# Patient Record
Sex: Male | Born: 1935
Health system: Southern US, Community
[De-identification: ages and names within clinical notes are randomized; demographics above are authoritative.]

## PROBLEM LIST (undated history)

## (undated) DIAGNOSIS — D649 Anemia, unspecified: Secondary | ICD-10-CM

## (undated) DIAGNOSIS — M1990 Unspecified osteoarthritis, unspecified site: Secondary | ICD-10-CM

## (undated) DIAGNOSIS — I10 Essential (primary) hypertension: Secondary | ICD-10-CM

## (undated) DIAGNOSIS — K589 Irritable bowel syndrome without diarrhea: Secondary | ICD-10-CM

## (undated) DIAGNOSIS — I272 Pulmonary hypertension, unspecified: Secondary | ICD-10-CM

## (undated) DIAGNOSIS — N184 Chronic kidney disease, stage 4 (severe): Secondary | ICD-10-CM

## (undated) DIAGNOSIS — E7800 Pure hypercholesterolemia, unspecified: Secondary | ICD-10-CM

## (undated) DIAGNOSIS — J189 Pneumonia, unspecified organism: Secondary | ICD-10-CM

## (undated) DIAGNOSIS — N189 Chronic kidney disease, unspecified: Secondary | ICD-10-CM

## (undated) DIAGNOSIS — R001 Bradycardia, unspecified: Secondary | ICD-10-CM

## (undated) DIAGNOSIS — I739 Peripheral vascular disease, unspecified: Secondary | ICD-10-CM

## (undated) DIAGNOSIS — R55 Syncope and collapse: Secondary | ICD-10-CM

## (undated) DIAGNOSIS — C61 Malignant neoplasm of prostate: Secondary | ICD-10-CM

## (undated) DIAGNOSIS — K219 Gastro-esophageal reflux disease without esophagitis: Secondary | ICD-10-CM

## (undated) HISTORY — DX: Bradycardia, unspecified: R00.1

## (undated) HISTORY — PX: COLONOSCOPY: SHX174

## (undated) HISTORY — DX: Gastro-esophageal reflux disease without esophagitis: K21.9

## (undated) HISTORY — DX: Peripheral vascular disease, unspecified: I73.9

## (undated) HISTORY — PX: PROSTATE SURGERY: SHX751

## (undated) HISTORY — PX: PROSTATECTOMY: SHX69

## (undated) HISTORY — DX: Irritable bowel syndrome, unspecified: K58.9

## (undated) HISTORY — DX: Malignant neoplasm of prostate: C61

## (undated) HISTORY — DX: Chronic kidney disease, unspecified: N18.9

## (undated) HISTORY — DX: Anemia, unspecified: D64.9

## (undated) HISTORY — DX: Pulmonary hypertension, unspecified: I27.20

---

## 2005-11-18 ENCOUNTER — Ambulatory Visit: Payer: Self-pay | Admitting: Family Medicine

## 2007-05-25 ENCOUNTER — Ambulatory Visit: Payer: Self-pay | Admitting: Family Medicine

## 2010-02-07 ENCOUNTER — Ambulatory Visit: Payer: Self-pay | Admitting: Gastroenterology

## 2010-04-12 ENCOUNTER — Ambulatory Visit: Payer: Self-pay | Admitting: Family Medicine

## 2010-04-17 ENCOUNTER — Ambulatory Visit: Payer: Self-pay | Admitting: Family Medicine

## 2010-05-21 ENCOUNTER — Ambulatory Visit: Payer: Self-pay | Admitting: Gastroenterology

## 2011-12-12 ENCOUNTER — Other Ambulatory Visit: Payer: Self-pay

## 2011-12-12 ENCOUNTER — Emergency Department (INDEPENDENT_AMBULATORY_CARE_PROVIDER_SITE_OTHER): Payer: BC Managed Care – PPO

## 2011-12-12 ENCOUNTER — Emergency Department (INDEPENDENT_AMBULATORY_CARE_PROVIDER_SITE_OTHER)
Admission: EM | Admit: 2011-12-12 | Discharge: 2011-12-12 | Disposition: A | Discharge status: Home / Self Care | Payer: BC Managed Care – PPO | Source: Home / Self Care | Attending: Emergency Medicine | Admitting: Emergency Medicine

## 2011-12-12 ENCOUNTER — Encounter (HOSPITAL_COMMUNITY): Payer: Self-pay | Admitting: Emergency Medicine

## 2011-12-12 DIAGNOSIS — J189 Pneumonia, unspecified organism: Secondary | ICD-10-CM

## 2011-12-12 DIAGNOSIS — D649 Anemia, unspecified: Secondary | ICD-10-CM

## 2011-12-12 DIAGNOSIS — R55 Syncope and collapse: Secondary | ICD-10-CM

## 2011-12-12 DIAGNOSIS — N189 Chronic kidney disease, unspecified: Secondary | ICD-10-CM

## 2011-12-12 DIAGNOSIS — I498 Other specified cardiac arrhythmias: Secondary | ICD-10-CM

## 2011-12-12 DIAGNOSIS — R001 Bradycardia, unspecified: Secondary | ICD-10-CM

## 2011-12-12 HISTORY — DX: Pure hypercholesterolemia, unspecified: E78.00

## 2011-12-12 HISTORY — DX: Essential (primary) hypertension: I10

## 2011-12-12 LAB — POCT I-STAT, CHEM 8
BUN: 38 mg/dL — ABNORMAL HIGH (ref 6–23)
Calcium, Ion: 1.2 mmol/L (ref 1.12–1.32)
TCO2: 23 mmol/L (ref 0–100)

## 2011-12-12 MED ORDER — BENZONATATE 200 MG PO CAPS: 200.0000 mg | ORAL_CAPSULE | Freq: Three times a day (TID) | ORAL | Status: AC | PRN

## 2011-12-12 MED ORDER — CEFTRIAXONE SODIUM 1 G IJ SOLR
INTRAMUSCULAR | Status: AC
  Filled 2011-12-12: qty 10

## 2011-12-12 MED ORDER — AMOXICILLIN-POT CLAVULANATE 875-125 MG PO TABS: 1.0000 | ORAL_TABLET | Freq: Two times a day (BID) | ORAL | Status: AC

## 2011-12-12 MED ORDER — CEFTRIAXONE SODIUM 1 G IJ SOLR
1.0000 g | Freq: Once | INTRAMUSCULAR | Status: AC
  Administered 2011-12-12: 1 g via INTRAMUSCULAR

## 2011-12-12 MED ORDER — LIDOCAINE HCL (PF) 1 % IJ SOLN
INTRAMUSCULAR | Status: AC
  Filled 2011-12-12: qty 5

## 2011-12-12 NOTE — ED Notes (Signed)
Patient aware of post injection delay prior to being discharged, evaluation for any reaction

## 2011-12-12 NOTE — ED Notes (Signed)
C/o congestion in chest, throat, no sore throat, congestion is clear, today has been blood tinge.  In am has had yellow phlegm, then clears throughout the day.  No fever, maybe Saturday and sunday

## 2011-12-12 NOTE — Discharge Instructions (Signed)

## 2011-12-12 NOTE — ED Provider Notes (Signed)
Chief Complaint  Patient presents with  . URI    History of Present Illness:   Mr. Medendorp is a 78 year old male with high blood pressure and hyperlipidemia who has had a one-week history of cough productive of yellow sputum, rattling in his chest, wheezing, fever, chills, nasal congestion, rhinorrhea, sneezing, and yellowish nasal drainage. He has some nausea and vomiting over the weekend and actually on Sunday, 4 days ago, he passed out briefly just for a few seconds. He felt dizzy beforehand. He did not hurt himself when he fell. He denies any other prior history of syncope or presyncope. He denied any accompanying chest pain, tightness, pressure, palpitations, or shortness of breath. He denies any chest pain or difficulty breathing right now. He has no pleuritic chest pain. No prior history of pneumonia.  Review of Systems:  Other than noted above, the patient denies any of the following symptoms. Systemic:  No fever, chills, sweats, fatigue, myalgias, headache, or anorexia. Eye:  No redness, pain or drainage. ENT:  No earache, nasal congestion, rhinorrhea, sinus pressure, or sore throat. Lungs:  No cough, sputum production, wheezing, shortness of breath. Or chest pain. GI:  No nausea, vomiting, abdominal pain or diarrhea. Skin:  No rash or itching.  [place]:  Past medical history, family history, social history, meds, and allergies were reviewed.  Physical Exam:   Vital signs:  BP 115/55  Pulse 64  Temp(Src) 98.4 F (36.9 C) (Oral)  Resp 16  SpO2 99% General:  Alert, in no distress. Eye:  No conjunctival injection or drainage. ENT:  TMs and canals were normal, without erythema or inflammation.  Nasal mucosa was clear and uncongested, without drainage.  Mucous membranes were moist.  Pharynx was clear, without exudate or drainage.  There were no oral ulcerations or lesions. Neck:  Supple, no adenopathy, tenderness or mass. Lungs:  No respiratory distress.  Lungs were clear to  auscultation, without wheezes, rales or rhonchi.  Breath sounds were clear and equal bilaterally. Heart:  Regular rhythm, without gallops, murmers or rubs. Skin:  Clear, warm, and dry, without rash or lesions.  Labs:   Results for orders placed during the hospital encounter of 12/12/11  POCT I-STAT, CHEM 8      Component Value Range   Sodium 141  135 - 145 (mEq/L)   Potassium 4.0  3.5 - 5.1 (mEq/L)   Chloride 110  96 - 112 (mEq/L)   BUN 38 (*) 6 - 23 (mg/dL)   Creatinine, Ser 2.00 (*) 0.50 - 1.35 (mg/dL)   Glucose, Bld 138 (*) 70 - 99 (mg/dL)   Calcium, Ion 1.20  1.12 - 1.32 (mmol/L)   TCO2 23  0 - 100 (mmol/L)   Hemoglobin 10.5 (*) 13.0 - 17.0 (g/dL)   HCT 31.0 (*) 39.0 - 52.0 (%)    Date: 12/12/2011  Rate: 47  Rhythm: sinus bradycardia  QRS Axis: normal  Intervals: normal  ST/T Wave abnormalities: normal  Conduction Disutrbances:none  Narrative Interpretation: Sinus bradycardia, otherwise normal EKG.  Old EKG Reviewed: none available    Radiology:  Dg Chest 2 View  12/12/2011  *RADIOLOGY REPORT*  Clinical Data: Cough for 1 week and nonsmoker  CHEST - 2 VIEW  Comparison: None.  Findings: The lung fields are mildly hyperexpanded with flattening of the hemidiaphragms and increased retrosternal air space suggesting some degree of obstructive pulmonary disease.  Taking this into consideration heart size is mildly enlarged.  A normal mediastinal configuration is seen.  The lung fields are notable  for an area of alveolar infiltrate involving the superior segment of the left lower lobe.  The finding is most suspicious for an area of focal bronchopneumonia but given the somewhat rounded nature on the lateral film, follow-up post therapeutically is recommended to assess for resolution.  Remainder of the lung fields are clear.  No pleural fluid is seen.   Bony structures appear intact.  IMPRESSION: Findings most compatible with a superior segment left lower lobe bronchopneumonia.  Follow-up to  clearing is recommended as noted above.  Underlying COPD suggested.  These results will be called to the ordering clinician or representative by the Radiologist Assistant, and communication documented in the PACS Dashboard.  Original Report Authenticated By: Ander Gaster, M.D.    Assessment:  The primary encounter diagnosis was Community acquired pneumonia. Diagnoses of Syncope, Bradycardia, Chronic kidney disease, and Anemia were also pertinent to this visit.  Plan:   1.  The following meds were prescribed:   New Prescriptions   AMOXICILLIN-CLAVULANATE (AUGMENTIN) 875-125 MG PER TABLET    Take 1 tablet by mouth 2 (two) times daily.   BENZONATATE (TESSALON) 200 MG CAPSULE    Take 1 capsule (200 mg total) by mouth 3 (three) times daily as needed for cough.   2.  The patient was instructed in symptomatic care and handouts were given. 3.  The patient was told to return if becoming worse in any way, if no better in 3 or 4 days, and given some red flag symptoms that would indicate earlier return.  Follow up:  The patient was told to follow up with his primary care physician next week. No work until then. I suggested that he will want to have a repeat chest x-ray in 2-4 weeks to confirm clearing of the pneumonia.    Harden Mo, MD 12/12/11 [phone]

## 2012-06-29 DIAGNOSIS — C61 Malignant neoplasm of prostate: Secondary | ICD-10-CM | POA: Insufficient documentation

## 2012-06-29 DIAGNOSIS — N393 Stress incontinence (female) (male): Secondary | ICD-10-CM | POA: Insufficient documentation

## 2012-06-29 DIAGNOSIS — N529 Male erectile dysfunction, unspecified: Secondary | ICD-10-CM | POA: Insufficient documentation

## 2012-07-06 ENCOUNTER — Other Ambulatory Visit: Payer: Self-pay | Admitting: Orthopedic Surgery

## 2012-07-06 DIAGNOSIS — M4800 Spinal stenosis, site unspecified: Secondary | ICD-10-CM

## 2012-08-07 ENCOUNTER — Other Ambulatory Visit: Payer: BC Managed Care – PPO

## 2012-08-26 ENCOUNTER — Ambulatory Visit
Admission: RE | Admit: 2012-08-26 | Discharge: 2012-08-26 | Disposition: A | Discharge status: Home / Self Care | Payer: Medicare Other | Source: Ambulatory Visit | Attending: Orthopedic Surgery | Admitting: Orthopedic Surgery

## 2012-08-26 VITALS — BP 157/72 | HR 46 | Ht 74.0 in | Wt 219.0 lb

## 2012-08-26 DIAGNOSIS — M4800 Spinal stenosis, site unspecified: Secondary | ICD-10-CM

## 2012-08-26 MED ORDER — IOHEXOL 180 MG/ML  SOLN
15.0000 mL | Freq: Once | INTRAMUSCULAR | Status: AC | PRN
  Administered 2012-08-26: 15 mL via INTRATHECAL

## 2012-08-26 MED ORDER — DIAZEPAM 5 MG PO TABS
5.0000 mg | ORAL_TABLET | Freq: Once | ORAL | Status: AC
  Administered 2012-08-26: 5 mg via ORAL

## 2012-10-29 ENCOUNTER — Other Ambulatory Visit: Payer: Self-pay | Admitting: Family Medicine

## 2012-11-09 ENCOUNTER — Ambulatory Visit
Admission: RE | Admit: 2012-11-09 | Discharge: 2012-11-09 | Disposition: A | Discharge status: Home / Self Care | Payer: Medicare Other | Source: Ambulatory Visit | Attending: Family Medicine | Admitting: Family Medicine

## 2013-05-07 ENCOUNTER — Ambulatory Visit: Payer: Self-pay | Admitting: Family Medicine

## 2013-06-29 ENCOUNTER — Ambulatory Visit: Payer: Medicare Other

## 2013-08-27 ENCOUNTER — Ambulatory Visit: Payer: Self-pay

## 2014-07-29 ENCOUNTER — Encounter: Payer: Self-pay | Admitting: Cardiology

## 2014-07-29 ENCOUNTER — Ambulatory Visit (INDEPENDENT_AMBULATORY_CARE_PROVIDER_SITE_OTHER): Payer: Medicare Other | Admitting: Cardiology

## 2014-07-29 VITALS — BP 140/90 | HR 52 | Ht 74.0 in | Wt 210.4 lb

## 2014-07-29 DIAGNOSIS — R001 Bradycardia, unspecified: Secondary | ICD-10-CM | POA: Insufficient documentation

## 2014-07-29 DIAGNOSIS — I739 Peripheral vascular disease, unspecified: Secondary | ICD-10-CM | POA: Insufficient documentation

## 2014-07-29 DIAGNOSIS — D638 Anemia in other chronic diseases classified elsewhere: Secondary | ICD-10-CM | POA: Insufficient documentation

## 2014-07-29 DIAGNOSIS — K589 Irritable bowel syndrome without diarrhea: Secondary | ICD-10-CM | POA: Insufficient documentation

## 2014-07-29 DIAGNOSIS — I10 Essential (primary) hypertension: Secondary | ICD-10-CM

## 2014-07-29 DIAGNOSIS — E785 Hyperlipidemia, unspecified: Secondary | ICD-10-CM

## 2014-07-29 DIAGNOSIS — D649 Anemia, unspecified: Secondary | ICD-10-CM | POA: Insufficient documentation

## 2014-07-29 DIAGNOSIS — K219 Gastro-esophageal reflux disease without esophagitis: Secondary | ICD-10-CM | POA: Insufficient documentation

## 2014-07-29 DIAGNOSIS — R9431 Abnormal electrocardiogram [ECG] [EKG]: Secondary | ICD-10-CM

## 2014-07-29 MED ORDER — HYDRALAZINE HCL 25 MG PO TABS: 25.0000 mg | ORAL_TABLET | Freq: Three times a day (TID) | ORAL | Status: DC

## 2014-07-29 NOTE — Addendum Note (Signed)
Addended by: Fransico Him R on: 07/29/2014 11:57 AM   Modules accepted: Level of Service

## 2014-07-29 NOTE — Progress Notes (Signed)
[place], [place],   [address] Phone: [phone] Fax:  [phone]  Date:  07/29/2014   ID:  Patrick Wisely., DOB 11-Sep-1931, MRN [id number]  PCP:  Christianne Borrow, MD  Cardiologist:  Fransico Him, MD    History of Present Illness: Patrick [name]. is a 80 y.o. male with a history of HTN, PVD, CKD, GERD and dyslipidemia who was referred by his PCP for evaluation of difficult to control HTN.  He denies any chest pain, SOB, DOE, LE edema, dizziness, palpitations or syncope.   Wt Readings from Last 3 Encounters:  07/29/14 210 lb 6.4 oz (95.437 kg)  08/26/12 219 lb (99.338 kg)     Past Medical History  Diagnosis Date  . Hypertension   . High cholesterol   . Prostate cancer   . PVD (peripheral vascular disease)   . Chronic kidney disease   . GERD (gastroesophageal reflux disease)   . Anemia   . IBS (irritable bowel syndrome)   . Bradycardia     Current Outpatient Prescriptions  Medication Sig Dispense Refill  . amLODipine (NORVASC) 10 MG tablet Take 10 mg by mouth daily.    . hydrALAZINE (APRESOLINE) 10 MG tablet Take 10 mg by mouth 3 (three) times daily.    [name] Kitchen losartan (COZAAR) 100 MG tablet Take 100 mg by mouth daily.    [name] Kitchen omeprazole (PRILOSEC) 40 MG capsule Take 40 mg by mouth daily. [place]    . OVER THE COUNTER MEDICATION [place] MAN VITAMIN B  AND D3    . pravastatin (PRAVACHOL) 40 MG tablet Take 40 mg by mouth daily.    . vitamin C (ASCORBIC ACID) 500 MG tablet Take 500 mg by mouth daily.    . Vitamin E 400 UNITS CHEW Chew by mouth.     No current facility-administered medications for this visit.    Allergies:   No Known Allergies  Social History:  The patient  reports that he quit smoking about 35 years ago. His smoking use included Cigarettes. He has a 6 pack-year smoking history. He has never used smokeless tobacco. He reports that he does not drink alcohol or use illicit drugs.   Family History:  The patient's  family history includes CVA in his mother; Heart attack in his father; Heart disease in his father; Hepatitis C in his sister.   ROS:  Please see the history of present illness.      All other systems reviewed and negative.   PHYSICAL EXAM: VS:  BP 140/90 mmHg  Pulse 52  Ht 6\' 2"  (1.88 m)  Wt 210 lb 6.4 oz (95.437 kg)  BMI 27.00 kg/m2 Well nourished, well developed, in no acute distress HEENT: normal Neck: no JVD Cardiac:  normal S1, S2; RRR; no murmur Lungs:  clear to auscultation bilaterally, no wheezing, rhonchi or rales Abd: soft, nontender, no hepatomegaly Ext: no edema Skin: warm and dry Neuro:  CNs 2-12 intact, no focal abnormalities noted  EKG:  Sinus bradycardia at 52bpm with T wave inversions in V1-V3     ASSESSMENT AND PLAN:  1. Abnormal EKG with T wave changes in anteoseptal leads - Stress myoview to rule out ischemia - 2D echo to assess LVF 2. HTN with borderline control - continue amlodipine/Cozaar - increase Hydralazine to 25mg  TID - I have asked him to check his BP daily for a week and call with results - I have counseled him on a low sodium diet  3. Dyslipidemia - continue pravastatin 4. PVD 5. CKD - his last creatinine was 1.9 - will leave up to PCP but may need to change this to something else.  Followup with PA in 2 weeks  Signed, Fransico Him, MD [facility] 07/29/2014 10:17 AM

## 2014-07-29 NOTE — Patient Instructions (Addendum)
Your physician has requested that you have an echocardiogram. Echocardiography is a painless test that uses sound waves to create images of your heart. It provides your doctor with information about the size and shape of your heart and how well your heart's chambers and valves are working. This procedure takes approximately one hour. There are no restrictions for this procedure.  Your physician has recommended you make the following change in your medication:  1) INCREASE Hydralazine to 25 mg THREE TIMES A DAY  Please check your blood pressure daily for one week and call us with results. [id number].  Dr. Radford Pax recommends having a Stress Myoview.  You have an appointment with a nurse on November 27th to check your blood pressure.  Your physician recommends that you schedule a follow-up appointment in: 3 months with Dr. Radford Pax.   Low-Sodium Eating Plan Sodium raises blood pressure and causes water to be held in the body. Getting less sodium from food will help lower your blood pressure, reduce any swelling, and protect your heart, liver, and kidneys. We get sodium by adding salt (sodium chloride) to food. Most of our sodium comes from canned, boxed, and frozen foods. Restaurant foods, fast foods, and pizza are also very high in sodium. Even if you take medicine to lower your blood pressure or to reduce fluid in your body, getting less sodium from your food is important. WHAT DO I NEED TO KNOW ABOUT THIS EATING PLAN? For the low-sodium eating plan, you will follow these general guidelines:  Choose foods with a % Daily Value for sodium of less than 5% (as listed on the food label).   Use salt-free seasonings or herbs instead of table salt or sea salt.   Check with your health care provider or pharmacist before using salt substitutes.   Eat fresh foods.  Eat more vegetables and fruits.  Limit canned vegetables. If you do use them, rinse them well to decrease the sodium.   Limit cheese to  1 oz (28 g) per day.   Eat lower-sodium products, often labeled as "lower sodium" or "no salt added."  Avoid foods that contain monosodium glutamate (MSG). MSG is sometimes added to [place] food and some canned foods.  Check food labels (Nutrition Facts labels) on foods to learn how much sodium is in one serving.  Eat more home-cooked food and less restaurant, buffet, and fast food.  When eating at a restaurant, ask that your food be prepared with less salt or none, if possible.  HOW DO I READ FOOD LABELS FOR SODIUM INFORMATION? The Nutrition Facts label lists the amount of sodium in one serving of the food. If you eat more than one serving, you must multiply the listed amount of sodium by the number of servings. Food labels may also identify foods as:  Sodium free--Less than 5 mg in a serving.  Very low sodium--35 mg or less in a serving.  Low sodium--140 mg or less in a serving.  Light in sodium--50% less sodium in a serving. For example, if a food that usually has 300 mg of sodium is changed to become light in sodium, it will have 150 mg of sodium.  Reduced sodium--25% less sodium in a serving. For example, if a food that usually has 400 mg of sodium is changed to reduced sodium, it will have 300 mg of sodium. WHAT FOODS CAN I EAT? Grains Low-sodium cereals, including oats, puffed wheat and rice, and shredded wheat cereals. Low-sodium crackers. Unsalted rice and pasta. Lower-sodium  bread.  Vegetables Frozen or fresh vegetables. Low-sodium or reduced-sodium canned vegetables. Low-sodium or reduced-sodium tomato sauce and paste. Low-sodium or reduced-sodium tomato and vegetable juices.  Fruits Fresh, frozen, and canned fruit. Fruit juice.  Meat and Other Protein Products Low-sodium canned tuna and salmon. Fresh or frozen meat, poultry, seafood, and fish. Lamb. Unsalted nuts. Dried beans, peas, and lentils without added salt. Unsalted canned beans. Homemade soups without  salt. Eggs.  Dairy Milk. Soy milk. Ricotta cheese. Low-sodium or reduced-sodium cheeses. Yogurt.  Condiments Fresh and dried herbs and spices. Salt-free seasonings. Onion and garlic powders. Low-sodium varieties of mustard and ketchup. Lemon juice.  Fats and Oils Reduced-sodium salad dressings. Unsalted butter.  Other Unsalted popcorn and pretzels.  The items listed above may not be a complete list of recommended foods or beverages. Contact your dietitian for more options. WHAT FOODS ARE NOT RECOMMENDED? Grains Instant hot cereals. Bread stuffing, pancake, and biscuit mixes. Croutons. Seasoned rice or pasta mixes. Noodle soup cups. Boxed or frozen macaroni and cheese. Self-rising flour. Regular salted crackers. Vegetables Regular canned vegetables. Regular canned tomato sauce and paste. Regular tomato and vegetable juices. Frozen vegetables in sauces. Salted french fries. Olives. Angie Fava. Relishes. Sauerkraut. Salsa. Meat and Other Protein Products Salted, canned, smoked, spiced, or pickled meats, seafood, or fish. Bacon, ham, sausage, hot dogs, corned beef, chipped beef, and packaged luncheon meats. Salt pork. Jerky. Pickled herring. Anchovies, regular canned tuna, and sardines. Salted nuts. Dairy Processed cheese and cheese spreads. Cheese curds. Blue cheese and cottage cheese. Buttermilk.  Condiments Onion and garlic salt, seasoned salt, table salt, and sea salt. Canned and packaged gravies. Worcestershire sauce. Tartar sauce. Barbecue sauce. Teriyaki sauce. Soy sauce, including reduced sodium. Steak sauce. Fish sauce. Oyster sauce. Cocktail sauce. Horseradish. Regular ketchup and mustard. Meat flavorings and tenderizers. Bouillon cubes. Hot sauce. Tabasco sauce. Marinades. Taco seasonings. Relishes. Fats and Oils Regular salad dressings. Salted butter. Margarine. Ghee. Bacon fat.  Other Potato and tortilla chips. Corn chips and puffs. Salted popcorn and pretzels. Canned or  dried soups. Pizza. Frozen entrees and pot pies.  The items listed above may not be a complete list of foods and beverages to avoid. Contact your dietitian for more information. Document Released: 02/15/2002 Document Revised: 08/31/2013 Document Reviewed: 06/30/2013 [facility] Patient Information 2015 [place], [place]. This information is not intended to replace advice given to you by your health care provider. Make sure you discuss any questions you have with your health care provider.

## 2014-08-15 ENCOUNTER — Ambulatory Visit (HOSPITAL_COMMUNITY): Payer: Medicare Other | Attending: Cardiology | Admitting: Radiology

## 2014-08-15 ENCOUNTER — Ambulatory Visit (HOSPITAL_BASED_OUTPATIENT_CLINIC_OR_DEPARTMENT_OTHER): Payer: Medicare Other

## 2014-08-15 DIAGNOSIS — Z6826 Body mass index (BMI) 26.0-26.9, adult: Secondary | ICD-10-CM | POA: Insufficient documentation

## 2014-08-15 DIAGNOSIS — I10 Essential (primary) hypertension: Secondary | ICD-10-CM | POA: Insufficient documentation

## 2014-08-15 DIAGNOSIS — R9431 Abnormal electrocardiogram [ECG] [EKG]: Secondary | ICD-10-CM | POA: Diagnosis not present

## 2014-08-15 MED ORDER — TECHNETIUM TC 99M SESTAMIBI GENERIC - CARDIOLITE
10.0000 | Freq: Once | INTRAVENOUS | Status: AC | PRN
  Administered 2014-08-15: 10 via INTRAVENOUS

## 2014-08-15 MED ORDER — TECHNETIUM TC 99M SESTAMIBI GENERIC - CARDIOLITE
30.0000 | Freq: Once | INTRAVENOUS | Status: AC | PRN
  Administered 2014-08-15: 30 via INTRAVENOUS

## 2014-08-15 NOTE — Progress Notes (Signed)
[place] [facility] NUCLEAR MED [address] [place], [place] [address] [phone]    Cardiology Nuclear Med [facility] P Cope Benzon. is a 80 y.o. male     MRN : [id number]     DOB: 29-Sep-1931  Procedure Date: 08/15/2014  Nuclear Med Background Indication for Stress Test:  Evaluation for Ischemia and Abnormal EKG History:  no prior cardiac history or testing Cardiac Risk Factors: Hypertension  Symptoms:  None indicated   Nuclear Pre-Procedure Caffeine/Decaff Intake:  None NPO After: 6:00pm   Lungs:  clear O2 Sat: 97% on room air. IV 0.9% NS with Angio Cath:  22g  IV Site: R Hand  IV Started by:  Earl Many, CNMT  Chest Size (in):  48 Cup Size: n/a  Height: 6\' 2"  (1.88 m)  Weight:  210 lb (95.255 kg)  BMI:  Body mass index is 26.95 kg/(m^2). Tech Comments:  n/a    Nuclear Med Study 1 or 2 day study: 1 day  Stress Test Type:  Stress  Reading MD: Darlin Coco, MD  Order Authorizing Provider:  T. Turner, MD  Resting Radionuclide: Technetium 56m Sestamibi  Resting Radionuclide Dose: 11.0 mCi   Stress Radionuclide:  Technetium 18m Sestamibi  Stress Radionuclide Dose: 33.0 mCi           Stress Protocol Rest HR: 91 Stress HR: 146  Rest BP: 142/98 Stress BP: 213/90  Exercise Time (min): 6:00 METS: 4.6   Predicted Max HR: 140 bpm % Max HR: 104.29 bpm Rate Pressure Product: 31098   Dose of Adenosine (mg):  n/a Dose of Lexiscan: n/a mg  Dose of Atropine (mg): n/a Dose of Dobutamine: n/a mcg/kg/min (at max HR)  Stress Test Technologist: Glade Lloyd, BS-ES  Nuclear Technologist:  Annye Rusk, CNMT     Rest Procedure:  Myocardial perfusion imaging was performed at rest 45 minutes following the intravenous administration of Technetium 72m Sestamibi. Rest ECG: NSR - Normal EKG  Stress Procedure:  The patient exercised on the treadmill utilizing the Bruce Protocol for 6:00 minutes. The patient stopped due to fatigue and denied any chest pain.  Technetium  40m Sestamibi was injected at peak exercise and myocardial perfusion imaging was performed after a brief delay. Stress ECG: No significant change from baseline ECG  QPS Raw Data Images:  Normal; no motion artifact; normal heart/lung ratio. Stress Images:  Normal homogeneous uptake in all areas of the myocardium. Rest Images:  Normal homogeneous uptake in all areas of the myocardium. Subtraction (SDS):  No evidence of ischemia. Transient Ischemic Dilatation (Normal <1.22):  0.93 Lung/Heart Ratio (Normal <0.45):  0.26  Quantitative Gated Spect Images QGS EDV:  119 ml QGS ESV:  52 ml  Impression Exercise Capacity:  Good exercise capacity. BP Response:  Hypertensive blood pressure response. Clinical Symptoms:  No significant symptoms noted. ECG Impression:  No significant ST segment change suggestive of ischemia. Comparison with Prior Nuclear Study: No previous nuclear study performed  Overall Impression:  Low risk stress nuclear study. Hypertensive response to exercise. No ischemia or scar. No EKG changes of ischemia. Normal LV systolic function.  LV Ejection Fraction: 56%.  LV Wall Motion:  NL LV Function; NL Wall Motion  Darlin Coco MD

## 2014-08-15 NOTE — Progress Notes (Signed)
2D Echo completed. 08/15/2014

## 2014-08-16 ENCOUNTER — Telehealth: Payer: Self-pay | Admitting: Cardiology

## 2014-08-16 NOTE — Telephone Encounter (Signed)
Patient had elevated BP at time of stress test with BP ranging from 169-211/90's - please find out if patient took his BP meds the am of his stress test

## 2014-08-18 NOTE — Telephone Encounter (Signed)
Pt wife st he took all his medications before his stress test. She st that today, his BP was 141/81. She st he might have been nervous before the test.   To Dr. Radford Pax for review.

## 2014-08-19 NOTE — Telephone Encounter (Signed)
Instructed to check BP daily for a week and call with results.  Pt's wife agrees with treatment plan.

## 2014-08-19 NOTE — Telephone Encounter (Signed)
Please have patient check BP daily for a week and call with results 

## 2014-08-26 ENCOUNTER — Telehealth: Payer: Self-pay | Admitting: Cardiology

## 2014-08-26 NOTE — Telephone Encounter (Signed)
WAS TOLD BY KATIE TO CALL WITH BP READING FROM THE LAST 5 DAYS, THEY ARE AS FOLLOWS 12-11 160/97 12-12 165/88 12-13 111/73 12-14 155/81 12-15 169/87 12-16 148/83 12-17 157/87 12-18 135/84

## 2014-08-26 NOTE — Telephone Encounter (Signed)
To Dr. Turner for review and recommendations. 

## 2014-08-30 MED ORDER — HYDRALAZINE HCL 25 MG PO TABS: 37.5000 mg | ORAL_TABLET | Freq: Three times a day (TID) | ORAL | Status: DC

## 2014-08-30 NOTE — Telephone Encounter (Signed)
BP still on the high side - Increase Hydralazine to 37.5mg  TID and check BP daily for a week and call with results.

## 2014-08-30 NOTE — Telephone Encounter (Signed)
With patient's permission, spoke to his wife about BP and new medication instructions. Instructed her to INCREASE hydralazine to 37.5 mg TID and to check BP daily for a week and call with results. She and patient agree with treatment plan.

## 2014-10-31 ENCOUNTER — Encounter: Payer: Self-pay | Admitting: Cardiology

## 2014-10-31 ENCOUNTER — Ambulatory Visit (INDEPENDENT_AMBULATORY_CARE_PROVIDER_SITE_OTHER): Payer: Medicare HMO | Admitting: Cardiology

## 2014-10-31 VITALS — BP 138/72 | HR 70 | Ht 74.0 in | Wt 209.8 lb

## 2014-10-31 DIAGNOSIS — I10 Essential (primary) hypertension: Secondary | ICD-10-CM | POA: Diagnosis not present

## 2014-10-31 DIAGNOSIS — I272 Pulmonary hypertension, unspecified: Secondary | ICD-10-CM

## 2014-10-31 DIAGNOSIS — I270 Primary pulmonary hypertension: Secondary | ICD-10-CM | POA: Diagnosis not present

## 2014-10-31 DIAGNOSIS — N189 Chronic kidney disease, unspecified: Secondary | ICD-10-CM

## 2014-10-31 NOTE — Patient Instructions (Addendum)
Your physician has recommended that you have a pulmonary function test. Pulmonary Function Tests are a group of tests that measure how well air moves in and out of your lungs.  Dr. Radford Pax recommends you have a VQ Scan. A chest X-ray has also been ordered.  Your physician wants you to follow-up in: 6 months with Dr. Radford Pax. You will receive a reminder letter in the mail two months in advance. If you don't receive a letter, please call our office to schedule the follow-up appointment.

## 2014-10-31 NOTE — Progress Notes (Signed)
Cardiology Office Note   Date:  10/31/2014   ID:  Patrick Egleston., DOB July 11, 1932, MRN [id number]  PCP:  Christianne Borrow, MD  Cardiologist:   Sueanne Margarita, MD   Chief Complaint  Patient presents with  . Hypertension      History of Present Illness: Patrick Burruel. is a 81 y.o. male with a history of HTN, PVD, CKD, GERD and dyslipidemia who presents for followup of HTN. He denies any chest pain, SOB, DOE, LE edema, dizziness, palpitations or syncope.   Past Medical History  Diagnosis Date  . Hypertension   . High cholesterol   . Prostate cancer   . PVD (peripheral vascular disease)   . Chronic kidney disease   . GERD (gastroesophageal reflux disease)   . Anemia   . IBS (irritable bowel syndrome)   . Bradycardia     Past Surgical History  Procedure Laterality Date  . Prostatectomy       Current Outpatient Prescriptions  Medication Sig Dispense Refill  . amLODipine (NORVASC) 10 MG tablet Take 10 mg by mouth daily.    . hydrALAZINE (APRESOLINE) 25 MG tablet Take 1.5 tablets (37.5 mg total) by mouth 3 (three) times daily. 135 tablet 6  . losartan (COZAAR) 100 MG tablet Take 100 mg by mouth daily.    [name] Kitchen MAGNESIUM PO Take 1 capsule by mouth daily.    . pravastatin (PRAVACHOL) 40 MG tablet Take 40 mg by mouth daily.    . vitamin C (ASCORBIC ACID) 500 MG tablet Take 500 mg by mouth daily.    . Vitamin E 400 UNITS CHEW Chew 1 tablet by mouth daily.      No current facility-administered medications for this visit.    Allergies:   Review of patient's allergies indicates no known allergies.    Social History:  The patient  reports that he quit smoking about 36 years ago. His smoking use included Cigarettes. He has a 6 pack-year smoking history. He has never used smokeless tobacco. He reports that he does not drink alcohol or use illicit drugs.   Family History:  The patient's family history includes CVA in his mother; Heart attack in his father; Heart  disease in his father; Hepatitis C in his sister.    ROS:  Please see the history of present illness.   Otherwise, review of systems are positive for none.   All other systems are reviewed and negative.    PHYSICAL EXAM: VS:  BP 138/72 mmHg  Pulse 70  Ht 6\' 2"  (1.88 m)  Wt 209 lb 12.8 oz (95.165 kg)  BMI 26.93 kg/m2  SpO2 98% , BMI Body mass index is 26.93 kg/(m^2). GEN: Well nourished, well developed, in no acute distress HEENT: normal Neck: no JVD, carotid bruits, or masses Cardiac: RRR; no murmurs, rubs, or gallops,no edema  Respiratory:  clear to auscultation bilaterally, normal work of breathing GI: soft, nontender, nondistended, + BS MS: no deformity or atrophy Skin: warm and dry, no rash Neuro:  Strength and sensation are intact Psych: euthymic mood, full affect   EKG:  EKG is not ordered today.    Recent Labs: No results found for requested labs within last 365 days.    Lipid Panel No results found for: CHOL, TRIG, HDL, CHOLHDL, VLDL, LDLCALC, LDLDIRECT    Wt Readings from Last 3 Encounters:  10/31/14 209 lb 12.8 oz (95.165 kg)  08/15/14 210 lb (95.255 kg)  07/29/14 210 lb 6.4 oz (95.437 kg)  ASSESSMENT AND PLAN:  1. Abnormal EKG with T wave changes in anteoseptal leads - no ischemia on nuclear stress test and echo with normal LVF 2. HTN well controlled - continue amlodipine/Cozaar/hydralazine 3. Dyslipidemia - continue pravastatin 4. PVD 5. CKD - his last creatinine was 1.9 - will leave up to PCP but may need to change this to something else. 6. Pulmonary HTN - PFTs will get a VQ scan (pt has renal insuff)  to rule out chronic PE and PFTs with DLCO.  If these are ok then will get a sleep study.     Current medicines are reviewed at length with the patient today.  The patient does not have concerns regarding medicines.  The following changes have been made:  no change  Labs/ tests ordered today include: VQ scan and PFTs with DLCO      Disposition:   FU with me in 6 months   Signed, Sueanne Margarita, MD  10/31/2014 8:43 AM    [facility] [address], [place], [place]  [address] Phone: [phone]; Fax: [phone]

## 2014-11-02 ENCOUNTER — Ambulatory Visit (HOSPITAL_COMMUNITY)
Admission: RE | Admit: 2014-11-02 | Discharge: 2014-11-02 | Disposition: A | Discharge status: Home / Self Care | Payer: Medicare HMO | Source: Ambulatory Visit | Attending: Cardiology | Admitting: Cardiology

## 2014-11-02 ENCOUNTER — Inpatient Hospital Stay (HOSPITAL_COMMUNITY): Admission: RE | Admit: 2014-11-02 | Payer: Medicare Other | Source: Ambulatory Visit

## 2014-11-02 DIAGNOSIS — N189 Chronic kidney disease, unspecified: Secondary | ICD-10-CM | POA: Diagnosis present

## 2014-11-02 DIAGNOSIS — I272 Pulmonary hypertension, unspecified: Secondary | ICD-10-CM

## 2014-11-02 DIAGNOSIS — I270 Primary pulmonary hypertension: Secondary | ICD-10-CM | POA: Insufficient documentation

## 2014-11-02 MED ORDER — TECHNETIUM TO 99M ALBUMIN AGGREGATED
6.0000 | Freq: Once | INTRAVENOUS | Status: AC | PRN
  Administered 2014-11-02: 6 via INTRAVENOUS

## 2014-11-02 MED ORDER — TECHNETIUM TC 99M DIETHYLENETRIAME-PENTAACETIC ACID: 40.0000 | Freq: Once | INTRAVENOUS | Status: AC | PRN

## 2014-11-18 ENCOUNTER — Other Ambulatory Visit: Payer: Self-pay

## 2014-11-18 MED ORDER — HYDRALAZINE HCL 25 MG PO TABS: 37.5000 mg | ORAL_TABLET | Freq: Three times a day (TID) | ORAL | Status: DC

## 2015-02-02 ENCOUNTER — Encounter: Payer: Self-pay | Admitting: Cardiology

## 2015-02-08 DIAGNOSIS — N183 Chronic kidney disease, stage 3 (moderate): Secondary | ICD-10-CM | POA: Diagnosis not present

## 2015-02-08 DIAGNOSIS — N189 Chronic kidney disease, unspecified: Secondary | ICD-10-CM | POA: Diagnosis not present

## 2015-02-08 DIAGNOSIS — D631 Anemia in chronic kidney disease: Secondary | ICD-10-CM | POA: Diagnosis not present

## 2015-02-08 DIAGNOSIS — N2581 Secondary hyperparathyroidism of renal origin: Secondary | ICD-10-CM | POA: Diagnosis not present

## 2015-02-09 ENCOUNTER — Other Ambulatory Visit: Payer: Self-pay | Admitting: Nephrology

## 2015-02-09 DIAGNOSIS — N183 Chronic kidney disease, stage 3 unspecified: Secondary | ICD-10-CM

## 2015-02-14 ENCOUNTER — Other Ambulatory Visit: Payer: Medicare HMO

## 2015-02-25 DIAGNOSIS — I10 Essential (primary) hypertension: Secondary | ICD-10-CM | POA: Diagnosis not present

## 2015-02-25 DIAGNOSIS — Z8546 Personal history of malignant neoplasm of prostate: Secondary | ICD-10-CM | POA: Diagnosis not present

## 2015-02-25 DIAGNOSIS — E785 Hyperlipidemia, unspecified: Secondary | ICD-10-CM | POA: Diagnosis not present

## 2015-02-25 DIAGNOSIS — Z6825 Body mass index (BMI) 25.0-25.9, adult: Secondary | ICD-10-CM | POA: Diagnosis not present

## 2015-03-17 ENCOUNTER — Emergency Department (INDEPENDENT_AMBULATORY_CARE_PROVIDER_SITE_OTHER)
Admission: EM | Admit: 2015-03-17 | Discharge: 2015-03-17 | Disposition: A | Discharge status: Home / Self Care | Payer: Commercial Managed Care - HMO | Source: Home / Self Care

## 2015-03-17 ENCOUNTER — Encounter (HOSPITAL_COMMUNITY): Payer: Self-pay

## 2015-03-17 DIAGNOSIS — K529 Noninfective gastroenteritis and colitis, unspecified: Secondary | ICD-10-CM | POA: Diagnosis not present

## 2015-03-17 NOTE — Discharge Instructions (Signed)
Please take the Zofran every 8 hours for the next day.  Stay on clear liquids.  Also, take culturelle daily.  Return on Sunday if symptoms continue, tomorrow if you're getting worse.

## 2015-03-17 NOTE — ED Notes (Addendum)
C/o chest tight, vomiting . C/o sick since Monday after cookout

## 2015-03-17 NOTE — ED Provider Notes (Addendum)
CSN: [id number]     Arrival date & time 03/17/15  1822 History   First MD Initiated Contact with Patient 03/17/15 1842     Chief Complaint  Patient presents with  . Emesis  . Chest Pain   (Consider location/radiation/quality/duration/timing/severity/associated sxs/prior Treatment) Patient is a 81 y.o. male presenting with vomiting and chest pain. The history is provided by the patient and the spouse. No language interpreter was used.  Emesis Severity:  Moderate Duration:  4 days Timing:  Intermittent Quality:  Stomach contents Able to tolerate:  Liquids How soon after eating does vomiting occur:  5 hours Progression:  Partially resolved Chronicity:  New Recent urination:  Normal Relieved by:  Nothing Worsened by:  Nothing tried Ineffective treatments:  None tried Associated symptoms: diarrhea   Associated symptoms: no abdominal pain and no myalgias   Chest Pain Associated symptoms: fatigue and vomiting   Associated symptoms: no abdominal pain    81 yo man who works at [employer], accompanied by wife.  She was sick last weekend and he became sick with nausea and vomiting over the Fourth of July.  They had had a cookout one week ago. He is able to tolerate liquids.  Last vomiting at 4 am this morning but went to work and was able to eat a sandwich.  Mouth feels dry. Patient passed out 3 days ago at night. No syncope or dizziness since.  Diarrhea has cleared after initial illness Past Medical History  Diagnosis Date  . Hypertension   . High cholesterol   . Prostate cancer   . PVD (peripheral vascular disease)   . Chronic kidney disease   . GERD (gastroesophageal reflux disease)   . Anemia   . IBS (irritable bowel syndrome)   . Bradycardia    Past Surgical History  Procedure Laterality Date  . Prostatectomy     Family History  Problem Relation Age of Onset  . CVA Mother   . Heart attack Father   . Heart disease Father   . Hepatitis C Sister    History  Substance  Use Topics  . Smoking status: Former Smoker -- 0.30 packs/day for 20 years    Types: Cigarettes    Quit date: 08/26/1978  . Smokeless tobacco: Never Used  . Alcohol Use: No    Review of Systems  Constitutional: Positive for fatigue.  HENT: Negative.   Eyes: Negative.   Respiratory: Negative.   Cardiovascular: Positive for chest pain. Negative for leg swelling.       Chest pain described as chronic tightness after vomiting.  Gastrointestinal: Positive for vomiting and diarrhea. Negative for abdominal pain.  Endocrine: Negative.   Genitourinary: Negative.   Musculoskeletal: Negative.  Negative for myalgias.  Skin: Negative.   Allergic/Immunologic: Negative.   Neurological: Positive for syncope.  Psychiatric/Behavioral: Negative.     Allergies  Review of patient's allergies indicates no known allergies.  Home Medications   Prior to Admission medications   Medication Sig Start Date End Date Taking? Authorizing Provider  amLODipine (NORVASC) 10 MG tablet Take 10 mg by mouth daily.    Historical Provider, MD  hydrALAZINE (APRESOLINE) 25 MG tablet Take 1.5 tablets (37.5 mg total) by mouth 3 (three) times daily. 11/18/14   Sueanne Margarita, MD  losartan (COZAAR) 100 MG tablet Take 100 mg by mouth daily.    Historical Provider, MD  MAGNESIUM PO Take 1 capsule by mouth daily.    Historical Provider, MD  pravastatin (PRAVACHOL) 40 MG tablet Take  40 mg by mouth daily.    Historical Provider, MD  vitamin C (ASCORBIC ACID) 500 MG tablet Take 500 mg by mouth daily.    Historical Provider, MD  Vitamin E 400 UNITS CHEW Chew 1 tablet by mouth daily.     Historical Provider, MD   BP 142/80 mmHg  Pulse 75  Temp(Src) 98.4 F (36.9 C) (Oral)  Resp 13  SpO2 96% Physical Exam  Constitutional: He is oriented to person, place, and time. He appears well-developed and well-nourished.  HENT:  Head: Normocephalic and atraumatic.  Right Ear: External ear normal.  Left Ear: External ear normal.  Dry  mouth  Eyes: Conjunctivae and EOM are normal. Pupils are equal, round, and reactive to light.  Neck: Normal range of motion. Neck supple. No tracheal deviation present. No thyromegaly present.  Cardiovascular: Normal rate, regular rhythm, normal heart sounds and intact distal pulses.   Pulmonary/Chest: Effort normal and breath sounds normal.  Abdominal: Soft. He exhibits no mass. There is no tenderness. There is no rebound and no guarding.  Hyperactive BS  Musculoskeletal: Normal range of motion.  Lymphadenopathy:    He has no cervical adenopathy.  Neurological: He is alert and oriented to person, place, and time.  Skin: Skin is warm and dry.  Psychiatric: He has a normal mood and affect. His behavior is normal. Judgment and thought content normal.  Nursing note and vitals reviewed.   ED Course  Procedures (including critical care time) Labs Review Labs Reviewed - No data to display  Imaging Review No results found.  EKG reviewed:  NSR, no acute changes ED ECG REPORT   Date: 03/17/2015  Rate: 72  Rhythm: normal sinus rhythm  QRS Axis: normal  Intervals: normal  ST/T Wave abnormalities: normal  Conduction Disutrbances:nonspecific intraventricular conduction delay  Narrative Interpretation:   Old EKG Reviewed: none available  I have personally reviewed the EKG tracing and agree with the computerized printout as noted.  MDM  Acute gastroenteritis without orthostatic change.  Patient is tolerating clear liquids. His wife has Zofran and patient agrees to push fluids.  Robyn Haber, MD   Robyn Haber, MD 03/17/15 1906  Robyn Haber, MD 03/17/15 1910  Robyn Haber, MD 03/17/15 [place]

## 2015-03-19 ENCOUNTER — Encounter: Payer: Self-pay | Admitting: Family Medicine

## 2015-03-20 ENCOUNTER — Encounter: Payer: Self-pay | Admitting: Family Medicine

## 2015-03-20 DIAGNOSIS — Z8701 Personal history of pneumonia (recurrent): Secondary | ICD-10-CM | POA: Insufficient documentation

## 2015-03-20 DIAGNOSIS — N184 Chronic kidney disease, stage 4 (severe): Secondary | ICD-10-CM | POA: Insufficient documentation

## 2015-03-20 DIAGNOSIS — E8881 Metabolic syndrome: Secondary | ICD-10-CM | POA: Insufficient documentation

## 2015-03-20 DIAGNOSIS — M79606 Pain in leg, unspecified: Secondary | ICD-10-CM | POA: Insufficient documentation

## 2015-03-20 DIAGNOSIS — K449 Diaphragmatic hernia without obstruction or gangrene: Secondary | ICD-10-CM | POA: Insufficient documentation

## 2015-03-20 DIAGNOSIS — T50905A Adverse effect of unspecified drugs, medicaments and biological substances, initial encounter: Secondary | ICD-10-CM

## 2015-03-20 DIAGNOSIS — M810 Age-related osteoporosis without current pathological fracture: Secondary | ICD-10-CM | POA: Insufficient documentation

## 2015-03-20 DIAGNOSIS — I517 Cardiomegaly: Secondary | ICD-10-CM | POA: Insufficient documentation

## 2015-03-20 DIAGNOSIS — N281 Cyst of kidney, acquired: Secondary | ICD-10-CM | POA: Insufficient documentation

## 2015-03-20 DIAGNOSIS — E559 Vitamin D deficiency, unspecified: Secondary | ICD-10-CM | POA: Insufficient documentation

## 2015-03-20 DIAGNOSIS — N62 Hypertrophy of breast: Secondary | ICD-10-CM | POA: Insufficient documentation

## 2015-03-20 DIAGNOSIS — M48061 Spinal stenosis, lumbar region without neurogenic claudication: Secondary | ICD-10-CM | POA: Insufficient documentation

## 2015-03-20 DIAGNOSIS — G2581 Restless legs syndrome: Secondary | ICD-10-CM | POA: Insufficient documentation

## 2015-03-20 DIAGNOSIS — J309 Allergic rhinitis, unspecified: Secondary | ICD-10-CM | POA: Insufficient documentation

## 2015-03-20 DIAGNOSIS — I361 Nonrheumatic tricuspid (valve) insufficiency: Secondary | ICD-10-CM | POA: Insufficient documentation

## 2015-03-23 ENCOUNTER — Ambulatory Visit (INDEPENDENT_AMBULATORY_CARE_PROVIDER_SITE_OTHER): Payer: Commercial Managed Care - HMO | Admitting: Family Medicine

## 2015-03-23 ENCOUNTER — Encounter: Payer: Self-pay | Admitting: Family Medicine

## 2015-03-23 VITALS — BP 142/72 | HR 74 | Temp 98.0°F | Resp 16 | Ht 73.0 in | Wt 196.4 lb

## 2015-03-23 DIAGNOSIS — D638 Anemia in other chronic diseases classified elsewhere: Secondary | ICD-10-CM

## 2015-03-23 DIAGNOSIS — N183 Chronic kidney disease, stage 3 unspecified: Secondary | ICD-10-CM

## 2015-03-23 DIAGNOSIS — E785 Hyperlipidemia, unspecified: Secondary | ICD-10-CM

## 2015-03-23 DIAGNOSIS — Z23 Encounter for immunization: Secondary | ICD-10-CM | POA: Diagnosis not present

## 2015-03-23 DIAGNOSIS — I270 Primary pulmonary hypertension: Secondary | ICD-10-CM

## 2015-03-23 DIAGNOSIS — I10 Essential (primary) hypertension: Secondary | ICD-10-CM | POA: Diagnosis not present

## 2015-03-23 DIAGNOSIS — I272 Pulmonary hypertension, unspecified: Secondary | ICD-10-CM

## 2015-03-23 DIAGNOSIS — C61 Malignant neoplasm of prostate: Secondary | ICD-10-CM | POA: Diagnosis not present

## 2015-03-23 MED ORDER — LOSARTAN POTASSIUM 100 MG PO TABS: 100.0000 mg | ORAL_TABLET | Freq: Every day | ORAL | Status: DC

## 2015-03-23 MED ORDER — PRAVASTATIN SODIUM 40 MG PO TABS: 40.0000 mg | ORAL_TABLET | Freq: Every day | ORAL | Status: DC

## 2015-03-23 MED ORDER — AMLODIPINE BESYLATE 10 MG PO TABS: 10.0000 mg | ORAL_TABLET | Freq: Every day | ORAL | Status: DC

## 2015-03-23 NOTE — Progress Notes (Signed)
Name: Patrick [name].   MRN: [id number]    DOB: Mar 20, 1932   Date:03/23/2015       Progress Note  Subjective  Chief Complaint  Chief Complaint  Patient presents with  . Hypertension  . Hyperlipidemia    ocassionally leg cramps    HPI  HTN: taking bp medications, cardiologist increased Hydralazine to one and a half pills three times daily in January 2016 and bp is at goal.  He recently had an episode of hypotension (BP 106/74 ) secondary to gastroenteritis. He denies dizziness or chest pain or palpitation  Pulmonary Hypertension: discussed sleep study, but patient and wife refused today.  Hyperlipidemia: last lipid panel was 06/28/2014 and LDL was 118, taking Pravastatin and denies side effects  CKI III : he sees Dr. [employer] in [place], last labs done in March, showed anemia - mild - and secondary hyperparathyroidism, he also had high protein in urine.  He denies oliguria. He denies fatigue , no pruritus.   Prostate Cancer: he sees Urologist, Dr. Jacqlyn Larsen, no longer on therapy, he had a TURP procedure in 1999 and also used Lupron form about 10 years, but he as been off since 2013.     Patient Active Problem List   Diagnosis Date Noted  . OP (osteoporosis) 03/20/2015  . Allergic rhinitis 03/20/2015  . Lumbar canal stenosis 03/20/2015  . Vitamin D deficiency 03/20/2015  . Restless legs syndrome 03/20/2015  . Drug-induced gynecomastia 03/20/2015  . Diaphragmatic hernia 03/20/2015  . Acquired cyst of kidney 03/20/2015  . Left ventricular hypertrophy by electrocardiogram 03/20/2015  . Leg pain 03/20/2015  . Chronic kidney disease (CKD), stage III (moderate) 03/20/2015  . History of pneumonia 03/20/2015  . Non-rheumatic tricuspid valve insufficiency 03/20/2015  . Dysmetabolic syndrome [id number]  . Pulmonary hypertension 10/31/2014  . Benign essential HTN 07/29/2014  . Dyslipidemia 07/29/2014  . Prostate cancer   . PVD (peripheral vascular disease)   . GERD (gastroesophageal  reflux disease)   . Anemia of chronic disease   . IBS (irritable bowel syndrome)   . Bradycardia   . ED (erectile dysfunction) of organic origin 06/29/2012  . Genuine stress incontinence, male 06/29/2012    Past Surgical History  Procedure Laterality Date  . Prostatectomy      Family History  Problem Relation Age of Onset  . CVA Mother   . Heart attack Father   . Heart disease Father   . Hepatitis C Sister     History   Social History  . Marital Status: Married    Spouse Name: N/A  . Number of Children: N/A  . Years of Education: N/A   Occupational History  . Not on file.   Social History Main Topics  . Smoking status: Former Smoker -- 0.30 packs/day for 20 years    Types: Cigarettes    Quit date: 08/26/1978  . Smokeless tobacco: Never Used  . Alcohol Use: No  . Drug Use: No  . Sexual Activity: Not Currently   Other Topics Concern  . Not on file   Social History Narrative     Current outpatient prescriptions:  .  amLODipine (NORVASC) 10 MG tablet, Take 1 tablet (10 mg total) by mouth daily., Disp: 90 tablet, Rfl: 1 .  aspirin 81 MG tablet, Take 1 tablet by mouth daily., Disp: , Rfl:  .  cetirizine (ZYRTEC) 5 MG tablet, Take 1 tablet by mouth daily., Disp: , Rfl:  .  fluticasone (FLONASE) 50 MCG/ACT nasal spray, Place into the  nose., Disp: , Rfl:  .  hydrALAZINE (APRESOLINE) 25 MG tablet, Take 1.5 tablets (37.5 mg total) by mouth 3 (three) times daily., Disp: 405 tablet, Rfl: 2 .  losartan (COZAAR) 100 MG tablet, Take 1 tablet (100 mg total) by mouth daily., Disp: 90 tablet, Rfl: 1 .  pravastatin (PRAVACHOL) 40 MG tablet, Take 1 tablet (40 mg total) by mouth daily., Disp: 90 tablet, Rfl: 1 .  vitamin C (ASCORBIC ACID) 500 MG tablet, Take 500 mg by mouth daily., Disp: , Rfl:  .  Vitamin E 400 UNITS CHEW, Chew 1 tablet by mouth daily. , Disp: , Rfl:   Allergies  Allergen Reactions  . Ace Inhibitors     cough     ROS  Constitutional: Negative for fever  or weight change.  Respiratory: Negative for cough and shortness of breath.   Cardiovascular: Negative for chest pain or palpitations.  Gastrointestinal: Negative for abdominal pain, no bowel changes. Had episode of gastroenteritis one week  ago but is doing well now Musculoskeletal: Negative for gait problem or joint swelling.  Skin: Negative for rash.  Neurological: Negative for dizziness or headache.  No other specific complaints in a complete review of systems (except as listed in HPI above). Objective  Filed Vitals:   03/23/15 0815  BP: 142/72  Pulse: 74  Temp: 98 F (36.7 C)  TempSrc: Oral  Resp: 16  Height: 6\' 1"  (1.854 m)  Weight: 196 lb 6.4 oz (89.086 kg)  SpO2: 96%    Body mass index is 25.92 kg/(m^2).  Physical Exam  Constitutional: Patient appears well-developed and well-nourished. No distress.  Eyes:  No scleral icterus. PERL Neck: Normal range of motion. Neck supple. No thyromegaly  Cardiovascular: Normal rate, regular rhythm and normal heart sounds.  No murmur heard. Trace pre tibial  edema. Pulmonary/Chest: Effort normal and breath sounds normal. No respiratory distress. Abdominal: Soft.  There is no tenderness. Psychiatric: Patient has a normal mood and affect. behavior is normal. Judgment and thought content normal.  PHQ2/9: Depression screen PHQ 2/9 03/23/2015  Decreased Interest 0  Down, Depressed, Hopeless 0  PHQ - 2 Score 0    Fall Risk: Fall Risk  03/23/2015  Falls in the past year? No    Assessment & Plan  1. Benign essential HTN  bp is at goal, continue Hydralazine given by cardiologist - losartan (COZAAR) 100 MG tablet; Take 1 tablet (100 mg total) by mouth daily.  Dispense: 90 tablet; Refill: 1 - amLODipine (NORVASC) 10 MG tablet; Take 1 tablet (10 mg total) by mouth daily.  Dispense: 90 tablet; Refill: 1  2. Chronic kidney disease (CKD), stage III (moderate)  Seeing nephrologist every 6 months now, last labs done March 2016  3.  Anemia of chronic disease  stable  4. Pulmonary hypertension  Discussed importance of sleep study, but patient and wife refused at this time  5. Dyslipidemia  - pravastatin (PRAVACHOL) 40 MG tablet; Take 1 tablet (40 mg total) by mouth daily.  Dispense: 90 tablet; Refill: 1  6. Prostate cancer  Seeing Dr. Jacqlyn Larsen, not on medication    7. Need for pneumococcal vaccination  - Pneumococcal conjugate vaccine 13-valent

## 2015-04-24 ENCOUNTER — Ambulatory Visit
Admission: RE | Admit: 2015-04-24 | Discharge: 2015-04-24 | Disposition: A | Discharge status: Home / Self Care | Payer: Commercial Managed Care - HMO | Source: Ambulatory Visit | Attending: Nephrology | Admitting: Nephrology

## 2015-04-24 DIAGNOSIS — N183 Chronic kidney disease, stage 3 unspecified: Secondary | ICD-10-CM

## 2015-04-24 DIAGNOSIS — N281 Cyst of kidney, acquired: Secondary | ICD-10-CM | POA: Diagnosis not present

## 2015-05-30 DIAGNOSIS — N183 Chronic kidney disease, stage 3 (moderate): Secondary | ICD-10-CM | POA: Diagnosis not present

## 2015-05-30 DIAGNOSIS — N2581 Secondary hyperparathyroidism of renal origin: Secondary | ICD-10-CM | POA: Diagnosis not present

## 2015-05-30 DIAGNOSIS — N189 Chronic kidney disease, unspecified: Secondary | ICD-10-CM | POA: Diagnosis not present

## 2015-05-30 DIAGNOSIS — D631 Anemia in chronic kidney disease: Secondary | ICD-10-CM | POA: Diagnosis not present

## 2015-06-01 ENCOUNTER — Other Ambulatory Visit: Payer: Self-pay | Admitting: *Deleted

## 2015-06-01 DIAGNOSIS — Z48812 Encounter for surgical aftercare following surgery on the circulatory system: Secondary | ICD-10-CM

## 2015-06-01 DIAGNOSIS — I739 Peripheral vascular disease, unspecified: Secondary | ICD-10-CM

## 2015-06-02 ENCOUNTER — Ambulatory Visit (HOSPITAL_COMMUNITY)
Admission: RE | Admit: 2015-06-02 | Discharge: 2015-06-02 | Disposition: A | Discharge status: Home / Self Care | Payer: Commercial Managed Care - HMO | Source: Ambulatory Visit | Attending: Surgery | Admitting: Surgery

## 2015-06-02 DIAGNOSIS — I739 Peripheral vascular disease, unspecified: Secondary | ICD-10-CM | POA: Diagnosis not present

## 2015-06-02 DIAGNOSIS — Z48812 Encounter for surgical aftercare following surgery on the circulatory system: Secondary | ICD-10-CM

## 2015-06-06 DIAGNOSIS — N184 Chronic kidney disease, stage 4 (severe): Secondary | ICD-10-CM | POA: Diagnosis not present

## 2015-06-06 DIAGNOSIS — C61 Malignant neoplasm of prostate: Secondary | ICD-10-CM | POA: Diagnosis not present

## 2015-06-06 DIAGNOSIS — N393 Stress incontinence (female) (male): Secondary | ICD-10-CM | POA: Diagnosis not present

## 2015-06-06 DIAGNOSIS — I739 Peripheral vascular disease, unspecified: Secondary | ICD-10-CM | POA: Diagnosis not present

## 2015-06-07 DIAGNOSIS — I739 Peripheral vascular disease, unspecified: Secondary | ICD-10-CM | POA: Insufficient documentation

## 2015-06-08 ENCOUNTER — Encounter: Payer: Self-pay | Admitting: Surgery

## 2015-06-12 ENCOUNTER — Encounter: Payer: Self-pay | Admitting: Surgery

## 2015-06-12 ENCOUNTER — Ambulatory Visit (INDEPENDENT_AMBULATORY_CARE_PROVIDER_SITE_OTHER): Payer: Commercial Managed Care - HMO | Admitting: Surgery

## 2015-06-12 VITALS — BP 153/85 | HR 76 | Ht 73.0 in | Wt 198.0 lb

## 2015-06-12 DIAGNOSIS — M79604 Pain in right leg: Secondary | ICD-10-CM

## 2015-06-12 NOTE — Progress Notes (Signed)
Patient name: Patrick [name]. MRN: [id number] DOB: 1932/04/11 Sex: male   Referred by: Dr. Justin Mend  Reason for referral:  Chief Complaint  Patient presents with  . New Evaluation    eval Rt LE     HISTORY OF PRESENT ILLNESS: This is a 81 year old gentleman who is referred for evaluation of severe right leg pain.  The patient has a history of stenting in his left lower extremity many years ago.  This was done in [place].  His pain is in his right leg and is barely tolerable.  He describes pain around his knee area extending up the outside of his right thigh.  This wakes him from sleep at night.  He has fallen or nearly fallen on several occasions secondary to leg giving out from pain.  The patient has a history of chronic renal insufficiency.  He is not yet on dialysis.  He also suffers from prostate cancer with a recent increase in his PSA.  His hypercholesterolemia is managed with a statin.  He is on multiple medications for hypertension.  He is a former smoker  Past Medical History  Diagnosis Date  . Hypertension   . High cholesterol   . Prostate cancer ([place])   . PVD (peripheral vascular disease) ([place])   . Chronic kidney disease   . GERD (gastroesophageal reflux disease)   . Anemia   . IBS (irritable bowel syndrome)   . Bradycardia     Past Surgical History  Procedure Laterality Date  . Prostatectomy    . Prostate surgery      Social History   Social History  . Marital Status: Married    Spouse Name: N/A  . Number of Children: N/A  . Years of Education: N/A   Occupational History  . Not on file.   Social History Main Topics  . Smoking status: Former Smoker -- 0.30 packs/day for 20 years    Types: Cigarettes    Quit date: 08/26/1978  . Smokeless tobacco: Never Used  . Alcohol Use: No  . Drug Use: No  . Sexual Activity: Not Currently   Other Topics Concern  . Not on file   Social History Narrative    Family History  Problem Relation Age of  Onset  . CVA Mother   . Hypertension Mother   . Heart attack Father   . Heart disease Father     before age 51  . Hypertension Father   . Hepatitis C Sister     Allergies as of 06/12/2015 - Review Complete 06/12/2015  Allergen Reaction Noted  . Ace inhibitors  03/20/2015    Current Outpatient Prescriptions on File Prior to Visit  Medication Sig Dispense Refill  . amLODipine (NORVASC) 10 MG tablet Take 1 tablet (10 mg total) by mouth daily. 90 tablet 1  . aspirin 81 MG tablet Take 1 tablet by mouth daily.    . cetirizine (ZYRTEC) 5 MG tablet Take 1 tablet by mouth daily.    . fluticasone (FLONASE) 50 MCG/ACT nasal spray Place into the nose.    . hydrALAZINE (APRESOLINE) 25 MG tablet Take 1.5 tablets (37.5 mg total) by mouth 3 (three) times daily. 405 tablet 2  . losartan (COZAAR) 100 MG tablet Take 1 tablet (100 mg total) by mouth daily. 90 tablet 1  . pravastatin (PRAVACHOL) 40 MG tablet Take 1 tablet (40 mg total) by mouth daily. 90 tablet 1  . vitamin C (ASCORBIC ACID) 500 MG tablet Take 500  mg by mouth daily.    . Vitamin E 400 UNITS CHEW Chew 1 tablet by mouth daily.      No current facility-administered medications on file prior to visit.     REVIEW OF SYSTEMS: Cardiovascular: No chest pain, chest pressure.  Positive for pain in legs with walking and lying flat.  Positive for leg swelling and varicose veins Pulmonary: No productive cough, asthma or wheezing. Neurologic: No weakness, paresthesias, aphasia, or amaurosis. No dizziness. Hematologic: No bleeding problems or clotting disorders. Musculoskeletal: No joint pain or joint swelling. Gastrointestinal: No blood in stool or hematemesis Genitourinary: No dysuria or hematuria. Psychiatric:: No history of major depression. Integumentary: No rashes or ulcers. Constitutional: No fever or chills.  PHYSICAL EXAMINATION:  Filed Vitals:   06/12/15 1358 06/12/15 1402  BP: 157/92 153/85  Pulse: 76   Height: 6\' 1"  (1.854  m)   Weight: 198 lb (89.812 kg)   SpO2: 96%    Body mass index is 26.13 kg/(m^2). General: The patient appears their stated age.   HEENT:  No gross abnormalities Pulmonary: Respirations are non-labored Musculoskeletal: There are no major deformities.  Pain in the right knee area and the IT band on the right Neurologic: No focal weakness or paresthesias are detected, Skin: There are no ulcer or rashes noted. Psychiatric: The patient has normal affect. Cardiovascular: There is a regular rate and rhythm without significant murmur appreciated.  1-2 plus pitting edema at the level of the ankle bilaterally.  Palpable left  dorsalis pedis pulse and right posterior tibial pulse  Diagnostic Studies: ABIs were ordered and reviewed.  1.21 on the right and 1.2 for the left.  Both with triphasic waveforms    Assessment:  Right leg pain Plan: Based on physical exam with palpable pulses, confirmed with normal ABIs in triphasic waveforms, I do not think that the patient's symptoms are arterial in nature.  I suspect he has a orthopedic type of issue.  He likely has right knee problems or possibly musculoskeletal within the right IT band.  I am going to have him see his primary care doctor in the immediate future for further evaluation.  I reassured him that this is not a vascular problem     V. Leia Alf, M.D. [facility] Office: [phone] Pager:  [phone]

## 2015-06-13 ENCOUNTER — Ambulatory Visit (INDEPENDENT_AMBULATORY_CARE_PROVIDER_SITE_OTHER): Payer: Commercial Managed Care - HMO | Admitting: Family Medicine

## 2015-06-13 ENCOUNTER — Encounter: Payer: Self-pay | Admitting: Family Medicine

## 2015-06-13 VITALS — BP 162/84 | HR 84 | Temp 98.4°F | Resp 18 | Ht 74.0 in | Wt 195.7 lb

## 2015-06-13 DIAGNOSIS — Z23 Encounter for immunization: Secondary | ICD-10-CM | POA: Diagnosis not present

## 2015-06-13 DIAGNOSIS — M4806 Spinal stenosis, lumbar region: Secondary | ICD-10-CM

## 2015-06-13 DIAGNOSIS — M48061 Spinal stenosis, lumbar region without neurogenic claudication: Secondary | ICD-10-CM

## 2015-06-13 DIAGNOSIS — I739 Peripheral vascular disease, unspecified: Secondary | ICD-10-CM

## 2015-06-13 DIAGNOSIS — I272 Other secondary pulmonary hypertension: Secondary | ICD-10-CM

## 2015-06-13 DIAGNOSIS — C61 Malignant neoplasm of prostate: Secondary | ICD-10-CM

## 2015-06-13 DIAGNOSIS — N184 Chronic kidney disease, stage 4 (severe): Secondary | ICD-10-CM

## 2015-06-13 DIAGNOSIS — M898X9 Other specified disorders of bone, unspecified site: Secondary | ICD-10-CM | POA: Insufficient documentation

## 2015-06-13 DIAGNOSIS — M899 Disorder of bone, unspecified: Secondary | ICD-10-CM

## 2015-06-13 DIAGNOSIS — I10 Essential (primary) hypertension: Secondary | ICD-10-CM

## 2015-06-13 DIAGNOSIS — M4800 Spinal stenosis, site unspecified: Secondary | ICD-10-CM | POA: Insufficient documentation

## 2015-06-13 MED ORDER — HYDROCODONE-ACETAMINOPHEN 10-325 MG PO TABS: 1.0000 | ORAL_TABLET | Freq: Four times a day (QID) | ORAL | Status: DC | PRN

## 2015-06-13 NOTE — Progress Notes (Signed)
Name: Patrick [name].   MRN: [id number]    DOB: 20-Jun-1932   Date:06/13/2015       Progress Note  Subjective  Chief Complaint  Chief Complaint  Patient presents with  . Leg Pain    right onset 3 weeks describes as severe pain constant aching that starts at knee and radiates up to hip.  He has went to vascular normal doppler and circulation.  Pt states leg is giving out and almost falling.    HPI  Leg pain: he developed acute onset of right thigh pain, described as a aching/sore sensation that goes down to his right ankle and up to hip  . No tingling or numbness. Pain is worse when standing, walking, but also present when resting and during sleep. Pain right now is 10/10. Seen by vascular surgeon and negative for vascular claudications. Seen by Dr. Jacqlyn Larsen and PSA is going up and we needs to look for bone metastasis. He denies back pain or decrease in rom.   CKI got worse - per wife now stage IV - he is not taking nsaid's  HTN: at home bp is between 140-150, when the bp gets lower he gets symptomatic and at one point he had a syncopal episode.   Pulmonary hypertension: sees cardiologist. Taking Hydralazine, discussed sildenafil with patient and his wife, they will discuss with cardiologist. No chest pain or palpitation, no SOB  Patient Active Problem List   Diagnosis Date Noted  . Bone pain 06/13/2015  . Spinal stenosis 06/13/2015  . Claudication ([place]) 06/07/2015  . OP (osteoporosis) 03/20/2015  . Allergic rhinitis 03/20/2015  . Lumbar canal stenosis 03/20/2015  . Vitamin D deficiency 03/20/2015  . Restless legs syndrome 03/20/2015  . Drug-induced gynecomastia 03/20/2015  . Diaphragmatic hernia 03/20/2015  . Acquired cyst of kidney 03/20/2015  . Left ventricular hypertrophy by electrocardiogram 03/20/2015  . Leg pain 03/20/2015  . Chronic kidney disease, stage IV (severe) ([place]) 03/20/2015  . History of pneumonia 03/20/2015  . Non-rheumatic tricuspid valve insufficiency  03/20/2015  . Dysmetabolic syndrome [id number]  . Pulmonary hypertension ([place]) 10/31/2014  . Benign essential HTN 07/29/2014  . Dyslipidemia 07/29/2014  . PVD (peripheral vascular disease) ([place])   . Anemia of chronic disease   . IBS (irritable bowel syndrome)   . Bradycardia   . ED (erectile dysfunction) of organic origin 06/29/2012  . Genuine stress incontinence, male 06/29/2012  . Malignant neoplasm of prostate ([place]) 06/29/2012    Past Surgical History  Procedure Laterality Date  . Prostatectomy    . Prostate surgery      Family History  Problem Relation Age of Onset  . CVA Mother   . Hypertension Mother   . Heart attack Father   . Heart disease Father     before age 59  . Hypertension Father   . Hepatitis C Sister     Social History   Social History  . Marital Status: Married    Spouse Name: N/A  . Number of Children: N/A  . Years of Education: N/A   Occupational History  . Not on file.   Social History Main Topics  . Smoking status: Former Smoker -- 0.30 packs/day for 20 years    Types: Cigarettes    Quit date: 08/26/1978  . Smokeless tobacco: Never Used  . Alcohol Use: No  . Drug Use: No  . Sexual Activity: Not Currently   Other Topics Concern  . Not on file   Social History Narrative  Current outpatient prescriptions:  .  amLODipine (NORVASC) 10 MG tablet, Take 1 tablet (10 mg total) by mouth daily., Disp: 90 tablet, Rfl: 1 .  aspirin 81 MG tablet, Take 1 tablet by mouth daily., Disp: , Rfl:  .  cetirizine (ZYRTEC) 5 MG tablet, Take 1 tablet by mouth daily., Disp: , Rfl:  .  fluticasone (FLONASE) 50 MCG/ACT nasal spray, Place into the nose., Disp: , Rfl:  .  hydrALAZINE (APRESOLINE) 25 MG tablet, Take 1.5 tablets (37.5 mg total) by mouth 3 (three) times daily., Disp: 405 tablet, Rfl: 2 .  HYDROcodone-acetaminophen (NORCO) 10-325 MG tablet, Take 1 tablet by mouth every 6 (six) hours as needed., Disp: 40 tablet, Rfl: 0 .  losartan (COZAAR) 100  MG tablet, Take 1 tablet (100 mg total) by mouth daily., Disp: 90 tablet, Rfl: 1 .  pravastatin (PRAVACHOL) 40 MG tablet, Take 1 tablet (40 mg total) by mouth daily., Disp: 90 tablet, Rfl: 1 .  vitamin C (ASCORBIC ACID) 500 MG tablet, Take 500 mg by mouth daily., Disp: , Rfl:  .  Vitamin E 400 UNITS CHEW, Chew 1 tablet by mouth daily. , Disp: , Rfl:   Allergies  Allergen Reactions  . Ace Inhibitors     cough     ROS  Constitutional: Negative for fever or weight change.  Respiratory: Negative for cough and shortness of breath.   Cardiovascular: Negative for chest pain or palpitations.  Gastrointestinal: Negative for abdominal pain, no bowel changes.  Musculoskeletal: Positive for gait problem - limping a little, no joint swelling.  Skin: Negative for rash.  Neurological: Negative for dizziness or headache.  No other specific complaints in a complete review of systems (except as listed in HPI above).  Objective  Filed Vitals:   06/13/15 1116  BP: 162/84  Pulse: 84  Temp: 98.4 F (36.9 C)  TempSrc: Oral  Resp: 18  Height: 6\' 2"  (1.88 m)  Weight: 195 lb 11.2 oz (88.769 kg)  SpO2: 94%    Body mass index is 25.12 kg/(m^2).  Physical Exam  Constitutional: Patient appears well-developed .ObeseNo distress.  HEENT: head atraumatic, normocephalic, pupils equal and reactive to light,  neck supple, throat within normal limits Cardiovascular: Normal rate, regular rhythm and normal heart sounds.  No murmur heard. No BLE edema. Pulmonary/Chest: Effort normal and breath sounds normal. No respiratory distress. Abdominal: Soft.  There is no tenderness. Psychiatric: Patient has a normal mood and affect. behavior is normal. Judgment and thought content normal. Muscular Skeletal: no pain during palpation, mild antalgic gait, normal ROM of back, negative straight leg raise, normal rom of knee and hip  PHQ2/9: Depression screen [facility] 2/9 06/13/2015 03/23/2015  Decreased Interest 0 0  Down,  Depressed, Hopeless 0 0  PHQ - 2 Score 0 0     Fall Risk: Fall Risk  06/13/2015 03/23/2015  Falls in the past year? No No     Functional Status Survey: Is the patient deaf or have difficulty hearing?: No Does the patient have difficulty seeing, even when wearing glasses/contacts?: No Does the patient have difficulty concentrating, remembering, or making decisions?: No Does the patient have difficulty walking or climbing stairs?: No Does the patient have difficulty dressing or bathing?: No Does the patient have difficulty doing errands alone such as visiting a doctor's office or shopping?: No    Assessment & Plan  1. Bone pain  - NM Bone Scan Whole Body; Future We will rule out metastasis, but may need repeat MRI because of  history of spinal stenosis  2. Needs flu shot  - Flu vaccine HIGH DOSE PF (Fluzone High dose)  3. Prostate cancer ([place])  - NM Bone Scan Whole Body; Future  4. Chronic kidney disease, stage IV (severe) ([place])  Continue follow up with [facility] in [place]  5. Claudication [facility])   Previous history of stent placement on left leg, recent studies negative for vascular cause of claudication  6. Benign essential HTN  bp is up, but he is in pain and also worried about PSA going up  7. Pulmonary hypertension [facility])  Seeing Cardiologist - Dr. Radford Pax in [place]   8. Spinal stenosis of lumbar region   seen by Dr. Matilde Haymaker - Ortho, no back pain at this time We will hold off on Prednisone at this time, but if bone scan is negative we will try it

## 2015-06-21 ENCOUNTER — Encounter (HOSPITAL_COMMUNITY)
Admission: RE | Admit: 2015-06-21 | Discharge: 2015-06-21 | Disposition: A | Discharge status: Home / Self Care | Payer: Commercial Managed Care - HMO | Source: Ambulatory Visit | Attending: Family Medicine | Admitting: Family Medicine

## 2015-06-21 DIAGNOSIS — C61 Malignant neoplasm of prostate: Secondary | ICD-10-CM | POA: Insufficient documentation

## 2015-06-21 DIAGNOSIS — M899 Disorder of bone, unspecified: Secondary | ICD-10-CM | POA: Insufficient documentation

## 2015-06-21 DIAGNOSIS — M898X9 Other specified disorders of bone, unspecified site: Secondary | ICD-10-CM

## 2015-06-21 MED ORDER — TECHNETIUM TC 99M MEDRONATE IV KIT
25.0000 | PACK | Freq: Once | INTRAVENOUS | Status: AC | PRN
  Administered 2015-06-21: 25 via INTRAVENOUS

## 2015-06-22 ENCOUNTER — Ambulatory Visit: Payer: Commercial Managed Care - HMO | Admitting: Cardiology

## 2015-07-25 ENCOUNTER — Encounter: Payer: Self-pay | Admitting: Family Medicine

## 2015-07-25 ENCOUNTER — Ambulatory Visit (INDEPENDENT_AMBULATORY_CARE_PROVIDER_SITE_OTHER): Payer: Commercial Managed Care - HMO | Admitting: Family Medicine

## 2015-07-25 ENCOUNTER — Ambulatory Visit
Admission: RE | Admit: 2015-07-25 | Discharge: 2015-07-25 | Disposition: A | Discharge status: Home / Self Care | Payer: Commercial Managed Care - HMO | Source: Ambulatory Visit | Attending: Family Medicine | Admitting: Family Medicine

## 2015-07-25 VITALS — BP 148/86 | HR 72 | Temp 98.1°F | Resp 16 | Ht 74.0 in | Wt 195.8 lb

## 2015-07-25 DIAGNOSIS — R938 Abnormal findings on diagnostic imaging of other specified body structures: Secondary | ICD-10-CM | POA: Diagnosis not present

## 2015-07-25 DIAGNOSIS — R9389 Abnormal findings on diagnostic imaging of other specified body structures: Secondary | ICD-10-CM

## 2015-07-25 DIAGNOSIS — M899 Disorder of bone, unspecified: Secondary | ICD-10-CM | POA: Diagnosis not present

## 2015-07-25 DIAGNOSIS — M5136 Other intervertebral disc degeneration, lumbar region: Secondary | ICD-10-CM | POA: Diagnosis not present

## 2015-07-25 DIAGNOSIS — Z8546 Personal history of malignant neoplasm of prostate: Secondary | ICD-10-CM | POA: Insufficient documentation

## 2015-07-25 DIAGNOSIS — M898X9 Other specified disorders of bone, unspecified site: Secondary | ICD-10-CM

## 2015-07-25 DIAGNOSIS — C61 Malignant neoplasm of prostate: Secondary | ICD-10-CM | POA: Diagnosis not present

## 2015-07-25 MED ORDER — HYDROCODONE-ACETAMINOPHEN 10-325 MG PO TABS: 1.0000 | ORAL_TABLET | Freq: Four times a day (QID) | ORAL | Status: DC | PRN

## 2015-07-25 NOTE — Progress Notes (Signed)
Name: Patrick [name].   MRN: [id number]    DOB: 08-Sep-1932   Date:07/25/2015       Progress Note  Subjective  Chief Complaint  Chief Complaint  Patient presents with  . Medication Refill    follow-up  . Hypertension  . Hyperlipidemia  . Leg Pain    improving with pain med    HPI   Leg pain: he developed acute onset of right thigh pain, described as a aching/sore sensation that goes down to his right ankle and up to hip . No tingling or numbness. Pain is worse when standing, walking, but also present when resting and during sleep.Seen by vascular surgeon and negative for vascular claudications. Seen by Dr. Jacqlyn Larsen and PSA is going up and we needs to look for bone metastasis. He had a bone scan last month that showed some increase in activity and advised to have X-rays to see if it is from metastatic disease ( Prostate Cancer ). Taking Hydrocodone three times daily, however last dose was last night and pain right now is a zero. Advised to take it prn, and try Tylenol if no pain when he gets up. He has noticed constipation with Hydrocodone, but no drowsiness. Both legs still ache but not as severe. Doing well now  HTN: bp machine at home broke, today bp is better, not in pain right now. No chest pain or palpation  Prostate Cancer: sees by Dr. Jacqlyn Larsen, had prostatectomy in 1998, followed by Lupron therapy but it caused a lot of side effects and stopped therapy about 4 years ago.    Patient Active Problem List   Diagnosis Date Noted  . Bone pain 06/13/2015  . Spinal stenosis 06/13/2015  . Claudication ([place]) 06/07/2015  . OP (osteoporosis) 03/20/2015  . Allergic rhinitis 03/20/2015  . Lumbar canal stenosis 03/20/2015  . Vitamin D deficiency 03/20/2015  . Restless legs syndrome 03/20/2015  . Drug-induced gynecomastia 03/20/2015  . Diaphragmatic hernia 03/20/2015  . Acquired cyst of kidney 03/20/2015  . Left ventricular hypertrophy by electrocardiogram 03/20/2015  . Leg pain 03/20/2015   . Chronic kidney disease, stage IV (severe) ([place]) 03/20/2015  . History of pneumonia 03/20/2015  . Non-rheumatic tricuspid valve insufficiency 03/20/2015  . Dysmetabolic syndrome Q000111Q  . Pulmonary hypertension ([place]) 10/31/2014  . Benign essential HTN 07/29/2014  . Dyslipidemia 07/29/2014  . PVD (peripheral vascular disease) ([place])   . Anemia of chronic disease   . IBS (irritable bowel syndrome)   . Bradycardia   . ED (erectile dysfunction) of organic origin 06/29/2012  . Genuine stress incontinence, male 06/29/2012  . Malignant neoplasm of prostate ([place]) 06/29/2012    Past Surgical History  Procedure Laterality Date  . Prostatectomy    . Prostate surgery      Family History  Problem Relation Age of Onset  . CVA Mother   . Hypertension Mother   . Heart attack Father   . Heart disease Father     before age 37  . Hypertension Father   . Hepatitis C Sister     Social History   Social History  . Marital Status: Married    Spouse Name: N/A  . Number of Children: N/A  . Years of Education: N/A   Occupational History  . Not on file.   Social History Main Topics  . Smoking status: Former Smoker -- 0.30 packs/day for 20 years    Types: Cigarettes    Quit date: 08/26/1978  . Smokeless tobacco: Never Used  .  Alcohol Use: No  . Drug Use: No  . Sexual Activity: Not Currently   Other Topics Concern  . Not on file   Social History Narrative     Current outpatient prescriptions:  .  amLODipine (NORVASC) 10 MG tablet, Take 1 tablet (10 mg total) by mouth daily., Disp: 90 tablet, Rfl: 1 .  aspirin 81 MG tablet, Take 1 tablet by mouth daily., Disp: , Rfl:  .  cetirizine (ZYRTEC) 5 MG tablet, Take 1 tablet by mouth daily., Disp: , Rfl:  .  fluticasone (FLONASE) 50 MCG/ACT nasal spray, Place into the nose., Disp: , Rfl:  .  hydrALAZINE (APRESOLINE) 25 MG tablet, Take 1.5 tablets (37.5 mg total) by mouth 3 (three) times daily., Disp: 405 tablet, Rfl: 2 .   HYDROcodone-acetaminophen (NORCO) 10-325 MG tablet, Take 1 tablet by mouth every 6 (six) hours as needed., Disp: 60 tablet, Rfl: 0 .  losartan (COZAAR) 100 MG tablet, Take 1 tablet (100 mg total) by mouth daily., Disp: 90 tablet, Rfl: 1 .  pravastatin (PRAVACHOL) 40 MG tablet, Take 1 tablet (40 mg total) by mouth daily., Disp: 90 tablet, Rfl: 1 .  vitamin C (ASCORBIC ACID) 500 MG tablet, Take 500 mg by mouth daily., Disp: , Rfl:  .  Vitamin E 400 UNITS CHEW, Chew 1 tablet by mouth daily. , Disp: , Rfl:   Allergies  Allergen Reactions  . Ace Inhibitors     cough     ROS  Constitutional: Negative for fever or weight change.  Respiratory: Negative for cough and shortness of breath.   Cardiovascular: Negative for chest pain or palpitations.  Gastrointestinal: Negative for abdominal pain, no bowel changes.  Musculoskeletal: Negative for gait problem or joint swelling.  Skin: Negative for rash.  Neurological: Negative for dizziness or headache.  No other specific complaints in a complete review of systems (except as listed in HPI above). Objective  Filed Vitals:   07/25/15 0810  BP: 148/86  Pulse: 72  Temp: 98.1 F (36.7 C)  TempSrc: Oral  Resp: 16  Height: 6\' 2"  (1.88 m)  Weight: 195 lb 12.8 oz (88.814 kg)  SpO2: 98%    Body mass index is 25.13 kg/(m^2).  Physical Exam  Constitutional: Patient appears well-developed and well-nourished.  No distress.  HEENT: head atraumatic, normocephalic, pupils equal and reactive to light,  neck supple, throat within normal limits Cardiovascular: Normal rate, regular rhythm and normal heart sounds.  No murmur heard. No BLE edema. Pulmonary/Chest: Effort normal and breath sounds normal. No respiratory distress. Abdominal: Soft.  There is no tenderness. Psychiatric: Patient has a normal mood and affect. behavior is normal. Judgment and thought content normal. Muscular Skeletal: no pain during palpation of legs, normal rom of both hips, no  pain during palpation of spine.   PHQ2/9: Depression screen [facility] 2/9 06/13/2015 03/23/2015  Decreased Interest 0 0  Down, Depressed, Hopeless 0 0  PHQ - 2 Score 0 0     Fall Risk: Fall Risk  06/13/2015 03/23/2015  Falls in the past year? No No    Assessment & Plan  1. Abnormal finding on imaging  Check x-ray and send copy to Dr. Jacqlyn Larsen - DG Lumbar Spine 2-3 Views; Future - DG Thoracic Spine 2 View; Future - DG CERVICAL SPINE 2 VIEW - DG Ribs Unilateral Right; Future  2. Bone pain  Advised to take medication prn, take Colace when he takes pain medication - HYDROcodone-acetaminophen (NORCO) 10-325 MG tablet; Take 1 tablet by mouth every  6 (six) hours as needed.  Dispense: 60 tablet; Refill: 0  3. Malignant neoplasm of prostate ([place])  Continue follow up with Dr. Jacqlyn Larsen

## 2015-08-08 ENCOUNTER — Telehealth: Payer: Self-pay

## 2015-08-08 NOTE — Telephone Encounter (Signed)
Lattie Haw from [facility] called and asked me to do a [facility] referral for this patient who "self-referred" himself for a second opinion for prostate cancer.  Pt is seeing Dr. Tresa Endo today (08-08-15) NPI: [id number] Status is still pending, Auth # [id number] Start - 08/08/15  End - 02/04/16 ICD-10: Dannielle Huh

## 2015-09-08 DIAGNOSIS — R972 Elevated prostate specific antigen [PSA]: Secondary | ICD-10-CM | POA: Insufficient documentation

## 2015-09-08 DIAGNOSIS — C61 Malignant neoplasm of prostate: Secondary | ICD-10-CM | POA: Diagnosis not present

## 2015-09-08 DIAGNOSIS — R9721 Rising PSA following treatment for malignant neoplasm of prostate: Secondary | ICD-10-CM | POA: Diagnosis not present

## 2015-09-14 ENCOUNTER — Ambulatory Visit: Payer: Commercial Managed Care - HMO | Admitting: Cardiology

## 2015-09-27 ENCOUNTER — Encounter: Payer: Self-pay | Admitting: Family Medicine

## 2015-09-27 ENCOUNTER — Ambulatory Visit (INDEPENDENT_AMBULATORY_CARE_PROVIDER_SITE_OTHER): Payer: Commercial Managed Care - HMO | Admitting: Family Medicine

## 2015-09-27 VITALS — BP 158/80 | HR 73 | Temp 98.8°F | Resp 16 | Wt 198.0 lb

## 2015-09-27 DIAGNOSIS — E785 Hyperlipidemia, unspecified: Secondary | ICD-10-CM | POA: Diagnosis not present

## 2015-09-27 DIAGNOSIS — M899 Disorder of bone, unspecified: Secondary | ICD-10-CM

## 2015-09-27 DIAGNOSIS — N184 Chronic kidney disease, stage 4 (severe): Secondary | ICD-10-CM

## 2015-09-27 DIAGNOSIS — Z79899 Other long term (current) drug therapy: Secondary | ICD-10-CM | POA: Diagnosis not present

## 2015-09-27 DIAGNOSIS — I272 Other secondary pulmonary hypertension: Secondary | ICD-10-CM

## 2015-09-27 DIAGNOSIS — N63 Unspecified lump in breast: Secondary | ICD-10-CM

## 2015-09-27 DIAGNOSIS — N6310 Unspecified lump in the right breast, unspecified quadrant: Secondary | ICD-10-CM

## 2015-09-27 DIAGNOSIS — I10 Essential (primary) hypertension: Secondary | ICD-10-CM

## 2015-09-27 DIAGNOSIS — L729 Follicular cyst of the skin and subcutaneous tissue, unspecified: Secondary | ICD-10-CM

## 2015-09-27 DIAGNOSIS — J40 Bronchitis, not specified as acute or chronic: Secondary | ICD-10-CM

## 2015-09-27 DIAGNOSIS — N62 Hypertrophy of breast: Secondary | ICD-10-CM | POA: Diagnosis not present

## 2015-09-27 DIAGNOSIS — M898X9 Other specified disorders of bone, unspecified site: Secondary | ICD-10-CM

## 2015-09-27 MED ORDER — HYDROCODONE-ACETAMINOPHEN 10-325 MG PO TABS: 1.0000 | ORAL_TABLET | Freq: Two times a day (BID) | ORAL | Status: DC | PRN

## 2015-09-27 MED ORDER — FLUTICASONE FUROATE-VILANTEROL 100-25 MCG/INH IN AEPB: 1.0000 | INHALATION_SPRAY | Freq: Every day | RESPIRATORY_TRACT | Status: DC

## 2015-09-27 MED ORDER — AMLODIPINE BESYLATE 10 MG PO TABS: 10.0000 mg | ORAL_TABLET | Freq: Every day | ORAL | Status: DC

## 2015-09-27 MED ORDER — HYDROCOD POLST-CPM POLST ER 10-8 MG/5ML PO SUER: 5.0000 mL | Freq: Two times a day (BID) | ORAL | Status: DC | PRN

## 2015-09-27 NOTE — Progress Notes (Signed)
Name: Patrick [name].   MRN: [id number]    DOB: 08/18/1932   Date:09/27/2015       Progress Note  Subjective  Chief Complaint  Chief Complaint  Patient presents with  . Follow-up    24-month f/u from 07/24/16 visit; abnormal imaging and malignant neoplasm of prostate  . Cough    patient presents with chest discomfort on right side; patient stated that there is a knot    HPI  HTN: he stopped Taking Losartan because he thought it was causing his leg pain. BP has been elevated, still taking Norvasc and Hydralazine. Advised to resume Losartan and send [place] a message with readings before we adjust other medications. He denies chest pain, no SOB  Breast Mass: he has a mass on right breat, he also states left breast is larger than right - history of medication induced gynecomastia. Mammogram within normal limits, but it was done over one year ago and not seen by surgeon and diagnosed with gynecomastia.  Cyst Skin: he has noticed a couple of cysts under his skin, one right lower chest wall, and the left upper quadrant area, rolls under skin, non tender, present for a few months.   Bronchitis: he states he has been exposed to an URI - a co-worker that has been coughing for the past weeks. He states his symptoms started 4 days ago with a dry cough, no mucus. He has some post-nasal drainage. He denies SOB, fever, orthopnea ( he always sleeps on 2 pillows). He has mild lower extremity edema but resolves when he goes to bed at night.   Bone Pain: he has multiple bone scans and negative for metastasis. He is now seeing Dr. Shon Baton at [facility] as his Urologist. He is taking Hydrocodone prn pain.   Pulmonary hypertension: discussed Revation therapy, but wife and patient are worried about possible side effects  Patient Active Problem List   Diagnosis Date Noted  . Elevated prostate specific antigen (PSA) 09/08/2015  . Bone pain 06/13/2015  . Spinal stenosis 06/13/2015  . Claudication ([place])  06/07/2015  . OP (osteoporosis) 03/20/2015  . Allergic rhinitis 03/20/2015  . Lumbar canal stenosis 03/20/2015  . Vitamin D deficiency 03/20/2015  . Restless legs syndrome 03/20/2015  . Drug-induced gynecomastia 03/20/2015  . Diaphragmatic hernia 03/20/2015  . Acquired cyst of kidney 03/20/2015  . Left ventricular hypertrophy by electrocardiogram 03/20/2015  . Leg pain 03/20/2015  . Chronic kidney disease, stage IV (severe) ([place]) 03/20/2015  . History of pneumonia 03/20/2015  . Non-rheumatic tricuspid valve insufficiency 03/20/2015  . Dysmetabolic syndrome [id number]  . Pulmonary hypertension ([place]) 10/31/2014  . Benign essential HTN 07/29/2014  . Dyslipidemia 07/29/2014  . PVD (peripheral vascular disease) ([place])   . Anemia of chronic disease   . IBS (irritable bowel syndrome)   . Bradycardia   . ED (erectile dysfunction) of organic origin 06/29/2012  . Genuine stress incontinence, male 06/29/2012  . Malignant neoplasm of prostate ([place]) 06/29/2012    Past Surgical History  Procedure Laterality Date  . Prostatectomy    . Prostate surgery      Family History  Problem Relation Age of Onset  . CVA Mother   . Hypertension Mother   . Heart attack Father   . Heart disease Father     before age 66  . Hypertension Father   . Hepatitis C Sister     Social History   Social History  . Marital Status: Married    Spouse Name: N/A  .  Number of Children: N/A  . Years of Education: N/A   Occupational History  . Not on file.   Social History Main Topics  . Smoking status: Former Smoker -- 0.30 packs/day for 20 years    Types: Cigarettes    Quit date: 08/26/1978  . Smokeless tobacco: Never Used  . Alcohol Use: No  . Drug Use: No  . Sexual Activity: Not Currently   Other Topics Concern  . Not on file   Social History Narrative     Current outpatient prescriptions:  .  amLODipine (NORVASC) 10 MG tablet, Take 1 tablet (10 mg total) by mouth daily., Disp: 90 tablet,  Rfl: 1 .  aspirin 81 MG tablet, Take 1 tablet by mouth daily., Disp: , Rfl:  .  fluticasone (FLONASE) 50 MCG/ACT nasal spray, Place into the nose., Disp: , Rfl:  .  hydrALAZINE (APRESOLINE) 25 MG tablet, Take 1.5 tablets (37.5 mg total) by mouth 3 (three) times daily., Disp: 405 tablet, Rfl: 2 .  HYDROcodone-acetaminophen (NORCO) 10-325 MG tablet, Take 1 tablet by mouth 2 (two) times daily as needed., Disp: 60 tablet, Rfl: 0 .  vitamin C (ASCORBIC ACID) 500 MG tablet, Take 500 mg by mouth daily., Disp: , Rfl:  .  Vitamin E 400 UNITS CHEW, Chew 1 tablet by mouth daily. , Disp: , Rfl:  .  cetirizine (ZYRTEC) 5 MG tablet, Take 1 tablet by mouth daily. Reported on 09/27/2015, Disp: , Rfl:  .  chlorpheniramine-HYDROcodone (TUSSIONEX PENNKINETIC ER) 10-8 MG/5ML SUER, Take 5 mLs by mouth every 12 (twelve) hours as needed for cough., Disp: 140 mL, Rfl: 0 .  Fluticasone Furoate-Vilanterol (BREO ELLIPTA) 100-25 MCG/INH AEPB, Inhale 1 puff into the lungs daily., Disp: 60 each, Rfl: 0 .  losartan (COZAAR) 100 MG tablet, Take 1 tablet (100 mg total) by mouth daily. (Patient not taking: Reported on 09/27/2015), Disp: 90 tablet, Rfl: 1 .  pravastatin (PRAVACHOL) 40 MG tablet, Take 1 tablet (40 mg total) by mouth daily. (Patient not taking: Reported on 09/27/2015), Disp: 90 tablet, Rfl: 1  Allergies  Allergen Reactions  . Ace Inhibitors     cough     ROS  Constitutional: Negative for fever or weight change.  Respiratory: Positive  for cough but no shortness of breath.   Cardiovascular: Negative for chest pain or palpitations.  Gastrointestinal: Negative for abdominal pain, no bowel changes.  Musculoskeletal: Negative for gait problem or joint swelling.  Skin: Negative for rash.  Neurological: Negative for dizziness or headache.  No other specific complaints in a complete review of systems (except as listed in HPI above).  Objective  Filed Vitals:   09/27/15 0839  BP: 158/80  Pulse: 73  Temp: 98.8  F (37.1 C)  TempSrc: Oral  Resp: 16  Weight: 198 lb (89.812 kg)  SpO2: 97%    Body mass index is 25.41 kg/(m^2).  Physical Exam  Constitutional: Patient appears well-developed and well-nourished.  No distress.  HEENT: head atraumatic, normocephalic, pupils equal and reactive to light, neck supple, throat within normal limits Cardiovascular: Normal rate, regular rhythm and normal heart sounds.  No murmur heard. Positive for  BLE edema 1 plus. Pulmonary/Chest: Effort normal and breath sounds normal. No respiratory distress. Abdominal: Soft.  There is no tenderness. Psychiatric: Patient has a normal mood and affect. behavior is normal. Judgment and thought content normal. Skin: small cysts under skin on right lower chest wall and left upper quadrant Breast Exam: bilateral gynecomastia - breast lump on right upper  outer quadrant.   PHQ2/9: Depression screen [facility] 2/9 09/27/2015 06/13/2015 03/23/2015  Decreased Interest 0 0 0  Down, Depressed, Hopeless 0 0 0  PHQ - 2 Score 0 0 0    Fall Risk: Fall Risk  09/27/2015 06/13/2015 03/23/2015  Falls in the past year? No No No    Functional Status Survey: Is the patient deaf or have difficulty hearing?: No Does the patient have difficulty seeing, even when wearing glasses/contacts?: No Does the patient have difficulty concentrating, remembering, or making decisions?: No Does the patient have difficulty walking or climbing stairs?: No Does the patient have difficulty dressing or bathing?: No Does the patient have difficulty doing errands alone such as visiting a doctor's office or shopping?: No    Assessment & Plan  1. Benign essential HTN  He needs to resume Losartan - amLODipine (NORVASC) 10 MG tablet; Take 1 tablet (10 mg total) by mouth daily.  Dispense: 90 tablet; Refill: 1  2. Breast mass, right  - Ambulatory referral to General Surgery - MM Digital Screening Unilat R; Future - US BREAST LTD UNI RIGHT INC AXILLA; Future  3.  Dyslipidemia  Resume Pravastatin, recheck labs  4. Pulmonary hypertension (HCC)  Continue bp medication, does not want Revatio at this time  5. Bone pain  - HYDROcodone-acetaminophen (NORCO) 10-325 MG tablet; Take 1 tablet by mouth 2 (two) times daily as needed.  Dispense: 60 tablet; Refill: 0  6. Skin cyst   refer to surgeon  7. Bronchitis  Return if no improvement, seems to be a viral bronchitis - chlorpheniramine-HYDROcodone (TUSSIONEX PENNKINETIC ER) 10-8 MG/5ML SUER; Take 5 mLs by mouth every 12 (twelve) hours as needed for cough.  Dispense: 140 mL; Refill: 0 - Fluticasone Furoate-Vilanterol (BREO ELLIPTA) 100-25 MCG/INH AEPB; Inhale 1 puff into the lungs daily.  Dispense: 60 each; Refill: 0

## 2015-09-28 ENCOUNTER — Telehealth: Payer: Self-pay

## 2015-09-28 ENCOUNTER — Other Ambulatory Visit: Payer: Self-pay | Admitting: Family Medicine

## 2015-09-28 LAB — COMPREHENSIVE METABOLIC PANEL
A/G RATIO: 1.2 (ref 1.1–2.5)
ALK PHOS: 66 IU/L (ref 39–117)
ALT: 26 IU/L (ref 0–44)
AST: 23 IU/L (ref 0–40)
Albumin: 4.2 g/dL (ref 3.5–4.7)
BUN/Creatinine Ratio: 16 (ref 10–22)
BUN: 31 mg/dL — ABNORMAL HIGH (ref 8–27)
Bilirubin Total: 0.5 mg/dL (ref 0.0–1.2)
CALCIUM: 9.9 mg/dL (ref 8.6–10.2)
CHLORIDE: 102 mmol/L (ref 96–106)
CO2: 23 mmol/L (ref 18–29)
Creatinine, Ser: 1.94 mg/dL — ABNORMAL HIGH (ref 0.76–1.27)
GFR calc Af Amer: 36 mL/min/{1.73_m2} — ABNORMAL LOW (ref >59)
GFR, EST NON AFRICAN AMERICAN: 32 mL/min/{1.73_m2} — AB (ref >59)
GLUCOSE: 91 mg/dL (ref 65–99)
Globulin, Total: 3.4 g/dL (ref 1.5–4.5)
POTASSIUM: 4.7 mmol/L (ref 3.5–5.2)
Sodium: 139 mmol/L (ref 134–144)
TOTAL PROTEIN: 7.6 g/dL (ref 6.0–8.5)

## 2015-09-28 LAB — HEMATOCRIT: Hematocrit: 35.8 % — ABNORMAL LOW (ref 37.5–51.0)

## 2015-09-28 LAB — PROTEIN / CREATININE RATIO, URINE
CREATININE, UR: 131.9 mg/dL
PROTEIN UR: 127 mg/dL
PROTEIN/CREAT RATIO: 963 mg/g{creat} — AB (ref 0–200)

## 2015-09-28 LAB — PHOSPHORUS: Phosphorus: 3.2 mg/dL (ref 2.5–4.5)

## 2015-09-28 LAB — LIPID PANEL
CHOLESTEROL TOTAL: 233 mg/dL — AB (ref 100–199)
Chol/HDL Ratio: 3 ratio units (ref 0.0–5.0)
HDL: 78 mg/dL (ref >39)
LDL Calculated: 131 mg/dL — ABNORMAL HIGH (ref 0–99)
TRIGLYCERIDES: 121 mg/dL (ref 0–149)
VLDL Cholesterol Cal: 24 mg/dL (ref 5–40)

## 2015-09-28 MED ORDER — ATORVASTATIN CALCIUM 40 MG PO TABS: 40.0000 mg | ORAL_TABLET | Freq: Every day | ORAL | Status: DC

## 2015-09-28 NOTE — Telephone Encounter (Signed)
-----   Message from Steele Sizer, MD sent at 09/28/2015  8:44 AM EST ----- I would prefer switching him from Pravastatin to Atorvastatin so his LDL can be a little lower.  Ask if he is willing to change it at this time HCT has improved ( anemia is better ) Normal phosphorus Normal glucose and liver function test - GFR is a little better - CKI is stage III now, please send copy to Nephrologist.

## 2015-09-28 NOTE — Telephone Encounter (Signed)
Labs were reviewed and wife stated to send new rx for Atorvastatin to mail order [facility])

## 2015-09-28 NOTE — Telephone Encounter (Signed)
Left message for patient to return my call.

## 2015-09-28 NOTE — Telephone Encounter (Signed)
I changed to Atorvastatin

## 2015-10-05 ENCOUNTER — Other Ambulatory Visit: Payer: Self-pay | Admitting: Cardiology

## 2015-10-25 ENCOUNTER — Ambulatory Visit: Payer: Commercial Managed Care - HMO | Admitting: Cardiology

## 2015-11-05 ENCOUNTER — Encounter: Payer: Self-pay | Admitting: Cardiology

## 2015-11-05 DIAGNOSIS — I272 Pulmonary hypertension, unspecified: Secondary | ICD-10-CM

## 2015-11-05 HISTORY — DX: Pulmonary hypertension, unspecified: I27.20

## 2015-11-05 NOTE — Progress Notes (Signed)
Cardiology Office Note   Date:  11/06/2015   ID:  Patrick [name]., DOB Feb 03, 1932, MRN [id number]  PCP:  Loistine Chance, MD    Chief Complaint  Patient presents with  . Pulmonary HTN  . Hypertension  . Hyperlipidemia      History of Present Illness: Haig Sela. is a 82 y.o. male with a history of HTN, PVD, CKD stage IV, GERD, moderate pulmonary HTN (PASP 84mmHg on echo 2015) and dyslipidemia who presents for followup of HTN. He denies any chest pain, SOB, DOE, LE edema, dizziness, palpitations or syncope.    Past Medical History  Diagnosis Date  . Hypertension   . High cholesterol   . Prostate cancer ([place])   . PVD (peripheral vascular disease) ([place])   . Chronic kidney disease   . GERD (gastroesophageal reflux disease)   . Anemia   . IBS (irritable bowel syndrome)   . Bradycardia   . Pulmonary HTN ([place]) 11/05/2015    Past Surgical History  Procedure Laterality Date  . Prostatectomy    . Prostate surgery       Current Outpatient Prescriptions  Medication Sig Dispense Refill  . amLODipine (NORVASC) 10 MG tablet Take 1 tablet (10 mg total) by mouth daily. 90 tablet 1  . aspirin 81 MG tablet Take 1 tablet by mouth daily.    [name] Kitchen atorvastatin (LIPITOR) 40 MG tablet Take 1 tablet (40 mg total) by mouth daily. 90 tablet 3  . cetirizine (ZYRTEC) 5 MG tablet Take 1 tablet by mouth daily. Reported on 09/27/2015    . chlorpheniramine-HYDROcodone (TUSSIONEX PENNKINETIC ER) 10-8 MG/5ML SUER Take 5 mLs by mouth every 12 (twelve) hours as needed for cough. 140 mL 0  . fluticasone (FLONASE) 50 MCG/ACT nasal spray Place into the nose.    [name] Kitchen Fluticasone Furoate-Vilanterol (BREO ELLIPTA) 100-25 MCG/INH AEPB Inhale 1 puff into the lungs daily. 60 each 0  . hydrALAZINE (APRESOLINE) 25 MG tablet TAKE 1 AND 1/2 TABLETS THREE TIMES DAILY 405 tablet 0  . HYDROcodone-acetaminophen (NORCO) 10-325 MG tablet Take 1 tablet by mouth 2 (two) times daily as needed.  60 tablet 0  . losartan (COZAAR) 100 MG tablet Take 1 tablet (100 mg total) by mouth daily. 90 tablet 1  . vitamin C (ASCORBIC ACID) 500 MG tablet Take 500 mg by mouth daily.    . Vitamin E 400 UNITS CHEW Chew 1 tablet by mouth daily.      No current facility-administered medications for this visit.    Allergies:   Ace inhibitors    Social History:  The patient  reports that he quit smoking about 37 years ago. His smoking use included Cigarettes. He has a 6 pack-year smoking history. He has never used smokeless tobacco. He reports that he does not drink alcohol or use illicit drugs.   Family History:  The patient's family history includes CVA in his mother; Heart attack in his father; Heart disease in his father; Hepatitis C in his sister; Hypertension in his father and mother.    ROS:  Please see the history of present illness.   Otherwise, review of systems are positive for none.   All other systems are reviewed and negative.    PHYSICAL EXAM: VS:  BP 170/92 mmHg  Pulse 77  Ht 6\' 2"  (1.88 m)  Wt 201 lb (91.173 kg)  BMI 25.80 kg/m2  SpO2  97% , BMI Body mass index is 25.8 kg/(m^2). GEN: Well nourished, well developed, in no acute distress HEENT: normal Neck: no JVD, carotid bruits, or masses Cardiac: RRR; no murmurs, rubs, or gallops,no edema  Respiratory:  clear to auscultation bilaterally, normal work of breathing GI: soft, nontender, nondistended, + BS MS: no deformity or atrophy Skin: warm and dry, no rash Neuro:  Strength and sensation are intact Psych: euthymic mood, full affect   EKG:  EKG is ordered today.    Recent Labs: 09/27/2015: ALT 26; BUN 31*; Creatinine, Ser 1.94*; Potassium 4.7; Sodium 139    Lipid Panel    Component Value Date/Time   CHOL 233* 09/27/2015 0958   TRIG 121 09/27/2015 0958   HDL 78 09/27/2015 0958   CHOLHDL 3.0 09/27/2015 0958   LDLCALC 131* 09/27/2015 0958      Wt Readings from Last 3 Encounters:  11/06/15 201 lb (91.173 kg)    09/27/15 198 lb (89.812 kg)  07/25/15 195 lb 12.8 oz (88.814 kg)       ASSESSMENT AND PLAN:  1. Abnormal EKG with T wave changes in anteoseptal leads - no ischemia on nuclear stress test and echo with normal LVF 2. HTN poorly controlled - continue amlodipine/Cozaar/hydralazine - increase hydralazine to 50mg  TID - I have asked him to check his BP daily for a week and call with the results.   3. Dyslipidemia - continue pravastatin - followed by PCP 4. PVD - on statin 5. CKD stage IV- his last creatinine was 1.9    6.   Pulmonary HTN - VQ scan negative for PE.  PFTs were ordered last year but did not appear to be done.  Will repeat 2D echo to assess pulm HTN and if still present will get PFTs and sleep study.    Current medicines are reviewed at length with the patient today.  The patient does not have concerns regarding medicines.  The following changes have been made:  Increase Hydralazine 50mg  TID  Labs/ tests ordered today: See above Assessment and Plan No orders of the defined types were placed in this encounter.     Disposition:   FU with me in 1 year  Signed, Sueanne Margarita, MD  11/06/2015 8:22 AM    [facility] [place], [place], [place]  [address] Phone: [phone]; Fax: [phone]

## 2015-11-06 ENCOUNTER — Encounter: Payer: Self-pay | Admitting: Cardiology

## 2015-11-06 ENCOUNTER — Ambulatory Visit (INDEPENDENT_AMBULATORY_CARE_PROVIDER_SITE_OTHER): Payer: Commercial Managed Care - HMO | Admitting: Cardiology

## 2015-11-06 VITALS — BP 170/92 | HR 77 | Ht 74.0 in | Wt 201.0 lb

## 2015-11-06 DIAGNOSIS — E785 Hyperlipidemia, unspecified: Secondary | ICD-10-CM | POA: Diagnosis not present

## 2015-11-06 DIAGNOSIS — I10 Essential (primary) hypertension: Secondary | ICD-10-CM | POA: Diagnosis not present

## 2015-11-06 DIAGNOSIS — I272 Other secondary pulmonary hypertension: Secondary | ICD-10-CM

## 2015-11-06 MED ORDER — HYDRALAZINE HCL 50 MG PO TABS: 50.0000 mg | ORAL_TABLET | Freq: Three times a day (TID) | ORAL | Status: DC

## 2015-11-06 NOTE — Patient Instructions (Signed)
Medication Instructions:  Your physician has recommended you make the following change in your medication: 1) INCREASE HYDRALAZINE to 50 mg three times daily  Labwork: None  Testing/Procedures: Your physician has requested that you have an echocardiogram. Echocardiography is a painless test that uses sound waves to create images of your heart. It provides your doctor with information about the size and shape of your heart and how well your heart's chambers and valves are working. This procedure takes approximately one hour. There are no restrictions for this procedure.  Follow-Up: Your physician wants you to follow-up in: 1 year with Dr. Radford Pax. You will receive a reminder letter in the mail two months in advance. If you don't receive a letter, please call our office to schedule the follow-up appointment.   Any Other Special Instructions Will Be Listed Below (If Applicable).     If you need a refill on your cardiac medications before your next appointment, please call your pharmacy.

## 2015-11-11 DIAGNOSIS — H401134 Primary open-angle glaucoma, bilateral, indeterminate stage: Secondary | ICD-10-CM | POA: Diagnosis not present

## 2015-11-11 DIAGNOSIS — H2513 Age-related nuclear cataract, bilateral: Secondary | ICD-10-CM | POA: Diagnosis not present

## 2015-11-13 ENCOUNTER — Other Ambulatory Visit: Payer: Self-pay

## 2015-11-13 ENCOUNTER — Ambulatory Visit (HOSPITAL_COMMUNITY): Payer: Commercial Managed Care - HMO | Attending: Cardiovascular Disease

## 2015-11-13 DIAGNOSIS — I272 Other secondary pulmonary hypertension: Secondary | ICD-10-CM | POA: Diagnosis not present

## 2015-11-13 DIAGNOSIS — E785 Hyperlipidemia, unspecified: Secondary | ICD-10-CM | POA: Diagnosis not present

## 2015-11-13 DIAGNOSIS — Z8249 Family history of ischemic heart disease and other diseases of the circulatory system: Secondary | ICD-10-CM | POA: Diagnosis not present

## 2015-11-13 DIAGNOSIS — N189 Chronic kidney disease, unspecified: Secondary | ICD-10-CM | POA: Insufficient documentation

## 2015-11-13 DIAGNOSIS — I313 Pericardial effusion (noninflammatory): Secondary | ICD-10-CM | POA: Diagnosis not present

## 2015-11-13 DIAGNOSIS — I059 Rheumatic mitral valve disease, unspecified: Secondary | ICD-10-CM | POA: Insufficient documentation

## 2015-11-13 DIAGNOSIS — Z87891 Personal history of nicotine dependence: Secondary | ICD-10-CM | POA: Insufficient documentation

## 2015-11-13 DIAGNOSIS — I071 Rheumatic tricuspid insufficiency: Secondary | ICD-10-CM | POA: Insufficient documentation

## 2015-11-13 DIAGNOSIS — I1310 Hypertensive heart and chronic kidney disease without heart failure, with stage 1 through stage 4 chronic kidney disease, or unspecified chronic kidney disease: Secondary | ICD-10-CM | POA: Insufficient documentation

## 2015-12-07 ENCOUNTER — Ambulatory Visit
Admission: RE | Admit: 2015-12-07 | Discharge: 2015-12-07 | Disposition: A | Discharge status: Home / Self Care | Payer: Commercial Managed Care - HMO | Source: Ambulatory Visit | Attending: Family Medicine | Admitting: Family Medicine

## 2015-12-07 DIAGNOSIS — C61 Malignant neoplasm of prostate: Secondary | ICD-10-CM | POA: Diagnosis not present

## 2015-12-07 DIAGNOSIS — N6310 Unspecified lump in the right breast, unspecified quadrant: Secondary | ICD-10-CM

## 2015-12-07 DIAGNOSIS — N62 Hypertrophy of breast: Secondary | ICD-10-CM | POA: Diagnosis not present

## 2015-12-07 DIAGNOSIS — R972 Elevated prostate specific antigen [PSA]: Secondary | ICD-10-CM | POA: Diagnosis not present

## 2015-12-15 DIAGNOSIS — N183 Chronic kidney disease, stage 3 (moderate): Secondary | ICD-10-CM | POA: Diagnosis not present

## 2015-12-15 DIAGNOSIS — N189 Chronic kidney disease, unspecified: Secondary | ICD-10-CM | POA: Diagnosis not present

## 2015-12-15 DIAGNOSIS — N2581 Secondary hyperparathyroidism of renal origin: Secondary | ICD-10-CM | POA: Diagnosis not present

## 2015-12-15 DIAGNOSIS — D631 Anemia in chronic kidney disease: Secondary | ICD-10-CM | POA: Diagnosis not present

## 2015-12-19 ENCOUNTER — Telehealth: Payer: Self-pay | Admitting: Family Medicine

## 2015-12-19 ENCOUNTER — Other Ambulatory Visit: Payer: Self-pay

## 2015-12-19 DIAGNOSIS — N183 Chronic kidney disease, stage 3 unspecified: Secondary | ICD-10-CM

## 2015-12-19 DIAGNOSIS — N184 Chronic kidney disease, stage 4 (severe): Secondary | ICD-10-CM

## 2015-12-19 DIAGNOSIS — H269 Unspecified cataract: Secondary | ICD-10-CM

## 2015-12-19 DIAGNOSIS — Z01810 Encounter for preprocedural cardiovascular examination: Secondary | ICD-10-CM

## 2015-12-19 NOTE — Telephone Encounter (Signed)
Pt needs eye referral to [facility] in [place] with dr Dorann Ou. He has cataract on his eyes. Fax is [phone]. Having this done may 1,17

## 2015-12-20 NOTE — Telephone Encounter (Signed)
I do not see where a referral has been placed for this pt. Please have [facility] a referral for opthalmology for this pt so that I may schedule an appt

## 2016-01-03 ENCOUNTER — Encounter: Payer: Self-pay | Admitting: Family Medicine

## 2016-01-03 ENCOUNTER — Ambulatory Visit (INDEPENDENT_AMBULATORY_CARE_PROVIDER_SITE_OTHER): Payer: Commercial Managed Care - HMO | Admitting: Family Medicine

## 2016-01-03 VITALS — BP 140/72 | HR 73 | Temp 98.1°F | Resp 18 | Ht 74.0 in | Wt 197.5 lb

## 2016-01-03 DIAGNOSIS — R252 Cramp and spasm: Secondary | ICD-10-CM

## 2016-01-03 DIAGNOSIS — N184 Chronic kidney disease, stage 4 (severe): Secondary | ICD-10-CM | POA: Diagnosis not present

## 2016-01-03 DIAGNOSIS — R600 Localized edema: Secondary | ICD-10-CM

## 2016-01-03 DIAGNOSIS — M4806 Spinal stenosis, lumbar region: Secondary | ICD-10-CM | POA: Diagnosis not present

## 2016-01-03 DIAGNOSIS — E785 Hyperlipidemia, unspecified: Secondary | ICD-10-CM

## 2016-01-03 DIAGNOSIS — I272 Other secondary pulmonary hypertension: Secondary | ICD-10-CM

## 2016-01-03 DIAGNOSIS — C61 Malignant neoplasm of prostate: Secondary | ICD-10-CM | POA: Diagnosis not present

## 2016-01-03 DIAGNOSIS — I10 Essential (primary) hypertension: Secondary | ICD-10-CM | POA: Diagnosis not present

## 2016-01-03 DIAGNOSIS — M48061 Spinal stenosis, lumbar region without neurogenic claudication: Secondary | ICD-10-CM

## 2016-01-03 DIAGNOSIS — F0390 Unspecified dementia without behavioral disturbance: Secondary | ICD-10-CM | POA: Diagnosis not present

## 2016-01-03 DIAGNOSIS — G3184 Mild cognitive impairment, so stated: Secondary | ICD-10-CM | POA: Insufficient documentation

## 2016-01-03 MED ORDER — MAGNESIUM OXIDE -MG SUPPLEMENT 200 MG PO TABS: 1.0000 | ORAL_TABLET | Freq: Every day | ORAL | Status: DC

## 2016-01-03 MED ORDER — AMLODIPINE BESYLATE-VALSARTAN 10-320 MG PO TABS: 1.0000 | ORAL_TABLET | Freq: Every day | ORAL | Status: DC

## 2016-01-03 MED ORDER — COQ-10 100 MG PO CPCR: 100.0000 mg | ORAL_CAPSULE | Freq: Every day | ORAL | Status: DC

## 2016-01-03 MED ORDER — MEDICAL COMPRESSION STOCKINGS MISC: 2.0000 | Freq: Every day | Status: DC

## 2016-01-03 MED ORDER — HYDROCODONE-ACETAMINOPHEN 10-325 MG PO TABS: 1.0000 | ORAL_TABLET | Freq: Two times a day (BID) | ORAL | Status: DC | PRN

## 2016-01-03 NOTE — Patient Instructions (Signed)

## 2016-01-03 NOTE — Progress Notes (Signed)
Name: Patrick [name].   MRN: [id number]    DOB: [birth year]   Date:01/03/2016       Progress Note  Subjective  Chief Complaint  Chief Complaint  Patient presents with  . Hypertension    bengin essential & pulmonary. patient is here for his 64-month f/u. no headaches or dizziness.  [name] Kitchen dyslipidemia  . Chronic Kidney Disease    stage IV  . Edema    bilateral swelling in feet.  [name] Kitchen Spasms    bilateral hand cramps  . Other    patient's wife stated that he has been having some issues with memory.    HPI  HTN: He is back on Losartan,  Norvasc and Hydralazine. BP at rest today is at goal, checks at home and usually around 150's but he is usually in a hurry before going to work. No chest pain or palpitation, but he has leg edema, improves in the morning worse at the end of the day.   Breast Mass: he has a mass on right breat, he also states left breast is larger than right - history of medication induced gynecomastia. Mammogram within normal limits, but it was done over one year ago and not seen by surgeon and diagnosed with gynecomastia.  Bone Pain: he has multiple bone scans and negative for metastasis. He is now seeing [facility] Shon [facility]aton at [facility] as his Urologist. He is taking Hydrocodone prn pain. No current pain  Pulmonary hypertension: discussed Revation therapy, but wife and patient are worried about possible side effects   Hand Cramps: seen by Nephrologist and was given magnesium oxide, but he states not improving symptoms, he had labs done by [facility] and we will get records  CKI stage IV: we will get labs for review, continue follow up with nephrologist.  Prostate Cancer: currently seeing Urologist, Dr. Shon Baton  Dyslipidemia: still taking Atorvastatin , discussed adding CoQ 10 supplementation to improve cramps  Mild Cognitive impairment: wife has noticed that he has been slightly forgetful over the past year. He misplaced a few items, also has to be reminded to take  medications. When driving with wife he has missed a couple of turns when driving at night occasionally. At work he is also getting a little forgetful . MMS very low : dementia  Patient Active Problem List   Diagnosis Date Noted  . Mild cognitive impairment 01/03/2016  . Elevated prostate specific antigen (PSA) 09/08/2015  . Bone pain 06/13/2015  . Spinal stenosis 06/13/2015  . Claudication ([place]) 06/07/2015  . OP (osteoporosis) 03/20/2015  . Allergic rhinitis 03/20/2015  . Lumbar canal stenosis 03/20/2015  . Vitamin D deficiency 03/20/2015  . Restless legs syndrome 03/20/2015  . Drug-induced gynecomastia 03/20/2015  . Diaphragmatic hernia 03/20/2015  . Acquired cyst of kidney 03/20/2015  . Left ventricular hypertrophy by electrocardiogram 03/20/2015  . Leg pain 03/20/2015  . Chronic kidney disease, stage IV (severe) ([place]) 03/20/2015  . History of pneumonia 03/20/2015  . Non-rheumatic tricuspid valve insufficiency 03/20/2015  . Dysmetabolic syndrome [id number]  . Pulmonary hypertension ([place]) 10/31/2014  . Benign essential HTN 07/29/2014  . Dyslipidemia 07/29/2014  . PVD (peripheral vascular disease) ([place])   . Anemia of chronic disease   . IBS (irritable bowel syndrome)   . Bradycardia   . ED (erectile dysfunction) of organic origin 06/29/2012  . Genuine stress incontinence, male 06/29/2012  . Malignant neoplasm of prostate ([place]) 06/29/2012    Past Surgical History  Procedure Laterality Date  . Prostatectomy    .  Prostate surgery      Family History  Problem Relation Age of Onset  . CVA Mother   . Hypertension Mother   . Heart attack Father   . Heart disease Father     before age 29  . Hypertension Father   . Hepatitis C Sister     Social History   Social History  . Marital Status: Married    Spouse Name: N/A  . Number of Children: N/A  . Years of Education: N/A   Occupational History  . Not on file.   Social History Main Topics  . Smoking status: Former  Smoker -- 0.30 packs/day for 20 years    Types: Cigarettes    Quit date: 08/26/1978  . Smokeless tobacco: Never Used  . Alcohol Use: No  . Drug Use: No  . Sexual Activity: Not Currently   Other Topics Concern  . Not on file   Social History Narrative     Current outpatient prescriptions:  .  amLODipine-valsartan (EXFORGE) 10-320 MG tablet, Take 1 tablet by mouth daily., Disp: 90 tablet, Rfl: 1 .  aspirin 81 MG tablet, Take 1 tablet by mouth daily., Disp: , Rfl:  .  atorvastatin (LIPITOR) 40 MG tablet, Take 1 tablet (40 mg total) by mouth daily., Disp: 90 tablet, Rfl: 3 .  cetirizine (ZYRTEC) 5 MG tablet, Take 1 tablet by mouth daily. Reported on 09/27/2015, Disp: , Rfl:  .  fluticasone (FLONASE) 50 MCG/ACT nasal spray, Place into the nose., Disp: , Rfl:  .  Fluticasone Furoate-Vilanterol (BREO ELLIPTA) 100-25 MCG/INH AEPB, Inhale 1 puff into the lungs daily., Disp: 60 each, Rfl: 0 .  hydrALAZINE (APRESOLINE) 50 MG tablet, Take 1 tablet (50 mg total) by mouth 3 (three) times daily., Disp: 270 tablet, Rfl: 3 .  HYDROcodone-acetaminophen (NORCO) 10-325 MG tablet, Take 1 tablet by mouth 2 (two) times daily as needed., Disp: 60 tablet, Rfl: 0 .  latanoprost (XALATAN) 0.005 % ophthalmic solution, , Disp: , Rfl:  .  vitamin C (ASCORBIC ACID) 500 MG tablet, Take 500 mg by mouth daily., Disp: , Rfl:  .  Vitamin E 400 UNITS CHEW, Chew 1 tablet by mouth daily. , Disp: , Rfl:   Allergies  Allergen Reactions  . Ace Inhibitors     cough     ROS  Constitutional: Negative for fever , positive for weight change.  Respiratory: Negative for cough and shortness of breath.   Cardiovascular: Negative for chest pain or palpitations.  Gastrointestinal: Negative for abdominal pain, no bowel changes.  Musculoskeletal: Negative for gait problem or joint swelling.  Skin: Negative for rash.  Neurological: Negative for dizziness or headache.  No other specific complaints in a complete review of systems  (except as listed in HPI above).  Objective  Filed Vitals:   01/03/16 0845 01/03/16 0854 01/03/16 0915  BP: 162/78 158/74 140/72  Pulse: 73    Temp: 98.1 F (36.7 C)    TempSrc: Oral    Resp: 18    Height: 6\' 2"  (1.88 m)    Weight: 197 lb 8 oz (89.585 kg)    SpO2: 98%      Body mass index is 25.35 kg/(m^2).  Physical Exam  Constitutional: Patient appears well-developed and well-nourished. Obese  No distress.  HEENT: head atraumatic, normocephalic, pupils equal and reactive to light,  neck supple, throat within normal limits Cardiovascular: Normal rate, regular rhythm and normal heart sounds.  No murmur heard. BLE edema, 1 plus. Pulmonary/Chest: Effort normal and  breath sounds normal. No respiratory distress. Abdominal: Soft.  There is no tenderness. Psychiatric: Patient has a normal mood and affect. behavior is normal. Judgment and thought content normal.  PHQ2/9: Depression screen [facility] 2/9 01/03/2016 09/27/2015 06/13/2015 03/23/2015  Decreased Interest 0 0 0 0  Down, Depressed, Hopeless 0 0 0 0  PHQ - 2 Score 0 0 0 0    Fall Risk: Fall Risk  01/03/2016 09/27/2015 06/13/2015 03/23/2015  Falls in the past year? No No No No    Functional Status Survey: Is the patient deaf or have difficulty hearing?: No Does the patient have difficulty seeing, even when wearing glasses/contacts?: No Does the patient have difficulty concentrating, remembering, or making decisions?: Yes (patient's wife stated that she noticed some issues with memory.) Does the patient have difficulty walking or climbing stairs?: No Does the patient have difficulty dressing or bathing?: No Does the patient have difficulty doing errands alone such as visiting a doctor's office or shopping?: No    Assessment & Plan  1. Benign essential HTN  - amLODipine-valsartan (EXFORGE) 10-320 MG tablet; Take 1 tablet by mouth daily.  Dispense: 90 tablet; Refill: 1  2. Dyslipidemia  Recheck labs yearly   3. Pulmonary  hypertension ([place])  Continue follow up with cardiologist  4. Malignant neoplasm of prostate ([place])  Keep follow up with Dr. Shon Baton  5. Chronic kidney disease (CKD), stage III (moderate)  Continue follow up with nephrologist and current medications  6. Mild cognitive impairment  MMS done today: was 10, patient or wife are not interested in starting medication at this time, discussed labs or referral to neurologist but they would like to hold off. Discussed imaging studies also since he has prostate cancer, but they would like to hold off. Concerned about him driving - wife states he can to to work and come back at this time  7. Bone pain  - HYDROcodone-acetaminophen (NORCO) 10-325 MG tablet; Take 1 tablet by mouth 2 (two) times daily as needed.  Dispense: 60 tablet; Refill: 0

## 2016-01-04 ENCOUNTER — Encounter: Payer: Self-pay | Admitting: Family Medicine

## 2016-01-08 DIAGNOSIS — H401132 Primary open-angle glaucoma, bilateral, moderate stage: Secondary | ICD-10-CM | POA: Diagnosis not present

## 2016-01-08 DIAGNOSIS — H18413 Arcus senilis, bilateral: Secondary | ICD-10-CM | POA: Diagnosis not present

## 2016-01-08 DIAGNOSIS — H2513 Age-related nuclear cataract, bilateral: Secondary | ICD-10-CM | POA: Diagnosis not present

## 2016-01-08 DIAGNOSIS — H25013 Cortical age-related cataract, bilateral: Secondary | ICD-10-CM | POA: Diagnosis not present

## 2016-01-09 ENCOUNTER — Encounter (HOSPITAL_COMMUNITY): Payer: Commercial Managed Care - HMO

## 2016-01-09 ENCOUNTER — Other Ambulatory Visit (HOSPITAL_COMMUNITY): Payer: Commercial Managed Care - HMO

## 2016-01-09 ENCOUNTER — Ambulatory Visit: Payer: Commercial Managed Care - HMO | Admitting: Vascular Surgery

## 2016-03-01 ENCOUNTER — Telehealth: Payer: Self-pay | Admitting: Family Medicine

## 2016-03-01 DIAGNOSIS — I10 Essential (primary) hypertension: Secondary | ICD-10-CM

## 2016-03-01 MED ORDER — AMLODIPINE BESYLATE-VALSARTAN 10-320 MG PO TABS: 1.0000 | ORAL_TABLET | Freq: Every day | ORAL | Status: DC

## 2016-03-01 NOTE — Telephone Encounter (Signed)
done

## 2016-03-01 NOTE — Telephone Encounter (Signed)
Pt needs refill on Amlodipine valsartin to be sent to [facility].

## 2016-04-05 ENCOUNTER — Ambulatory Visit: Payer: Commercial Managed Care - HMO | Admitting: Family Medicine

## 2016-04-15 DIAGNOSIS — R972 Elevated prostate specific antigen [PSA]: Secondary | ICD-10-CM | POA: Diagnosis not present

## 2016-04-15 DIAGNOSIS — R9721 Rising PSA following treatment for malignant neoplasm of prostate: Secondary | ICD-10-CM | POA: Diagnosis not present

## 2016-04-15 DIAGNOSIS — C61 Malignant neoplasm of prostate: Secondary | ICD-10-CM | POA: Diagnosis not present

## 2016-04-22 ENCOUNTER — Ambulatory Visit (INDEPENDENT_AMBULATORY_CARE_PROVIDER_SITE_OTHER): Payer: Commercial Managed Care - HMO | Admitting: Family Medicine

## 2016-04-22 ENCOUNTER — Encounter: Payer: Self-pay | Admitting: Family Medicine

## 2016-04-22 VITALS — BP 136/80 | HR 72 | Temp 98.7°F | Resp 16 | Ht 74.0 in | Wt 192.4 lb

## 2016-04-22 DIAGNOSIS — C61 Malignant neoplasm of prostate: Secondary | ICD-10-CM | POA: Diagnosis not present

## 2016-04-22 DIAGNOSIS — I272 Other secondary pulmonary hypertension: Secondary | ICD-10-CM | POA: Diagnosis not present

## 2016-04-22 DIAGNOSIS — N183 Chronic kidney disease, stage 3 unspecified: Secondary | ICD-10-CM

## 2016-04-22 DIAGNOSIS — F0390 Unspecified dementia without behavioral disturbance: Secondary | ICD-10-CM

## 2016-04-22 DIAGNOSIS — E785 Hyperlipidemia, unspecified: Secondary | ICD-10-CM | POA: Diagnosis not present

## 2016-04-22 DIAGNOSIS — D638 Anemia in other chronic diseases classified elsewhere: Secondary | ICD-10-CM

## 2016-04-22 DIAGNOSIS — I739 Peripheral vascular disease, unspecified: Secondary | ICD-10-CM | POA: Diagnosis not present

## 2016-04-22 DIAGNOSIS — I10 Essential (primary) hypertension: Secondary | ICD-10-CM | POA: Diagnosis not present

## 2016-04-22 NOTE — Progress Notes (Signed)
Name: Patrick [name].   MRN: [id number]    DOB: [birth year]   Date:04/22/2016       Progress Note  Subjective  Chief Complaint  Chief Complaint  Patient presents with  . Follow-up    3 mo    HPI   HTN: He is Valsartan,   Norvasc and Hydralazine. BP at rest today is at goal, not checking bp at home lately. He has some leg edema but doing well.  No chest pain or palpitation.   Breast Mass: he has a mass on right breat, he also states left breast is larger than right - history of medication induced gynecomastia. Mammogram within normal limits, but it was done over one year ago and not seen by surgeon and diagnosed with gynecomastia.   Bone Pain: he has multiple bone scans and negative for metastasis. He is now seeing Dr. Shon Baton at [facility] as his Urologist. He states he is doing much, no longer having a lot of pain. He stopped taking Hydrocodone.  No current pain  Pulmonary hypertension: discussed Revation therapy, but wife and patient are worried about possible side effects   CKI stagecontinue follow up with nephrologist. - [facility]   Prostate Cancer: currently seeing Urologist, Dr. Shon Baton, PSA has gone up to 6 this month, but patient and wife just want to monitor it for now.   Dyslipidemia: still taking Atorvastatin , he states cramps has improved with CoQ10 supplementation   Dementia:  wife states symptoms are stable, slightly forgetful over the past year, gradual process. He misplaced a few items, also has to be reminded to take medications. When driving with wife he has missed a couple of turns when driving at night occasionally. At work he is also getting a little forgetful . MMS very low when checking 12/2015. They refuse referral to Neurologist or medication at this time  Patient Active Problem List   Diagnosis Date Noted  . Mild cognitive impairment 01/03/2016  . Elevated prostate specific antigen (PSA) 09/08/2015  . Bone pain 06/13/2015  . Spinal stenosis  06/13/2015  . OP (osteoporosis) 03/20/2015  . Allergic rhinitis 03/20/2015  . Lumbar canal stenosis 03/20/2015  . Vitamin D deficiency 03/20/2015  . Restless legs syndrome 03/20/2015  . Drug-induced gynecomastia 03/20/2015  . Diaphragmatic hernia 03/20/2015  . Acquired cyst of kidney 03/20/2015  . Left ventricular hypertrophy by electrocardiogram 03/20/2015  . Leg pain 03/20/2015  . Chronic kidney disease, stage IV (severe) ([place]) 03/20/2015  . History of pneumonia 03/20/2015  . Non-rheumatic tricuspid valve insufficiency 03/20/2015  . Dysmetabolic syndrome [id number]  . Pulmonary hypertension ([place]) 10/31/2014  . Benign essential HTN 07/29/2014  . Dyslipidemia 07/29/2014  . PVD (peripheral vascular disease) ([place])   . Anemia of chronic disease   . IBS (irritable bowel syndrome)   . Bradycardia   . ED (erectile dysfunction) of organic origin 06/29/2012  . Genuine stress incontinence, male 06/29/2012  . Malignant neoplasm of prostate ([place]) 06/29/2012    Past Surgical History:  Procedure Laterality Date  . PROSTATE SURGERY    . PROSTATECTOMY      Family History  Problem Relation Age of Onset  . CVA Mother   . Hypertension Mother   . Heart attack Father   . Heart disease Father     before age 56  . Hypertension Father   . Hepatitis C Sister     Social History   Social History  . Marital status: Married    Spouse name: N/A  .  Number of children: N/A  . Years of education: N/A   Occupational History  . Not on file.   Social History Main Topics  . Smoking status: Former Smoker    Packs/day: 0.30    Years: 20.00    Types: Cigarettes    Quit date: 08/26/1978  . Smokeless tobacco: Never Used  . Alcohol use No  . Drug use: No  . Sexual activity: Not Currently   Other Topics Concern  . Not on file   Social History Narrative  . No narrative on file     Current Outpatient Prescriptions:  .  amLODipine-valsartan (EXFORGE) 10-320 MG tablet, Take 1 tablet by  mouth daily., Disp: 90 tablet, Rfl: 1 .  aspirin 81 MG tablet, Take 1 tablet by mouth daily., Disp: , Rfl:  .  atorvastatin (LIPITOR) 40 MG tablet, Take 1 tablet (40 mg total) by mouth daily., Disp: 90 tablet, Rfl: 3 .  cetirizine (ZYRTEC) 5 MG tablet, Take 1 tablet by mouth daily. Reported on 09/27/2015, Disp: , Rfl:  .  Coenzyme Q10 (COQ-10) 100 MG capsule, Take 1 capsule (100 mg total) by mouth daily., Disp: 30 capsule, Rfl: 0 .  Elastic Bandages & Supports (MEDICAL COMPRESSION STOCKINGS) MISC, 2 each by Does not apply route daily., Disp: 2 each, Rfl: 1 .  fluticasone (FLONASE) 50 MCG/ACT nasal spray, Place into the nose., Disp: , Rfl:  .  hydrALAZINE (APRESOLINE) 50 MG tablet, Take 1 tablet (50 mg total) by mouth 3 (three) times daily., Disp: 270 tablet, Rfl: 3 .  latanoprost (XALATAN) 0.005 % ophthalmic solution, , Disp: , Rfl:  .  vitamin C (ASCORBIC ACID) 500 MG tablet, Take 500 mg by mouth daily., Disp: , Rfl:  .  Vitamin E 400 UNITS CHEW, Chew 1 tablet by mouth daily. , Disp: , Rfl:   Allergies  Allergen Reactions  . Ace Inhibitors     cough     ROS  Constitutional: Negative for fever, positive for some weight change.  Respiratory: Negative for cough and shortness of breath.   Cardiovascular: Negative for chest pain or palpitations.  Gastrointestinal: Negative for abdominal pain, no bowel changes.  Musculoskeletal: Negative for gait problem or joint swelling.  Skin: Negative for rash.  Neurological: Negative for dizziness or headache.  No other specific complaints in a complete review of systems (except as listed in HPI above).  Objective  Vitals:   04/22/16 0803  BP: 136/80  Pulse: 72  Resp: 16  Temp: 98.7 F (37.1 C)  TempSrc: Oral  SpO2: 97%  Weight: 192 lb 6.4 oz (87.3 kg)  Height: 6\' 2"  (1.88 m)    Body mass index is 24.7 kg/m.  Physical Exam  Constitutional: Patient appears well-developed and well-nourished. Obese No distress.  HEENT: head atraumatic,  normocephalic, pupils equal and reactive to light, neck supple, throat within normal limits Cardiovascular: Normal rate, regular rhythm and normal heart sounds.  No murmur heard. BLE edema. Pulmonary/Chest: Effort normal and breath sounds normal. No respiratory distress. Abdominal: Soft.  There is no tenderness. Psychiatric: Patient has a normal mood and affect. behavior is normal. Judgment and thought content normal. Normal conversation in the office today. Difficulty recalling name of doctors or time of appointments, wife was in the office with him.   PHQ2/9: Depression screen [facility] 2/9 04/22/2016 01/03/2016 09/27/2015 06/13/2015 03/23/2015  Decreased Interest 0 0 0 0 0  Down, Depressed, Hopeless 0 0 0 0 0  PHQ - 2 Score 0 0 0 0 0  Fall Risk: Fall Risk  04/22/2016 01/03/2016 09/27/2015 06/13/2015 03/23/2015  Falls in the past year? No No No No No     Functional Status Survey: Is the patient deaf or have difficulty hearing?: No Does the patient have difficulty seeing, even when wearing glasses/contacts?: Yes (glasses) Does the patient have difficulty concentrating, remembering, or making decisions?: No Does the patient have difficulty walking or climbing stairs?: No Does the patient have difficulty dressing or bathing?: No Does the patient have difficulty doing errands alone such as visiting a doctor's office or shopping?: No    Assessment & Plan  1. Benign essential HTN  Well controlled with medication   2. Dyslipidemia  Continue mediation   3. Pulmonary hypertension ([place])   He does not want therapy at this time   4. Malignant neoplasm of prostate [facility])  Continue follow up with [facility]  5. Chronic kidney disease, stage III (moderate)  Continue follow up with [facility]  6. Dementia, without behavioral disturbance  stable  7. Anemia of chronic disease  Recheck next visit   8. PVD (peripheral vascular disease) ([place])  He denies claudication at this  time

## 2016-06-18 IMAGING — CR DG CHEST 2V
2 series · 2 of 2 positions shown · non-contrast
Comparison: PA and lateral chest 12/12/2011.

CLINICAL DATA: Pulmonary hypertension.  Patient for V/Q scan.

EXAM:
CHEST  2 VIEW

[chest pa]
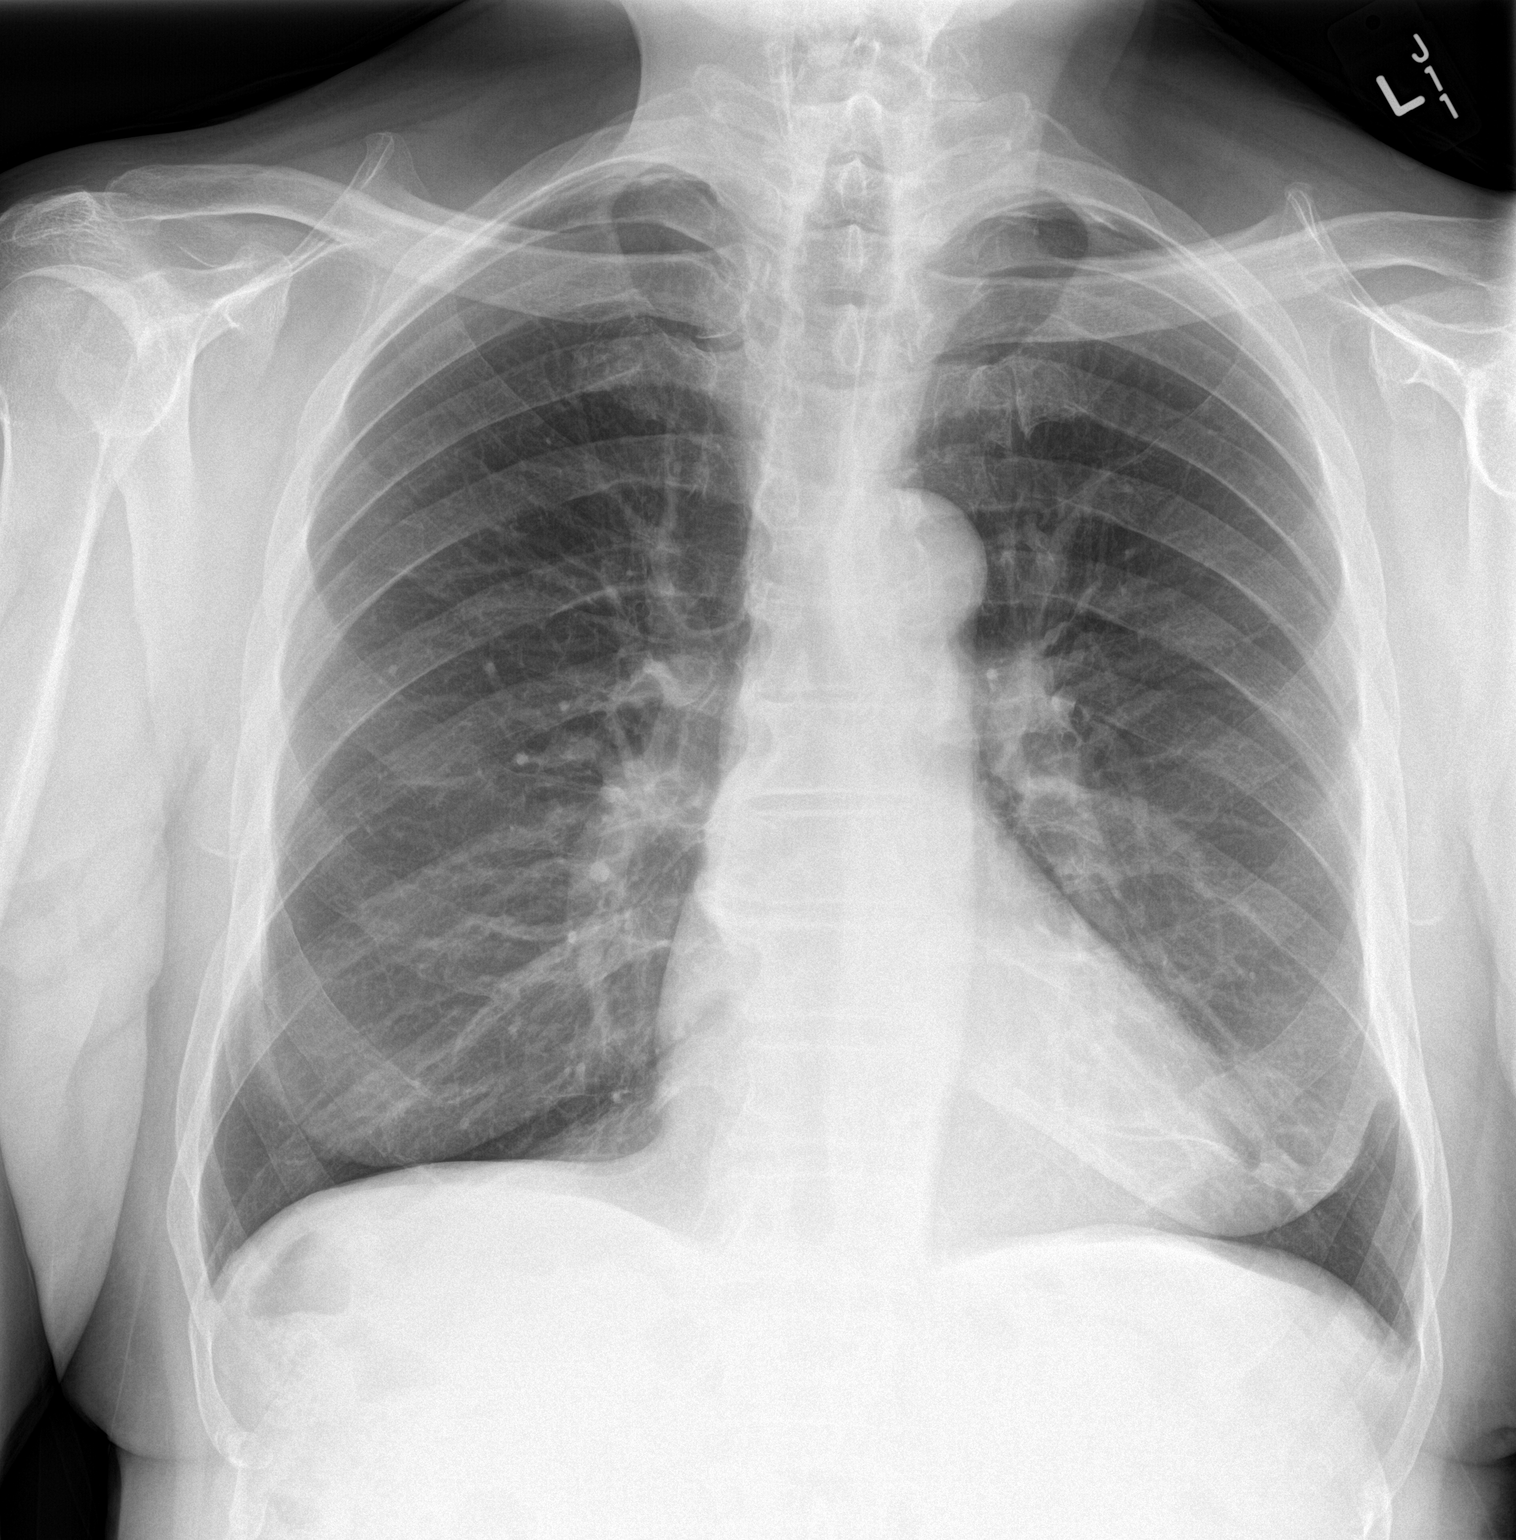

[chest lat]
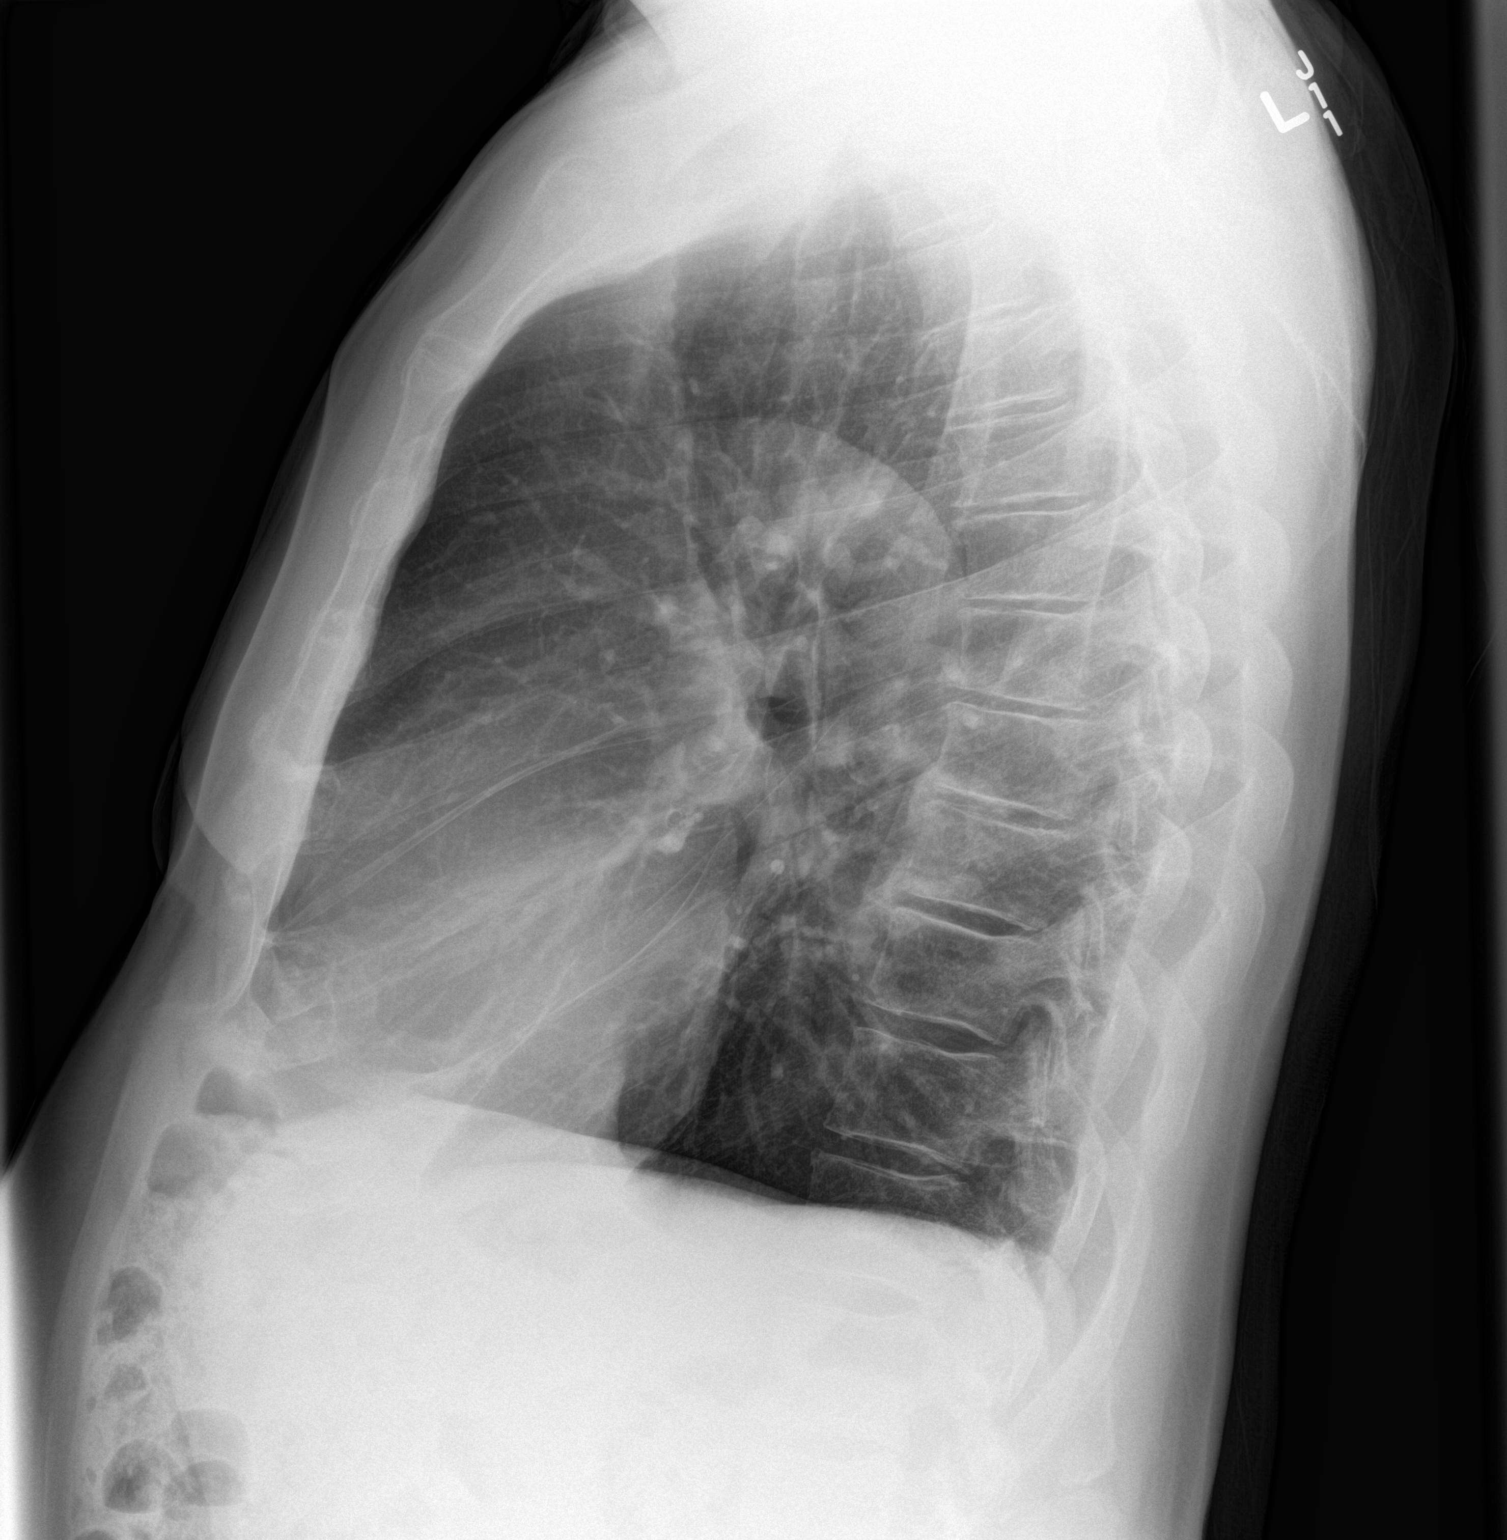

[2 of 2 positions shown; findings below may reference images not displayed]

FINDINGS: The lungs are clear. Heart size is normal. There is no pneumothorax
or pleural effusion. No focal bony abnormality is identified.
IMPRESSION: Negative chest.

## 2016-06-28 ENCOUNTER — Other Ambulatory Visit: Payer: Self-pay | Admitting: Family Medicine

## 2016-06-28 DIAGNOSIS — I10 Essential (primary) hypertension: Secondary | ICD-10-CM

## 2016-07-17 DIAGNOSIS — R972 Elevated prostate specific antigen [PSA]: Secondary | ICD-10-CM | POA: Diagnosis not present

## 2016-07-22 ENCOUNTER — Ambulatory Visit (HOSPITAL_COMMUNITY)
Admission: RE | Admit: 2016-07-22 | Discharge: 2016-07-22 | Disposition: A | Discharge status: Home / Self Care | Payer: Commercial Managed Care - HMO | Source: Ambulatory Visit | Attending: Vascular Surgery | Admitting: Vascular Surgery

## 2016-07-22 ENCOUNTER — Encounter (HOSPITAL_COMMUNITY): Payer: Commercial Managed Care - HMO

## 2016-07-22 ENCOUNTER — Ambulatory Visit: Payer: Commercial Managed Care - HMO | Admitting: Surgery

## 2016-07-22 ENCOUNTER — Other Ambulatory Visit (HOSPITAL_COMMUNITY): Payer: Commercial Managed Care - HMO

## 2016-07-22 ENCOUNTER — Ambulatory Visit (INDEPENDENT_AMBULATORY_CARE_PROVIDER_SITE_OTHER)
Admission: RE | Admit: 2016-07-22 | Discharge: 2016-07-22 | Disposition: A | Discharge status: Home / Self Care | Payer: Commercial Managed Care - HMO | Source: Ambulatory Visit | Attending: Vascular Surgery | Admitting: Vascular Surgery

## 2016-07-22 DIAGNOSIS — Z01810 Encounter for preprocedural cardiovascular examination: Secondary | ICD-10-CM

## 2016-07-22 DIAGNOSIS — N183 Chronic kidney disease, stage 3 unspecified: Secondary | ICD-10-CM

## 2016-07-22 DIAGNOSIS — N184 Chronic kidney disease, stage 4 (severe): Secondary | ICD-10-CM

## 2016-07-24 ENCOUNTER — Ambulatory Visit: Payer: Commercial Managed Care - HMO | Admitting: Family Medicine

## 2016-08-07 ENCOUNTER — Encounter: Payer: Self-pay | Admitting: Surgery

## 2016-08-12 ENCOUNTER — Ambulatory Visit (INDEPENDENT_AMBULATORY_CARE_PROVIDER_SITE_OTHER): Payer: Commercial Managed Care - HMO | Admitting: Surgery

## 2016-08-12 ENCOUNTER — Encounter: Payer: Self-pay | Admitting: Surgery

## 2016-08-12 VITALS — BP 145/77 | HR 60 | Temp 97.4°F | Resp 20 | Ht 74.0 in | Wt 200.0 lb

## 2016-08-12 DIAGNOSIS — H401132 Primary open-angle glaucoma, bilateral, moderate stage: Secondary | ICD-10-CM | POA: Diagnosis not present

## 2016-08-12 DIAGNOSIS — N184 Chronic kidney disease, stage 4 (severe): Secondary | ICD-10-CM | POA: Diagnosis not present

## 2016-08-12 NOTE — Progress Notes (Signed)
Vascular and Vein Specialist of [facility]  Patient name: Patrick [name]. MRN: [id number] DOB: 1932-03-08 Sex: male  REFERRING PHYSICIAN: Dr. Justin Mend  REASON FOR CONSULT: Diaysis  HPI: Patrick [name]. is a 82 y.o. male, who is referred today for evaluation of chronic renal insufficiency.  His renal failure secondary to hypertension.  It is associated with secondary hyperparathyroidism as well as anemia.  His blood pressure is controlled.  He is on a statin for hypercholesterolemia.  He is a former smoker.  He is right-handed.  Past Medical History:  Diagnosis Date  . Anemia   . Bradycardia   . Chronic kidney disease   . GERD (gastroesophageal reflux disease)   . High cholesterol   . Hypertension   . IBS (irritable bowel syndrome)   . Prostate cancer ([place])   . Pulmonary HTN 11/05/2015  . PVD (peripheral vascular disease) (HCC)     Family History  Problem Relation Age of Onset  . CVA Mother   . Hypertension Mother   . Heart attack Father   . Heart disease Father     before age 6  . Hypertension Father   . Hepatitis C Sister     SOCIAL HISTORY: Social History   Social History  . Marital status: Married    Spouse name: N/A  . Number of children: N/A  . Years of education: N/A   Occupational History  . Not on file.   Social History Main Topics  . Smoking status: Former Smoker    Packs/day: 0.30    Years: 20.00    Types: Cigarettes    Quit date: 08/26/1978  . Smokeless tobacco: Never Used  . Alcohol use No  . Drug use: No  . Sexual activity: Not Currently   Other Topics Concern  . Not on file   Social History Narrative  . No narrative on file    Allergies  Allergen Reactions  . Ace Inhibitors     cough    Current Outpatient Prescriptions  Medication Sig Dispense Refill  . amLODipine-valsartan (EXFORGE) 10-320 MG tablet TAKE 1 TABLET EVERY DAY (REPLACES LOSARTAN AND AMLODIPINE) 90 tablet 1  . aspirin 81 MG  tablet Take 1 tablet by mouth daily.    [name] Kitchen atorvastatin (LIPITOR) 40 MG tablet Take 1 tablet (40 mg total) by mouth daily. 90 tablet 3  . cetirizine (ZYRTEC) 5 MG tablet Take 1 tablet by mouth daily. Reported on 09/27/2015    . Coenzyme Q10 (COQ-10) 100 MG capsule Take 1 capsule (100 mg total) by mouth daily. 30 capsule 0  . Elastic Bandages & Supports (MEDICAL COMPRESSION STOCKINGS) MISC 2 each by Does not apply route daily. 2 each 1  . fluticasone (FLONASE) 50 MCG/ACT nasal spray Place into the nose.    . hydrALAZINE (APRESOLINE) 50 MG tablet Take 1 tablet (50 mg total) by mouth 3 (three) times daily. 270 tablet 3  . latanoprost (XALATAN) 0.005 % ophthalmic solution     . vitamin C (ASCORBIC ACID) 500 MG tablet Take 500 mg by mouth daily.    . Vitamin E 400 UNITS CHEW Chew 1 tablet by mouth daily.      No current facility-administered medications for this visit.     REVIEW OF SYSTEMS:  [X]  denotes positive finding, [ ]  denotes negative finding Cardiac  Comments:  Chest pain or chest pressure:    Shortness of breath upon exertion:    Short of breath when lying flat:    Irregular  heart rhythm:        Vascular    Pain in calf, thigh, or hip brought on by ambulation:    Pain in feet at night that wakes you up from your sleep:     Blood clot in your veins:    Leg swelling:  x       Pulmonary    Oxygen at home:    Productive cough:     Wheezing:         Neurologic    Sudden weakness in arms or legs:     Sudden numbness in arms or legs:     Sudden onset of difficulty speaking or slurred speech:    Temporary loss of vision in one eye:     Problems with dizziness:         Gastrointestinal    Blood in stool:     Vomited blood:         Genitourinary    Burning when urinating:     Blood in urine:        Psychiatric    Major depression:         Hematologic    Bleeding problems:    Problems with blood clotting too easily:        Skin    Rashes or ulcers:          Constitutional    Fever or chills:      PHYSICAL EXAM: Vitals:   08/12/16 1227 08/12/16 1228  BP: (!) 154/80 (!) 145/77  Pulse: 60   Resp: 20   Temp: 97.4 F (36.3 C)   TempSrc: Oral   SpO2: 98%   Weight: 200 lb (90.7 kg)   Height: 6\' 2"  (1.88 m)     GENERAL: The patient is a well-nourished male, in no acute distress. The vital signs are documented above. CARDIAC: There is a regular rate and rhythm.  VASCULAR: Palpable left brachial and radial pulse.  Bilateral lower extremity edema PULMONARY: There is good air exchange bilaterally without wheezing or rales.  MUSCULOSKELETAL: There are no major deformities or cyanosis. NEUROLOGIC: No focal weakness or paresthesias are detected. SKIN: There are no ulcers or rashes noted. PSYCHIATRIC: The patient has a normal affect.  DATA:  The patient has a normal arterial Doppler study with brachial artery bifurcation at the antecubital crease. Vein mapping today shows an adequate cephalic vein bilaterally.  He does have a marginal left basilic vein.  ASSESSMENT AND PLAN: Chronic renal insufficiency: I discussed proceeding with a staged left basilic vein procedure.  I think his basilic vein is marginal, however given the fact that he is not yet on dialysis, I feel it is reasonable to attempt a stage basilic vein procedure.  The risks and benefits of the operation were discussed with the patient.  He would like to further discuss this with Dr. Livingston Diones before proceeding.  He will contact me with a time and date for surgery.   [name], MD Vascular and Vein Specialists of [facility] [phone] Pager [phone]

## 2016-08-14 ENCOUNTER — Encounter: Payer: Self-pay | Admitting: Nephrology

## 2016-08-26 ENCOUNTER — Ambulatory Visit (INDEPENDENT_AMBULATORY_CARE_PROVIDER_SITE_OTHER): Payer: Commercial Managed Care - HMO | Admitting: Family Medicine

## 2016-08-26 ENCOUNTER — Encounter: Payer: Self-pay | Admitting: Family Medicine

## 2016-08-26 ENCOUNTER — Telehealth: Payer: Self-pay | Admitting: Family Medicine

## 2016-08-26 VITALS — BP 138/72 | HR 69 | Temp 98.1°F | Resp 16 | Ht 71.75 in | Wt 195.4 lb

## 2016-08-26 DIAGNOSIS — R252 Cramp and spasm: Secondary | ICD-10-CM | POA: Diagnosis not present

## 2016-08-26 DIAGNOSIS — Z23 Encounter for immunization: Secondary | ICD-10-CM | POA: Diagnosis not present

## 2016-08-26 DIAGNOSIS — I272 Pulmonary hypertension, unspecified: Secondary | ICD-10-CM

## 2016-08-26 DIAGNOSIS — N6310 Unspecified lump in the right breast, unspecified quadrant: Secondary | ICD-10-CM

## 2016-08-26 DIAGNOSIS — N184 Chronic kidney disease, stage 4 (severe): Secondary | ICD-10-CM

## 2016-08-26 DIAGNOSIS — I10 Essential (primary) hypertension: Secondary | ICD-10-CM | POA: Diagnosis not present

## 2016-08-26 DIAGNOSIS — I739 Peripheral vascular disease, unspecified: Secondary | ICD-10-CM

## 2016-08-26 DIAGNOSIS — F0390 Unspecified dementia without behavioral disturbance: Secondary | ICD-10-CM

## 2016-08-26 DIAGNOSIS — C61 Malignant neoplasm of prostate: Secondary | ICD-10-CM

## 2016-08-26 DIAGNOSIS — E785 Hyperlipidemia, unspecified: Secondary | ICD-10-CM | POA: Diagnosis not present

## 2016-08-26 DIAGNOSIS — D638 Anemia in other chronic diseases classified elsewhere: Secondary | ICD-10-CM

## 2016-08-26 DIAGNOSIS — I200 Unstable angina: Secondary | ICD-10-CM

## 2016-08-26 LAB — CBC WITH DIFFERENTIAL/PLATELET
BASOS ABS: 29 {cells}/uL (ref 0–200)
BASOS PCT: 1 %
EOS ABS: 174 {cells}/uL (ref 15–500)
EOS PCT: 6 %
HCT: 35 % — ABNORMAL LOW (ref 38.5–50.0)
HEMOGLOBIN: 11.1 g/dL — AB (ref 13.2–17.1)
LYMPHS ABS: 1160 {cells}/uL (ref 850–3900)
Lymphocytes Relative: 40 %
MCH: 30.7 pg (ref 27.0–33.0)
MCHC: 31.7 g/dL — ABNORMAL LOW (ref 32.0–36.0)
MCV: 96.7 fL (ref 80.0–100.0)
MONOS PCT: 8 %
MPV: 10.4 fL (ref 7.5–12.5)
Monocytes Absolute: 232 cells/uL (ref 200–950)
NEUTROS ABS: 1305 {cells}/uL — AB (ref 1500–7800)
Neutrophils Relative %: 45 %
PLATELETS: 172 10*3/uL (ref 140–400)
RBC: 3.62 MIL/uL — ABNORMAL LOW (ref 4.20–5.80)
RDW: 13.1 % (ref 11.0–15.0)
WBC: 2.9 10*3/uL — ABNORMAL LOW (ref 3.8–10.8)

## 2016-08-26 LAB — COMPLETE METABOLIC PANEL WITH GFR
ALBUMIN: 4.1 g/dL (ref 3.6–5.1)
ALK PHOS: 65 U/L (ref 40–115)
ALT: 24 U/L (ref 9–46)
AST: 18 U/L (ref 10–35)
BILIRUBIN TOTAL: 0.5 mg/dL (ref 0.2–1.2)
BUN: 34 mg/dL — AB (ref 7–25)
CO2: 25 mmol/L (ref 20–31)
CREATININE: 2.11 mg/dL — AB (ref 0.70–1.11)
Calcium: 10.3 mg/dL (ref 8.6–10.3)
Chloride: 110 mmol/L (ref 98–110)
GFR, EST NON AFRICAN AMERICAN: 28 mL/min — AB (ref >=60)
GFR, Est African American: 33 mL/min — ABNORMAL LOW (ref >=60)
GLUCOSE: 89 mg/dL (ref 65–99)
Potassium: 5.5 mmol/L — ABNORMAL HIGH (ref 3.5–5.3)
SODIUM: 141 mmol/L (ref 135–146)
TOTAL PROTEIN: 7.7 g/dL (ref 6.1–8.1)

## 2016-08-26 LAB — LIPID PANEL
Cholesterol: 127 mg/dL (ref <200)
HDL: 72 mg/dL (ref >40)
LDL CALC: 45 mg/dL (ref <100)
Total CHOL/HDL Ratio: 1.8 Ratio (ref <5.0)
Triglycerides: 48 mg/dL (ref <150)
VLDL: 10 mg/dL (ref <30)

## 2016-08-26 MED ORDER — ATORVASTATIN CALCIUM 40 MG PO TABS: 40.0000 mg | ORAL_TABLET | Freq: Every day | ORAL | 3 refills | Status: DC

## 2016-08-26 NOTE — Progress Notes (Signed)
Name: Patrick [name].   MRN: [id number]    DOB: 1932/01/28   Date:08/26/2016       Progress Note  Subjective  Chief Complaint  Chief Complaint  Patient presents with  . Hypertension    patient is here for his 3 month f/u  . Chronic Kidney Disease  . Cancer    patient is still under the care of the urologist, Dr. Shon Baton   . Chest Pain    patient's wife stated that he has been having some discomfort on his left side.  . Breast Mass    very tender especially when touched  . Hyperlipidemia    patient stated that he has been having some bilateral leg/ feet swellling    HPI  HTN: He is Valsartan, Norvasc and Hydralazine. BP at rest today is at goal, bp at home 140-150's. He has intermittent lower extremity  edema but doing well at this time. He denies palpitation. He has noticed intermittent chest pain, on the left side described as an aching sensation on left side of chest , sometimes it goes down left arm and associated with SOB. It can happen at rest or during activity. Last episode was 5 days ago. Episodes at most once a week.  He sees Dr. Radford Pax, cardiologist. We will call and schedule follow up with her sooner. He is on aspirin and statin therapy.   Hand cramp: he has [facility]I, going to [facility] in [place]. Last labs done in 80. He states the left hand cramps up about two or three times a week, improves when he shakes his arm. He is getting set up for a fistula and may need to go on HD in the next 9 months.   Breast Mass: he has a mass on right breat, he also states left breast is larger than right - history of medication induced gynecomastia. Mammogram within normal limits, but it was done over one year ago and not seen by surgeon and diagnosed with gynecomastia.   Bone Pain: he has multiple bone scans and negative for metastasis. He is now seeing Dr. Shon Baton at [facility] as his Urologist. He states he is doing much, no longer having pain.   Pulmonary hypertension:  discussed Revation therapy, but wife and patient are worried about possible side effects   CKI stagecontinue follow up with nephrologist. - [facility] , they are planning on having a HD fistula soon  Prostate Cancer: currently seeing Urologist, Dr. Shon Baton, PSA was checked by Urologist recently.  Dyslipidemia: still taking Atorvastatin , he states cramps has improved with CoQ10 supplementation   Dementia:  wife states symptoms are stable, slightly forgetful over the past year, gradual process. He misplaced a few items, also has to be reminded to take medications. When driving with wife he has missed a couple of turns when driving, but not recently. At work he is also getting a little forgetful . MMS very low when checking 80/2017. They refuse referral to Neurologist or medication at this time. He is not willing to recheck it at this time. Discussed importance of not driving, but wife said he is doing well at this time   Patient Active Problem List   Diagnosis Date Noted  . Mild cognitive impairment 01/03/2016  . Elevated prostate specific antigen (PSA) 09/08/2015  . Bone pain 06/13/2015  . Spinal stenosis 06/13/2015  . OP (osteoporosis) 03/20/2015  . Allergic rhinitis 03/20/2015  . Lumbar canal stenosis 03/20/2015  . Vitamin D deficiency 03/20/2015  .  Restless legs syndrome 03/20/2015  . Drug-induced gynecomastia 03/20/2015  . Diaphragmatic hernia 03/20/2015  . Acquired cyst of kidney 03/20/2015  . Left ventricular hypertrophy by electrocardiogram 03/20/2015  . Leg pain 03/20/2015  . Chronic kidney disease, stage IV (severe) ([place]) 03/20/2015  . History of pneumonia 03/20/2015  . Non-rheumatic tricuspid valve insufficiency 03/20/2015  . Dysmetabolic syndrome 47/05/6282  . Pulmonary hypertension 10/31/2014  . Benign essential HTN 07/29/2014  . Dyslipidemia 07/29/2014  . PVD (peripheral vascular disease) ([place])   . Anemia of chronic disease   . IBS (irritable bowel syndrome)    . Bradycardia   . ED (erectile dysfunction) of organic origin 06/29/2012  . Genuine stress incontinence, male 06/29/2012  . Malignant neoplasm of prostate ([place]) 06/29/2012    Past Surgical History:  Procedure Laterality Date  . PROSTATE SURGERY    . PROSTATECTOMY      Family History  Problem Relation Age of Onset  . CVA Mother   . Hypertension Mother   . Heart attack Father   . Heart disease Father     before age 73  . Hypertension Father   . Hepatitis C Sister     Social History   Social History  . Marital status: Married    Spouse name: N/A  . Number of children: N/A  . Years of education: N/A   Occupational History  . Not on file.   Social History Main Topics  . Smoking status: Former Smoker    Packs/day: 0.30    Years: 20.00    Types: Cigarettes    Quit date: 08/26/1978  . Smokeless tobacco: Never Used  . Alcohol use No  . Drug use: No  . Sexual activity: Not Currently   Other Topics Concern  . Not on file   Social History Narrative  . No narrative on file     Current Outpatient Prescriptions:  .  amLODipine-valsartan (EXFORGE) 10-320 MG tablet, TAKE 1 TABLET EVERY DAY (REPLACES LOSARTAN AND AMLODIPINE), Disp: 90 tablet, Rfl: 1 .  aspirin 81 MG tablet, Take 1 tablet by mouth daily., Disp: , Rfl:  .  atorvastatin (LIPITOR) 40 MG tablet, Take 1 tablet (40 mg total) by mouth daily., Disp: 90 tablet, Rfl: 3 .  cetirizine (ZYRTEC) 5 MG tablet, Take 1 tablet by mouth daily. Reported on 09/27/2015, Disp: , Rfl:  .  Coenzyme Q10 (COQ-10) 100 MG capsule, Take 1 capsule (100 mg total) by mouth daily., Disp: 30 capsule, Rfl: 0 .  dorzolamide (TRUSOPT) 2 % ophthalmic solution, Place 1 drop into both eyes 2 (two) times daily., Disp: , Rfl:  .  Elastic Bandages & Supports ([place]) MISC, 2 each by Does not apply route daily., Disp: 2 each, Rfl: 1 .  fluticasone (FLONASE) 50 MCG/ACT nasal spray, Place into the nose., Disp: , Rfl:  .   hydrALAZINE (APRESOLINE) 50 MG tablet, Take 1 tablet (50 mg total) by mouth 3 (three) times daily., Disp: 270 tablet, Rfl: 3 .  latanoprost (XALATAN) 0.005 % ophthalmic solution, , Disp: , Rfl:  .  vitamin C (ASCORBIC ACID) 500 MG tablet, Take 500 mg by mouth daily., Disp: , Rfl:  .  Vitamin E 400 UNITS CHEW, Chew 1 tablet by mouth daily. , Disp: , Rfl:   Allergies  Allergen Reactions  . Ace Inhibitors     cough     ROS  Constitutional: Negative for fever or weight change.  Respiratory: Negative for cough, positive for intermittent  shortness of breath.  Cardiovascular: Positive  for chest pain, no  palpitations.  Gastrointestinal: Negative for abdominal pain, no bowel changes.  Musculoskeletal: Negative for gait problem or joint swelling.  Skin: Negative for rash.  Neurological: Negative for dizziness or headache.  No other specific complaints in a complete review of systems (except as listed in HPI above).  Objective  Vitals:   08/26/16 0754  BP: 138/72  Pulse: 69  Resp: 16  Temp: 98.1 F (36.7 C)  TempSrc: Oral  SpO2: 97%  Weight: 195 lb 6.4 oz (88.6 kg)  Height: 5' 11.75" (1.822 m)    Body mass index is 26.69 kg/m.  Physical Exam  Constitutional: Patient appears well-developed and well-nourished. Obese  No distress.  HEENT: head atraumatic, normocephalic, pupils equal and reactive to light,  neck supple, throat within normal limits Cardiovascular: Normal rate, regular rhythm and normal heart sounds.  No murmur heard. Trace BLE ankle edema. Pulmonary/Chest: Effort normal and breath sounds normal. No respiratory distress. Abdominal: Soft.  There is no tenderness. Psychiatric: Patient has a normal mood and affect. behavior is normal. Judgment and thought content normal.  PHQ2/9: Depression screen [facility] 2/9 04/22/2016 01/03/2016 09/27/2015 06/13/2015 03/23/2015  Decreased Interest 0 0 0 0 0  Down, Depressed, Hopeless 0 0 0 0 0  PHQ - 2 Score 0 0 0 0 0     Fall  Risk: Fall Risk  04/22/2016 01/03/2016 09/27/2015 06/13/2015 03/23/2015  Falls in the past year? No No No No No      Assessment & Plan  1. Benign essential HTN  - CBC with Differential/Platelet - COMPLETE METABOLIC PANEL WITH GFR  2. Dyslipidemia  - atorvastatin (LIPITOR) 40 MG tablet; Take 1 tablet (40 mg total) by mouth daily.  Dispense: 90 tablet; Refill: 3 - Lipid panel  3. Pulmonary hypertension  Refuses medication   4. Malignant neoplasm of prostate ([place])  Continue follow up with Dr. Shon Baton  5. Anemia of chronic disease  -CBC  6. PVD (peripheral vascular disease) ([place])  He is seeing vascular surgeon for fistula   7. Chronic kidney disease, stage IV (severe) (HCC)  - CBC with Differential/Platelet - Parathyroid hormone, intact (no Ca) - VITAMIN D 25 Hydroxy (Vit-D Deficiency, Fractures) - Urine Microalbumin w/creat. ratio  8. Claudication ([place])  stable  9. Hand cramps  It may be carpal tunnel or electrolyte disturbance. He has multiple medical problems and we will hold off on addressing this problem at this time  10. Unstable angina pectoris [facility])  We will contact his cardiologist and get him in soon  49. Needs flu shot  - Flu vaccine HIGH DOSE PF  12. Need for pneumococcal vaccination  - Pneumococcal polysaccharide vaccine 23-valent greater than or equal to 2yo subcutaneous/IM  13. Breast mass, right  Seen by surgeon, gynecomastia  14. Dementia without behavioral disturbance, unspecified dementia type  Explained seeing Neurologist, but wife and patient refused to do so at this time, she states he is doing better

## 2016-08-26 NOTE — Progress Notes (Signed)
CARDIOLOGY OFFICE NOTE  Date:  08/27/2016    Patrick [name]. Date of Birth: 1931/10/14 Medical Record #[id number]  PCP:  Loistine Chance, MD  Cardiologist:  Radford Pax    Chief Complaint  Patient presents with  . Hypertension  . Chest Pain    Work in visit - seen for Dr. Radford Pax    History of Present Illness: Patrick [name]. is a 82 y.o. male who presents today for a work in visit. Seen for Dr. Radford Pax.   He has a history of HTN, PVD, CKD stage IV, GERD, moderate pulmonary HTN (PASP 57mmHg on echo 2015) and dyslipidemia. He has had a chronically abnormal EKG and has had prior normal Myoview in 2015. Last echo from March of 2017 - this showed just very mild pulmonary HTN and was improved from prior study.   He was last seen here in February. Echo updated - noted to be improved.   Seen by PCP yesterday and endorsed intermittent chest pain with radiation down the left arm and shortness of breath - thus added to my schedule for today.    Comes in today. Here with his wife today. I have seen her as well. She answers almost every question for him. He has had chest pain for about a week or so - perhaps a little longer. Seems to be more at rest and not really exertional. Described as a tightness. She feels it will last for about 10 to 15 minutes. He tells me it is more on the right side and worse with rubbing his chest. He has had more swelling. Will be having his fistula placed - proactive for possible dialysis. Says his breathing is ok. Not dizzy. No NTG use. Still working full time in a warehouse. BP looks good.    Past Medical History:  Diagnosis Date  . Anemia   . Bradycardia   . Chronic kidney disease   . GERD (gastroesophageal reflux disease)   . High cholesterol   . Hypertension   . IBS (irritable bowel syndrome)   . Prostate cancer ([place])   . Pulmonary HTN 11/05/2015  . PVD (peripheral vascular disease) ([place])     Past Surgical History:  Procedure Laterality Date   . PROSTATE SURGERY    . PROSTATECTOMY       Medications: Current Outpatient Prescriptions  Medication Sig Dispense Refill  . amLODipine-valsartan (EXFORGE) 10-320 MG tablet TAKE 1 TABLET EVERY DAY (REPLACES LOSARTAN AND AMLODIPINE) 90 tablet 1  . aspirin 81 MG tablet Take 1 tablet by mouth daily.    [name] Kitchen atorvastatin (LIPITOR) 40 MG tablet Take 1 tablet (40 mg total) by mouth daily. 90 tablet 3  . cetirizine (ZYRTEC) 5 MG tablet Take 1 tablet by mouth daily. Reported on 09/27/2015    . Coenzyme Q10 (COQ-10) 100 MG capsule Take 1 capsule (100 mg total) by mouth daily. 30 capsule 0  . dorzolamide (TRUSOPT) 2 % ophthalmic solution Place 1 drop into both eyes 2 (two) times daily.    [name] Bandages & Supports (MEDICAL COMPRESSION STOCKINGS) MISC 2 each by Does not apply route daily. 2 each 1  . fluticasone (FLONASE) 50 MCG/ACT nasal spray Place into the nose.    . hydrALAZINE (APRESOLINE) 50 MG tablet Take 1 tablet (50 mg total) by mouth 3 (three) times daily. 270 tablet 3  . latanoprost (XALATAN) 0.005 % ophthalmic solution     . vitamin C (ASCORBIC ACID) 500 MG tablet Take 500 mg  by mouth daily.    . Vitamin E 400 UNITS CHEW Chew 1 tablet by mouth daily.      No current facility-administered medications for this visit.     Allergies: Allergies  Allergen Reactions  . Ace Inhibitors     cough    Social History: The patient  reports that he quit smoking about 38 years ago. His smoking use included Cigarettes. He has a 6.00 pack-year smoking history. He has never used smokeless tobacco. He reports that he does not drink alcohol or use drugs.   Family History: The patient's family history includes CVA in his mother; Heart attack in his father; Heart disease in his father; Hepatitis C in his sister; Hypertension in his father and mother.   Review of Systems: Please see the history of present illness.   Otherwise, the review of systems is positive for none.   All other systems are  reviewed and negative.   Physical Exam: VS:  BP 136/70   Pulse 62   Ht 5\' 11"  (1.803 m)   Wt 195 lb (88.5 kg)   BMI 27.20 kg/m  .  BMI Body mass index is 27.2 kg/m.  Wt Readings from Last 3 Encounters:  08/27/16 195 lb (88.5 kg)  08/26/16 195 lb 6.4 oz (88.6 kg)  08/12/16 200 lb (90.7 kg)    General: Pleasant. Elderly black male who is alert and in no acute distress.   HEENT: Normal.  Neck: Supple, no JVD, carotid bruits, or masses noted.  Cardiac: Regular rate and rhythm. No murmurs, rubs, or gallops. Trace edema.  Respiratory:  Lungs are clear to auscultation bilaterally with normal work of breathing.  GI: Soft and nontender.  MS: No deformity or atrophy. Gait and ROM intact.  Skin: Warm and dry. Color is normal.  Neuro:  Strength and sensation are intact and no gross focal deficits noted.  Psych: Alert, appropriate and with normal affect.   LABORATORY DATA:  EKG:  EKG is ordered today. This demonstrates NSR today.  Lab Results  Component Value Date   WBC 2.9 (L) 08/26/2016   HGB 11.1 (L) 08/26/2016   HCT 35.0 (L) 08/26/2016   PLT 172 08/26/2016   GLUCOSE 89 08/26/2016   CHOL 127 08/26/2016   TRIG 48 08/26/2016   HDL 72 08/26/2016   LDLCALC 45 08/26/2016   ALT 24 08/26/2016   AST 18 08/26/2016   NA 141 08/26/2016   K 5.5 (H) 08/26/2016   CL 110 08/26/2016   CREATININE 2.11 (H) 08/26/2016   BUN 34 (H) 08/26/2016   CO2 25 08/26/2016   MICROALBUR 33.5 08/26/2016    BNP (last 3 results) No results for input(s): BNP in the last 8760 hours.  ProBNP (last 3 results) No results for input(s): PROBNP in the last 8760 hours.   Other Studies Reviewed Today:  Echo Study Conclusions from 11/2015  - Left ventricle: The cavity size was normal. There was moderate to   severe concentric hypertrophy. Systolic function was normal. The   estimated ejection fraction was in the range of 60% to 65%. Wall   motion was normal; there were no regional wall motion    abnormalities. Doppler parameters are consistent with abnormal   left ventricular relaxation (grade 1 diastolic dysfunction). - Aortic valve: Transvalvular velocity was within the normal range.   There was no stenosis. There was no regurgitation. - Mitral valve: Calcified annulus. Transvalvular velocity was   within the normal range. There was no evidence for stenosis.  There was no regurgitation. - Left atrium: The atrium was moderately dilated. - Right ventricle: The cavity size was normal. Wall thickness was   normal. Systolic function was normal. - Right atrium: The atrium was mildly dilated. - Atrial septum: No defect or patent foramen ovale was identified   by color flow Doppler. - Tricuspid valve: There was mild regurgitation. - Inferior vena cava: The vessel was normal in size. The   respirophasic diameter changes were in the normal range (= 50%),   consistent with normal central venous pressure. - Pericardium, extracardiac: A trivial pericardial effusion was   identified.   Assessment/Plan:  1. Chest pain - multiple CV risk factors. History a little hard to discern given that his wife interjects most answers. Will update his Myoview. Hold on cardiac catheterization for now - especially in light of his progressive CKD.   2. History of prior abnormal EKG - prior negative Myoview from 2015 noted  3. HTN - BP ok today.   4. HLD - labs by PCP - on statin  5. CKD -to have fistula placed for possible dialysis in the future.   6. Mild pulmonary HTN by echo 11/2015 - currently not short of breath.   Current medicines are reviewed with the patient today.  The patient does not have concerns regarding medicines other than what has been noted above.  The following changes have been made:  See above.  Labs/ tests ordered today include:   No orders of the defined types were placed in this encounter.    Disposition:   Further disposition pending.    Patient is agreeable to  this plan and will call if any problems develop in the interim.   Signed: Burtis Junes, RN, ANP-C 08/27/2016 11:50 AM  [facility] [address] [place], [place]  [address] Phone: [phone] Fax: [phone]

## 2016-08-26 NOTE — Telephone Encounter (Signed)
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

## 2016-08-27 ENCOUNTER — Ambulatory Visit (INDEPENDENT_AMBULATORY_CARE_PROVIDER_SITE_OTHER): Payer: Commercial Managed Care - HMO | Admitting: Nurse Practitioner

## 2016-08-27 ENCOUNTER — Encounter: Payer: Self-pay | Admitting: Nurse Practitioner

## 2016-08-27 ENCOUNTER — Other Ambulatory Visit: Payer: Self-pay | Admitting: Family Medicine

## 2016-08-27 VITALS — BP 136/70 | HR 62 | Ht 71.0 in | Wt 195.0 lb

## 2016-08-27 DIAGNOSIS — R0789 Other chest pain: Secondary | ICD-10-CM | POA: Diagnosis not present

## 2016-08-27 DIAGNOSIS — E875 Hyperkalemia: Secondary | ICD-10-CM

## 2016-08-27 LAB — PARATHYROID HORMONE, INTACT (NO CA): PTH: 111 pg/mL — AB (ref 14–64)

## 2016-08-27 LAB — MICROALBUMIN / CREATININE URINE RATIO
CREATININE, URINE: 171 mg/dL (ref 20–370)
MICROALB UR: 33.5 mg/dL
MICROALB/CREAT RATIO: 196 ug/mg{creat} — AB (ref <30)

## 2016-08-27 LAB — MAGNESIUM: MAGNESIUM: 2.3 mg/dL (ref 1.5–2.5)

## 2016-08-27 LAB — VITAMIN D 25 HYDROXY (VIT D DEFICIENCY, FRACTURES): VIT D 25 HYDROXY: 42 ng/mL (ref 30–100)

## 2016-08-27 NOTE — Patient Instructions (Addendum)
We will be checking the following labs today - None  Medication Instructions:    Continue with your current medicines.     Testing/Procedures To Be Arranged:  Lexiscan Myoview  Follow-Up:   Will see how your test turns out.     Other Special Instructions:   N/A    If you need a refill on your cardiac medications before your next appointment, please call your pharmacy.   Call the [place] office at [phone] if you have any questions, problems or concerns.

## 2016-08-28 ENCOUNTER — Other Ambulatory Visit: Payer: Self-pay | Admitting: *Deleted

## 2016-08-28 DIAGNOSIS — R079 Chest pain, unspecified: Secondary | ICD-10-CM

## 2016-08-29 ENCOUNTER — Telehealth (HOSPITAL_COMMUNITY): Payer: Self-pay | Admitting: *Deleted

## 2016-08-29 ENCOUNTER — Other Ambulatory Visit: Payer: Self-pay

## 2016-08-29 NOTE — Telephone Encounter (Signed)
.  Attempted to call patient regarding upcoming appointment- x2, no answer.  Patrick [name]

## 2016-09-03 ENCOUNTER — Encounter (HOSPITAL_COMMUNITY): Payer: Self-pay | Admitting: Urology

## 2016-09-03 ENCOUNTER — Ambulatory Visit (HOSPITAL_COMMUNITY): Payer: Commercial Managed Care - HMO | Attending: Cardiovascular Disease

## 2016-09-03 DIAGNOSIS — R079 Chest pain, unspecified: Secondary | ICD-10-CM | POA: Insufficient documentation

## 2016-09-03 LAB — MYOCARDIAL PERFUSION IMAGING
LV dias vol: 159 mL (ref 62–150)
LV sys vol: 60 mL
Peak HR: 101 {beats}/min
RATE: 0.32
Rest HR: 65 {beats}/min
SDS: 0
SRS: 0
SSS: 0
TID: 0.98

## 2016-09-03 MED ORDER — REGADENOSON 0.4 MG/5ML IV SOLN
0.4000 mg | Freq: Once | INTRAVENOUS | Status: AC
  Administered 2016-09-03: 0.4 mg via INTRAVENOUS

## 2016-09-03 MED ORDER — TECHNETIUM TC 99M TETROFOSMIN IV KIT
10.3000 | PACK | Freq: Once | INTRAVENOUS | Status: AC | PRN
  Administered 2016-09-03: 10.3 via INTRAVENOUS
  Filled 2016-09-03: qty 11

## 2016-09-03 MED ORDER — TECHNETIUM TC 99M TETROFOSMIN IV KIT
31.5000 | PACK | Freq: Once | INTRAVENOUS | Status: AC | PRN
  Administered 2016-09-03: 31.5 via INTRAVENOUS
  Filled 2016-09-03: qty 32

## 2016-09-03 NOTE — Progress Notes (Signed)
Spoke with patient's wife. States per Dr. Trula Slade, pt to take ASA day of surgery. Pt is at stress test currently for recent chest pain 2-3 weeks ago. Chart forwarded to anesthesia.

## 2016-09-03 NOTE — Progress Notes (Signed)
Anesthesia Chart Review: SAME DAY WORK-UP.  Patient is a 82 year old male scheduled for left arm first stage basilic vein transposition on 09/05/2016 by Dr. Trula Slade.  History includes CKD stage IV (not yet on HD), PVD, GERD, HTN, pulmonary hypertension ("very mild" by 11/2015 echo), dyslipidemia, prostate cancer s/p prostatectomy, former smoker, anemia, bradycardia. CAD is listed, but I do not see this formally documented in his cardiology notes, nor do I see prior cardiac cath.  - PCP is listed as Dr. Steele Sizer. - Cardiologist is Dr. Fransico Him. Last visit 08/27/16 with Truitt Merle, NP as a work-in for chest pain. Stress test was ordered which is being done today 09/03/16. - Nephrologist is Dr. Edrick Oh. - Urologist is Dr. Tresa Endo.  EKG 08/27/16: NSR.  Nuclear stress test 09/03/16:  The left ventricular ejection fraction is normal (55-65%).  Nuclear stress EF: 62%.  There was no ST segment deviation noted during stress.  The study is normal.  This is a low risk study.  Echo 11/13/15: Study Conclusions - Left ventricle: The cavity size was normal. There was moderate to   severe concentric hypertrophy. Systolic function was normal. The   estimated ejection fraction was in the range of 60% to 65%. Wall   motion was normal; there were no regional wall motion   abnormalities. Doppler parameters are consistent with abnormal   left ventricular relaxation (grade 1 diastolic dysfunction). - Aortic valve: Transvalvular velocity was within the normal range.   There was no stenosis. There was no regurgitation. - Mitral valve: Calcified annulus. Transvalvular velocity was   within the normal range. There was no evidence for stenosis.   There was no regurgitation. - Left atrium: The atrium was moderately dilated. - Right ventricle: The cavity size was normal. Wall thickness was   normal. Systolic function was normal. - Right atrium: The atrium was mildly dilated. - Atrial  septum: No defect or patent foramen ovale was identified   by color flow Doppler. - Tricuspid valve: There was mild regurgitation. - Inferior vena cava: The vessel was normal in size. The   respirophasic diameter changes were in the normal range (= 50%),   consistent with normal central venous pressure. - Pericardium, extracardiac: A trivial pericardial effusion was   identified. (By Dr. Mickel Fuchs notation on 11/13/15, "Very mild pulmonary HTN with PASP 10mmHg - this is much improved from last echo with PASP 74mmHg. No further w/u at this time."  Carotid U/S 11/18/05: No evidence of hemodynamically significant carotid stenosis.  08/26/16 labs noted. He is for ISTAT on the day of surgery.   With low risk stress test today I would anticipate that he could proceed as planned if no progressive symptoms and labs acceptable.  George Hugh [facility] Short Stay Center/Anesthesiology Phone [phone] 09/03/2016 5:50 PM

## 2016-09-05 ENCOUNTER — Ambulatory Visit (HOSPITAL_COMMUNITY)
Admission: RE | Admit: 2016-09-05 | Discharge: 2016-09-05 | Disposition: A | Discharge status: Home / Self Care | Payer: Commercial Managed Care - HMO | Source: Ambulatory Visit | Attending: Surgery | Admitting: Surgery

## 2016-09-05 ENCOUNTER — Encounter (HOSPITAL_COMMUNITY)
Admission: RE | Disposition: A | Discharge status: Home / Self Care | Payer: Self-pay | Source: Ambulatory Visit | Attending: Surgery

## 2016-09-05 ENCOUNTER — Ambulatory Visit (HOSPITAL_COMMUNITY): Payer: Commercial Managed Care - HMO | Admitting: Vascular Surgery

## 2016-09-05 ENCOUNTER — Other Ambulatory Visit: Payer: Self-pay

## 2016-09-05 ENCOUNTER — Encounter (HOSPITAL_COMMUNITY): Payer: Self-pay | Admitting: *Deleted

## 2016-09-05 DIAGNOSIS — Z48812 Encounter for surgical aftercare following surgery on the circulatory system: Secondary | ICD-10-CM

## 2016-09-05 DIAGNOSIS — I739 Peripheral vascular disease, unspecified: Secondary | ICD-10-CM | POA: Diagnosis not present

## 2016-09-05 DIAGNOSIS — Z8546 Personal history of malignant neoplasm of prostate: Secondary | ICD-10-CM | POA: Diagnosis not present

## 2016-09-05 DIAGNOSIS — E7800 Pure hypercholesterolemia, unspecified: Secondary | ICD-10-CM | POA: Diagnosis not present

## 2016-09-05 DIAGNOSIS — Z87891 Personal history of nicotine dependence: Secondary | ICD-10-CM | POA: Insufficient documentation

## 2016-09-05 DIAGNOSIS — I272 Pulmonary hypertension, unspecified: Secondary | ICD-10-CM | POA: Insufficient documentation

## 2016-09-05 DIAGNOSIS — N185 Chronic kidney disease, stage 5: Secondary | ICD-10-CM | POA: Diagnosis not present

## 2016-09-05 DIAGNOSIS — N184 Chronic kidney disease, stage 4 (severe): Secondary | ICD-10-CM

## 2016-09-05 DIAGNOSIS — I129 Hypertensive chronic kidney disease with stage 1 through stage 4 chronic kidney disease, or unspecified chronic kidney disease: Secondary | ICD-10-CM | POA: Diagnosis not present

## 2016-09-05 DIAGNOSIS — D631 Anemia in chronic kidney disease: Secondary | ICD-10-CM | POA: Diagnosis not present

## 2016-09-05 DIAGNOSIS — K219 Gastro-esophageal reflux disease without esophagitis: Secondary | ICD-10-CM | POA: Insufficient documentation

## 2016-09-05 DIAGNOSIS — Z79899 Other long term (current) drug therapy: Secondary | ICD-10-CM | POA: Insufficient documentation

## 2016-09-05 DIAGNOSIS — Z7982 Long term (current) use of aspirin: Secondary | ICD-10-CM | POA: Diagnosis not present

## 2016-09-05 DIAGNOSIS — N189 Chronic kidney disease, unspecified: Secondary | ICD-10-CM | POA: Diagnosis not present

## 2016-09-05 DIAGNOSIS — N2581 Secondary hyperparathyroidism of renal origin: Secondary | ICD-10-CM | POA: Insufficient documentation

## 2016-09-05 HISTORY — PX: BASCILIC VEIN TRANSPOSITION: SHX5742

## 2016-09-05 LAB — POCT I-STAT 4, (NA,K, GLUC, HGB,HCT)
GLUCOSE: 91 mg/dL (ref 65–99)
HEMATOCRIT: 35 % — AB (ref 39.0–52.0)
Hemoglobin: 11.9 g/dL — ABNORMAL LOW (ref 13.0–17.0)
POTASSIUM: 4.4 mmol/L (ref 3.5–5.1)
Sodium: 141 mmol/L (ref 135–145)

## 2016-09-05 SURGERY — TRANSPOSITION, VEIN, BASILIC
Anesthesia: Monitor Anesthesia Care | Site: Arm Upper | Laterality: Left

## 2016-09-05 MED ORDER — LIDOCAINE HCL (CARDIAC) 20 MG/ML IV SOLN
INTRAVENOUS | Status: DC | PRN
  Administered 2016-09-05: 60 mg via INTRATRACHEAL

## 2016-09-05 MED ORDER — DEXTROSE 5 % IV SOLN
INTRAVENOUS | Status: AC
  Filled 2016-09-05: qty 1.5

## 2016-09-05 MED ORDER — LIDOCAINE HCL (PF) 1 % IJ SOLN
INTRAMUSCULAR | Status: AC
  Filled 2016-09-05: qty 30

## 2016-09-05 MED ORDER — OXYCODONE-ACETAMINOPHEN 5-325 MG PO TABS: 1.0000 | ORAL_TABLET | Freq: Four times a day (QID) | ORAL | 0 refills | Status: DC | PRN

## 2016-09-05 MED ORDER — PROPOFOL 10 MG/ML IV BOLUS
INTRAVENOUS | Status: AC
  Filled 2016-09-05: qty 20

## 2016-09-05 MED ORDER — SODIUM CHLORIDE 0.9 % IV SOLN
INTRAVENOUS | Status: DC
  Administered 2016-09-05: 08:00:00 via INTRAVENOUS

## 2016-09-05 MED ORDER — SODIUM CHLORIDE 0.9 % IV SOLN: INTRAVENOUS | Status: DC

## 2016-09-05 MED ORDER — CHLORHEXIDINE GLUCONATE CLOTH 2 % EX PADS: 6.0000 | MEDICATED_PAD | Freq: Once | CUTANEOUS | Status: DC

## 2016-09-05 MED ORDER — ONDANSETRON HCL 4 MG/2ML IJ SOLN: 4.0000 mg | Freq: Once | INTRAMUSCULAR | Status: DC | PRN

## 2016-09-05 MED ORDER — ONDANSETRON HCL 4 MG/2ML IJ SOLN
INTRAMUSCULAR | Status: AC
  Filled 2016-09-05: qty 2

## 2016-09-05 MED ORDER — 0.9 % SODIUM CHLORIDE (POUR BTL) OPTIME
TOPICAL | Status: DC | PRN
  Administered 2016-09-05: 1000 mL

## 2016-09-05 MED ORDER — PROPOFOL 500 MG/50ML IV EMUL
INTRAVENOUS | Status: DC | PRN
  Administered 2016-09-05: 50 ug/kg/min via INTRAVENOUS

## 2016-09-05 MED ORDER — HEMOSTATIC AGENTS (NO CHARGE) OPTIME
TOPICAL | Status: DC | PRN
  Administered 2016-09-05: 1 via TOPICAL

## 2016-09-05 MED ORDER — HEPARIN SODIUM (PORCINE) 5000 UNIT/ML IJ SOLN
INTRAMUSCULAR | Status: DC | PRN
  Administered 2016-09-05: 500 mL

## 2016-09-05 MED ORDER — LIDOCAINE 2% (20 MG/ML) 5 ML SYRINGE
INTRAMUSCULAR | Status: AC
  Filled 2016-09-05: qty 5

## 2016-09-05 MED ORDER — FENTANYL CITRATE (PF) 100 MCG/2ML IJ SOLN
INTRAMUSCULAR | Status: DC | PRN
Start: 2016-09-05 — End: 2016-09-05
  Administered 2016-09-05: 25 ug via INTRAVENOUS
  Administered 2016-09-05: 50 ug via INTRAVENOUS

## 2016-09-05 MED ORDER — MEPERIDINE HCL 25 MG/ML IJ SOLN: 6.2500 mg | INTRAMUSCULAR | Status: DC | PRN

## 2016-09-05 MED ORDER — DEXTROSE 5 % IV SOLN
1.5000 g | INTRAVENOUS | Status: AC
  Administered 2016-09-05: 1.5 g via INTRAVENOUS

## 2016-09-05 MED ORDER — LIDOCAINE HCL (PF) 1 % IJ SOLN
INTRAMUSCULAR | Status: DC | PRN
  Administered 2016-09-05: 19 mL via SUBCUTANEOUS

## 2016-09-05 MED ORDER — HYDROMORPHONE HCL 1 MG/ML IJ SOLN: 0.2500 mg | INTRAMUSCULAR | Status: DC | PRN

## 2016-09-05 MED ORDER — FENTANYL CITRATE (PF) 100 MCG/2ML IJ SOLN
INTRAMUSCULAR | Status: AC
  Filled 2016-09-05: qty 2

## 2016-09-05 SURGICAL SUPPLY — 38 items
ARMBAND PINK RESTRICT EXTREMIT (MISCELLANEOUS) ×3 IMPLANT
CANISTER SUCTION 2500CC (MISCELLANEOUS) ×3 IMPLANT
CLIP TI MEDIUM 24 (CLIP) IMPLANT
CLIP TI MEDIUM 6 (CLIP) ×3 IMPLANT
CLIP TI WIDE RED SMALL 24 (CLIP) ×3 IMPLANT
CLIP TI WIDE RED SMALL 6 (CLIP) ×3 IMPLANT
COVER PROBE W GEL 5X96 (DRAPES) ×3 IMPLANT
DERMABOND ADVANCED (GAUZE/BANDAGES/DRESSINGS) ×2
DERMABOND ADVANCED .7 DNX12 (GAUZE/BANDAGES/DRESSINGS) ×1 IMPLANT
ELECT REM PT RETURN 9FT ADLT (ELECTROSURGICAL) ×3
ELECTRODE REM PT RTRN 9FT ADLT (ELECTROSURGICAL) ×1 IMPLANT
GLOVE BIO SURGEON STRL SZ 6.5 (GLOVE) ×2 IMPLANT
GLOVE BIO SURGEONS STRL SZ 6.5 (GLOVE) ×1
GLOVE BIOGEL PI IND STRL 6.5 (GLOVE) ×5 IMPLANT
GLOVE BIOGEL PI IND STRL 7.5 (GLOVE) ×1 IMPLANT
GLOVE BIOGEL PI INDICATOR 6.5 (GLOVE) ×10
GLOVE BIOGEL PI INDICATOR 7.5 (GLOVE) ×2
GLOVE INDICATOR 7.0 STRL GRN (GLOVE) ×3 IMPLANT
GLOVE SURG SS PI 6.5 STRL IVOR (GLOVE) ×3 IMPLANT
GLOVE SURG SS PI 7.0 STRL IVOR (GLOVE) ×3 IMPLANT
GLOVE SURG SS PI 7.5 STRL IVOR (GLOVE) ×3 IMPLANT
GOWN STRL REUS W/ TWL LRG LVL3 (GOWN DISPOSABLE) ×3 IMPLANT
GOWN STRL REUS W/ TWL XL LVL3 (GOWN DISPOSABLE) ×2 IMPLANT
GOWN STRL REUS W/TWL LRG LVL3 (GOWN DISPOSABLE) ×6
GOWN STRL REUS W/TWL XL LVL3 (GOWN DISPOSABLE) ×4
HEMOSTAT SNOW SURGICEL 2X4 (HEMOSTASIS) ×3 IMPLANT
KIT BASIN OR (CUSTOM PROCEDURE TRAY) ×3 IMPLANT
KIT ROOM TURNOVER OR (KITS) ×3 IMPLANT
NS IRRIG 1000ML POUR BTL (IV SOLUTION) ×3 IMPLANT
PACK CV ACCESS (CUSTOM PROCEDURE TRAY) ×3 IMPLANT
PAD ARMBOARD 7.5X6 YLW CONV (MISCELLANEOUS) ×6 IMPLANT
SUT PROLENE 6 0 CC (SUTURE) ×6 IMPLANT
SUT SILK 2 0 SH (SUTURE) IMPLANT
SUT VIC AB 3-0 SH 27 (SUTURE) ×2
SUT VIC AB 3-0 SH 27X BRD (SUTURE) ×1 IMPLANT
SUT VICRYL 4-0 PS2 18IN ABS (SUTURE) ×3 IMPLANT
UNDERPAD 30X30 (UNDERPADS AND DIAPERS) ×3 IMPLANT
WATER STERILE IRR 1000ML POUR (IV SOLUTION) ×3 IMPLANT

## 2016-09-05 NOTE — Op Note (Signed)
    Patient name: Patrick [name]. MRN: [id number] DOB: May 19, 1932 Sex: male  09/05/2016 Pre-operative Diagnosis: Chronic renal insufficiency Post-operative diagnosis:  Same Surgeon:  [name] Assistants:  Silva Bandy Procedure:   Left first stage BVT Anesthesia:  MAC Blood Loss:  See anesthesia record Specimens:  none  Findings:  Left basilic vein dilated nicely to 3-4 mm  Indications:  The patient is in need of HD access.  He is not on dialysis  Procedure:  The patient was identified in the holding area and taken to [place] 12  The patient was then placed supine on the table. MAC anesthesia was administered.  The patient was prepped and draped in the usual sterile fashion.  A time out was called and antibiotics were administered.  The basilic vein was evaluated woth u/s and measured about 2.59mm.  1% lidocaine was used for local anesthesia.  I made a transverse incision at the antecubital crease,  I first dissected out the brachial artery which was soft and of good caliber.  I the dissected out the basilic vein.  It was about 2.22mm in diameter.  Side branches were ligated, and the vein was marked for orientation.  I did not give heparin.  The vein was ligated distally.  It distended nicely with heparinized saline to 80mm.  The artery was then occluded and opened longitudinally.  The vein was cut to the appropriate length and spatulated to fit the size of the arteriotomy.  A running anastamosis was created with 6-0 prolene.  Prior to completion, the appropriate flushing maneuvers were performed and the anastamosis was competed.  There was a good thrill within the fistula and he had a palpable left radial pulse.  Hemostasis was achieved and the incision was closed with w layers of 2- vicryl followed by dermabond   Disposition:  To PACU in stable condition.   Theotis Burrow, M.D. [facility] Office: [phone] Pager:  [phone]

## 2016-09-05 NOTE — Transfer of Care (Signed)
Immediate Anesthesia Transfer of Care Note  Patient: Patrick [name].  Procedure(s) Performed: Procedure(s): FIRST STAGE BASILIC VEIN TRANSPOSITION left arm (Left)  Patient Location: PACU  Anesthesia Type:MAC  Level of Consciousness: awake, alert  and oriented  Airway & Oxygen Therapy: Patient Spontanous Breathing  Post-op Assessment: Report given to RN  Post vital signs: Reviewed and stable  Last Vitals:  Vitals:   09/05/16 0754 09/05/16 0757  BP: (!) 165/77   Pulse:  66  Resp:  18  Temp: 37 C     Last Pain:  Vitals:   09/05/16 0754  TempSrc: Oral  PainSc:       Patients Stated Pain Goal: 4 (25/36/64 4034)  Complications: No apparent anesthesia complications

## 2016-09-05 NOTE — Anesthesia Procedure Notes (Signed)
Procedure Name: MAC Date/Time: 09/05/2016 10:55 AM Performed by: Barrington Ellison Pre-anesthesia Checklist: Patient identified, Emergency Drugs available, Suction available, Patient being monitored and Timeout performed Patient Re-evaluated:Patient Re-evaluated prior to inductionOxygen Delivery Method: Simple face mask

## 2016-09-05 NOTE — Anesthesia Postprocedure Evaluation (Signed)
Anesthesia Post Note  Patient: Patrick [name].  Procedure(s) Performed: Procedure(s) (LRB): FIRST STAGE BASILIC VEIN TRANSPOSITION left arm (Left)  Patient location during evaluation: PACU Anesthesia Type: MAC Level of consciousness: awake and alert Pain management: pain level controlled Vital Signs Assessment: post-procedure vital signs reviewed and stable Respiratory status: spontaneous breathing, nonlabored ventilation, respiratory function stable and patient connected to nasal cannula oxygen Cardiovascular status: stable and blood pressure returned to baseline Anesthetic complications: no       Last Vitals:  Vitals:   09/05/16 1315 09/05/16 1337  BP: (!) 164/75 (!) 168/81  Pulse: (!) 58 65  Resp: 10   Temp: 36.4 C     Last Pain:  Vitals:   09/05/16 0754  TempSrc: Oral  PainSc:                  Deep Bonawitz DAVID

## 2016-09-05 NOTE — Anesthesia Preprocedure Evaluation (Signed)
Anesthesia Evaluation  Patient identified by MRN, date of birth, ID band Patient awake    Airway Mallampati: I  TM Distance: >3 FB Neck ROM: Full    Dental   Pulmonary former smoker,    Pulmonary exam normal        Cardiovascular hypertension, + CAD  Normal cardiovascular exam     Neuro/Psych    GI/Hepatic GERD  Medicated and Controlled,  Endo/Other    Renal/GU Renal InsufficiencyRenal disease     Musculoskeletal   Abdominal   Peds  Hematology   Anesthesia Other Findings   Reproductive/Obstetrics                             Anesthesia Physical Anesthesia Plan  ASA: III  Anesthesia Plan: MAC   Post-op Pain Management:    Induction: Intravenous  Airway Management Planned: Simple Face Mask  Additional Equipment:   Intra-op Plan:   Post-operative Plan:   Informed Consent: I have reviewed the patients History and Physical, chart, labs and discussed the procedure including the risks, benefits and alternatives for the proposed anesthesia with the patient or authorized representative who has indicated his/her understanding and acceptance.     Plan Discussed with: CRNA and Surgeon  Anesthesia Plan Comments:         Anesthesia Quick Evaluation

## 2016-09-06 ENCOUNTER — Encounter (HOSPITAL_COMMUNITY): Payer: Self-pay | Admitting: Surgery

## 2016-09-06 NOTE — H&P (View-Only) (Signed)
[facility]  Patient name: Patrick [name]. MRN: [id number] DOB: 05/29/1932 Sex: male  REFERRING PHYSICIAN: Dr. Justin Mend  REASON FOR CONSULT: Diaysis  HPI: Patrick [name]. is a 82 y.o. male, who is referred today for evaluation of chronic renal insufficiency.  His renal failure secondary to hypertension.  It is associated with secondary hyperparathyroidism as well as anemia.  His blood pressure is controlled.  He is on a statin for hypercholesterolemia.  He is a former smoker.  He is right-handed.  Past Medical History:  Diagnosis Date  . Anemia   . Bradycardia   . Chronic kidney disease   . GERD (gastroesophageal reflux disease)   . High cholesterol   . Hypertension   . IBS (irritable bowel syndrome)   . Prostate cancer ([place])   . Pulmonary HTN 11/05/2015  . PVD (peripheral vascular disease) ([facility])     Family History  Problem Relation Age of Onset  . CVA Mother   . Hypertension Mother   . Heart attack Father   . Heart disease Father     before age 41  . Hypertension Father   . Hepatitis C Sister     SOCIAL HISTORY: Social History   Social History  . Marital status: Married    Spouse name: N/A  . Number of children: N/A  . Years of education: N/A   Occupational History  . Not on file.   Social History Main Topics  . Smoking status: Former Smoker    Packs/day: 0.30    Years: 20.00    Types: Cigarettes    Quit date: 08/26/1978  . Smokeless tobacco: Never Used  . Alcohol use No  . Drug use: No  . Sexual activity: Not Currently   Other Topics Concern  . Not on file   Social History Narrative  . No narrative on file    Allergies  Allergen Reactions  . Ace Inhibitors     cough    Current Outpatient Prescriptions  Medication Sig Dispense Refill  . amLODipine-valsartan (EXFORGE) 10-320 MG tablet TAKE 1 TABLET EVERY DAY (REPLACES LOSARTAN AND AMLODIPINE) 90 tablet 1  . aspirin 81 MG  tablet Take 1 tablet by mouth daily.    [name] Kitchen atorvastatin (LIPITOR) 40 MG tablet Take 1 tablet (40 mg total) by mouth daily. 90 tablet 3  . cetirizine (ZYRTEC) 5 MG tablet Take 1 tablet by mouth daily. Reported on 09/27/2015    . Coenzyme Q10 (COQ-10) 100 MG capsule Take 1 capsule (100 mg total) by mouth daily. 30 capsule 0  . Elastic Bandages & Supports (MEDICAL COMPRESSION STOCKINGS) MISC 2 each by Does not apply route daily. 2 each 1  . fluticasone (FLONASE) 50 MCG/ACT nasal spray Place into the nose.    . hydrALAZINE (APRESOLINE) 50 MG tablet Take 1 tablet (50 mg total) by mouth 3 (three) times daily. 270 tablet 3  . latanoprost (XALATAN) 0.005 % ophthalmic solution     . vitamin C (ASCORBIC ACID) 500 MG tablet Take 500 mg by mouth daily.    . Vitamin E 400 UNITS CHEW Chew 1 tablet by mouth daily.      No current facility-administered medications for this visit.     REVIEW OF SYSTEMS:  [X]  denotes positive finding, [ ]  denotes negative finding Cardiac  Comments:  Chest pain or chest pressure:    Shortness of breath upon exertion:    Short of breath when lying flat:    Irregular  heart rhythm:        Vascular    Pain in calf, thigh, or hip brought on by ambulation:    Pain in feet at night that wakes you up from your sleep:     Blood clot in your veins:    Leg swelling:  x       Pulmonary    Oxygen at home:    Productive cough:     Wheezing:         Neurologic    Sudden weakness in arms or legs:     Sudden numbness in arms or legs:     Sudden onset of difficulty speaking or slurred speech:    Temporary loss of vision in one eye:     Problems with dizziness:         Gastrointestinal    Blood in stool:     Vomited blood:         Genitourinary    Burning when urinating:     Blood in urine:        Psychiatric    Major depression:         Hematologic    Bleeding problems:    Problems with blood clotting too easily:        Skin    Rashes or ulcers:          Constitutional    Fever or chills:      PHYSICAL EXAM: Vitals:   08/12/16 1227 08/12/16 1228  BP: (!) 154/80 (!) 145/77  Pulse: 60   Resp: 20   Temp: 97.4 F (36.3 C)   TempSrc: Oral   SpO2: 98%   Weight: 200 lb (90.7 kg)   Height: 6\' 2"  (1.88 m)     GENERAL: The patient is a well-nourished male, in no acute distress. The vital signs are documented above. CARDIAC: There is a regular rate and rhythm.  VASCULAR: Palpable left brachial and radial pulse.  Bilateral lower extremity edema PULMONARY: There is good air exchange bilaterally without wheezing or rales.  MUSCULOSKELETAL: There are no major deformities or cyanosis. NEUROLOGIC: No focal weakness or paresthesias are detected. SKIN: There are no ulcers or rashes noted. PSYCHIATRIC: The patient has a normal affect.  DATA:  The patient has a normal arterial Doppler study with brachial artery bifurcation at the antecubital crease. Vein mapping today shows an adequate cephalic vein bilaterally.  He does have a marginal left basilic vein.  ASSESSMENT AND PLAN: Chronic renal insufficiency: I discussed proceeding with a staged left basilic vein procedure.  I think his basilic vein is marginal, however given the fact that he is not yet on dialysis, I feel it is reasonable to attempt a stage basilic vein procedure.  The risks and benefits of the operation were discussed with the patient.  He would like to further discuss this with Dr. Livingston Diones before proceeding.  He will contact me with a time and date for surgery.   [name], MD [facility] [phone] Pager [phone]

## 2016-09-06 NOTE — Interval H&P Note (Signed)
History and Physical Interval Note:  09/06/2016 4:13 PM  Patrick [name].  has presented today for surgery, with the diagnosis of Stage IV Chronic Kidney Disease N18.4  The various methods of treatment have been discussed with the patient and family. After consideration of risks, benefits and other options for treatment, the patient has consented to  Procedure(s): FIRST STAGE BASILIC VEIN TRANSPOSITION left arm (Left) as a surgical intervention .  The patient's history has been reviewed, patient examined, no change in status, stable for surgery.  I have reviewed the patient's chart and labs.  Questions were answered to the patient's satisfaction.     [name]

## 2016-10-10 ENCOUNTER — Telehealth: Payer: Self-pay

## 2016-10-10 DIAGNOSIS — Z20828 Contact with and (suspected) exposure to other viral communicable diseases: Secondary | ICD-10-CM

## 2016-10-10 MED ORDER — OSELTAMIVIR PHOSPHATE 75 MG PO CAPS: 75.0000 mg | ORAL_CAPSULE | Freq: Every day | ORAL | 0 refills | Status: DC

## 2016-10-10 NOTE — Telephone Encounter (Signed)
After speaking with Dr. Ancil Boozer, Rx was submitted for Tamiflu 75mg  #10  Take 1 daily 0 refills.

## 2016-10-10 NOTE — Telephone Encounter (Signed)
Patient's wife called stating that he was exposed to the flu on yesterday and needs something for prevention  Please send Tamiflu to [facility]

## 2016-10-14 ENCOUNTER — Telehealth: Payer: Self-pay | Admitting: Family Medicine

## 2016-10-14 ENCOUNTER — Other Ambulatory Visit: Payer: Self-pay | Admitting: Family Medicine

## 2016-10-14 DIAGNOSIS — C61 Malignant neoplasm of prostate: Secondary | ICD-10-CM | POA: Diagnosis not present

## 2016-10-14 DIAGNOSIS — I10 Essential (primary) hypertension: Secondary | ICD-10-CM

## 2016-10-14 DIAGNOSIS — R9721 Rising PSA following treatment for malignant neoplasm of prostate: Secondary | ICD-10-CM | POA: Diagnosis not present

## 2016-10-14 MED ORDER — AMLODIPINE BESYLATE-VALSARTAN 10-320 MG PO TABS: ORAL_TABLET | ORAL | 1 refills | Status: DC

## 2016-10-14 NOTE — Telephone Encounter (Signed)
Refill has been sent.  °

## 2016-10-14 NOTE — Telephone Encounter (Signed)
Patient requesting refill of Exforge to [facility].

## 2016-10-14 NOTE — Telephone Encounter (Signed)
Pt requesting refill on amlodipine-valsartan 10-320mg . Please send to [facility] pt has 2 pills left

## 2016-10-15 ENCOUNTER — Encounter: Payer: Self-pay | Admitting: Surgery

## 2016-10-15 NOTE — Telephone Encounter (Signed)
Lvm informing patient

## 2016-10-21 ENCOUNTER — Encounter: Payer: Self-pay | Admitting: Surgery

## 2016-10-21 ENCOUNTER — Ambulatory Visit (HOSPITAL_COMMUNITY)
Admission: RE | Admit: 2016-10-21 | Discharge: 2016-10-21 | Disposition: A | Discharge status: Home / Self Care | Payer: Commercial Managed Care - HMO | Source: Ambulatory Visit | Attending: Surgery | Admitting: Surgery

## 2016-10-21 ENCOUNTER — Ambulatory Visit (INDEPENDENT_AMBULATORY_CARE_PROVIDER_SITE_OTHER): Payer: Self-pay | Admitting: Surgery

## 2016-10-21 VITALS — BP 155/81 | HR 73 | Temp 97.8°F | Resp 20 | Ht 71.0 in | Wt 193.6 lb

## 2016-10-21 DIAGNOSIS — N184 Chronic kidney disease, stage 4 (severe): Secondary | ICD-10-CM

## 2016-10-21 DIAGNOSIS — Z48812 Encounter for surgical aftercare following surgery on the circulatory system: Secondary | ICD-10-CM

## 2016-10-21 NOTE — Progress Notes (Signed)
Patient name: Patrick [name]. MRN: [id number] DOB: 1932-01-09 Sex: male  REASON FOR VISIT:     post op  HISTORY OF PRESENT ILLNESS:   Patrick [name]. is a 83 y.o. male who returns today for follow-up of his left basilic vein procedure that was performed on 09/05/2016.  This was a first stage procedure.  He denies swelling or symptoms of steal.  He is not yet on dialysis.   CURRENT MEDICATIONS:    Current Outpatient Prescriptions  Medication Sig Dispense Refill  . amLODipine-valsartan (EXFORGE) 10-320 MG tablet TAKE 1 TABLET EVERY DAY (REPLACES LOSARTAN AND AMLODIPINE) 90 tablet 1  . aspirin 81 MG tablet Take 1 tablet by mouth daily.    [name] Kitchen atorvastatin (LIPITOR) 40 MG tablet Take 1 tablet (40 mg total) by mouth daily. 90 tablet 3  . cetirizine (ZYRTEC) 5 MG tablet Take 1 tablet by mouth daily. Reported on 09/27/2015    . Coenzyme Q10 (COQ-10) 100 MG capsule Take 1 capsule (100 mg total) by mouth daily. 30 capsule 0  . dorzolamide (TRUSOPT) 2 % ophthalmic solution Place 1 drop into both eyes 2 (two) times daily.    . fluticasone (FLONASE) 50 MCG/ACT nasal spray Place 1 spray into the nose daily as needed for allergies.     . hydrALAZINE (APRESOLINE) 50 MG tablet Take 1 tablet (50 mg total) by mouth 3 (three) times daily. 270 tablet 3  . latanoprost (XALATAN) 0.005 % ophthalmic solution Place 1 drop into both eyes at bedtime.     [name] Kitchen oseltamivir (TAMIFLU) 75 MG capsule Take 1 capsule (75 mg total) by mouth daily. 10 capsule 0  . oxyCODONE-acetaminophen (PERCOCET/ROXICET) 5-325 MG tablet Take 1 tablet by mouth every 6 (six) hours as needed. 6 tablet 0  . vitamin C (ASCORBIC ACID) 500 MG tablet Take 500 mg by mouth daily.    . Vitamin E 400 UNITS CHEW Chew 1 tablet by mouth daily.      No current facility-administered medications for this visit.     REVIEW OF SYSTEMS:   [X]  denotes positive finding, [ ]  denotes negative finding Cardiac  Comments:   Chest pain or chest pressure:    Shortness of breath upon exertion:    Short of breath when lying flat:    Irregular heart rhythm:    Constitutional    Fever or chills:      PHYSICAL EXAM:   Vitals:   10/21/16 1557 10/21/16 1559  BP: (!) 153/80 (!) 155/81  Pulse: 73   Resp: 20   Temp: 97.8 F (36.6 C)   TempSrc: Oral   SpO2: 98%   Weight: 193 lb 9.6 oz (87.8 kg)   Height: 5\' 11"  (1.803 m)     GENERAL: The patient is a well-nourished male, in no acute distress. The vital signs are documented above. CARDIOVASCULAR: There is a regular rate and rhythm. PULMONARY: Non-labored respirations Excellent palpable thrill within left basilic vein    STUDIES:    Ultrasound evaluation shows excellent diameter vein, measuring between 0.51 and 0.94 cm.   MEDICAL ISSUES:    The patient will be scheduled for a second stage left basilic vein procedure.  He would like to accrue more time off from work before scheduling this.  I told him this needs to be done as quickly as possible.  He will contact my office with an appropriate date for him.  A Chin with he and his wife.  All questions were answered.  Rock Nephew  Trula Slade, MD [facility] [phone] Pager [phone]

## 2016-10-25 ENCOUNTER — Encounter: Payer: Self-pay | Admitting: *Deleted

## 2016-11-01 ENCOUNTER — Other Ambulatory Visit: Payer: Self-pay

## 2016-11-04 NOTE — Progress Notes (Signed)
Cardiology Office Note    Date:  11/05/2016   ID:  Patrick Turnley., DOB 1932/03/24, MRN [id number]  PCP:  Loistine Chance, MD  Cardiologist:  Fransico Him, MD   Chief Complaint  Patient presents with  . Follow-up    pulmonary HTN and HTN    History of Present Illness:  Patrick Tubbs. is a 83 y.o. male with a history of HTN, PVD, CKD stage IV, GERD, moderate pulmonary HTN (PASP 4mmHg on echo 2015 but resolved on echo 2017) and dyslipidemia who presents for followup of HTN. He got an AVF placed in December which is going to have to be revised.  He denies any chest pain or pressure, SOB, DOE, PND, orthopnea, dizziness, palpitations or syncope.  He has chronic LE edema which his wife thinks has gotten worse.     Past Medical History:  Diagnosis Date  . Anemia   . Bradycardia   . Chronic kidney disease   . GERD (gastroesophageal reflux disease)   . High cholesterol   . Hypertension   . IBS (irritable bowel syndrome)   . Prostate cancer ([place])   . Pulmonary HTN 11/05/2015   resolved on echo 11/2015  . PVD (peripheral vascular disease) ([place])     Past Surgical History:  Procedure Laterality Date  . BASCILIC VEIN TRANSPOSITION Left 09/05/2016   Procedure: FIRST STAGE BASILIC VEIN TRANSPOSITION left arm;  Surgeon: Serafina Mitchell, MD;  Location: [facility];  Service: Vascular;  Laterality: Left;  . PROSTATE SURGERY    . PROSTATECTOMY      Current Medications: Current Meds  Medication Sig  . amLODipine-valsartan (EXFORGE) 10-320 MG tablet TAKE 1 TABLET EVERY DAY (REPLACES LOSARTAN AND AMLODIPINE)  . aspirin 81 MG tablet Take 1 tablet by mouth daily.  [name] Kitchen atorvastatin (LIPITOR) 40 MG tablet Take 1 tablet (40 mg total) by mouth daily.  . cetirizine (ZYRTEC) 5 MG tablet Take 1 tablet by mouth daily. Reported on 09/27/2015  . dorzolamide (TRUSOPT) 2 % ophthalmic solution Place 1 drop into both eyes 2 (two) times daily.  . fluticasone (FLONASE) 50 MCG/ACT nasal spray Place 1  spray into the nose daily as needed for allergies.   . hydrALAZINE (APRESOLINE) 50 MG tablet Take 1 tablet (50 mg total) by mouth 3 (three) times daily.  [name] Kitchen latanoprost (XALATAN) 0.005 % ophthalmic solution Place 1 drop into both eyes at bedtime.   . vitamin C (ASCORBIC ACID) 500 MG tablet Take 500 mg by mouth daily.  . Vitamin E 400 UNITS CHEW Chew 1 tablet by mouth daily.     Allergies:   Ace inhibitors   Social History   Social History  . Marital status: Married    Spouse name: N/A  . Number of children: N/A  . Years of education: N/A   Social History Main Topics  . Smoking status: Former Smoker    Packs/day: 0.30    Years: 20.00    Types: Cigarettes    Quit date: 08/26/1978  . Smokeless tobacco: Never Used  . Alcohol use No  . Drug use: No  . Sexual activity: Not Currently   Other Topics Concern  . None   Social History Narrative  . None     Family History:  The patient's family history includes CVA in his mother; Heart attack in his father; Heart disease in his father; Hepatitis C in his sister; Hypertension in his father and mother.   ROS:   Please see the  history of present illness.    ROS All other systems reviewed and are negative.  No flowsheet data found.     PHYSICAL EXAM:   VS:  BP 140/68   Pulse 62   Ht 5\' 11"  (1.803 m)   Wt 202 lb 6.4 oz (91.8 kg)   BMI 28.23 kg/m    GEN: Well nourished, well developed, in no acute distress  HEENT: normal  Neck: no JVD, carotid bruits, or masses Cardiac: RRR; no murmurs, rubs, or gallops.  Trace edema.  Intact distal pulses bilaterally.  Respiratory:  clear to auscultation bilaterally, normal work of breathing GI: soft, nontender, nondistended, + BS MS: no deformity or atrophy  Skin: warm and dry, no rash Neuro:  Alert and Oriented x 3, Strength and sensation are intact Psych: euthymic mood, full affect  Wt Readings from Last 3 Encounters:  11/05/16 202 lb 6.4 oz (91.8 kg)  10/21/16 193 lb 9.6 oz (87.8  kg)  09/05/16 195 lb (88.5 kg)      Studies/Labs Reviewed:   EKG:  EKG is ordered today.  The ekg ordered today demonstrates NSR with nonspecific ST abnormality  Recent Labs: 08/26/2016: ALT 24; BUN 34; Creat 2.11; Magnesium 2.3; Platelets 172 09/05/2016: Hemoglobin 11.9; Potassium 4.4; Sodium 141   Lipid Panel    Component Value Date/Time   CHOL 127 08/26/2016 0859   CHOL 233 (H) 09/27/2015 0958   TRIG 48 08/26/2016 0859   HDL 72 08/26/2016 0859   HDL 78 09/27/2015 0958   CHOLHDL 1.8 08/26/2016 0859   VLDL 10 08/26/2016 0859   LDLCALC 45 08/26/2016 0859   LDLCALC 131 (H) 09/27/2015 0958    Additional studies/ records that were reviewed today include:  none    ASSESSMENT:    1. Pulmonary hypertension   2. Benign essential HTN   3. PVD (peripheral vascular disease) ([place])      PLAN:  In order of problems listed above:  1.  Pulmonary HTN - resolved.  VQ scan negative for PE.  PFTs were ordered last year but did not appear to be done.  Repeat 2D echo 11/2015 with no evidence of pulmonary HTN.  2.  HTN controlled - continue amlodipine/ARB/hydralazine - BMET in 08/2016 stable 3.  PVD - on statin.  Lipids followed by PCP.  4.  LE edema that is trace on exam today.  He is on his feet at work a lot and CKD is likely contributing as well.  I will give him a Rx for compression hose. We discussed the importance of following a low sodium diet.    Medication Adjustments/Labs and Tests Ordered: Current medicines are reviewed at length with the patient today.  Concerns regarding medicines are outlined above.  Medication changes, Labs and Tests ordered today are listed in the Patient Instructions below.  There are no Patient Instructions on file for this visit.   Signed, Fransico Him, MD  11/05/2016 8:14 AM    [facility] [place], [place], [place]  [address] Phone: [phone]; Fax: [phone]

## 2016-11-05 ENCOUNTER — Ambulatory Visit (INDEPENDENT_AMBULATORY_CARE_PROVIDER_SITE_OTHER): Payer: Medicare HMO | Admitting: Cardiology

## 2016-11-05 ENCOUNTER — Encounter: Payer: Self-pay | Admitting: Cardiology

## 2016-11-05 VITALS — BP 140/68 | HR 62 | Ht 71.0 in | Wt 202.4 lb

## 2016-11-05 DIAGNOSIS — I739 Peripheral vascular disease, unspecified: Secondary | ICD-10-CM | POA: Diagnosis not present

## 2016-11-05 DIAGNOSIS — I10 Essential (primary) hypertension: Secondary | ICD-10-CM | POA: Diagnosis not present

## 2016-11-05 DIAGNOSIS — I272 Pulmonary hypertension, unspecified: Secondary | ICD-10-CM | POA: Diagnosis not present

## 2016-11-05 MED ORDER — HYDRALAZINE HCL 50 MG PO TABS: 50.0000 mg | ORAL_TABLET | Freq: Three times a day (TID) | ORAL | 3 refills | Status: DC

## 2016-11-05 NOTE — Patient Instructions (Signed)
Medication Instructions:  Your physician recommends that you continue on your current medications as directed. Please refer to the Current Medication list given to you today.   Labwork: None  Testing/Procedures: None  Follow-Up: Your physician wants you to follow-up in: 1 year with Dr. Radford Pax. You will receive a reminder letter in the mail two months in advance. If you don't receive a letter, please call our office to schedule the follow-up appointment.   Any Other Special Instructions Will Be Listed Below (If Applicable). You have been given a prescription for compression hose.    If you need a refill on your cardiac medications before your next appointment, please call your pharmacy.

## 2016-11-13 ENCOUNTER — Encounter (HOSPITAL_COMMUNITY): Payer: Self-pay | Admitting: *Deleted

## 2016-11-13 NOTE — Progress Notes (Signed)
Spoke with pt's wife, Altha Harm for pre-op call. She denies any cardiac history for pt and no recent chest pain or sob. Pt is not diabetic.

## 2016-11-14 ENCOUNTER — Encounter (HOSPITAL_COMMUNITY): Payer: Self-pay | Admitting: Certified Registered Nurse Anesthetist

## 2016-11-14 ENCOUNTER — Ambulatory Visit (HOSPITAL_COMMUNITY)
Admission: RE | Admit: 2016-11-14 | Discharge: 2016-11-14 | Disposition: A | Discharge status: Home / Self Care | Payer: Medicare HMO | Source: Ambulatory Visit | Attending: Surgery | Admitting: Surgery

## 2016-11-14 ENCOUNTER — Encounter (HOSPITAL_COMMUNITY)
Admission: RE | Disposition: A | Discharge status: Home / Self Care | Payer: Self-pay | Source: Ambulatory Visit | Attending: Surgery

## 2016-11-14 ENCOUNTER — Ambulatory Visit (HOSPITAL_COMMUNITY): Payer: Medicare HMO | Admitting: Anesthesiology

## 2016-11-14 DIAGNOSIS — I120 Hypertensive chronic kidney disease with stage 5 chronic kidney disease or end stage renal disease: Secondary | ICD-10-CM | POA: Insufficient documentation

## 2016-11-14 DIAGNOSIS — I739 Peripheral vascular disease, unspecified: Secondary | ICD-10-CM | POA: Insufficient documentation

## 2016-11-14 DIAGNOSIS — N184 Chronic kidney disease, stage 4 (severe): Secondary | ICD-10-CM | POA: Diagnosis not present

## 2016-11-14 DIAGNOSIS — Z79899 Other long term (current) drug therapy: Secondary | ICD-10-CM | POA: Insufficient documentation

## 2016-11-14 DIAGNOSIS — Z7982 Long term (current) use of aspirin: Secondary | ICD-10-CM | POA: Diagnosis not present

## 2016-11-14 DIAGNOSIS — E785 Hyperlipidemia, unspecified: Secondary | ICD-10-CM | POA: Diagnosis not present

## 2016-11-14 DIAGNOSIS — N186 End stage renal disease: Secondary | ICD-10-CM | POA: Insufficient documentation

## 2016-11-14 DIAGNOSIS — Z87891 Personal history of nicotine dependence: Secondary | ICD-10-CM | POA: Insufficient documentation

## 2016-11-14 DIAGNOSIS — I129 Hypertensive chronic kidney disease with stage 1 through stage 4 chronic kidney disease, or unspecified chronic kidney disease: Secondary | ICD-10-CM | POA: Diagnosis not present

## 2016-11-14 DIAGNOSIS — N185 Chronic kidney disease, stage 5: Secondary | ICD-10-CM | POA: Diagnosis not present

## 2016-11-14 HISTORY — PX: BASCILIC VEIN TRANSPOSITION: SHX5742

## 2016-11-14 HISTORY — DX: Unspecified osteoarthritis, unspecified site: M19.90

## 2016-11-14 HISTORY — DX: Pneumonia, unspecified organism: J18.9

## 2016-11-14 LAB — POCT I-STAT 4, (NA,K, GLUC, HGB,HCT)
GLUCOSE: 91 mg/dL (ref 65–99)
HEMATOCRIT: 31 % — AB (ref 39.0–52.0)
Hemoglobin: 10.5 g/dL — ABNORMAL LOW (ref 13.0–17.0)
POTASSIUM: 4.4 mmol/L (ref 3.5–5.1)
Sodium: 142 mmol/L (ref 135–145)

## 2016-11-14 SURGERY — TRANSPOSITION, VEIN, BASILIC
Anesthesia: General | Site: Arm Upper | Laterality: Left

## 2016-11-14 MED ORDER — LIDOCAINE 2% (20 MG/ML) 5 ML SYRINGE
INTRAMUSCULAR | Status: DC | PRN
  Administered 2016-11-14: 60 mg via INTRAVENOUS

## 2016-11-14 MED ORDER — EPHEDRINE 5 MG/ML INJ
INTRAVENOUS | Status: AC
  Filled 2016-11-14: qty 20

## 2016-11-14 MED ORDER — CHLORHEXIDINE GLUCONATE CLOTH 2 % EX PADS: 6.0000 | MEDICATED_PAD | Freq: Once | CUTANEOUS | Status: DC

## 2016-11-14 MED ORDER — FENTANYL CITRATE (PF) 100 MCG/2ML IJ SOLN
INTRAMUSCULAR | Status: DC | PRN
  Administered 2016-11-14 (×2): 25 ug via INTRAVENOUS
  Administered 2016-11-14: 50 ug via INTRAVENOUS

## 2016-11-14 MED ORDER — LIDOCAINE 2% (20 MG/ML) 5 ML SYRINGE
INTRAMUSCULAR | Status: AC
  Filled 2016-11-14: qty 10

## 2016-11-14 MED ORDER — LIDOCAINE-EPINEPHRINE (PF) 1 %-1:200000 IJ SOLN
INTRAMUSCULAR | Status: AC
  Filled 2016-11-14: qty 30

## 2016-11-14 MED ORDER — ONDANSETRON HCL 4 MG/2ML IJ SOLN
INTRAMUSCULAR | Status: AC
  Filled 2016-11-14: qty 2

## 2016-11-14 MED ORDER — PROPOFOL 10 MG/ML IV BOLUS
INTRAVENOUS | Status: DC | PRN
  Administered 2016-11-14: 10 mg via INTRAVENOUS
  Administered 2016-11-14: 100 mg via INTRAVENOUS

## 2016-11-14 MED ORDER — 0.9 % SODIUM CHLORIDE (POUR BTL) OPTIME
TOPICAL | Status: DC | PRN
  Administered 2016-11-14: 1000 mL

## 2016-11-14 MED ORDER — HEMOSTATIC AGENTS (NO CHARGE) OPTIME
TOPICAL | Status: DC | PRN
  Administered 2016-11-14: 1 via TOPICAL

## 2016-11-14 MED ORDER — PHENYLEPHRINE 40 MCG/ML (10ML) SYRINGE FOR IV PUSH (FOR BLOOD PRESSURE SUPPORT)
PREFILLED_SYRINGE | INTRAVENOUS | Status: AC
  Filled 2016-11-14: qty 20

## 2016-11-14 MED ORDER — DEXTROSE 5 % IV SOLN
1.5000 g | INTRAVENOUS | Status: AC
  Administered 2016-11-14: 1.5 g via INTRAVENOUS
  Filled 2016-11-14: qty 1.5

## 2016-11-14 MED ORDER — SODIUM CHLORIDE 0.9 % IV SOLN
INTRAVENOUS | Status: DC
  Administered 2016-11-14: 09:00:00 via INTRAVENOUS

## 2016-11-14 MED ORDER — PROPOFOL 10 MG/ML IV BOLUS
INTRAVENOUS | Status: AC
  Filled 2016-11-14: qty 40

## 2016-11-14 MED ORDER — ROCURONIUM BROMIDE 50 MG/5ML IV SOSY
PREFILLED_SYRINGE | INTRAVENOUS | Status: AC
  Filled 2016-11-14: qty 5

## 2016-11-14 MED ORDER — SODIUM CHLORIDE 0.9 % IV SOLN
INTRAVENOUS | Status: DC | PRN
  Administered 2016-11-14: 10:00:00

## 2016-11-14 MED ORDER — ONDANSETRON HCL 4 MG/2ML IJ SOLN
INTRAMUSCULAR | Status: DC | PRN
  Administered 2016-11-14: 4 mg via INTRAVENOUS

## 2016-11-14 MED ORDER — FENTANYL CITRATE (PF) 100 MCG/2ML IJ SOLN
INTRAMUSCULAR | Status: AC
  Filled 2016-11-14: qty 2

## 2016-11-14 MED ORDER — SUCCINYLCHOLINE CHLORIDE 200 MG/10ML IV SOSY
PREFILLED_SYRINGE | INTRAVENOUS | Status: AC
  Filled 2016-11-14: qty 20

## 2016-11-14 MED ORDER — EPHEDRINE SULFATE 50 MG/ML IJ SOLN
INTRAMUSCULAR | Status: DC | PRN
  Administered 2016-11-14 (×2): 5 mg via INTRAVENOUS
  Administered 2016-11-14: 10 mg via INTRAVENOUS

## 2016-11-14 MED ORDER — OXYCODONE-ACETAMINOPHEN 5-325 MG PO TABS: 1.0000 | ORAL_TABLET | Freq: Four times a day (QID) | ORAL | 0 refills | Status: DC | PRN

## 2016-11-14 MED ORDER — FENTANYL CITRATE (PF) 100 MCG/2ML IJ SOLN: 25.0000 ug | INTRAMUSCULAR | Status: DC | PRN

## 2016-11-14 SURGICAL SUPPLY — 37 items
ARMBAND PINK RESTRICT EXTREMIT (MISCELLANEOUS) ×3 IMPLANT
CANISTER SUCT 3000ML PPV (MISCELLANEOUS) ×3 IMPLANT
CLIP TI MEDIUM 24 (CLIP) IMPLANT
CLIP TI MEDIUM 6 (CLIP) IMPLANT
CLIP TI WIDE RED SMALL 24 (CLIP) IMPLANT
CLIP TI WIDE RED SMALL 6 (CLIP) IMPLANT
COVER PROBE W GEL 5X96 (DRAPES) ×3 IMPLANT
DERMABOND ADVANCED (GAUZE/BANDAGES/DRESSINGS) ×2
DERMABOND ADVANCED .7 DNX12 (GAUZE/BANDAGES/DRESSINGS) ×1 IMPLANT
ELECT REM PT RETURN 9FT ADLT (ELECTROSURGICAL) ×3
ELECTRODE REM PT RTRN 9FT ADLT (ELECTROSURGICAL) ×1 IMPLANT
GLOVE BIO SURGEON STRL SZ 6.5 (GLOVE) ×2 IMPLANT
GLOVE BIO SURGEON STRL SZ7 (GLOVE) ×9 IMPLANT
GLOVE BIO SURGEONS STRL SZ 6.5 (GLOVE) ×1
GLOVE BIOGEL PI IND STRL 7.0 (GLOVE) ×1 IMPLANT
GLOVE BIOGEL PI IND STRL 7.5 (GLOVE) ×1 IMPLANT
GLOVE BIOGEL PI INDICATOR 7.0 (GLOVE) ×2
GLOVE BIOGEL PI INDICATOR 7.5 (GLOVE) ×2
GLOVE ECLIPSE 6.5 STRL STRAW (GLOVE) ×3 IMPLANT
GLOVE SURG SS PI 7.5 STRL IVOR (GLOVE) ×3 IMPLANT
GOWN STRL REUS W/ TWL LRG LVL3 (GOWN DISPOSABLE) ×2 IMPLANT
GOWN STRL REUS W/ TWL XL LVL3 (GOWN DISPOSABLE) ×1 IMPLANT
GOWN STRL REUS W/TWL LRG LVL3 (GOWN DISPOSABLE) ×4
GOWN STRL REUS W/TWL XL LVL3 (GOWN DISPOSABLE) ×2
HEMOSTAT SNOW SURGICEL 2X4 (HEMOSTASIS) IMPLANT
KIT BASIN OR (CUSTOM PROCEDURE TRAY) ×3 IMPLANT
KIT ROOM TURNOVER OR (KITS) ×3 IMPLANT
NS IRRIG 1000ML POUR BTL (IV SOLUTION) ×3 IMPLANT
PACK CV ACCESS (CUSTOM PROCEDURE TRAY) ×3 IMPLANT
PAD ARMBOARD 7.5X6 YLW CONV (MISCELLANEOUS) ×6 IMPLANT
SUT PROLENE 6 0 CC (SUTURE) ×6 IMPLANT
SUT SILK 2 0 SH (SUTURE) IMPLANT
SUT VIC AB 3-0 SH 27 (SUTURE) ×4
SUT VIC AB 3-0 SH 27X BRD (SUTURE) ×2 IMPLANT
SUT VICRYL 4-0 PS2 18IN ABS (SUTURE) ×6 IMPLANT
UNDERPAD 30X30 (UNDERPADS AND DIAPERS) ×3 IMPLANT
WATER STERILE IRR 1000ML POUR (IV SOLUTION) ×3 IMPLANT

## 2016-11-14 NOTE — Discharge Instructions (Signed)
° ° °  11/14/2016 Zaden Sako [id number] 1932-08-14  Surgeon(s): Serafina Mitchell, MD  Procedure(s): BASILIC VEIN TRANSPOSITION-LEFT ARM 2ND STAGE  x Do not stick fistula for 8 weeks

## 2016-11-14 NOTE — Op Note (Signed)
    Patient name: Patrick [name]. MRN: [id number] DOB: December 16, 1931 Sex: male  11/14/2016 Pre-operative Diagnosis: ESRD Post-operative diagnosis:  Same Surgeon:  [name] Assistants:  Charlton Haws Procedure:   2nd stage left basilic vein transposition Anesthesia:  Gen. Blood Loss:  See anesthesia record Specimens:  None  Findings:  Excellent basilic vein measuring greater than 6 mm throughout.  It was tunneled laterally.  I did a primary end to end anastomosis.  Indications:  The patient is status post first stage basilic vein creation.  His 6 week follow-up ultrasound showed an excellent vein.  He is here today for a second stage.  Procedure:  The patient was identified in the holding area and taken to [place] [facility]  The patient was then placed supine on the table. general anesthesia was administered.  The patient was prepped and draped in the usual sterile fashion.  A time out was called and antibiotics were administered.  Ultrasound was used to identify the location of the basilic vein.  I made 2 longitudinal incisions in the upper arm.  Through these incisions, the basilic vein was dissected free.  I had to ligate multiple branches for full mobilization.  The nerve was protected and visualized throughout.  The basilic vein was dissected out from the antecubital crease up to the axilla.  Once I had it fully mobilized, I marked it with a ink pen for orientation.  I then used a curved tunneler to create a more anterior and lateral tunnel in the upper arm.  I then placed baby Belenda Cruise clamps on the fistula and divided it.  The vein was then brought through the previously created tunnel.  I then spatulated both ends of the vein and performed a running end to end anastomosis with 6-0 Prolene. Once completed and flushing was done, the anastomosis was secured.  The clamps were released.  There was excellent thrill within the fistula.  He continued to have a palpable radial pulse. Surgicell  Was  used for hemostasis.  The incision was closed by reapproximating the subcutaneous tissue with a 3-0 Vicryl and the skin with a 4-0 Vicryl followed by Dermabond.     Disposition:  To PACU stable   V. [name], M.D. [facility] Office: [phone] Pager:  [phone]

## 2016-11-14 NOTE — Interval H&P Note (Signed)
History and Physical Interval Note:  11/14/2016 9:01 AM  Patrick Phil Dopp.  has presented today for surgery, with the diagnosis of Stage 4 Chronic Kidney Disease N18.4  The various methods of treatment have been discussed with the patient and family. After consideration of risks, benefits and other options for treatment, the patient has consented to  Procedure(s): Adelino (Left) as a surgical intervention .  The patient's history has been reviewed, patient examined, no change in status, stable for surgery.  I have reviewed the patient's chart and labs.  Questions were answered to the patient's satisfaction.     [name]

## 2016-11-14 NOTE — Anesthesia Postprocedure Evaluation (Signed)
Anesthesia Post Note  Patient: Patrick [name].  Procedure(s) Performed: Procedure(s) (LRB): BASCILIC VEIN TRANSPOSITION-LEFT ARM 2ND STAGE (Left)  Patient location during evaluation: PACU Anesthesia Type: General Level of consciousness: awake and alert Pain management: pain level controlled Vital Signs Assessment: post-procedure vital signs reviewed and stable Respiratory status: spontaneous breathing, nonlabored ventilation and respiratory function stable Cardiovascular status: blood pressure returned to baseline and stable Postop Assessment: no signs of nausea or vomiting Anesthetic complications: no       Last Vitals:  Vitals:   11/14/16 0753 11/14/16 1115  BP: (!) 178/75 (!) 169/71  Pulse: 70 68  Resp: 18 12  Temp: 36.7 C 36.4 C    Last Pain:  Vitals:   11/14/16 0753  TempSrc: Oral                 Jametta Moorehead,W. EDMOND

## 2016-11-14 NOTE — Transfer of Care (Signed)
Immediate Anesthesia Transfer of Care Note  Patient: Patrick [name].  Procedure(s) Performed: Procedure(s): BASCILIC VEIN TRANSPOSITION-LEFT ARM 2ND STAGE (Left)  Patient Location: PACU  Anesthesia Type:General  Level of Consciousness: awake, patient cooperative and responds to stimulation  Airway & Oxygen Therapy: Patient Spontanous Breathing  Post-op Assessment: Report given to RN and Post -op Vital signs reviewed and stable  Post vital signs: Reviewed and stable  Last Vitals:  Vitals:   11/14/16 0753  BP: (!) 178/75  Pulse: 70  Resp: 18  Temp: 36.7 C    Last Pain:  Vitals:   11/14/16 0753  TempSrc: Oral         Complications: No apparent anesthesia complications

## 2016-11-14 NOTE — Anesthesia Preprocedure Evaluation (Addendum)
Anesthesia Evaluation  Patient identified by MRN, date of birth, ID band Patient awake    Reviewed: Allergy & Precautions, H&P , NPO status , Patient's Chart, lab work & pertinent test results  Airway Mallampati: II  TM Distance: >3 FB Neck ROM: Full    Dental no notable dental hx. (+) Teeth Intact, Dental Advisory Given   Pulmonary neg pulmonary ROS, former smoker,    Pulmonary exam normal breath sounds clear to auscultation       Cardiovascular hypertension, Pt. on medications + Peripheral Vascular Disease   Rhythm:Regular Rate:Normal     Neuro/Psych negative neurological ROS  negative psych ROS   GI/Hepatic Neg liver ROS, GERD  ,  Endo/Other  negative endocrine ROS  Renal/GU Renal InsufficiencyRenal disease  negative genitourinary   Musculoskeletal  (+) Arthritis , Osteoarthritis,    Abdominal   Peds  Hematology negative hematology ROS (+) anemia ,   Anesthesia Other Findings   Reproductive/Obstetrics negative OB ROS                            Anesthesia Physical Anesthesia Plan  ASA: III  Anesthesia Plan: General   Post-op Pain Management:    Induction: Intravenous  Airway Management Planned: LMA  Additional Equipment:   Intra-op Plan:   Post-operative Plan: Extubation in OR  Informed Consent: I have reviewed the patients History and Physical, chart, labs and discussed the procedure including the risks, benefits and alternatives for the proposed anesthesia with the patient or authorized representative who has indicated his/her understanding and acceptance.   Dental advisory given  Plan Discussed with: CRNA  Anesthesia Plan Comments:         Anesthesia Quick Evaluation

## 2016-11-14 NOTE — H&P (View-Only) (Signed)
Patient name: Patrick [name]. MRN: [id number] DOB: [birth year] Sex: male  REASON FOR VISIT:     post op  HISTORY OF PRESENT ILLNESS:   Patrick [name]. is a 83 y.o. male who returns today for follow-up of his left basilic vein procedure that was performed on 09/05/2016.  This was a first stage procedure.  He denies swelling or symptoms of steal.  He is not yet on dialysis.   CURRENT MEDICATIONS:    Current Outpatient Prescriptions  Medication Sig Dispense Refill  . amLODipine-valsartan (EXFORGE) 10-320 MG tablet TAKE 1 TABLET EVERY DAY (REPLACES LOSARTAN AND AMLODIPINE) 90 tablet 1  . aspirin 81 MG tablet Take 1 tablet by mouth daily.    [name] Kitchen atorvastatin (LIPITOR) 40 MG tablet Take 1 tablet (40 mg total) by mouth daily. 90 tablet 3  . cetirizine (ZYRTEC) 5 MG tablet Take 1 tablet by mouth daily. Reported on 09/27/2015    . Coenzyme Q10 (COQ-10) 100 MG capsule Take 1 capsule (100 mg total) by mouth daily. 30 capsule 0  . dorzolamide (TRUSOPT) 2 % ophthalmic solution Place 1 drop into both eyes 2 (two) times daily.    . fluticasone (FLONASE) 50 MCG/ACT nasal spray Place 1 spray into the nose daily as needed for allergies.     . hydrALAZINE (APRESOLINE) 50 MG tablet Take 1 tablet (50 mg total) by mouth 3 (three) times daily. 270 tablet 3  . latanoprost (XALATAN) 0.005 % ophthalmic solution Place 1 drop into both eyes at bedtime.     [name] Kitchen oseltamivir (TAMIFLU) 75 MG capsule Take 1 capsule (75 mg total) by mouth daily. 10 capsule 0  . oxyCODONE-acetaminophen (PERCOCET/ROXICET) 5-325 MG tablet Take 1 tablet by mouth every 6 (six) hours as needed. 6 tablet 0  . vitamin C (ASCORBIC ACID) 500 MG tablet Take 500 mg by mouth daily.    . Vitamin E 400 UNITS CHEW Chew 1 tablet by mouth daily.      No current facility-administered medications for this visit.     REVIEW OF SYSTEMS:   [X]  denotes positive finding, [ ]  denotes negative finding Cardiac  Comments:   Chest pain or chest pressure:    Shortness of breath upon exertion:    Short of breath when lying flat:    Irregular heart rhythm:    Constitutional    Fever or chills:      PHYSICAL EXAM:   Vitals:   10/21/16 1557 10/21/16 1559  BP: (!) 153/80 (!) 155/81  Pulse: 73   Resp: 20   Temp: 97.8 F (36.6 C)   TempSrc: Oral   SpO2: 98%   Weight: 193 lb 9.6 oz (87.8 kg)   Height: 5\' 11"  (1.803 m)     GENERAL: The patient is a well-nourished male, in no acute distress. The vital signs are documented above. CARDIOVASCULAR: There is a regular rate and rhythm. PULMONARY: Non-labored respirations Excellent palpable thrill within left basilic vein    STUDIES:    Ultrasound evaluation shows excellent diameter vein, measuring between 0.51 and 0.94 cm.   MEDICAL ISSUES:    The patient will be scheduled for a second stage left basilic vein procedure.  He would like to accrue more time off from work before scheduling this.  I told him this needs to be done as quickly as possible.  He will contact my office with an appropriate date for him.  A Chin with he and his wife.  All questions were answered.  Rock Nephew  Trula Slade, MD [facility] [phone] Pager [phone]

## 2016-11-15 ENCOUNTER — Encounter (HOSPITAL_COMMUNITY): Payer: Self-pay | Admitting: Surgery

## 2016-11-15 ENCOUNTER — Telehealth: Payer: Self-pay | Admitting: Surgery

## 2016-11-15 NOTE — Telephone Encounter (Signed)
-----   Message from Mena Goes, RN sent at 11/14/2016  3:01 PM EST ----- Regarding: may be duplicate   ----- Message ----- From: Gabriel Earing, PA-C Sent: 11/14/2016  11:14 AM To: Vvs Charge Pool  S/p 2nd stage left BVT 11/14/16.  F/u in 4 weeks with duplex.  Thanks, Aldona Bar

## 2016-11-15 NOTE — Telephone Encounter (Signed)
LVM on home # for appts on 4/13 [place] and 4/16 PO, lttrs mailed

## 2016-11-26 ENCOUNTER — Encounter: Payer: Self-pay | Admitting: Family Medicine

## 2016-11-26 ENCOUNTER — Ambulatory Visit (INDEPENDENT_AMBULATORY_CARE_PROVIDER_SITE_OTHER): Payer: Commercial Managed Care - HMO | Admitting: Family Medicine

## 2016-11-26 VITALS — BP 144/62 | HR 70 | Temp 97.9°F | Resp 16 | Ht 71.0 in | Wt 198.9 lb

## 2016-11-26 DIAGNOSIS — N2581 Secondary hyperparathyroidism of renal origin: Secondary | ICD-10-CM

## 2016-11-26 DIAGNOSIS — F0390 Unspecified dementia without behavioral disturbance: Secondary | ICD-10-CM | POA: Diagnosis not present

## 2016-11-26 DIAGNOSIS — C61 Malignant neoplasm of prostate: Secondary | ICD-10-CM

## 2016-11-26 DIAGNOSIS — I200 Unstable angina: Secondary | ICD-10-CM | POA: Diagnosis not present

## 2016-11-26 DIAGNOSIS — N184 Chronic kidney disease, stage 4 (severe): Secondary | ICD-10-CM | POA: Diagnosis not present

## 2016-11-26 DIAGNOSIS — I739 Peripheral vascular disease, unspecified: Secondary | ICD-10-CM

## 2016-11-26 DIAGNOSIS — I272 Pulmonary hypertension, unspecified: Secondary | ICD-10-CM

## 2016-11-26 DIAGNOSIS — I10 Essential (primary) hypertension: Secondary | ICD-10-CM

## 2016-11-26 DIAGNOSIS — E785 Hyperlipidemia, unspecified: Secondary | ICD-10-CM

## 2016-11-26 NOTE — Progress Notes (Signed)
Name: Patrick [name].   MRN: [id number]    DOB: Jan 08, 1932   Date:11/26/2016       Progress Note  Subjective  Chief Complaint  Chief Complaint  Patient presents with  . Hypertension    patient stated that it is well controlled with medication  . Chronic Kidney Disease  . Hyperlipidemia    some joint aches    HPI  HTN: He is Valsartan, Norvasc and Hydralazine. BP is not being checked at home lately, it was at goal during visit with cardiologist last month.  He has compression stocking hoses and is controlling the swelling on his legs.  He denies palpitation. No chest pain in the past 3 months, no SOB.He sees Dr. Radford Pax, cardiologist.   Angina: he was having weekly episodes back in December, but it has resolved since. Feeling well at this time.   Hand cramp: he has [facility], going to [facility] in [place]. He states the left hand cramps up about two or three times a week, improves when he shakes his arm.   Bone Pain: he has multiple bone scans and negative for metastasis. He is now seeing Dr. Shon Baton at [facility] as his Urologist. He states he is doing much, no longer having pain.   Pulmonary hypertension: discussed Revatio therapy, but wife and patient are worried about possible side effects   CKI stagecontinue follow up with nephrologist. -  Kidney , fistula placed on left upper arm, healing well.   Prostate Cancer: currently seeing Urologist, Dr. Shon Baton.  Dyslipidemia: still taking Atorvastatin , he states cramps has improved with CoQ10 supplementation   Dementia: wife states symptoms are stable, slightly forgetful over the past year, gradual process. He misplaced a few items, also has to be reminded to take medications. When driving with wife he has missed a couple of turns when driving, but not recently. At work he is also getting a little forgetful . MMS very low when checking 12/2015. They refuse referral to Neurologist or medication at this time.   Discussed importance of not driving, but wife said he is doing well at this time. Wife states he seems to be doing better at this time   Patient Active Problem List   Diagnosis Date Noted  . Secondary hyperparathyroidism ([place]) 11/26/2016  . Mild cognitive impairment 01/03/2016  . Elevated prostate specific antigen (PSA) 09/08/2015  . Bone pain 06/13/2015  . Spinal stenosis 06/13/2015  . OP (osteoporosis) 03/20/2015  . Allergic rhinitis 03/20/2015  . Lumbar canal stenosis 03/20/2015  . Vitamin D deficiency 03/20/2015  . Restless legs syndrome 03/20/2015  . Drug-induced gynecomastia 03/20/2015  . Diaphragmatic hernia 03/20/2015  . Acquired cyst of kidney 03/20/2015  . Left ventricular hypertrophy by electrocardiogram 03/20/2015  . Leg pain 03/20/2015  . Chronic kidney disease, stage IV (severe) ([place]) 03/20/2015  . History of pneumonia 03/20/2015  . Non-rheumatic tricuspid valve insufficiency 03/20/2015  . Dysmetabolic syndrome 69/48/5462  . Pulmonary hypertension 10/31/2014  . Benign essential HTN 07/29/2014  . Dyslipidemia 07/29/2014  . PVD (peripheral vascular disease) ([place])   . Anemia of chronic disease   . IBS (irritable bowel syndrome)   . Bradycardia   . ED (erectile dysfunction) of organic origin 06/29/2012  . Genuine stress incontinence, male 06/29/2012  . Malignant neoplasm of prostate ([place]) 06/29/2012    Past Surgical History:  Procedure Laterality Date  . BASCILIC VEIN TRANSPOSITION Left 09/05/2016   Procedure: FIRST STAGE BASILIC VEIN TRANSPOSITION left arm;  Surgeon: Serafina Mitchell,  MD;  Location: [place];  Service: Vascular;  Laterality: Left;  . BASCILIC VEIN TRANSPOSITION Left 11/14/2016   Procedure: BASCILIC VEIN TRANSPOSITION-LEFT ARM 2ND STAGE;  Surgeon: Serafina Mitchell, MD;  Location: [place];  Service: Vascular;  Laterality: Left;  . COLONOSCOPY    . PROSTATE SURGERY    . PROSTATECTOMY      Family History  Problem Relation Age of Onset  . CVA Mother   .  Hypertension Mother   . Heart attack Father   . Heart disease Father     before age 36  . Hypertension Father   . Hepatitis C Sister     Social History   Social History  . Marital status: Married    Spouse name: N/A  . Number of children: N/A  . Years of education: N/A   Occupational History  . Not on file.   Social History Main Topics  . Smoking status: Former Smoker    Packs/day: 0.30    Years: 20.00    Types: Cigarettes    Quit date: 08/26/1978  . Smokeless tobacco: Never Used  . Alcohol use No  . Drug use: No  . Sexual activity: Not Currently   Other Topics Concern  . Not on file   Social History Narrative  . No narrative on file     Current Outpatient Prescriptions:  .  amLODipine-valsartan (EXFORGE) 10-320 MG tablet, TAKE 1 TABLET EVERY DAY (REPLACES LOSARTAN AND AMLODIPINE), Disp: 90 tablet, Rfl: 1 .  aspirin 81 MG tablet, Take 81 mg by mouth daily. , Disp: , Rfl:  .  atorvastatin (LIPITOR) 40 MG tablet, Take 1 tablet (40 mg total) by mouth daily. (Patient taking differently: Take 40 mg by mouth every evening. ), Disp: 90 tablet, Rfl: 3 .  cetirizine (ZYRTEC) 10 MG tablet, Take 10 mg by mouth daily as needed for allergies., Disp: , Rfl:  .  dorzolamide (TRUSOPT) 2 % ophthalmic solution, Place 1 drop into both eyes 2 (two) times daily., Disp: , Rfl:  .  fluticasone (FLONASE) 50 MCG/ACT nasal spray, Place 1 spray into the nose daily as needed for allergies. , Disp: , Rfl:  .  hydrALAZINE (APRESOLINE) 50 MG tablet, Take 1 tablet (50 mg total) by mouth 3 (three) times daily., Disp: 270 tablet, Rfl: 3 .  latanoprost (XALATAN) 0.005 % ophthalmic solution, Place 1 drop into both eyes at bedtime. , Disp: , Rfl:  .  Multiple Vitamin (MULTIVITAMIN WITH MINERALS) TABS tablet, Take 1 tablet by mouth daily., Disp: , Rfl:  .  vitamin C (ASCORBIC ACID) 500 MG tablet, Take 500 mg by mouth daily., Disp: , Rfl:  .  Vitamin E 400 UNITS CHEW, Chew 400 Units by mouth daily. , Disp:  , Rfl:   Allergies  Allergen Reactions  . Ace Inhibitors Cough     ROS  Constitutional: Negative for fever or weight change.  Respiratory: Negative for cough and shortness of breath.   Cardiovascular: Negative for chest pain or palpitations.  Gastrointestinal: Negative for abdominal pain, no bowel changes.  Musculoskeletal: Negative for gait problem positive for  joint swelling - left knee.  Skin: Negative for rash.  Neurological: Negative for dizziness or headache.  No other specific complaints in a complete review of systems (except as listed in HPI above).  Objective    Vitals:   11/26/16 0742  BP: (!) 144/62  Pulse: 70  Resp: 16  Temp: 97.9 F (36.6 C)  TempSrc: Oral  SpO2: 98%  Weight: 198 lb 14.4 oz (90.2 kg)  Height: 5\' 11"  (1.803 m)     Body mass index is 27.74 kg/m.  Physical Exam  Constitutional: Patient appears well-developed and well-nourished.  No distress.  HEENT: head atraumatic, normocephalic, pupils equal and reactive to light,  neck supple, throat within normal limits Cardiovascular: Normal rate, regular rhythm and normal heart sounds.  No murmur heard. Trace BLE ankle edema. Pulmonary/Chest: Effort normal and breath sounds normal. No respiratory distress. Abdominal: Soft.  There is no tenderness. Psychiatric: Patient has a normal mood and affect. behavior is normal. Judgment and thought content normal. Muscular Skeletal: crepitus on both knees with extension of knees.  Skin: well healed scar on left inner arm  Recent Results (from the past 2160 hour(s))  Myocardial Perfusion Imaging     Status: None   Collection Time: 09/03/16 10:56 AM  Result Value Ref Range   Rest HR 65 bpm   Rest BP 217/79 mmHg   Peak HR 101 bpm   Peak BP 175/73 mmHg   SSS 0    SRS 0    SDS 0    LHR 0.32    TID 0.98    LV sys vol 60 mL   LV dias vol 159 62 - 150 mL  I-STAT 4, (NA,K, GLUC, HGB,HCT)     Status: Abnormal   Collection Time: 09/05/16  7:42 AM   Result Value Ref Range   Sodium 141 135 - 145 mmol/L   Potassium 4.4 3.5 - 5.1 mmol/L   Glucose, Bld 91 65 - 99 mg/dL   HCT 35.0 (L) 39.0 - 52.0 %   Hemoglobin 11.9 (L) 13.0 - 17.0 g/dL  I-STAT 4, (NA,K, GLUC, HGB,HCT)     Status: Abnormal   Collection Time: 11/14/16  7:42 AM  Result Value Ref Range   Sodium 142 135 - 145 mmol/L   Potassium 4.4 3.5 - 5.1 mmol/L   Glucose, Bld 91 65 - 99 mg/dL   HCT 31.0 (L) 39.0 - 52.0 %   Hemoglobin 10.5 (L) 13.0 - 17.0 g/dL      PHQ2/9: Depression screen [facility] 2/9 11/26/2016 04/22/2016 01/03/2016 09/27/2015 06/13/2015  Decreased Interest 0 0 0 0 0  Down, Depressed, Hopeless 0 0 0 0 0  PHQ - 2 Score 0 0 0 0 0     Fall Risk: Fall Risk  11/26/2016 04/22/2016 01/03/2016 09/27/2015 06/13/2015  Falls in the past year? No No No No No     Functional Status Survey: Is the patient deaf or have difficulty hearing?: No Does the patient have difficulty seeing, even when wearing glasses/contacts?: No Does the patient have difficulty concentrating, remembering, or making decisions?: No Does the patient have difficulty walking or climbing stairs?: No Does the patient have difficulty dressing or bathing?: No Does the patient have difficulty doing errands alone such as visiting a doctor's office or shopping?: No    Assessment & Plan  1. Benign essential HTN  Doing well  2. Dyslipidemia  Continue statin therapy   3. Pulmonary hypertension  Does not want medication at this time  4. PVD (peripheral vascular disease) ([facility])  Wearing compression stocking hoses  5. Chronic kidney disease, stage IV (severe) ([place])  Seeing nephrologist   6. Unstable angina pectoris [facility])  She sees Dr. Radford Pax, no episodes over the past 3 months  7. Prostate cancer [facility])  Continue follow up with Urologist   8. Secondary hyperparathyroidism ([place])  Likely secondary to renal disease  10. Dementia without behavioral disturbance, unspecified dementia  type  Stable, he does not want any further evaluation at this time

## 2016-12-19 DIAGNOSIS — H401112 Primary open-angle glaucoma, right eye, moderate stage: Secondary | ICD-10-CM | POA: Diagnosis not present

## 2016-12-19 DIAGNOSIS — H25813 Combined forms of age-related cataract, bilateral: Secondary | ICD-10-CM | POA: Diagnosis not present

## 2016-12-19 DIAGNOSIS — H401123 Primary open-angle glaucoma, left eye, severe stage: Secondary | ICD-10-CM | POA: Diagnosis not present

## 2016-12-19 DIAGNOSIS — L908 Other atrophic disorders of skin: Secondary | ICD-10-CM | POA: Diagnosis not present

## 2016-12-20 ENCOUNTER — Encounter (HOSPITAL_COMMUNITY): Payer: Medicare HMO

## 2016-12-23 ENCOUNTER — Encounter: Payer: Medicare HMO | Admitting: Surgery

## 2016-12-30 DIAGNOSIS — N2581 Secondary hyperparathyroidism of renal origin: Secondary | ICD-10-CM | POA: Diagnosis not present

## 2016-12-30 DIAGNOSIS — N183 Chronic kidney disease, stage 3 (moderate): Secondary | ICD-10-CM | POA: Diagnosis not present

## 2016-12-30 DIAGNOSIS — N189 Chronic kidney disease, unspecified: Secondary | ICD-10-CM | POA: Diagnosis not present

## 2016-12-30 DIAGNOSIS — D631 Anemia in chronic kidney disease: Secondary | ICD-10-CM | POA: Diagnosis not present

## 2017-01-01 ENCOUNTER — Encounter: Payer: Self-pay | Admitting: Surgery

## 2017-01-07 ENCOUNTER — Other Ambulatory Visit: Payer: Self-pay | Admitting: Surgery

## 2017-01-07 DIAGNOSIS — I739 Peripheral vascular disease, unspecified: Secondary | ICD-10-CM

## 2017-01-07 DIAGNOSIS — N189 Chronic kidney disease, unspecified: Secondary | ICD-10-CM

## 2017-01-13 ENCOUNTER — Ambulatory Visit (HOSPITAL_COMMUNITY)
Admission: RE | Admit: 2017-01-13 | Discharge: 2017-01-13 | Disposition: A | Discharge status: Home / Self Care | Payer: Medicare HMO | Source: Ambulatory Visit | Attending: Surgery | Admitting: Surgery

## 2017-01-13 ENCOUNTER — Encounter: Payer: Self-pay | Admitting: Surgery

## 2017-01-13 ENCOUNTER — Ambulatory Visit (INDEPENDENT_AMBULATORY_CARE_PROVIDER_SITE_OTHER): Payer: Self-pay | Admitting: Surgery

## 2017-01-13 VITALS — BP 164/69 | HR 57 | Temp 97.4°F | Resp 20 | Ht 71.0 in | Wt 195.0 lb

## 2017-01-13 DIAGNOSIS — I739 Peripheral vascular disease, unspecified: Secondary | ICD-10-CM

## 2017-01-13 DIAGNOSIS — N189 Chronic kidney disease, unspecified: Secondary | ICD-10-CM | POA: Diagnosis not present

## 2017-01-13 DIAGNOSIS — Z9889 Other specified postprocedural states: Secondary | ICD-10-CM | POA: Diagnosis not present

## 2017-01-13 DIAGNOSIS — N184 Chronic kidney disease, stage 4 (severe): Secondary | ICD-10-CM

## 2017-01-13 NOTE — Progress Notes (Signed)
   Patient name: Patrick [name]. MRN: [id number] DOB: 1932-07-25 Sex: male  REASON FOR VISIT:     post op CKD  HISTORY OF PRESENT ILLNESS:   Patrick Bonenberger Hamed Debella. is a 83 y.o. male turns today for follow-up.  He is status post second stage left basilic vein creation on 11/14/2016.  He denies any symptoms of steal.  He has no complaints.  CURRENT MEDICATIONS:    Current Outpatient Prescriptions  Medication Sig Dispense Refill  . amLODipine-valsartan (EXFORGE) 10-320 MG tablet TAKE 1 TABLET EVERY DAY (REPLACES LOSARTAN AND AMLODIPINE) 90 tablet 1  . aspirin 81 MG tablet Take 81 mg by mouth daily.     [name] Kitchen atorvastatin (LIPITOR) 40 MG tablet Take 1 tablet (40 mg total) by mouth daily. (Patient taking differently: Take 40 mg by mouth every evening. ) 90 tablet 3  . cetirizine (ZYRTEC) 10 MG tablet Take 10 mg by mouth daily as needed for allergies.    [name] Kitchen dorzolamide (TRUSOPT) 2 % ophthalmic solution Place 1 drop into both eyes 2 (two) times daily.    . fluticasone (FLONASE) 50 MCG/ACT nasal spray Place 1 spray into the nose daily as needed for allergies.     . hydrALAZINE (APRESOLINE) 50 MG tablet Take 1 tablet (50 mg total) by mouth 3 (three) times daily. 270 tablet 3  . latanoprost (XALATAN) 0.005 % ophthalmic solution Place 1 drop into both eyes at bedtime.     . Multiple Vitamin (MULTIVITAMIN WITH MINERALS) TABS tablet Take 1 tablet by mouth daily.    . vitamin C (ASCORBIC ACID) 500 MG tablet Take 500 mg by mouth daily.    . Vitamin E 400 UNITS CHEW Chew 400 Units by mouth daily.      No current facility-administered medications for this visit.     REVIEW OF SYSTEMS:   [X]  denotes positive finding, [ ]  denotes negative finding Cardiac  Comments:  Chest pain or chest pressure:    Shortness of breath upon exertion:    Short of breath when lying flat:    Irregular heart rhythm:    Constitutional    Fever or chills:      PHYSICAL EXAM:   Vitals:    01/13/17 0836  BP: (!) 157/70  Pulse: (!) 57  Resp: 20  Temp: 97.4 F (36.3 C)  TempSrc: Oral  SpO2: 99%  Weight: 195 lb (88.5 kg)  Height: 5\' 11"  (1.803 m)    GENERAL: The patient is a well-nourished male, in no acute distress. The vital signs are documented above. CARDIOVASCULAR: There is a regular rate and rhythm. PULMONARY: Non-labored respirations Excellent thrill within fistula.  STUDIES:   None   MEDICAL ISSUES:   Status post staged left basilic vein creation.  The patient is not yet on dialysis.  His fistula is ready for use.  [name], MD [facility] [phone] Pager [phone]

## 2017-02-11 DIAGNOSIS — H401132 Primary open-angle glaucoma, bilateral, moderate stage: Secondary | ICD-10-CM | POA: Diagnosis not present

## 2017-03-04 ENCOUNTER — Other Ambulatory Visit: Payer: Self-pay | Admitting: Family Medicine

## 2017-03-04 DIAGNOSIS — I10 Essential (primary) hypertension: Secondary | ICD-10-CM

## 2017-03-17 DIAGNOSIS — H401123 Primary open-angle glaucoma, left eye, severe stage: Secondary | ICD-10-CM | POA: Diagnosis not present

## 2017-03-28 ENCOUNTER — Encounter: Payer: Self-pay | Admitting: Family Medicine

## 2017-03-28 ENCOUNTER — Ambulatory Visit (INDEPENDENT_AMBULATORY_CARE_PROVIDER_SITE_OTHER): Payer: Medicare HMO | Admitting: Family Medicine

## 2017-03-28 VITALS — BP 144/70 | HR 60 | Temp 97.5°F | Resp 18 | Ht 71.0 in | Wt 192.4 lb

## 2017-03-28 DIAGNOSIS — I10 Essential (primary) hypertension: Secondary | ICD-10-CM | POA: Diagnosis not present

## 2017-03-28 DIAGNOSIS — N2581 Secondary hyperparathyroidism of renal origin: Secondary | ICD-10-CM

## 2017-03-28 DIAGNOSIS — I272 Pulmonary hypertension, unspecified: Secondary | ICD-10-CM

## 2017-03-28 DIAGNOSIS — I739 Peripheral vascular disease, unspecified: Secondary | ICD-10-CM

## 2017-03-28 DIAGNOSIS — I200 Unstable angina: Secondary | ICD-10-CM

## 2017-03-28 DIAGNOSIS — F0390 Unspecified dementia without behavioral disturbance: Secondary | ICD-10-CM | POA: Diagnosis not present

## 2017-03-28 DIAGNOSIS — N184 Chronic kidney disease, stage 4 (severe): Secondary | ICD-10-CM

## 2017-03-28 DIAGNOSIS — E785 Hyperlipidemia, unspecified: Secondary | ICD-10-CM

## 2017-03-28 DIAGNOSIS — C61 Malignant neoplasm of prostate: Secondary | ICD-10-CM

## 2017-03-28 LAB — LIPID PANEL
Cholesterol: 129 mg/dL (ref <200)
HDL: 64 mg/dL (ref >40)
LDL Cholesterol: 52 mg/dL (ref <100)
TRIGLYCERIDES: 67 mg/dL (ref <150)
Total CHOL/HDL Ratio: 2 Ratio (ref <5.0)
VLDL: 13 mg/dL (ref <30)

## 2017-03-28 LAB — COMPLETE METABOLIC PANEL WITH GFR
ALBUMIN: 4 g/dL (ref 3.6–5.1)
ALT: 23 U/L (ref 9–46)
AST: 18 U/L (ref 10–35)
Alkaline Phosphatase: 72 U/L (ref 40–115)
BILIRUBIN TOTAL: 0.6 mg/dL (ref 0.2–1.2)
BUN: 44 mg/dL — ABNORMAL HIGH (ref 7–25)
CALCIUM: 9.7 mg/dL (ref 8.6–10.3)
CO2: 22 mmol/L (ref 20–31)
Chloride: 111 mmol/L — ABNORMAL HIGH (ref 98–110)
Creat: 2.51 mg/dL — ABNORMAL HIGH (ref 0.70–1.11)
GFR, EST AFRICAN AMERICAN: 26 mL/min — AB (ref >=60)
GFR, EST NON AFRICAN AMERICAN: 23 mL/min — AB (ref >=60)
Glucose, Bld: 93 mg/dL (ref 65–99)
POTASSIUM: 5.7 mmol/L — AB (ref 3.5–5.3)
Sodium: 140 mmol/L (ref 135–146)
TOTAL PROTEIN: 7.1 g/dL (ref 6.1–8.1)

## 2017-03-28 MED ORDER — AMLODIPINE-OLMESARTAN 10-40 MG PO TABS: 1.0000 | ORAL_TABLET | Freq: Every day | ORAL | 1 refills | Status: DC

## 2017-03-28 NOTE — Progress Notes (Signed)
Name: Patrick [name].   MRN: [id number]    DOB: 10/01/1931   Date:03/28/2017       Progress Note  Subjective  Chief Complaint  Chief Complaint  Patient presents with  . Hypertension    4 month follow up    HPI  HTN: He is Valsartan, Norvasc and Hydralazine. BP and bp at home has been stable at 140's/70's He has compression stocking hoses and is controlling the swelling on his legs.  He denies palpitation. No chest pain in the past 6 months, no SOB.He sees Dr. Radford Pax, cardiologist.   Angina: he was having weekly episodes back in December, but it has resolved Feeling well at this time. No decrease in exercise tolerance, still working, about to lift up to 100 lbs at work.   Hand cramp: he has CKI, going to [facility] in [place], Dr. Justin Mend . He states the left hand cramps up about two or three times a week, improves when he shakes his arm.   Bone Pain: he has multiple bone scans and negative for metastasis.He states he is doing much, no longer having pain.   Pulmonary hypertension: discussed Revatio therapy, but wife and patient are worried about possible side effects   CKI stagecontinue follow up with nephrologist. - [facility] , fistula placed on left upper arm back in 11/2016 and is doing well, has not started HD yet.  Prostate Cancer: currently seeing Urologist at [facility], Dr. Rosana Hoes,   Dyslipidemia: still taking Atorvastatin , he states cramps has improved with CoQ10 supplementation  Dementia: wife states symptoms are stable, slightly forgetful over the past year, gradual process. He misplaced a few items, he is taking his own medications now. When driving with wife he has missed a couple of turns when driving, but not recently. At work he is also getting a little forgetful . MMS very low when checking 12/2015. They refuse referral to Neurologist or medication at this time.  Discussed importance of not driving, but wife said he is doing well at this time.  Wife states he seems to be doing better at this time. He refuses repeating MMS, not on medications and does not want to start at this time    Patient Active Problem List   Diagnosis Date Noted  . Secondary hyperparathyroidism ([place]) 11/26/2016  . Mild cognitive impairment 01/03/2016  . Elevated prostate specific antigen (PSA) 09/08/2015  . Bone pain 06/13/2015  . Spinal stenosis 06/13/2015  . OP (osteoporosis) 03/20/2015  . Allergic rhinitis 03/20/2015  . Lumbar canal stenosis 03/20/2015  . Vitamin D deficiency 03/20/2015  . Restless legs syndrome 03/20/2015  . Drug-induced gynecomastia 03/20/2015  . Diaphragmatic hernia 03/20/2015  . Acquired cyst of kidney 03/20/2015  . Left ventricular hypertrophy by electrocardiogram 03/20/2015  . Leg pain 03/20/2015  . Chronic kidney disease, stage IV (severe) ([place]) 03/20/2015  . History of pneumonia 03/20/2015  . Non-rheumatic tricuspid valve insufficiency 03/20/2015  . Dysmetabolic syndrome 29/79/8921  . Pulmonary hypertension ([place]) 10/31/2014  . Benign essential HTN 07/29/2014  . Dyslipidemia 07/29/2014  . PVD (peripheral vascular disease) ([place])   . Anemia of chronic disease   . IBS (irritable bowel syndrome)   . Bradycardia   . ED (erectile dysfunction) of organic origin 06/29/2012  . Genuine stress incontinence, male 06/29/2012  . Malignant neoplasm of prostate ([place]) 06/29/2012    Past Surgical History:  Procedure Laterality Date  . BASCILIC VEIN TRANSPOSITION Left 09/05/2016   Procedure: FIRST STAGE BASILIC VEIN TRANSPOSITION left  arm;  Surgeon: Serafina Mitchell, MD;  Location: [facility];  Service: Vascular;  Laterality: Left;  . BASCILIC VEIN TRANSPOSITION Left 11/14/2016   Procedure: BASCILIC VEIN TRANSPOSITION-LEFT ARM 2ND STAGE;  Surgeon: Serafina Mitchell, MD;  Location: [facility];  Service: Vascular;  Laterality: Left;  . COLONOSCOPY    . PROSTATE SURGERY    . PROSTATECTOMY      Family History  Problem Relation Age of Onset  .  CVA Mother   . Hypertension Mother   . Heart attack Father   . Heart disease Father        before age 53  . Hypertension Father   . Hepatitis C Sister     Social History   Social History  . Marital status: Married    Spouse name: N/A  . Number of children: N/A  . Years of education: N/A   Occupational History  . Not on file.   Social History Main Topics  . Smoking status: Former Smoker    Packs/day: 0.30    Years: 20.00    Types: Cigarettes    Quit date: 08/26/1978  . Smokeless tobacco: Never Used  . Alcohol use No  . Drug use: No  . Sexual activity: Not Currently   Other Topics Concern  . Not on file   Social History Narrative  . No narrative on file     Current Outpatient Prescriptions:  .  amLODipine-olmesartan (AZOR) 10-40 MG tablet, Take 1 tablet by mouth daily., Disp: 90 tablet, Rfl: 1 .  aspirin 81 MG tablet, Take 81 mg by mouth daily. , Disp: , Rfl:  .  atorvastatin (LIPITOR) 40 MG tablet, Take 1 tablet (40 mg total) by mouth daily. (Patient taking differently: Take 40 mg by mouth every evening. ), Disp: 90 tablet, Rfl: 3 .  cetirizine (ZYRTEC) 10 MG tablet, Take 10 mg by mouth daily as needed for allergies., Disp: , Rfl:  .  dorzolamide (TRUSOPT) 2 % ophthalmic solution, Place 1 drop into both eyes 2 (two) times daily., Disp: , Rfl:  .  dorzolamide-timolol (COSOPT) 22.3-6.8 MG/ML ophthalmic solution, , Disp: , Rfl: 1 .  fluticasone (FLONASE) 50 MCG/ACT nasal spray, Place 1 spray into the nose daily as needed for allergies. , Disp: , Rfl:  .  hydrALAZINE (APRESOLINE) 50 MG tablet, Take 1 tablet (50 mg total) by mouth 3 (three) times daily., Disp: 270 tablet, Rfl: 3 .  latanoprost (XALATAN) 0.005 % ophthalmic solution, Place 1 drop into both eyes at bedtime. , Disp: , Rfl:  .  Multiple Vitamin (MULTIVITAMIN WITH MINERALS) TABS tablet, Take 1 tablet by mouth daily., Disp: , Rfl:  .  vitamin C (ASCORBIC ACID) 500 MG tablet, Take 500 mg by mouth daily., Disp: ,  Rfl:  .  Vitamin E 400 UNITS CHEW, Chew 400 Units by mouth daily. , Disp: , Rfl:   Allergies  Allergen Reactions  . Ace Inhibitors Cough     ROS  Constitutional: Negative for fever or weight change.  Respiratory: Negative for cough and shortness of breath.   Cardiovascular: Negative for chest pain or palpitations.  Gastrointestinal: Negative for abdominal pain, no bowel changes.  Musculoskeletal: Negative for gait problem or joint swelling.  Skin: Negative for rash.  Neurological: Negative for dizziness or headache.  No other specific complaints in a complete review of systems (except as listed in HPI above).  Objective  Vitals:   03/28/17 0838  BP: (!) 144/70  Pulse: 60  Resp: 18  Temp: (!) 97.5 F (36.4 C)  SpO2: 98%  Weight: 192 lb 7 oz (87.3 kg)  Height: 5\' 11"  (1.803 m)    Body mass index is 26.84 kg/m.  Physical Exam  Constitutional: Patient appears well-developed and well-nourished. Overweight  No distress.  HEENT: head atraumatic, normocephalic, pupils equal and reactive to light,neck supple, throat within normal limits Cardiovascular: Normal rate, regular rhythm and normal heart sounds.  No murmur heard. Trace  BLE edema on ankles only , fistula on left arm Pulmonary/Chest: Effort normal and breath sounds normal. No respiratory distress. Abdominal: Soft.  There is no tenderness. Psychiatric: Patient has a normal mood and affect. behavior is normal. Judgment and thought content normal.  PHQ2/9: Depression screen [facility] 2/9 11/26/2016 04/22/2016 01/03/2016 09/27/2015 06/13/2015  Decreased Interest 0 0 0 0 0  Down, Depressed, Hopeless 0 0 0 0 0  PHQ - 2 Score 0 0 0 0 0     Fall Risk: Fall Risk  11/26/2016 04/22/2016 01/03/2016 09/27/2015 06/13/2015  Falls in the past year? No No No No No     Assessment & Plan  1. Benign essential HTN  At goal, changing from [place] to Azor because of recall - amLODipine-olmesartan (AZOR) 10-40 MG tablet; Take 1 tablet by  mouth daily.  Dispense: 90 tablet; Refill: 1 - COMPLETE METABOLIC PANEL WITH GFR  2. Dyslipidemia  - Lipid panel  3. Pulmonary hypertension ([place])  PASP 44mmHg on echo 2015 but resolved on echo 2017,  4. Chronic kidney disease, stage IV (severe) (HCC)  - COMPLETE METABOLIC PANEL WITH GFR  5. PVD (peripheral vascular disease) (H[facility])  Doing well on compression stocking hoses  6. Secondary hyperparathyroidism ([place])  Seeing Dr. Justin Mend but not able to see records,  7. Dementia without behavioral disturbance, unspecified dementia type  Stable, wife is here with him   8. Unstable angina pectoris ([place])  He is doing well, symptom free  9. Prostate cancer ([place])  Goes to [facility]

## 2017-04-28 DIAGNOSIS — D631 Anemia in chronic kidney disease: Secondary | ICD-10-CM | POA: Diagnosis not present

## 2017-04-28 DIAGNOSIS — N183 Chronic kidney disease, stage 3 (moderate): Secondary | ICD-10-CM | POA: Diagnosis not present

## 2017-04-28 DIAGNOSIS — N2581 Secondary hyperparathyroidism of renal origin: Secondary | ICD-10-CM | POA: Diagnosis not present

## 2017-04-28 DIAGNOSIS — N189 Chronic kidney disease, unspecified: Secondary | ICD-10-CM | POA: Diagnosis not present

## 2017-06-12 DIAGNOSIS — H401123 Primary open-angle glaucoma, left eye, severe stage: Secondary | ICD-10-CM | POA: Diagnosis not present

## 2017-06-12 DIAGNOSIS — H401112 Primary open-angle glaucoma, right eye, moderate stage: Secondary | ICD-10-CM | POA: Diagnosis not present

## 2017-06-12 DIAGNOSIS — H25813 Combined forms of age-related cataract, bilateral: Secondary | ICD-10-CM | POA: Diagnosis not present

## 2017-06-24 ENCOUNTER — Other Ambulatory Visit: Payer: Self-pay

## 2017-06-24 NOTE — Telephone Encounter (Signed)
Hypertension medication request: Hydralazine for a 10 day supply since his cardiologist is out of town. Dr. Radford Pax Cardiologist office stated Dr. Ancil Boozer could send in a 10 day supply since his other doctor is not available. Since he is out of this medication and he only needs a short term supply until [facility] can deliver it to the patient.   Last office visit pertaining to hypertension:  BP Readings from Last 3 Encounters:  03/28/17 (!) 144/70  01/13/17 (!) 164/69  11/26/16 (!) 144/62    Lab Results  Component Value Date   CREATININE 2.51 (H) 03/28/2017   BUN 44 (H) 03/28/2017   NA 140 03/28/2017   K 5.7 (H) 03/28/2017   CL 111 (H) 03/28/2017   CO2 22 03/28/2017

## 2017-06-25 ENCOUNTER — Other Ambulatory Visit: Payer: Self-pay

## 2017-06-25 MED ORDER — HYDRALAZINE HCL 50 MG PO TABS: 50.0000 mg | ORAL_TABLET | Freq: Three times a day (TID) | ORAL | 0 refills | Status: DC

## 2017-07-07 ENCOUNTER — Ambulatory Visit: Payer: Medicare HMO

## 2017-07-07 ENCOUNTER — Encounter: Payer: Self-pay | Admitting: Family Medicine

## 2017-07-07 ENCOUNTER — Ambulatory Visit (INDEPENDENT_AMBULATORY_CARE_PROVIDER_SITE_OTHER): Payer: Medicare HMO | Admitting: Family Medicine

## 2017-07-07 VITALS — BP 140/74 | HR 77 | Resp 14 | Ht 71.0 in | Wt 194.2 lb

## 2017-07-07 DIAGNOSIS — I739 Peripheral vascular disease, unspecified: Secondary | ICD-10-CM

## 2017-07-07 DIAGNOSIS — E785 Hyperlipidemia, unspecified: Secondary | ICD-10-CM

## 2017-07-07 DIAGNOSIS — I10 Essential (primary) hypertension: Secondary | ICD-10-CM | POA: Diagnosis not present

## 2017-07-07 DIAGNOSIS — N183 Chronic kidney disease, stage 3 unspecified: Secondary | ICD-10-CM

## 2017-07-07 DIAGNOSIS — D638 Anemia in other chronic diseases classified elsewhere: Secondary | ICD-10-CM | POA: Diagnosis not present

## 2017-07-07 DIAGNOSIS — F0390 Unspecified dementia without behavioral disturbance: Secondary | ICD-10-CM

## 2017-07-07 DIAGNOSIS — I272 Pulmonary hypertension, unspecified: Secondary | ICD-10-CM | POA: Diagnosis not present

## 2017-07-07 DIAGNOSIS — Z23 Encounter for immunization: Secondary | ICD-10-CM | POA: Diagnosis not present

## 2017-07-07 DIAGNOSIS — N2581 Secondary hyperparathyroidism of renal origin: Secondary | ICD-10-CM | POA: Diagnosis not present

## 2017-07-07 MED ORDER — DONEPEZIL HCL 10 MG PO TABS: 10.0000 mg | ORAL_TABLET | Freq: Every day | ORAL | 0 refills | Status: DC

## 2017-07-07 NOTE — Progress Notes (Signed)
Name: Patrick [name].   MRN: [id number]    DOB: Nov 05, 1931   Date:07/07/2017       Progress Note  Subjective  Chief Complaint  Chief Complaint  Patient presents with  . Hypertension  . Hyperlipidemia  . Hypothyroidism    HPI  HTN: He was on  Valsartan, Norvasc and Hydralazine, it was changed to Azor because of recall back in Summer 2018. BP and bp at home has been stable at 140's/70'sHe has compression stocking hoses and is controlling the swelling on his legs.He sees Dr. Radford Pax  Angina: no recent episodes of chest pain, no SOB, he denies orthopnea.   Bone Pain: he has multiple bone scans and negative for metastasis.He states he is doing much, no longer having pain. Symptoms were severe 2017  Pulmonary hypertension: discussed Revatio therapy, but wife and patient are worried about possible side effects, he sees cardiologist.   CKI stagecontinue follow up with nephrologist. - [facility] , fistula placed on left upper arm back in 11/2016 and is doing well, has not started HD yet, his kidney is actually better , went from stage IV to stage III  Prostate Cancer: currently seeing [facility]rologist [facility], Dr. Rosana Hoes.  Dyslipidemia: still taking Atorvastatin , he states cramps has improved with CoQ10 supplementation  Dementia: wife states symptoms are stable, slightly forgetful over the past year, gradual process. He misplaced a few items, he is taking his own medications now. When driving with wife he has missed a couple of turns when driving, but not recently. At work he is also getting a little forgetful . MMS very low when checked  12/2015. They refuse referral to Neurologist or medication at this time. Discussed importance of not driving, but wife said he is doing well at this time. Wife states he seems to be doing better at this time. He refuses repeating MMS, not on medications and does not want to start at this time   Patient Active Problem List   Diagnosis Date  Noted  . Secondary hyperparathyroidism ([place]) 11/26/2016  . Mild cognitive impairment 01/03/2016  . Elevated prostate specific antigen (PSA) 09/08/2015  . Bone pain 06/13/2015  . Spinal stenosis 06/13/2015  . OP (osteoporosis) 03/20/2015  . Allergic rhinitis 03/20/2015  . Lumbar canal stenosis 03/20/2015  . Vitamin D deficiency 03/20/2015  . Restless legs syndrome 03/20/2015  . Drug-induced gynecomastia 03/20/2015  . Diaphragmatic hernia 03/20/2015  . Acquired cyst of kidney 03/20/2015  . Left ventricular hypertrophy by electrocardiogram 03/20/2015  . Leg pain 03/20/2015  . Chronic kidney disease, stage IV (severe) ([place]) 03/20/2015  . History of pneumonia 03/20/2015  . Non-rheumatic tricuspid valve insufficiency 03/20/2015  . Dysmetabolic syndrome 26/94/8546  . Pulmonary hypertension ([place]) 10/31/2014  . Benign essential HTN 07/29/2014  . Dyslipidemia 07/29/2014  . PVD (peripheral vascular disease) ([place])   . Anemia of chronic disease   . IBS (irritable bowel syndrome)   . Bradycardia   . ED (erectile dysfunction) of organic origin 06/29/2012  . Genuine stress incontinence, male 06/29/2012  . Malignant neoplasm of prostate ([place]) 06/29/2012    Past Surgical History:  Procedure Laterality Date  . BASCILIC VEIN TRANSPOSITION Left 09/05/2016   Procedure: FIRST STAGE BASILIC VEIN TRANSPOSITION left arm;  Surgeon: Serafina Mitchell, MD;  Location: [facility];  Service: Vascular;  Laterality: Left;  . BASCILIC VEIN TRANSPOSITION Left 11/14/2016   Procedure: BASCILIC VEIN TRANSPOSITION-LEFT ARM 2ND STAGE;  Surgeon: Serafina Mitchell, MD;  Location: [facility];  Service:  Vascular;  Laterality: Left;  . COLONOSCOPY    . PROSTATE SURGERY    . PROSTATECTOMY      Family History  Problem Relation Age of Onset  . CVA Mother   . Hypertension Mother   . Heart attack Father   . Heart disease Father        before age 36  . Hypertension Father   . Hepatitis C Sister     Social History   Social  History  . Marital status: Married    Spouse name: N/A  . Number of children: N/A  . Years of education: N/A   Occupational History  . Not on file.   Social History Main Topics  . Smoking status: Former Smoker    Packs/day: 0.30    Years: 20.00    Types: Cigarettes    Quit date: 08/26/1978  . Smokeless tobacco: Never Used  . Alcohol use No  . Drug use: No  . Sexual activity: Not Currently   Other Topics Concern  . Not on file   Social History Narrative  . No narrative on file     Current Outpatient Prescriptions:  .  amLODipine-olmesartan (AZOR) 10-40 MG tablet, Take 1 tablet by mouth daily., Disp: 90 tablet, Rfl: 1 .  aspirin 81 MG tablet, Take 81 mg by mouth daily. , Disp: , Rfl:  .  atorvastatin (LIPITOR) 40 MG tablet, Take 1 tablet (40 mg total) by mouth daily. (Patient taking differently: Take 40 mg by mouth every evening. ), Disp: 90 tablet, Rfl: 3 .  cetirizine (ZYRTEC) 10 MG tablet, Take 10 mg by mouth daily as needed for allergies., Disp: , Rfl:  .  dorzolamide (TRUSOPT) 2 % ophthalmic solution, Place 1 drop into both eyes 2 (two) times daily., Disp: , Rfl:  .  dorzolamide-timolol (COSOPT) 22.3-6.8 MG/ML ophthalmic solution, , Disp: , Rfl: 1 .  fluticasone (FLONASE) 50 MCG/ACT nasal spray, Place 1 spray into the nose daily as needed for allergies. , Disp: , Rfl:  .  latanoprost (XALATAN) 0.005 % ophthalmic solution, Place 1 drop into both eyes at bedtime. , Disp: , Rfl:  .  Multiple Vitamin (MULTIVITAMIN WITH MINERALS) TABS tablet, Take 1 tablet by mouth daily., Disp: , Rfl:  .  vitamin C (ASCORBIC ACID) 500 MG tablet, Take 500 mg by mouth daily., Disp: , Rfl:  .  Vitamin E 400 UNITS CHEW, Chew 400 Units by mouth daily. , Disp: , Rfl:  .  hydrALAZINE (APRESOLINE) 50 MG tablet, Take 1 tablet (50 mg total) by mouth 3 (three) times daily., Disp: 30 tablet, Rfl: 0  Allergies  Allergen Reactions  . Ace Inhibitors Cough     ROS  Constitutional: Negative for fever  or weight change.  Respiratory: Negative for cough and shortness of breath.   Cardiovascular: Negative for chest pain or palpitations.  Gastrointestinal: Negative for abdominal pain, no bowel changes.  Musculoskeletal: Negative for gait problem or joint swelling.  Skin: Negative for rash.  Neurological: Negative for dizziness or headache.  No other specific complaints in a complete review of systems (except as listed in HPI above).   Objective  Vitals:   07/07/17 1333  BP: 140/74  Pulse: 77  Resp: 14  SpO2: 97%  Weight: 194 lb 3.2 oz (88.1 kg)  Height: 5\' 11"  (1.803 m)    Body mass index is 27.09 kg/m.  Physical Exam  Constitutional: Patient appears well-developed and well-nourished. Overweight. No distress.  HEENT: head atraumatic, normocephalic, pupils equal  and reactive to light,  neck supple, throat within normal limits Cardiovascular: Normal rate, regular rhythm and normal heart sounds.  No murmur heard. No BLE edema. Pulmonary/Chest: Effort normal and breath sounds normal. No respiratory distress. Abdominal: Soft.  There is no tenderness. Psychiatric: Patient has a normal mood and affect. behavior is normal. Judgment and thought content normal.  PHQ2/9: Depression screen [facility] 2/9 11/26/2016 04/22/2016 01/03/2016 09/27/2015 06/13/2015  Decreased Interest 0 0 0 0 0  Down, Depressed, Hopeless 0 0 0 0 0  PHQ - 2 Score 0 0 0 0 0     Fall Risk: Fall Risk  07/07/2017 11/26/2016 04/22/2016 01/03/2016 09/27/2015  Falls in the past year? No No No No No     Functional Status Survey: Is the patient deaf or have difficulty hearing?: No Does the patient have difficulty seeing, even when wearing glasses/contacts?: No Does the patient have difficulty concentrating, remembering, or making decisions?: No Does the patient have difficulty walking or climbing stairs?: No Does the patient have difficulty dressing or bathing?: No Does the patient have difficulty doing errands alone such as  visiting a doctor's office or shopping?: No    Assessment & Plan  1. Benign essential HTN  At goal today   2. Pulmonary hypertension ([place])  Keep follow up with cardiologist  3. Needs flu shot  - Flu vaccine HIGH DOSE PF  4. Dyslipidemia  Continue statin therapy   5. Chronic kidney disease, stage III (moderate) (HCC)  Sees Dr. Justin Mend  6. PVD (peripheral vascular disease) ([place])  No claudication at this time  7. Secondary hyperparathyroidism of renal origin [facility])  Getting labs done by Dr. Justin Mend  8. Anemia of chronic disease  Recent labs done by Dr. Justin Mend  9. Dementia without behavioral disturbance, unspecified dementia type  - donepezil (ARICEPT) 10 MG tablet; Take 1 tablet (10 mg total) by mouth at bedtime.  Dispense: 90 tablet; Refill: 0

## 2017-07-08 ENCOUNTER — Encounter: Payer: Self-pay | Admitting: Family Medicine

## 2017-07-28 DIAGNOSIS — R9721 Rising PSA following treatment for malignant neoplasm of prostate: Secondary | ICD-10-CM | POA: Diagnosis not present

## 2017-07-28 DIAGNOSIS — R972 Elevated prostate specific antigen [PSA]: Secondary | ICD-10-CM | POA: Diagnosis not present

## 2017-07-28 DIAGNOSIS — C61 Malignant neoplasm of prostate: Secondary | ICD-10-CM | POA: Diagnosis not present

## 2017-07-29 ENCOUNTER — Ambulatory Visit: Payer: Medicare HMO | Admitting: Family Medicine

## 2017-08-21 DIAGNOSIS — H25812 Combined forms of age-related cataract, left eye: Secondary | ICD-10-CM | POA: Diagnosis not present

## 2017-08-21 DIAGNOSIS — H409 Unspecified glaucoma: Secondary | ICD-10-CM | POA: Diagnosis not present

## 2017-08-27 ENCOUNTER — Other Ambulatory Visit: Payer: Self-pay | Admitting: Family Medicine

## 2017-08-27 DIAGNOSIS — F0390 Unspecified dementia without behavioral disturbance: Secondary | ICD-10-CM

## 2017-08-28 NOTE — Telephone Encounter (Signed)
Refill request for general medication: Donepezil 10 mg  Last office visit: 07/07/2017  Last physical exam: None indicated  Follow up visit: 11/05/2017

## 2017-09-11 HISTORY — PX: EYE SURGERY: SHX253

## 2017-09-13 ENCOUNTER — Other Ambulatory Visit: Payer: Self-pay | Admitting: Family Medicine

## 2017-09-13 DIAGNOSIS — I10 Essential (primary) hypertension: Secondary | ICD-10-CM

## 2017-11-05 ENCOUNTER — Ambulatory Visit: Payer: Medicare HMO | Admitting: Family Medicine

## 2017-11-05 ENCOUNTER — Encounter: Payer: Self-pay | Admitting: Family Medicine

## 2017-11-05 VITALS — BP 150/62 | HR 57 | Ht 71.0 in | Wt 190.5 lb

## 2017-11-05 DIAGNOSIS — I739 Peripheral vascular disease, unspecified: Secondary | ICD-10-CM | POA: Diagnosis not present

## 2017-11-05 DIAGNOSIS — E785 Hyperlipidemia, unspecified: Secondary | ICD-10-CM

## 2017-11-05 DIAGNOSIS — I10 Essential (primary) hypertension: Secondary | ICD-10-CM | POA: Diagnosis not present

## 2017-11-05 DIAGNOSIS — D638 Anemia in other chronic diseases classified elsewhere: Secondary | ICD-10-CM | POA: Diagnosis not present

## 2017-11-05 DIAGNOSIS — N183 Chronic kidney disease, stage 3 unspecified: Secondary | ICD-10-CM

## 2017-11-05 DIAGNOSIS — N2581 Secondary hyperparathyroidism of renal origin: Secondary | ICD-10-CM | POA: Diagnosis not present

## 2017-11-05 DIAGNOSIS — F0390 Unspecified dementia without behavioral disturbance: Secondary | ICD-10-CM

## 2017-11-05 DIAGNOSIS — I272 Pulmonary hypertension, unspecified: Secondary | ICD-10-CM

## 2017-11-05 DIAGNOSIS — I200 Unstable angina: Secondary | ICD-10-CM | POA: Diagnosis not present

## 2017-11-05 DIAGNOSIS — C61 Malignant neoplasm of prostate: Secondary | ICD-10-CM | POA: Diagnosis not present

## 2017-11-05 DIAGNOSIS — E559 Vitamin D deficiency, unspecified: Secondary | ICD-10-CM

## 2017-11-05 MED ORDER — ATORVASTATIN CALCIUM 40 MG PO TABS: 40.0000 mg | ORAL_TABLET | Freq: Every evening | ORAL | 3 refills | Status: DC

## 2017-11-05 NOTE — Patient Instructions (Signed)
Discuss revatio with Cardiologist, for history of pulmonary hypertension

## 2017-11-05 NOTE — Progress Notes (Signed)
Name: Patrick [name].   MRN: [id number]    DOB: [birth year]   Date:11/05/2017       Progress Note  Subjective  Chief Complaint  Chief Complaint  Patient presents with  . Hypertension  . Hyperlipidemia    HPI  HTN: He was on  Valsartan, Norvasc and Hydralazine, it was changed to Azor because of recall back in Summer 2018. BP and bp at home has been stable at 140's/70'sHe has compression stocking hoses and is controlling the swelling on his legs.He sees Dr. Radford Pax, he denies recent episode of chest pain or decrease in exercise tolerance. BP is elevated today, but wife states he usually take Hydralazine at lunch and did not have time to eat before visit today, but will take it when they get back at home  Angina: no recent episodes of chest pain, no SOB, he denies orthopnea.   Bone Pain: he has multiple bone scans and negative for metastasis.He states he is doing much, no longer having pain. Symptoms were severe 2017, since he has been off Lupron pain resolved  Pulmonary hypertension: discussed Revatio therapy, but wife and patient are worried about possible side effects, he sees cardiologist. His next appointment with Dr. Radford Pax is in April .   CKI stage III continue follow up with nephrologist. - [facility] , fistula placed on left upper arm back in 11/2016 and is doing well, has not started HD yet, his kidney iwent from stage IV to stage III, just being monitored at this time. Secondary hyperparathyroidism we will recheck labs today   Prostate Cancer: currently seeing Urologist at [facility], Dr. Rosana Hoes.  Dyslipidemia: still taking Atorvastatin , he states cramps has improved with CoQ10 supplementation  Dementia: wife states symptoms are stable, slightly forgetful over the past year, gradual process. He misplaced a few items, he is taking his own medications now. When driving with wife he has missed a couple of turns when driving. At work he is also getting a little  forgetful . MMS very low when checked  12/2015. They refuse referral to Neurologist or medication initially but agreed on starting medication Fall 2018, but wife did not noticed a difference.  Discussed importance of not driving, but wife said he is doing well at this time. He refuses repeating MMS, but wiling to see neurologist at this time  Claudication; doing well at this time, no pain , wear compression stocking hoses.   Patient Active Problem List   Diagnosis Date Noted  . Secondary hyperparathyroidism ([place]) 11/26/2016  . Mild cognitive impairment 01/03/2016  . Elevated prostate specific antigen (PSA) 09/08/2015  . Bone pain 06/13/2015  . Spinal stenosis 06/13/2015  . OP (osteoporosis) 03/20/2015  . Allergic rhinitis 03/20/2015  . Lumbar canal stenosis 03/20/2015  . Vitamin D deficiency 03/20/2015  . Restless legs syndrome 03/20/2015  . Drug-induced gynecomastia 03/20/2015  . Diaphragmatic hernia 03/20/2015  . Acquired cyst of kidney 03/20/2015  . Left ventricular hypertrophy by electrocardiogram 03/20/2015  . Leg pain 03/20/2015  . Chronic kidney disease, stage IV (severe) ([place]) 03/20/2015  . History of pneumonia 03/20/2015  . Non-rheumatic tricuspid valve insufficiency 03/20/2015  . Dysmetabolic syndrome 86/76/1950  . Pulmonary hypertension ([place]) 10/31/2014  . Benign essential HTN 07/29/2014  . Dyslipidemia 07/29/2014  . PVD (peripheral vascular disease) ([place])   . Anemia of chronic disease   . IBS (irritable bowel syndrome)   . Bradycardia   . ED (erectile dysfunction) of organic origin 06/29/2012  . Genuine stress incontinence,  male 06/29/2012  . Malignant neoplasm of prostate ([place]) 06/29/2012    Past Surgical History:  Procedure Laterality Date  . BASCILIC VEIN TRANSPOSITION Left 09/05/2016   Procedure: FIRST STAGE BASILIC VEIN TRANSPOSITION left arm;  Surgeon: Serafina Mitchell, MD;  Location: [place];  Service: Vascular;  Laterality: Left;  . BASCILIC VEIN  TRANSPOSITION Left 11/14/2016   Procedure: BASCILIC VEIN TRANSPOSITION-LEFT ARM 2ND STAGE;  Surgeon: Serafina Mitchell, MD;  Location: [place];  Service: Vascular;  Laterality: Left;  . COLONOSCOPY    . PROSTATE SURGERY    . PROSTATECTOMY      Family History  Problem Relation Age of Onset  . CVA Mother   . Hypertension Mother   . Heart attack Father   . Heart disease Father        before age 34  . Hypertension Father   . Hepatitis C Sister     Social History   Socioeconomic History  . Marital status: Married    Spouse name: Not on file  . Number of children: Not on file  . Years of education: Not on file  . Highest education level: Not on file  Social Needs  . Financial resource strain: Not on file  . Food insecurity - worry: Not on file  . Food insecurity - inability: Not on file  . Transportation needs - medical: Not on file  . Transportation needs - non-medical: Not on file  Occupational History  . Not on file  Tobacco Use  . Smoking status: Former Smoker    Packs/day: 0.30    Years: 20.00    Pack years: 6.00    Types: Cigarettes    Last attempt to quit: 08/26/1978    Years since quitting: 82.0  . Smokeless tobacco: Never Used  Substance and Sexual Activity  . Alcohol use: No    Alcohol/week: 0.0 oz  . Drug use: No  . Sexual activity: Not Currently  Other Topics Concern  . Not on file  Social History Narrative  . Not on file     Current Outpatient Medications:  .  amLODipine-olmesartan (AZOR) 10-40 MG tablet, TAKE 1 TABLET EVERY DAY (SINCE VALSARTAN RECALLED), Disp: 90 tablet, Rfl: 1 .  aspirin 81 MG tablet, Take 81 mg by mouth daily. , Disp: , Rfl:  .  atorvastatin (LIPITOR) 40 MG tablet, Take 1 tablet (40 mg total) by mouth daily. (Patient taking differently: Take 40 mg by mouth every evening. ), Disp: 90 tablet, Rfl: 3 .  cetirizine (ZYRTEC) 10 MG tablet, Take 10 mg by mouth daily as needed for allergies., Disp: , Rfl:  .  donepezil (ARICEPT) 10 MG tablet,  TAKE 1 TABLET AT BEDTIME, Disp: 90 tablet, Rfl: 1 .  dorzolamide-timolol (COSOPT) 22.3-6.8 MG/ML ophthalmic solution, , Disp: , Rfl: 1 .  fluticasone (FLONASE) 50 MCG/ACT nasal spray, Place 1 spray into the nose daily as needed for allergies. , Disp: , Rfl:  .  latanoprost (XALATAN) 0.005 % ophthalmic solution, Place 1 drop into both eyes at bedtime. , Disp: , Rfl:  .  Multiple Vitamin (MULTIVITAMIN WITH MINERALS) TABS tablet, Take 1 tablet by mouth daily., Disp: , Rfl:  .  vitamin C (ASCORBIC ACID) 500 MG tablet, Take 500 mg by mouth daily., Disp: , Rfl:  .  Vitamin E 400 UNITS CHEW, Chew 400 Units by mouth daily. , Disp: , Rfl:  .  brimonidine (ALPHAGAN) 0.2 % ophthalmic solution, Place 1 drop into both eyes 2 (two) times daily.,  Disp: , Rfl: 0 .  hydrALAZINE (APRESOLINE) 50 MG tablet, Take 1 tablet (50 mg total) by mouth 3 (three) times daily., Disp: 30 tablet, Rfl: 0  Allergies  Allergen Reactions  . Ace Inhibitors Cough     ROS  Constitutional: Negative for fever, positive for mild weight change.  Respiratory: Negative for cough and shortness of breath.   Cardiovascular: Negative for chest pain  or palpitations.  Gastrointestinal: Negative for abdominal pain, no bowel changes.  Musculoskeletal: Negative for gait problem or joint swelling.  Skin: Negative for rash.  Neurological: Negative for dizziness or headache.  No other specific complaints in a complete review of systems (except as listed in HPI above).  Objective  Vitals:   11/05/17 1338  BP: (!) 150/62  Pulse: (!) 57  Weight: 190 lb 8 oz (86.4 kg)  Height: 5\' 11"  (1.803 m)    Body mass index is 26.57 kg/m.  Physical Exam  Constitutional: Patient appears well-developed and well-nourished  No distress.  HEENT: head atraumatic, normocephalic, pupils equal and reactive to light, neck supple, throat within normal limits Cardiovascular: Normal rate, regular rhythm and normal heart sounds.  No murmur heard. Trace BLE  edema. Fistula on left arm  Pulmonary/Chest: Effort normal and breath sounds normal. No respiratory distress. Abdominal: Soft.  There is no tenderness. Psychiatric: Patient has a normal mood and affect. behavior is normal. Judgment and thought content normal.  PHQ2/9: Depression screen [facility] 2/9 11/26/2016 04/22/2016 01/03/2016 09/27/2015 06/13/2015  Decreased Interest 0 0 0 0 0  Down, Depressed, Hopeless 0 0 0 0 0  PHQ - 2 Score 0 0 0 0 0     Fall Risk: Fall Risk  07/07/2017 11/26/2016 04/22/2016 01/03/2016 09/27/2015  Falls in the past year? No No No No No      Functional Status Survey: Is the patient deaf or have difficulty hearing?: No Does the patient have difficulty seeing, even when wearing glasses/contacts?: No Does the patient have difficulty concentrating, remembering, or making decisions?: No Does the patient have difficulty walking or climbing stairs?: No Does the patient have difficulty dressing or bathing?: No Does the patient have difficulty doing errands alone such as visiting a doctor's office or shopping?: No    Assessment & Plan  1. Benign essential HTN  SBP is elevated, but it was okay on his last visit - Comprehensive metabolic panel - CBC with Differential/Platelet  2. Pulmonary hypertension ([place])  Seen by cardiologist  3. Dyslipidemia  At goal, reviewed labs from July   4. Chronic kidney disease, stage III (moderate) (HCC)  Recheck labs today  - VITAMIN D 25 Hydroxy (Vit-D Deficiency, Fractures) - CBC with Differential/Platelet - Parathyroid hormone, intact (no Ca) - Phosphorus  5. PVD (peripheral vascular disease) (H[facility])  Dr. Trula Slade  6. Anemia of chronic disease  No recent labs done ( per wife)   7. Secondary hyperparathyroidism of renal origin [facility])  He is seeing nephrologist now, under the care of Dr. Justin Mend  8. Dementia without behavioral disturbance, unspecified dementia type  - Ambulatory referral to Neurology  9. Unstable angina  pectoris ([place])  Doing well at this, no chest pain, sees Dr. Radford Pax  10. Prostate cancer [facility])  Continue follow up with Urologist   11. Claudication ([place])  Stable    12. Vitamin D deficiency  - VITAMIN D 25 Hydroxy (Vit-D Deficiency, Fractures)

## 2017-11-07 LAB — CBC WITH DIFFERENTIAL/PLATELET
BASOS PCT: 0.6 %
Basophils Absolute: 21 cells/uL (ref 0–200)
EOS PCT: 4 %
Eosinophils Absolute: 140 cells/uL (ref 15–500)
HCT: 31.8 % — ABNORMAL LOW (ref 38.5–50.0)
HEMOGLOBIN: 10.8 g/dL — AB (ref 13.2–17.1)
Lymphs Abs: 1421 cells/uL (ref 850–3900)
MCH: 31.4 pg (ref 27.0–33.0)
MCHC: 34 g/dL (ref 32.0–36.0)
MCV: 92.4 fL (ref 80.0–100.0)
MPV: 11.5 fL (ref 7.5–12.5)
Monocytes Relative: 11.4 %
NEUTROS ABS: 1519 {cells}/uL (ref 1500–7800)
Neutrophils Relative %: 43.4 %
PLATELETS: 147 10*3/uL (ref 140–400)
RBC: 3.44 10*6/uL — ABNORMAL LOW (ref 4.20–5.80)
RDW: 12.2 % (ref 11.0–15.0)
TOTAL LYMPHOCYTE: 40.6 %
WBC mixed population: 399 cells/uL (ref 200–950)
WBC: 3.5 10*3/uL — AB (ref 3.8–10.8)

## 2017-11-07 LAB — COMPREHENSIVE METABOLIC PANEL
AG RATIO: 1.2 (calc) (ref 1.0–2.5)
ALBUMIN MSPROF: 4.2 g/dL (ref 3.6–5.1)
ALKALINE PHOSPHATASE (APISO): 72 U/L (ref 40–115)
ALT: 17 U/L (ref 9–46)
AST: 16 U/L (ref 10–35)
BILIRUBIN TOTAL: 0.5 mg/dL (ref 0.2–1.2)
BUN/Creatinine Ratio: 15 (calc) (ref 6–22)
BUN: 30 mg/dL — AB (ref 7–25)
CALCIUM: 10.1 mg/dL (ref 8.6–10.3)
CO2: 24 mmol/L (ref 20–32)
Chloride: 108 mmol/L (ref 98–110)
Creat: 1.98 mg/dL — ABNORMAL HIGH (ref 0.70–1.11)
Globulin: 3.5 g/dL (calc) (ref 1.9–3.7)
Glucose, Bld: 91 mg/dL (ref 65–139)
POTASSIUM: 4.6 mmol/L (ref 3.5–5.3)
Sodium: 139 mmol/L (ref 135–146)
Total Protein: 7.7 g/dL (ref 6.1–8.1)

## 2017-11-07 LAB — TEST AUTHORIZATION

## 2017-11-07 LAB — PARATHYROID HORMONE, INTACT (NO CA): PTH: 183 pg/mL — AB (ref 14–64)

## 2017-11-07 LAB — CREATININE WITH EST GFR
CREATININE: 1.98 mg/dL — AB (ref 0.70–1.11)
GFR, EST AFRICAN AMERICAN: 35 mL/min/{1.73_m2} — AB (ref >=60)
GFR, Est Non African American: 30 mL/min/{1.73_m2} — ABNORMAL LOW (ref >=60)

## 2017-11-07 LAB — VITAMIN D 25 HYDROXY (VIT D DEFICIENCY, FRACTURES): Vit D, 25-Hydroxy: 16 ng/mL — ABNORMAL LOW (ref 30–100)

## 2017-11-07 LAB — PHOSPHORUS: Phosphorus: 3 mg/dL (ref 2.1–4.3)

## 2017-11-20 ENCOUNTER — Telehealth: Payer: Self-pay | Admitting: Family Medicine

## 2017-11-20 NOTE — Telephone Encounter (Signed)
Copied from [place]. Topic: General - Other >> Nov 20, 2017  4:58 PM Cecelia Byars, NT wrote: Reason for CRM: The patients wife called and said the insurance company said it would be less costly if the prescription for   amLODipine-olmesartan (AZOR) 10-40 MG tablet -olmesartan (AZOR) 10-40 MG tablet  was made into 2 prescriptions 1 for  amlodipine 10 mg and the other one for  olmesartan (AZOR) 40 MG tablet

## 2017-11-21 ENCOUNTER — Other Ambulatory Visit: Payer: Self-pay

## 2017-11-21 ENCOUNTER — Other Ambulatory Visit: Payer: Self-pay | Admitting: Family Medicine

## 2017-11-21 MED ORDER — AMLODIPINE BESYLATE 10 MG PO TABS: 10.0000 mg | ORAL_TABLET | Freq: Every day | ORAL | 1 refills | Status: DC

## 2017-11-21 MED ORDER — OLMESARTAN MEDOXOMIL 40 MG PO TABS: 40.0000 mg | ORAL_TABLET | Freq: Every day | ORAL | 1 refills | Status: DC

## 2017-11-21 NOTE — Telephone Encounter (Signed)
Refill request for Hypertension medication:  Amlodipine 10 mg  Olmesartan 40 mg   Last office visit pertaining to hypertension: 11/05/2017  BP Readings from Last 3 Encounters:  11/05/17 (!) 150/62  07/07/17 140/74  03/28/17 (!) 144/70    Lab Results  Component Value Date   CREATININE 1.98 (H) 11/05/2017   CREATININE 1.98 (H) 11/05/2017   BUN 30 (H) 11/05/2017   NA 139 11/05/2017   K 4.6 11/05/2017   CL 108 11/05/2017   CO2 24 11/05/2017   Follow-up on file. 03/04/2018

## 2017-11-21 NOTE — Telephone Encounter (Signed)
changed

## 2017-11-26 DIAGNOSIS — N183 Chronic kidney disease, stage 3 (moderate): Secondary | ICD-10-CM | POA: Diagnosis not present

## 2017-11-26 DIAGNOSIS — I129 Hypertensive chronic kidney disease with stage 1 through stage 4 chronic kidney disease, or unspecified chronic kidney disease: Secondary | ICD-10-CM | POA: Diagnosis not present

## 2017-11-26 DIAGNOSIS — D631 Anemia in chronic kidney disease: Secondary | ICD-10-CM | POA: Diagnosis not present

## 2017-11-26 DIAGNOSIS — N2581 Secondary hyperparathyroidism of renal origin: Secondary | ICD-10-CM | POA: Diagnosis not present

## 2017-11-27 ENCOUNTER — Telehealth: Payer: Self-pay | Admitting: Family Medicine

## 2017-11-27 NOTE — Telephone Encounter (Signed)
Informed patient of Dr. Ancil Boozer recommendation and she verbalized understanding and agreeance to split hydralazine in half. His wife will call back in a couple of days with BP reading with new dosage.

## 2017-11-27 NOTE — Telephone Encounter (Signed)
Yes, decrease the dose to 25 and recheck bp in the next few days

## 2017-11-27 NOTE — Telephone Encounter (Signed)
Copied from [place]. Topic: Quick Communication - See Telephone Encounter >> Nov 27, 2017 11:31 AM Conception Chancy, NT wrote: CRM for notification. See Telephone encounter for: 11/27/17.  Patient wife is calling and states she took her husband (pt) to the Kid[facility] Dr. Wilburn Mylar and his BP was 85/44 and an hour later 90/48. Kidney doctor is wanting to cut BP medicine hydrALAZINE (APRESOLINE) 50 MG tablet To 2 25mg  a day. She would like to know Dr. Ancil Boozer opinion. Please contact spouse.

## 2017-12-18 DIAGNOSIS — H35352 Cystoid macular degeneration, left eye: Secondary | ICD-10-CM | POA: Diagnosis not present

## 2017-12-18 DIAGNOSIS — H401123 Primary open-angle glaucoma, left eye, severe stage: Secondary | ICD-10-CM | POA: Diagnosis not present

## 2017-12-22 DIAGNOSIS — I129 Hypertensive chronic kidney disease with stage 1 through stage 4 chronic kidney disease, or unspecified chronic kidney disease: Secondary | ICD-10-CM | POA: Diagnosis not present

## 2017-12-22 DIAGNOSIS — N183 Chronic kidney disease, stage 3 (moderate): Secondary | ICD-10-CM | POA: Diagnosis not present

## 2017-12-22 DIAGNOSIS — D631 Anemia in chronic kidney disease: Secondary | ICD-10-CM | POA: Diagnosis not present

## 2017-12-22 DIAGNOSIS — N2581 Secondary hyperparathyroidism of renal origin: Secondary | ICD-10-CM | POA: Diagnosis not present

## 2017-12-25 DIAGNOSIS — H35352 Cystoid macular degeneration, left eye: Secondary | ICD-10-CM | POA: Diagnosis not present

## 2018-01-01 ENCOUNTER — Ambulatory Visit: Payer: Medicare HMO | Admitting: Neurology

## 2018-01-01 ENCOUNTER — Encounter: Payer: Self-pay | Admitting: Neurology

## 2018-01-01 ENCOUNTER — Telehealth: Payer: Self-pay | Admitting: Neurology

## 2018-01-01 VITALS — BP 107/44 | HR 60 | Ht 71.0 in | Wt 187.5 lb

## 2018-01-01 DIAGNOSIS — E538 Deficiency of other specified B group vitamins: Secondary | ICD-10-CM | POA: Diagnosis not present

## 2018-01-01 DIAGNOSIS — R413 Other amnesia: Secondary | ICD-10-CM | POA: Diagnosis not present

## 2018-01-01 NOTE — Patient Instructions (Signed)
   We will check blood work today and get a CT of the brain.

## 2018-01-01 NOTE — Progress Notes (Signed)
Reason for visit: Memory disturbance  Referring physician: Dr. Orlean Bradford P [name]. is a 84 y.o. male  History of present illness:  [name] is an 84 year old right-handed black male with a history of chronic renal insufficiency.  The patient has had significant problems with memory over the last 18 months, but the wife indicates that the patient has had some memory problems since he was around 84 years old.  The patient is still working part-time.  He still drives a motor vehicle, and at times he has mild problems with directions but he has not had any safety issues.  He has always let his wife manage his medications and appointments, he has never done this on his own.  His wife does the finances.  The patient reports some troubles with word finding at times and some short-term memory issues.  He denies any problems with sleeping or trouble with fatigue during the day.  The patient has not had any headaches or dizziness.  He denies any difficulty controlling the bowels or the bladder.  He denies any significant gait instability problems.  He indicates his mother and one sister have a history of memory problems as well.  The patient has been placed on Aricept, he tolerates this medication well, he has not noted a boost in memory on the medication.  He is sent to this office for further evaluation.  Past Medical History:  Diagnosis Date  . Anemia   . Arthritis   . Bradycardia   . Chronic kidney disease   . GERD (gastroesophageal reflux disease)   . High cholesterol   . Hypertension   . IBS (irritable bowel syndrome)   . Pneumonia   . Prostate cancer ([place])   . Pulmonary HTN ([place]) 11/05/2015   resolved on echo 11/2015  . PVD (peripheral vascular disease) ([place])     Past Surgical History:  Procedure Laterality Date  . BASCILIC VEIN TRANSPOSITION Left 09/05/2016   Procedure: FIRST STAGE BASILIC VEIN TRANSPOSITION left arm;  Surgeon: Serafina Mitchell, MD;  Location: [place];   Service: Vascular;  Laterality: Left;  . BASCILIC VEIN TRANSPOSITION Left 11/14/2016   Procedure: BASCILIC VEIN TRANSPOSITION-LEFT ARM 2ND STAGE;  Surgeon: Serafina Mitchell, MD;  Location: [place];  Service: Vascular;  Laterality: Left;  . COLONOSCOPY    . PROSTATE SURGERY    . PROSTATECTOMY      Family History  Problem Relation Age of Onset  . CVA Mother   . Hypertension Mother   . Dementia Mother   . Heart attack Father   . Heart disease Father        before age 79  . Hypertension Father   . Hepatitis C Sister   . Dementia Sister     Social history:  reports that he quit smoking about 39 years ago. His smoking use included cigarettes. He has a 6.00 pack-year smoking history. He has never used smokeless tobacco. He reports that he does not drink alcohol or use drugs.  Medications:  Prior to Admission medications   Medication Sig Start Date End Date Taking? Authorizing Provider  amLODipine (NORVASC) 10 MG tablet Take 1 tablet (10 mg total) by mouth daily. 11/21/17  Yes Steele Sizer, MD  aspirin 81 MG tablet Take 81 mg by mouth daily.  10/05/09  Yes [provider]  atorvastatin (LIPITOR) 40 MG tablet Take 1 tablet (40 mg total) by mouth every evening. 11/05/17  Yes Steele Sizer, MD  brimonidine (ALPHAGAN) 0.2 % ophthalmic solution Place 1 drop into both eyes 2 (two) times daily. 10/24/17  Yes [provider]  cetirizine (ZYRTEC) 10 MG tablet Take 10 mg by mouth daily as needed for allergies.   Yes [provider]  Difluprednate (DUREZOL OP) Place 1 drop into the left eye 3 (three) times daily.   Yes [provider]  donepezil (ARICEPT) 10 MG tablet TAKE 1 TABLET AT BEDTIME 08/28/17  Yes Sowles, Drue Stager, MD  dorzolamide-timolol (COSOPT) 22.3-6.8 MG/ML ophthalmic solution  03/13/17  Yes [provider]  fluticasone (FLONASE) 50 MCG/ACT nasal spray Place 1 spray into the nose daily as needed for allergies.  10/31/14  Yes [provider]    ketorolac (ACULAR) 0.5 % ophthalmic solution INT 1 GTT INTO OD QID 12/19/17  Yes [provider]  latanoprost (XALATAN) 0.005 % ophthalmic solution Place 1 drop into both eyes at bedtime.  12/02/15  Yes [provider]  Multiple Vitamin (MULTIVITAMIN WITH MINERALS) TABS tablet Take 1 tablet by mouth daily.   Yes [provider]  olmesartan (BENICAR) 40 MG tablet Take 1 tablet (40 mg total) by mouth daily. 11/21/17  Yes Sowles, Drue Stager, MD  vitamin C (ASCORBIC ACID) 500 MG tablet Take 500 mg by mouth daily.   Yes [provider]  Vitamin E 400 UNITS CHEW Chew 400 Units by mouth daily.    Yes [provider]  hydrALAZINE (APRESOLINE) 50 MG tablet Take 1 tablet (50 mg total) by mouth 3 (three) times daily. Patient taking differently: Take 50 mg by mouth 2 (two) times daily.  06/25/17 07/05/17  Steele Sizer, MD      Allergies  Allergen Reactions  . Ace Inhibitors Cough    ROS:  Out of a complete 14 system review of symptoms, the patient complains only of the following symptoms, and all other reviewed systems are negative.  Snoring Anemia Feeling cold Cramps Memory loss, confusion Decreased energy  Blood pressure (!) 107/44, pulse 60, height 5\' 11"  (1.803 m), weight 187 lb 8 oz (85 kg).  Physical Exam  General: The patient is alert and cooperative at the time of the examination.  Eyes: Pupils are equal, round, and reactive to light. Discs are flat bilaterally.  Neck: The neck is supple, no carotid bruits are noted.  Respiratory: The respiratory examination is clear.  Cardiovascular: The cardiovascular examination reveals a regular rate and rhythm, no obvious murmurs or rubs are noted.  Skin: Extremities are without significant edema.  Neurologic Exam  Mental status: The patient is alert and oriented x 2 at the time of the examination (not oriented to date). The Mini-Mental status examination done today shows a total score  17/30.  Cranial nerves: Facial symmetry is present. There is good sensation of the face to pinprick and soft touch bilaterally. The strength of the facial muscles and the muscles to head turning and shoulder shrug are normal bilaterally. Speech is well enunciated, no aphasia or dysarthria is noted. Extraocular movements are full. Visual fields are full. The tongue is midline, and the patient has symmetric elevation of the soft palate. No obvious hearing deficits are noted.  Motor: The motor testing reveals 5 over 5 strength of all 4 extremities. Good symmetric motor tone is noted throughout.  Sensory: Sensory testing is intact to pinprick, soft touch, vibration sensation, and position sense on all 4 extremities. No evidence of extinction is noted.  Coordination: Cerebellar testing reveals good finger-nose-finger and heel-to-shin bilaterally.  Gait and station:  Gait is normal. Tandem gait is slightly unsteady. Romberg is negative. No drift is seen.  Reflexes: Deep tendon reflexes are symmetric and normal bilaterally. Toes are downgoing bilaterally.   Assessment/Plan:  1.  Progressive memory disturbance  The patient is on Aricept, he will continue this medication.  We will check further blood work today and get CT scan evaluation of the brain.  He will follow-up in about 6 months.  Jill Alexanders MD 01/01/2018 2:47 PM  [facility] [address] [place], [place] [address]  Phone [phone] Fax [phone]

## 2018-01-01 NOTE — Telephone Encounter (Signed)
[employer]are Patrick [name]: [id number] (exp. 01/01/18 to 01/31/18) order sent to GI. They will erach out to the pt to schedule.

## 2018-01-02 ENCOUNTER — Telehealth: Payer: Self-pay | Admitting: *Deleted

## 2018-01-02 LAB — RPR: RPR: NONREACTIVE

## 2018-01-02 LAB — SEDIMENTATION RATE: Sed Rate: 11 mm/hr (ref 0–30)

## 2018-01-02 LAB — VITAMIN B12: VITAMIN B 12: 854 pg/mL (ref 232–1245)

## 2018-01-02 NOTE — Telephone Encounter (Signed)
Called and spoke w/ wife about unremarkable labs per Dr. Rexene Alberts.   Advised order for CT head sent to [facility]. They can call [phone] to schedule if they would like. She verbalized understanding.

## 2018-01-02 NOTE — Progress Notes (Signed)
Blood work is unremarkable, please notify patient or caretaker on [place].  Michel Bickers

## 2018-01-02 NOTE — Telephone Encounter (Signed)
-----   Message from Star Age, MD sent at 01/02/2018  9:11 AM EDT ----- Blood work is unremarkable, please notify patient or caretaker on D[facility].  Michel Bickers

## 2018-01-05 ENCOUNTER — Ambulatory Visit: Payer: Medicare HMO | Admitting: Cardiology

## 2018-01-15 DIAGNOSIS — H35352 Cystoid macular degeneration, left eye: Secondary | ICD-10-CM | POA: Diagnosis not present

## 2018-01-16 ENCOUNTER — Ambulatory Visit
Admission: RE | Admit: 2018-01-16 | Discharge: 2018-01-16 | Disposition: A | Discharge status: Home / Self Care | Payer: Medicare HMO | Source: Ambulatory Visit | Attending: Neurology | Admitting: Neurology

## 2018-01-16 DIAGNOSIS — R413 Other amnesia: Secondary | ICD-10-CM

## 2018-01-19 ENCOUNTER — Telehealth: Payer: Self-pay | Admitting: Neurology

## 2018-01-19 NOTE — Telephone Encounter (Signed)
I called concerning the CT of the brain.  No significant small vessel disease is noted, there is actually very little generalized atrophy.  The patient will continue on the Aricept.   CT head 01/19/18:  IMPRESSION: This CT scan of the head without contrast shows the following: 1.   There is mild generalized cortical atrophy.   The extent is typical for age. 2.   Mild chronic microvascular ischemic changes, typical for age. 3.   There are no acute findings.

## 2018-02-23 DIAGNOSIS — C61 Malignant neoplasm of prostate: Secondary | ICD-10-CM | POA: Diagnosis not present

## 2018-02-23 DIAGNOSIS — R972 Elevated prostate specific antigen [PSA]: Secondary | ICD-10-CM | POA: Diagnosis not present

## 2018-02-23 DIAGNOSIS — N183 Chronic kidney disease, stage 3 (moderate): Secondary | ICD-10-CM | POA: Diagnosis not present

## 2018-02-23 DIAGNOSIS — Z9079 Acquired absence of other genital organ(s): Secondary | ICD-10-CM | POA: Diagnosis not present

## 2018-02-23 DIAGNOSIS — D631 Anemia in chronic kidney disease: Secondary | ICD-10-CM | POA: Diagnosis not present

## 2018-02-23 DIAGNOSIS — N2581 Secondary hyperparathyroidism of renal origin: Secondary | ICD-10-CM | POA: Diagnosis not present

## 2018-03-02 ENCOUNTER — Encounter: Payer: Self-pay | Admitting: Cardiology

## 2018-03-02 ENCOUNTER — Ambulatory Visit: Payer: Medicare HMO | Admitting: Cardiology

## 2018-03-02 VITALS — BP 150/60 | HR 61 | Ht 71.0 in | Wt 187.8 lb

## 2018-03-02 DIAGNOSIS — N184 Chronic kidney disease, stage 4 (severe): Secondary | ICD-10-CM

## 2018-03-02 DIAGNOSIS — I10 Essential (primary) hypertension: Secondary | ICD-10-CM | POA: Diagnosis not present

## 2018-03-02 DIAGNOSIS — I272 Pulmonary hypertension, unspecified: Secondary | ICD-10-CM

## 2018-03-02 MED ORDER — HYDRALAZINE HCL 10 MG PO TABS: 10.0000 mg | ORAL_TABLET | Freq: Two times a day (BID) | ORAL | 0 refills | Status: DC

## 2018-03-02 MED ORDER — HYDRALAZINE HCL 10 MG PO TABS: 10.0000 mg | ORAL_TABLET | Freq: Two times a day (BID) | ORAL | 3 refills | Status: DC

## 2018-03-02 NOTE — Patient Instructions (Signed)
Medication Instructions:  Your physician has recommended you make the following change in your medication:  INCREASE: Hydralazine to 10 mg two times a day   If you need a refill on your cardiac medications, please contact your pharmacy first.  Labwork: None ordered   Testing/Procedures: None ordered   Follow-Up: Your physician wants you to follow-up in: 1 year with Dr. Radford Pax. You will receive a reminder letter in the mail two months in advance. If you don't receive a letter, please call our office to schedule the follow-up appointment.  Any Other Special Instructions Will Be Listed Below (If Applicable). Your physician would like you to check your blood pressure daily for the next week and call office with your blood pressure readings.   Thank you for choosing [facility], RN  [phone]  If you need a refill on your cardiac medications before your next appointment, please call your pharmacy.

## 2018-03-02 NOTE — Progress Notes (Signed)
Cardiology Office Note:    Date:  03/02/2018   ID:  Patrick Bellows., DOB [birth year], MRN [id number]  PCP:  Steele Sizer, MD  Cardiologist:  No primary care provider on file.    Referring MD: Steele Sizer, MD   Chief Complaint  Patient presents with  . Follow-up    Pulmonary HTN, HTN    History of Present Illness:    Patrick Barnaby. is a 84 y.o. male with a hx of HTN, PVD, CKD stage III status post AV fistula placement followed by nephrology but not started HD yet.  He also has a history of moderate pulmonary HTN (PASP 35mmHg on echo 2015) and dyslipidemia.  He has a chronically abnormal EKG with a normal Myoview in 2015.  He saw Truitt Merle, NP 2 years ago for intermittent chest pain with radiation to the left arm and shortness of breath.  Stress Myoview was ordered which showed no inducible ischemia.  He has not been seen by cardiology for 2 years.  He has chronic lower extremity edema which is controlled with compression hose.  He has been having progressive dementia symptoms placed on Aricept by neurology.  Chest CT showed no acute findings except mild generalized cortical atrophy normal for his age with mild chronic microvascular ischemic changes.  He is here today for followup and is doing well.  He denies any chest pain or pressure, SOB, DOE, PND, orthopnea, LE edema, dizziness, palpitations or syncope. He is compliant with his meds and is tolerating meds with no SE.    Past Medical History:  Diagnosis Date  . Anemia   . Arthritis   . Bradycardia   . Chronic kidney disease   . GERD (gastroesophageal reflux disease)   . High cholesterol   . Hypertension   . IBS (irritable bowel syndrome)   . Pneumonia   . Prostate cancer ([place])   . Pulmonary HTN ([place]) 11/05/2015   22mmHg on echo 2017  . PVD (peripheral vascular disease) ([place])     Past Surgical History:  Procedure Laterality Date  . BASCILIC VEIN TRANSPOSITION Left 09/05/2016   Procedure: FIRST STAGE  BASILIC VEIN TRANSPOSITION left arm;  Surgeon: Serafina Mitchell, MD;  Location: [place] [facility];  Service: Vascular;  Laterality: Left;  . BASCILIC VEIN TRANSPOSITION Left 11/14/2016   Procedure: BASCILIC VEIN TRANSPOSITION-LEFT ARM 2ND STAGE;  Surgeon: Serafina Mitchell, MD;  Location: [facility];  Service: Vascular;  Laterality: Left;  . COLONOSCOPY    . PROSTATE SURGERY    . PROSTATECTOMY      Current Medications: Current Meds  Medication Sig  . amLODipine (NORVASC) 10 MG tablet Take 1 tablet (10 mg total) by mouth daily.  [name] Kitchen aspirin 81 MG tablet Take 81 mg by mouth daily.   [name] Kitchen atorvastatin (LIPITOR) 40 MG tablet Take 1 tablet (40 mg total) by mouth every evening.  . brimonidine (ALPHAGAN) 0.2 % ophthalmic solution Place 1 drop into both eyes 2 (two) times daily.  . cetirizine (ZYRTEC) 10 MG tablet Take 10 mg by mouth daily as needed for allergies.  . Difluprednate (DUREZOL OP) Place 1 drop into the left eye 3 (three) times daily.  [name] Kitchen donepezil (ARICEPT) 10 MG tablet TAKE 1 TABLET AT BEDTIME  . dorzolamide-timolol (COSOPT) 22.3-6.8 MG/ML ophthalmic solution   . IRON PO Take 1 tablet by mouth daily.  [name] Kitchen ketorolac (ACULAR) 0.5 % ophthalmic solution INT 1 GTT INTO OD QID  . latanoprost (XALATAN) 0.005 % ophthalmic solution Place 1  drop into both eyes at bedtime.   . Multiple Vitamin (MULTIVITAMIN WITH MINERALS) TABS tablet Take 1 tablet by mouth daily.  [name] Kitchen olmesartan (BENICAR) 40 MG tablet Take 1 tablet (40 mg total) by mouth daily.  . vitamin C (ASCORBIC ACID) 500 MG tablet Take 500 mg by mouth daily.  . Vitamin E 400 UNITS CHEW Chew 400 Units by mouth daily.   . [DISCONTINUED] hydrALAZINE (APRESOLINE) 25 MG tablet Take 25 mg by mouth daily.      Allergies:   Ace inhibitors   Social History   Socioeconomic History  . Marital status: Married    Spouse name: Not on file  . Number of children: Not on file  . Years of education: Not on file  . Highest education level: Not on file  Occupational History  .  Not on file  Social Needs  . Financial resource strain: Not on file  . Food insecurity:    Worry: Not on file    Inability: Not on file  . Transportation needs:    Medical: Not on file    Non-medical: Not on file  Tobacco Use  . Smoking status: Former Smoker    Packs/day: 0.30    Years: 20.00    Pack years: 6.00    Types: Cigarettes    Last attempt to quit: 08/26/1978    Years since quitting: 39.5  . Smokeless tobacco: Never Used  Substance and Sexual Activity  . Alcohol use: No    Alcohol/week: 0.0 oz  . Drug use: No  . Sexual activity: Not Currently  Lifestyle  . Physical activity:    Days per week: Not on file    Minutes per session: Not on file  . Stress: Not on file  Relationships  . Social connections:    Talks on phone: Not on file    Gets together: Not on file    Attends religious service: Not on file    Active member of club or organization: Not on file    Attends meetings of clubs or organizations: Not on file    Relationship status: Not on file  Other Topics Concern  . Not on file  Social History Narrative   Lives w/ wife   Caffeine use: none   Right handed      Family History: The patient's family history includes CVA in his mother; Dementia in his mother and sister; Heart attack in his father; Heart disease in his father; Hepatitis C in his sister; Hypertension in his father and mother.  ROS:   Please see the history of present illness.    ROS  All other systems reviewed and negative.   EKGs/Labs/Other Studies Reviewed:    The following studies were reviewed today: none  EKG:  EKG is  ordered today.  The ekg ordered today demonstrates normal sinus rhythm with sinus arrhythmia at 61 bpm with nonspecific ST abnormality.  Recent Labs: 11/05/2017: ALT 17; BUN 30; Creat 1.98; Creat 1.98; Hemoglobin 10.8; Platelets 147; Potassium 4.6; Sodium 139   Recent Lipid Panel    Component Value Date/Time   CHOL 129 03/28/2017 0945   CHOL 233 (H)  09/27/2015 0958   TRIG 67 03/28/2017 0945   HDL 64 03/28/2017 0945   HDL 78 09/27/2015 0958   CHOLHDL 2.0 03/28/2017 0945   VLDL 13 03/28/2017 0945   LDLCALC 52 03/28/2017 0945   LDLCALC 131 (H) 09/27/2015 0958    Physical Exam:    VS:  BP (!) 150/60  Pulse 61   Ht 5\' 11"  (1.803 m)   Wt 187 lb 12.8 oz (85.2 kg)   BMI 26.19 kg/m     Wt Readings from Last 3 Encounters:  03/02/18 187 lb 12.8 oz (85.2 kg)  01/01/18 187 lb 8 oz (85 kg)  11/05/17 190 lb 8 oz (86.4 kg)     GEN:  Well nourished, well developed in no acute distress HEENT: Normal NECK: No JVD; No carotid bruits LYMPHATICS: No lymphadenopathy CARDIAC: RRR, no murmurs, rubs, gallops RESPIRATORY:  Clear to auscultation without rales, wheezing or rhonchi  ABDOMEN: Soft, non-tender, non-distended MUSCULOSKELETAL:  No edema; No deformity  SKIN: Warm and dry NEUROLOGIC:  Alert and oriented x 3 PSYCHIATRIC:  Normal affect   ASSESSMENT:    1. Pulmonary hypertension ([place])   2. Hypertension, unspecified type   3. Chronic kidney disease, stage IV (severe) (HCC)    PLAN:    In order of problems listed above:  1.  Pulmonary hypertension -echo done 11/13/2015 showed mild pulmonary hypertension with PASP 37 mmHg. LV function was normal.  2.  Hypertension -he is borderline controlled on exam today.  He will continue on amlodipine 10 mg daily.  He has only been taking hydralazine 50 mg 1/2 tablet once a day because his blood pressures been running low.  I suspect he is not getting adequate 24-hour control of his blood pressure because it is mildly elevated today.  I recommend we change him to hydralazine 10 mg twice daily.  He will check his blood pressure once daily for a week and call me with the results.  He is not on ACE or ARB due to chronic kidney disease.  3.  CKD stage IV -he is status post AVF placement but has not started HD yet   Medication Adjustments/Labs and Tests Ordered: Current medicines are reviewed at  length with the patient today.  Concerns regarding medicines are outlined above.  Orders Placed This Encounter  Procedures  . EKG 12-Lead   Meds ordered this encounter  Medications  . hydrALAZINE (APRESOLINE) 10 MG tablet    Sig: Take 1 tablet (10 mg total) by mouth 2 (two) times daily.    Dispense:  180 tablet    Refill:  3    Signed, Fransico Him, MD  03/02/2018 2:41 PM    [place]

## 2018-03-04 ENCOUNTER — Encounter: Payer: Self-pay | Admitting: Family Medicine

## 2018-03-04 ENCOUNTER — Ambulatory Visit (INDEPENDENT_AMBULATORY_CARE_PROVIDER_SITE_OTHER): Payer: Medicare HMO | Admitting: Family Medicine

## 2018-03-04 VITALS — BP 150/68 | HR 54 | Temp 98.0°F | Resp 16 | Ht 71.0 in | Wt 184.9 lb

## 2018-03-04 DIAGNOSIS — I739 Peripheral vascular disease, unspecified: Secondary | ICD-10-CM

## 2018-03-04 DIAGNOSIS — D638 Anemia in other chronic diseases classified elsewhere: Secondary | ICD-10-CM | POA: Diagnosis not present

## 2018-03-04 DIAGNOSIS — N2581 Secondary hyperparathyroidism of renal origin: Secondary | ICD-10-CM

## 2018-03-04 DIAGNOSIS — I10 Essential (primary) hypertension: Secondary | ICD-10-CM

## 2018-03-04 DIAGNOSIS — I272 Pulmonary hypertension, unspecified: Secondary | ICD-10-CM | POA: Diagnosis not present

## 2018-03-04 DIAGNOSIS — E559 Vitamin D deficiency, unspecified: Secondary | ICD-10-CM

## 2018-03-04 DIAGNOSIS — C61 Malignant neoplasm of prostate: Secondary | ICD-10-CM

## 2018-03-04 DIAGNOSIS — N183 Chronic kidney disease, stage 3 unspecified: Secondary | ICD-10-CM

## 2018-03-04 DIAGNOSIS — E785 Hyperlipidemia, unspecified: Secondary | ICD-10-CM | POA: Diagnosis not present

## 2018-03-04 DIAGNOSIS — F0390 Unspecified dementia without behavioral disturbance: Secondary | ICD-10-CM | POA: Diagnosis not present

## 2018-03-04 MED ORDER — VITAMIN D 50 MCG (2000 UT) PO CAPS
1.0000 | ORAL_CAPSULE | Freq: Every day | ORAL | 0 refills | Status: DC
Start: 2018-03-04 — End: 2018-07-27

## 2018-03-04 MED ORDER — DONEPEZIL HCL 10 MG PO TABS: 10.0000 mg | ORAL_TABLET | Freq: Every day | ORAL | 1 refills | Status: DC

## 2018-03-04 NOTE — Progress Notes (Signed)
Name: Patrick [name].   MRN: [id number]    DOB: Jan 15, 1932   Date:03/04/2018       Progress Note  Subjective  Chief Complaint  Chief Complaint  Patient presents with  . Medication Refill    4 month F/U  . Hypertension  . Dementia    HPI  HTN: Hewas onValsartan, Norvasc and Hydralazine, recently seen by Dr. Radford Pax and hydralazine changed to BID, bp today still elevated, advised to monitor after lunch and may have to go up to TID dosing, they will contact us back. Denies chest pain or palpitation at this time.   Angina:no recent episodes of chest pain, no SOB, he denies orthopnea.Unchanged.   Bone Pain: he has multiple bone scans and negative for metastasis.He states he is doing much, no longer having pain.Symptoms were severe 2017, since he has been off Lupron. No longer has bone pain   Pulmonary hypertension: discussed Revatio therapy, but wife and patient are worried about possible side effects, wife forgot to discuss it with Dr. Radford Pax.   CKI stage III continue follow up with nephrologist. - [facility] , fistula placed on left upper arm back in 11/2016 and is doing well, has not started HD yet, his kidney went from stage IV to stage III, just being monitored at this time. Secondary hyperparathyroidism worse on last labs and having some cramps on legs and hands.   Prostate Cancer: currently seeing Urologist at [facility], Dr. Rosana Hoes., unchnaged  Dyslipidemia: still taking Atorvastatin , compliant with medication   Dementia: wife states symptoms are stable, slightly forgetful over the past year, gradual process. He misplaced a few items, he is taking his own medications now. When driving with wife he has missed a couple of turns when driving. At work he is also getting a little forgetful . MMS very low when checked04/2017. On Aricept 10 mg Fall 2018, seen by Neurologist since and normal RPR, B12 and CRP recently and advised to stay on medication    PVD:  swelling resolved, has some cramping, but not associated with activity    Patient Active Problem List   Diagnosis Date Noted  . Secondary hyperparathyroidism ([place]) 11/26/2016  . Mild cognitive impairment 01/03/2016  . Elevated prostate specific antigen (PSA) 09/08/2015  . Bone pain 06/13/2015  . Spinal stenosis 06/13/2015  . OP (osteoporosis) 03/20/2015  . Allergic rhinitis 03/20/2015  . Lumbar canal stenosis 03/20/2015  . Vitamin D deficiency 03/20/2015  . Restless legs syndrome 03/20/2015  . Drug-induced gynecomastia 03/20/2015  . Diaphragmatic hernia 03/20/2015  . Acquired cyst of kidney 03/20/2015  . Left ventricular hypertrophy by electrocardiogram 03/20/2015  . Leg pain 03/20/2015  . Chronic kidney disease, stage IV (severe) ([place]) 03/20/2015  . History of pneumonia 03/20/2015  . Non-rheumatic tricuspid valve insufficiency 03/20/2015  . Dysmetabolic syndrome 93/73/4287  . Pulmonary hypertension ([place]) 10/31/2014  . Benign essential HTN 07/29/2014  . Dyslipidemia 07/29/2014  . PVD (peripheral vascular disease) ([place])   . Anemia of chronic disease   . IBS (irritable bowel syndrome)   . Bradycardia   . ED (erectile dysfunction) of organic origin 06/29/2012  . Genuine stress incontinence, male 06/29/2012  . Malignant neoplasm of prostate ([place]) 06/29/2012    Past Surgical History:  Procedure Laterality Date  . BASCILIC VEIN TRANSPOSITION Left 09/05/2016   Procedure: FIRST STAGE BASILIC VEIN TRANSPOSITION left arm;  Surgeon: Serafina Mitchell, MD;  Location: [facility];  Service: Vascular;  Laterality: Left;  . BASCILIC VEIN TRANSPOSITION Left 11/14/2016  Procedure: BASCILIC VEIN TRANSPOSITION-LEFT ARM 2ND STAGE;  Surgeon: Serafina Mitchell, MD;  Location: [facility];  Service: Vascular;  Laterality: Left;  . COLONOSCOPY    . PROSTATE SURGERY    . PROSTATECTOMY      Family History  Problem Relation Age of Onset  . CVA Mother   . Hypertension Mother   . Dementia Mother   . Heart  attack Father   . Heart disease Father        before age 7  . Hypertension Father   . Hepatitis C Sister   . Dementia Sister     Social History   Socioeconomic History  . Marital status: Married    Spouse name: Not on file  . Number of children: Not on file  . Years of education: Not on file  . Highest education level: Not on file  Occupational History  . Not on file  Social Needs  . Financial resource strain: Not on file  . Food insecurity:    Worry: Not on file    Inability: Not on file  . Transportation needs:    Medical: Not on file    Non-medical: Not on file  Tobacco Use  . Smoking status: Former Smoker    Packs/day: 0.30    Years: 20.00    Pack years: 6.00    Types: Cigarettes    Last attempt to quit: 08/26/1978    Years since quitting: 39.5  . Smokeless tobacco: Never Used  Substance and Sexual Activity  . Alcohol use: No    Alcohol/week: 0.0 oz  . Drug use: No  . Sexual activity: Not Currently  Lifestyle  . Physical activity:    Days per week: Not on file    Minutes per session: Not on file  . Stress: Not on file  Relationships  . Social connections:    Talks on phone: Not on file    Gets together: Not on file    Attends religious service: Not on file    Active member of club or organization: Not on file    Attends meetings of clubs or organizations: Not on file    Relationship status: Not on file  . Intimate partner violence:    Fear of current or ex partner: Not on file    Emotionally abused: Not on file    Physically abused: Not on file    Forced sexual activity: Not on file  Other Topics Concern  . Not on file  Social History Narrative   Lives w/ wife   Caffeine use: none   Right handed      Current Outpatient Medications:  .  amLODipine (NORVASC) 10 MG tablet, Take 1 tablet (10 mg total) by mouth daily., Disp: 90 tablet, Rfl: 1 .  aspirin 81 MG tablet, Take 81 mg by mouth daily. , Disp: , Rfl:  .  atorvastatin (LIPITOR) 40 MG  tablet, Take 1 tablet (40 mg total) by mouth every evening., Disp: 90 tablet, Rfl: 3 .  brimonidine (ALPHAGAN) 0.2 % ophthalmic solution, Place 1 drop into both eyes 2 (two) times daily., Disp: , Rfl: 0 .  cetirizine (ZYRTEC) 10 MG tablet, Take 10 mg by mouth daily as needed for allergies., Disp: , Rfl:  .  Difluprednate (DUREZOL OP), Place 1 drop into the left eye 3 (three) times daily., Disp: , Rfl:  .  donepezil (ARICEPT) 10 MG tablet, TAKE 1 TABLET AT BEDTIME, Disp: 90 tablet, Rfl: 1 .  dorzolamide-timolol (COSOPT) 22.3-6.8  MG/ML ophthalmic solution, , Disp: , Rfl: 1 .  hydrALAZINE (APRESOLINE) 10 MG tablet, Take 1 tablet (10 mg total) by mouth 2 (two) times daily., Disp: 60 tablet, Rfl: 0 .  IRON PO, Take 1 tablet by mouth daily., Disp: , Rfl:  .  ketorolac (ACULAR) 0.5 % ophthalmic solution, INT 1 GTT INTO OD QID, Disp: , Rfl: 0 .  latanoprost (XALATAN) 0.005 % ophthalmic solution, Place 1 drop into both eyes at bedtime. , Disp: , Rfl:  .  Multiple Vitamin (MULTIVITAMIN WITH MINERALS) TABS tablet, Take 1 tablet by mouth daily., Disp: , Rfl:  .  olmesartan (BENICAR) 40 MG tablet, Take 1 tablet (40 mg total) by mouth daily., Disp: 90 tablet, Rfl: 1 .  vitamin C (ASCORBIC ACID) 500 MG tablet, Take 500 mg by mouth daily., Disp: , Rfl:  .  Vitamin E 400 UNITS CHEW, Chew 400 Units by mouth daily. , Disp: , Rfl:   Allergies  Allergen Reactions  . Ace Inhibitors Cough    cough     ROS  Constitutional: Negative for fever, positive for weight change.  Respiratory: Negative for cough and shortness of breath.   Cardiovascular: Negative for chest pain or palpitations.  Gastrointestinal: Negative for abdominal pain, no bowel changes.  Musculoskeletal: Negative for gait problem or joint swelling.  Skin: Negative for rash.  Neurological: Negative for dizziness or headache.  No other specific complaints in a complete review of systems (except as listed in HPI above).  Objective  Vitals:    03/04/18 1400 03/04/18 1419  BP: (!) 150/90 (!) 150/68  Pulse: (!) 54   Resp: 16   Temp: 98 F (36.7 C)   TempSrc: Oral   SpO2: 98%   Weight: 184 lb 14.4 oz (83.9 kg)   Height: 5\' 11"  (1.803 m)     Body mass index is 25.79 kg/m.  Physical Exam  Constitutional: Patient appears well-developed and well-nourished.  No distress.  HEENT: head atraumatic, normocephalic, pupils equal and reactive to light, eneck supple, throat within normal limits Cardiovascular: Normal rate, regular rhythm and normal heart sounds.  No murmur heard. No BLE edema. Pulmonary/Chest: Effort normal and breath sounds normal. No respiratory distress. Abdominal: Soft.  There is no tenderness. Psychiatric: Patient has a normal mood and affect. behavior is normal. Judgment and thought content normal.  Recent Results (from the past 2160 hour(s))  RPR     Status: None   Collection Time: 01/01/18  2:57 PM  Result Value Ref Range   RPR Ser Ql Non Reactive Non Reactive  Vitamin B12     Status: None   Collection Time: 01/01/18  2:57 PM  Result Value Ref Range   Vitamin B-12 854 232 - 1,245 pg/mL  Sedimentation rate     Status: None   Collection Time: 01/01/18  2:57 PM  Result Value Ref Range   Sed Rate 11 0 - 30 mm/hr     PHQ2/9: Depression screen [facility] 2/9 03/04/2018 11/26/2016 04/22/2016 01/03/2016 09/27/2015  Decreased Interest 0 0 0 0 0  Down, Depressed, Hopeless 0 0 0 0 0  PHQ - 2 Score 0 0 0 0 0     Fall Risk: Fall Risk  03/04/2018 07/07/2017 11/26/2016 04/22/2016 01/03/2016  Falls in the past year? No No No No No     Functional Status Survey: Is the patient deaf or have difficulty hearing?: No Does the patient have difficulty seeing, even when wearing glasses/contacts?: No Does the patient have difficulty concentrating, remembering,  or making decisions?: Yes Does the patient have difficulty walking or climbing stairs?: No Does the patient have difficulty dressing or bathing?: No Does the patient have  difficulty doing errands alone such as visiting a doctor's office or shopping?: No    Assessment & Plan  1. Benign essential HTN  SBP is elevated, recently seen by Dr. Radford Pax and changed Hydralazine to 10 mg twice daily just a couple of days ago, advised to monitor bp after lunch, may need to take prn   2. Pulmonary hypertension ([place])  Keep follow up with cardiologist   3. Chronic kidney disease, stage III (moderate) (HCC)  Keep follow up with nephrologist, has secondary hyperparathyroidism.   4. PVD (peripheral vascular disease) ([place])  Not walking much, denies claudication at this time.   5. Prostate cancer [facility])  He has been going to Dr. Rosana Hoes every 6 months, he is off Lupron now.   6. Anemia of chronic disease  Monitored by nephrologist   7. Dyslipidemia  On statin therapy   8. Secondary hyperparathyroidism of renal origin [facility])  Last labs Feb 2019   9. Dementia without behavioral disturbance, unspecified dementia type  - donepezil (ARICEPT) 10 MG tablet; Take 1 tablet (10 mg total) by mouth at bedtime.  Dispense: 90 tablet; Refill: 1  10. Vitamin D deficiency  - Cholecalciferol (VITAMIN D) 2000 units CAPS; Take 1 capsule (2,000 Units total) by mouth daily.  Dispense: 30 capsule; Refill: 0

## 2018-03-05 DIAGNOSIS — H401123 Primary open-angle glaucoma, left eye, severe stage: Secondary | ICD-10-CM | POA: Diagnosis not present

## 2018-03-05 DIAGNOSIS — H35352 Cystoid macular degeneration, left eye: Secondary | ICD-10-CM | POA: Diagnosis not present

## 2018-03-11 ENCOUNTER — Telehealth: Payer: Self-pay | Admitting: Cardiology

## 2018-03-11 NOTE — Telephone Encounter (Signed)
New Message   Patient's wife calling   Pt c/o BP issue: STAT if pt c/o blurred vision, one-sided weakness or slurred speech  1. What are your last 5 BP readings? 06/25 124/59 06/26 116/59 06/27 136/64 06/28 116/58 06/29 118/58 07/1   116/57  07/02/ 88/53 2. Are you having any other symptoms (ex. Dizziness, headache, blurred vision, passed out)? nO   3. What is your BP issue? Patient was told to call back with a week worth of readings.

## 2018-03-11 NOTE — Telephone Encounter (Signed)
Spoke with the patient's wife and informed her that Dr Radford Pax reviewed the most recent Bps and recommends continuing current medications.  She verbalized understanding.

## 2018-03-11 NOTE — Telephone Encounter (Signed)
BP looks good - continue current meds

## 2018-03-16 ENCOUNTER — Telehealth: Payer: Self-pay | Admitting: Family Medicine

## 2018-03-16 NOTE — Telephone Encounter (Signed)
Copied from [place] [phone]. Topic: Quick Communication - See Telephone Encounter >> Mar 16, 2018  4:18 PM Boyd Kerbs wrote: CRM for notification. See Telephone encounter for: 03/16/18. Wife, Darrick Meigs called with BP readings : 103/57 - 7/1 88/53  - 7/2 106/55 - 7/3 102/58  - 7/4 104/58 - 7/5 102/56 - 7/6  This is information for Dr Ancil Boozer

## 2018-03-17 NOTE — Telephone Encounter (Signed)
BP is low now, I suggest taking hydralazine twice daily only if bp is above 130/80, otherwise hold dose of hydralazine

## 2018-03-18 NOTE — Telephone Encounter (Signed)
I called to inform this patient's wife of Dr. Ancil Boozer message and she said ok.

## 2018-03-19 DIAGNOSIS — H35352 Cystoid macular degeneration, left eye: Secondary | ICD-10-CM | POA: Diagnosis not present

## 2018-04-09 ENCOUNTER — Ambulatory Visit (INDEPENDENT_AMBULATORY_CARE_PROVIDER_SITE_OTHER): Payer: Medicare HMO

## 2018-04-09 VITALS — BP 166/70 | HR 56 | Temp 97.8°F | Resp 12 | Ht 71.0 in | Wt 185.3 lb

## 2018-04-09 DIAGNOSIS — Z0000 Encounter for general adult medical examination without abnormal findings: Secondary | ICD-10-CM | POA: Diagnosis not present

## 2018-04-09 NOTE — Patient Instructions (Addendum)
[name] , Thank you for taking time to come for your Medicare Wellness Visit. I appreciate your ongoing commitment to your health goals. Please review the following plan we discussed and let me know if I can assist you in the future.   Screening recommendations/referrals: Colorectal Screening: No longer required  Vision and Dental Exams: Recommended annual ophthalmology exams for early detection of glaucoma and other disorders of the eye Recommended annual dental exams for proper oral hygiene  Vaccinations: Influenza vaccine: Up to date Pneumococcal vaccine: Up to date Tdap vaccine: Declined. Please call your insurance company to determine your out of pocket expense. You may also receive this vaccine at your local pharmacy or Health Dept. Shingles vaccine: Please call your insurance company to determine your out of pocket expense for the Shingrix vaccine. You may also receive this vaccine at your local pharmacy or Health Dept.  Advanced directives: Advance directive discussed with you today. I have provided a copy for you to complete at home and have notarized. Once this is complete please bring a copy in to our office so we can scan it into your chart.  Goals: Recommend to drink at least 6-8 8oz glasses of water per day.  Next appointment: Please schedule your Annual Wellness Visit with your Nurse Health Advisor in one year.  Preventive Care 16 Years and Older, Male Preventive care refers to lifestyle choices and visits with your health care provider that can promote health and wellness. What does preventive care include?  A yearly physical exam. This is also called an annual well check.  Dental exams once or twice a year.  Routine eye exams. Ask your health care provider how often you should have your eyes checked.  Personal lifestyle choices, including:  Daily care of your teeth and gums.  Regular physical activity.  Eating a healthy diet.  Avoiding tobacco and drug  use.  Limiting alcohol use.  Practicing safe sex.  Taking low doses of aspirin every day.  Taking vitamin and mineral supplements as recommended by your health care provider. What happens during an annual well check? The services and screenings done by your health care provider during your annual well check will depend on your age, overall health, lifestyle risk factors, and family history of disease. Counseling  Your health care provider may ask you questions about your:  Alcohol use.  Tobacco use.  Drug use.  Emotional well-being.  Home and relationship well-being.  Sexual activity.  Eating habits.  History of falls.  Memory and ability to understand (cognition).  Work and work Statistician. Screening  You may have the following tests or measurements:  Height, weight, and BMI.  Blood pressure.  Lipid and cholesterol levels. These may be checked every 5 years, or more frequently if you are over 7 years old.  Skin check.  Lung cancer screening. You may have this screening every year starting at age 48 if you have a 30-pack-year history of smoking and currently smoke or have quit within the past 15 years.  Fecal occult blood test (FOBT) of the stool. You may have this test every year starting at age 8.  Flexible sigmoidoscopy or colonoscopy. You may have a sigmoidoscopy every 5 years or a colonoscopy every 10 years starting at age 20.  Prostate cancer screening. Recommendations will vary depending on your family history and other risks.  Hepatitis C blood test.  Hepatitis B blood test.  Sexually transmitted disease (STD) testing.  Diabetes screening. This is done by checking  your blood sugar (glucose) after you have not eaten for a while (fasting). You may have this done every 1-3 years.  Abdominal aortic aneurysm (AAA) screening. You may need this if you are a current or former smoker.  Osteoporosis. You may be screened starting at age 43 if you are at  high risk. Talk with your health care provider about your test results, treatment options, and if necessary, the need for more tests. Vaccines  Your health care provider may recommend certain vaccines, such as:  Influenza vaccine. This is recommended every year.  Tetanus, diphtheria, and acellular pertussis (Tdap, Td) vaccine. You may need a Td booster every 10 years.  Zoster vaccine. You may need this after age 39.  Pneumococcal 13-valent conjugate (PCV13) vaccine. One dose is recommended after age 32.  Pneumococcal polysaccharide (PPSV23) vaccine. One dose is recommended after age 31. Talk to your health care provider about which screenings and vaccines you need and how often you need them. This information is not intended to replace advice given to you by your health care provider. Make sure you discuss any questions you have with your health care provider. Document Released: 09/22/2015 Document Revised: 05/15/2016 Document Reviewed: 06/27/2015 Elsevier Interactive Patient Education  2017 [place] Prevention in the Home Falls can cause injuries. They can happen to people of all ages. There are many things you can do to make your home safe and to help prevent falls. What can I do on the outside of my home?  Regularly fix the edges of walkways and driveways and fix any cracks.  Remove anything that might make you trip as you walk through a door, such as a raised step or threshold.  Trim any bushes or trees on the path to your home.  Use bright outdoor lighting.  Clear any walking paths of anything that might make someone trip, such as rocks or tools.  Regularly check to see if handrails are loose or broken. Make sure that both sides of any steps have handrails.  Any raised decks and porches should have guardrails on the edges.  Have any leaves, snow, or ice cleared regularly.  Use sand or salt on walking paths during winter.  Clean up any spills in your garage  right away. This includes oil or grease spills. What can I do in the bathroom?  Use night lights.  Install grab bars by the toilet and in the tub and shower. Do not use towel bars as grab bars.  Use non-skid mats or decals in the tub or shower.  If you need to sit down in the shower, use a plastic, non-slip stool.  Keep the floor dry. Clean up any water that spills on the floor as soon as it happens.  Remove soap buildup in the tub or shower regularly.  Attach bath mats securely with double-sided non-slip rug tape.  Do not have throw rugs and other things on the floor that can make you trip. What can I do in the bedroom?  Use night lights.  Make sure that you have a light by your bed that is easy to reach.  Do not use any sheets or blankets that are too big for your bed. They should not hang down onto the floor.  Have a firm chair that has side arms. You can use this for support while you get dressed.  Do not have throw rugs and other things on the floor that can make you trip. What can I do  in the kitchen?  Clean up any spills right away.  Avoid walking on wet floors.  Keep items that you use a lot in easy-to-reach places.  If you need to reach something above you, use a strong step stool that has a grab bar.  Keep electrical cords out of the way.  Do not use floor polish or wax that makes floors slippery. If you must use wax, use non-skid floor wax.  Do not have throw rugs and other things on the floor that can make you trip. What can I do with my stairs?  Do not leave any items on the stairs.  Make sure that there are handrails on both sides of the stairs and use them. Fix handrails that are broken or loose. Make sure that handrails are as long as the stairways.  Check any carpeting to make sure that it is firmly attached to the stairs. Fix any carpet that is loose or worn.  Avoid having throw rugs at the top or bottom of the stairs. If you do have throw rugs,  attach them to the floor with carpet tape.  Make sure that you have a light switch at the top of the stairs and the bottom of the stairs. If you do not have them, ask someone to add them for you. What else can I do to help prevent falls?  Wear shoes that:  Do not have high heels.  Have rubber bottoms.  Are comfortable and fit you well.  Are closed at the toe. Do not wear sandals.  If you use a stepladder:  Make sure that it is fully opened. Do not climb a closed stepladder.  Make sure that both sides of the stepladder are locked into place.  Ask someone to hold it for you, if possible.  Clearly mark and make sure that you can see:  Any grab bars or handrails.  First and last steps.  Where the edge of each step is.  Use tools that help you move around (mobility aids) if they are needed. These include:  Canes.  Walkers.  Scooters.  Crutches.  Turn on the lights when you go into a dark area. Replace any light bulbs as soon as they burn out.  Set up your furniture so you have a clear path. Avoid moving your furniture around.  If any of your floors are uneven, fix them.  If there are any pets around you, be aware of where they are.  Review your medicines with your doctor. Some medicines can make you feel dizzy. This can increase your chance of falling. Ask your doctor what other things that you can do to help prevent falls. This information is not intended to replace advice given to you by your health care provider. Make sure you discuss any questions you have with your health care provider. Document Released: 06/22/2009 Document Revised: 02/01/2016 Document Reviewed: 09/30/2014 Elsevier Interactive Patient Education  2017 [employer].

## 2018-04-09 NOTE — Progress Notes (Addendum)
Subjective:   Patrick [name]. is a 84 y.o. male who presents for Medicare Annual/Subsequent preventive examination.  Review of Systems:  N/A Cardiac Risk Factors include: advanced age (>62men, >59 women);dyslipidemia;hypertension;male gender;sedentary lifestyle     Objective:    Vitals: BP (!) 166/70 (BP Location: Right Arm, Patient Position: Sitting, Cuff Size: Normal)   Pulse (!) 56   Temp 97.8 F (36.6 C) (Oral)   Resp 12   Ht 5\' 11"  (1.803 m)   Wt 185 lb 4.8 oz (84.1 kg)   SpO2 96%   BMI 25.84 kg/m   Body mass index is 25.84 kg/m.   Discussed above B/P with Dr. Ancil Boozer. Recommended pt to continue with antihypertensives as ordered and to continue to monitor B/P's at home. Monitor for s/sx of chest pain/pressure, dizziness, dyspnea, numbness/tingling of extremity and slurred speech. Advised to proceed to ED for further evaluation. Keep appt as scheduled with Dr. Ancil Boozer. Pt verbalized acceptance and understanding.  Advanced Directives 04/09/2018 03/28/2017 01/13/2017 11/26/2016 11/14/2016 10/21/2016 09/05/2016  Does Patient Have a Medical Advance Directive? Yes Yes Yes Yes Yes No No  Type of Paramedic of [place];Living will [place];Living will [place];Living will Living will Living will - -  Does patient want to make changes to medical advance directive? - - - - No - Patient declined - -  Copy of [place] in Chart? No - copy requested - No - copy requested - - - -  Would patient like information on creating a medical advance directive? - - - - - No - Patient declined No - Patient declined    Tobacco Social History   Tobacco Use  Smoking Status Former Smoker  . Packs/day: 0.30  . Years: 20.00  . Pack years: 6.00  . Types: Cigarettes  . Last attempt to quit: 08/26/1978  . Years since quitting: 39.6  Smokeless Tobacco Never Used  Tobacco Comment   smoking cessation materials not required       Counseling given: No Comment: smoking cessation materials not required   Clinical Intake:  Pre-visit preparation completed: Yes  Pain : No/denies pain     BMI - recorded: 25.84 Nutritional Status: BMI of 19-24  Normal Nutritional Risks: None Diabetes: No  How often do you need to have someone help you when you read instructions, pamphlets, or other written materials from your doctor or pharmacy?: 1 - Never  Interpreter Needed?: No  Information entered by :: AEversole, LPN  Past Medical History:  Diagnosis Date  . Anemia   . Arthritis   . Bradycardia   . Chronic kidney disease   . GERD (gastroesophageal reflux disease)   . High cholesterol   . Hypertension   . IBS (irritable bowel syndrome)   . Pneumonia   . Prostate cancer ([place])   . Pulmonary HTN ([place]) 11/05/2015   68mmHg on echo 2017  . PVD (peripheral vascular disease) ([place])    Past Surgical History:  Procedure Laterality Date  . BASCILIC VEIN TRANSPOSITION Left 09/05/2016   Procedure: FIRST STAGE BASILIC VEIN TRANSPOSITION left arm;  Surgeon: Serafina Mitchell, MD;  Location: [facility];  Service: Vascular;  Laterality: Left;  . BASCILIC VEIN TRANSPOSITION Left 11/14/2016   Procedure: BASCILIC VEIN TRANSPOSITION-LEFT ARM 2ND STAGE;  Surgeon: Serafina Mitchell, MD;  Location: [facility];  Service: Vascular;  Laterality: Left;  . COLONOSCOPY    . PROSTATE SURGERY    . PROSTATECTOMY  Family History  Problem Relation Age of Onset  . CVA Mother   . Hypertension Mother   . Dementia Mother   . Heart attack Father   . Heart disease Father        before age 82  . Hypertension Father   . Hepatitis C Sister   . Dementia Sister    Social History   Socioeconomic History  . Marital status: Married    Spouse name: Altha Harm  . Number of children: 1  . Years of education: Not on file  . Highest education level: 6th grade  Occupational History    Employer: [employer]  Social Needs  . Financial resource strain: Not  hard at all  . Food insecurity:    Worry: Never true    Inability: Never true  . Transportation needs:    Medical: No    Non-medical: No  Tobacco Use  . Smoking status: Former Smoker    Packs/day: 0.30    Years: 20.00    Pack years: 6.00    Types: Cigarettes    Last attempt to quit: 08/26/1978    Years since quitting: 39.6  . Smokeless tobacco: Never Used  . Tobacco comment: smoking cessation materials not required  Substance and Sexual Activity  . Alcohol use: No    Alcohol/week: 0.0 oz  . Drug use: No  . Sexual activity: Not Currently  Lifestyle  . Physical activity:    Days per week: 0 days    Minutes per session: 0 min  . Stress: Not at all  Relationships  . Social connections:    Talks on phone: Patient refused    Gets together: Patient refused    Attends religious service: Patient refused    Active member of club or organization: Patient refused    Attends meetings of clubs or organizations: Patient refused    Relationship status: Married  Other Topics Concern  . Not on file  Social History Narrative   Lives w/ wife   Caffeine use: none   Right handed     Outpatient Encounter Medications as of 04/09/2018  Medication Sig  . amLODipine (NORVASC) 10 MG tablet Take 1 tablet (10 mg total) by mouth daily.  [name] Kitchen aspirin 81 MG tablet Take 81 mg by mouth daily.   [name] Kitchen atorvastatin (LIPITOR) 40 MG tablet Take 1 tablet (40 mg total) by mouth every evening.  . brimonidine (ALPHAGAN) 0.2 % ophthalmic solution Place 1 drop into both eyes 2 (two) times daily.  . cetirizine (ZYRTEC) 10 MG tablet Take 10 mg by mouth daily as needed for allergies.  . Cholecalciferol (VITAMIN D) 2000 units CAPS Take 1 capsule (2,000 Units total) by mouth daily.  . Difluprednate (DUREZOL OP) Place 1 drop into the left eye 3 (three) times daily.  [name] Kitchen donepezil (ARICEPT) 10 MG tablet Take 1 tablet (10 mg total) by mouth at bedtime.  . dorzolamide-timolol (COSOPT) 22.3-6.8 MG/ML ophthalmic solution   .  hydrALAZINE (APRESOLINE) 10 MG tablet Take 1 tablet (10 mg total) by mouth 2 (two) times daily.  . IRON PO Take 1 tablet by mouth daily.  [name] Kitchen ketorolac (ACULAR) 0.5 % ophthalmic solution INT 1 GTT INTO OD QID  . latanoprost (XALATAN) 0.005 % ophthalmic solution Place 1 drop into both eyes at bedtime.   . Multiple Vitamin (MULTIVITAMIN WITH MINERALS) TABS tablet Take 1 tablet by mouth daily.  . vitamin C (ASCORBIC ACID) 500 MG tablet Take 500 mg by mouth daily.  . Vitamin E 400  UNITS CHEW Chew 400 Units by mouth daily.   [name] Kitchen olmesartan (BENICAR) 40 MG tablet Take 1 tablet (40 mg total) by mouth daily. (Patient not taking: Reported on 04/09/2018)   No facility-administered encounter medications on file as of 04/09/2018.     Activities of Daily Living In your present state of health, do you have any difficulty performing the following activities: 04/09/2018 03/04/2018  Hearing? N N  Comment denies hearing aids -  Vision? N N  Comment wears eyeglasses; glaucoma -  Difficulty concentrating or making decisions? N Y  Walking or climbing stairs? N N  Dressing or bathing? N N  Doing errands, shopping? N N  Preparing Food and eating ? N -  Comment denies dentures -  Using the Toilet? N -  In the past six months, have you accidently leaked urine? N -  Do you have problems with loss of bowel control? N -  Managing your Medications? N -  Managing your Finances? N -  Housekeeping or managing your Housekeeping? N -  Some recent data might be hidden    Patient Care Team: Steele Sizer, MD as PCP - General (Family Medicine) Edrick Oh, MD as Consulting Physician (Nephrology) Myrlene Broker, MD as Attending Physician (Urology) Sueanne Margarita, MD as Consulting Physician (Cardiology)   Assessment:   This is a routine wellness examination for [facility]  Exercise Activities and Dietary recommendations Current Exercise Habits: The patient does not participate in regular exercise at present, Exercise  limited by: None identified  Goals    . DIET - INCREASE WATER INTAKE     Recommend to drink at least 6-8 8oz glasses of water per day.       Fall Risk Fall Risk  04/09/2018 03/04/2018 07/07/2017 11/26/2016 04/22/2016  Falls in the past year? No No No No No  Risk for fall due to : Impaired vision - - - -  Risk for fall due to: Comment wears eyeglasses - - - -   FALL RISK PREVENTION PERTAINING TO HOME: Is your home free of loose throw rugs in walkways, pet beds, electrical cords, etc? Yes Is there adequate lighting in your home to reduce risk of falls?  Yes Are there stairs in or around your home WITH handrails? No stairs  ASSISTIVE DEVICES UTILIZED TO PREVENT FALLS: Use of a cane, walker or w/c? No Grab bars in the bathroom? Yes  Shower chair or a place to sit while bathing? Yes An elevated toilet seat or a handicapped toilet? Yes  Timed Get Up and Go Performed: Yes. Pt ambulated 10 feet within 22 sec. Gait slow, steady and without the use of an assistive device. No intervention required at this time. Fall risk prevention has been discussed.  Community Resource Referral:  [place] Referral not required at this time.   Depression Screen PHQ 2/9 Scores 04/09/2018 03/04/2018 11/26/2016 04/22/2016  PHQ - 2 Score 0 0 0 0  PHQ- 9 Score 0 - - -    Cognitive Function MMSE - Mini Mental State Exam 01/01/2018 07/07/2017  Orientation to time 1 0  Orientation to Place 4 5  Registration 3 0  Attention/ Calculation 0 0  Recall 0 0  Language- name 2 objects 2 2  Language- repeat 1 1  Language- follow 3 step command 3 3  Language- read & follow direction 1 1  Write a sentence 1 1  Copy design 1 0  Total score 17 13     6CIT Screen  04/09/2018  What Year? 4 points  What month? 3 points  What time? 3 points  Count back from 20 0 points  Months in reverse 0 points  Repeat phrase 10 points  Total Score 20    Immunization History  Administered Date(s) Administered  .  Influenza, High Dose Seasonal PF 06/13/2015, 08/26/2016, 07/07/2017  . Pneumococcal Conjugate-13 03/23/2015  . Pneumococcal Polysaccharide-23 08/26/2016    Qualifies for Shingles Vaccine? Yes. Due for Shingrix. Education has been provided regarding the importance of this vaccine. Pt has been advised to call insurance company to determine out of pocket expense. Advised may also receive vaccine at local pharmacy or Health Dept. Verbalized acceptance and understanding.  Due for Tdap vaccine. Education has been provided regarding the importance of this vaccine. Advised may receive this vaccine at local pharmacy or Health Dept. Aware to provide a copy of the vaccination record if obtained from local pharmacy or Health Dept. Verbalized acceptance and understanding.   Screening Tests Health Maintenance  Topic Date Due  . INFLUENZA VACCINE  04/09/2018  . TETANUS/TDAP  07/06/2019 (Originally 10/09/1950)  . PNA vac Low Risk Adult  Completed   Cancer Screenings: Lung: Low Dose CT Chest recommended if Age 59-80 years, 30 pack-year currently smoking OR have quit w/in 15years. Patient does not qualify. Colorectal: No longer required  Additional Screenings: Hepatitis C Screening: Does not qualify     Plan:  I have personally reviewed and addressed the Medicare Annual Wellness questionnaire and have noted the following in the patient's chart:  A. Medical and social history B. Use of alcohol, tobacco or illicit drugs  C. Current medications and supplements D. Functional ability and status E.  Nutritional status F.  Physical activity G. Advance directives H. List of other physicians I.  Hospitalizations, surgeries, and ER visits in previous 12 months J.  [place] such as hearing and vision if needed, cognitive and depression L. Referrals and appointments  In addition, I have reviewed and discussed with patient certain preventive protocols, quality metrics, and best practice  recommendations. A written personalized care plan for preventive services as well as general preventive health recommendations were provided to patient.  See attached scanned questionnaire for additional information.   Signed,  Aleatha Borer, LPN Nurse Health Advisor  I have reviewed this encounter including the documentation in this note and/or discussed this patient with the provider, Aleatha Borer, LPN. I am certifying that I agree with the content of this note as supervising physician.  Steele Sizer, MD [facility] 04/09/2018, 2:38 PM   Reviewed bp log at home and it has been at goal, continue medications and keep next follow up with me

## 2018-04-27 DIAGNOSIS — H43811 Vitreous degeneration, right eye: Secondary | ICD-10-CM | POA: Diagnosis not present

## 2018-04-27 DIAGNOSIS — H35352 Cystoid macular degeneration, left eye: Secondary | ICD-10-CM | POA: Diagnosis not present

## 2018-05-05 DIAGNOSIS — H43811 Vitreous degeneration, right eye: Secondary | ICD-10-CM | POA: Diagnosis not present

## 2018-05-05 DIAGNOSIS — H35352 Cystoid macular degeneration, left eye: Secondary | ICD-10-CM | POA: Diagnosis not present

## 2018-05-06 DIAGNOSIS — H2511 Age-related nuclear cataract, right eye: Secondary | ICD-10-CM | POA: Diagnosis not present

## 2018-05-06 DIAGNOSIS — H25811 Combined forms of age-related cataract, right eye: Secondary | ICD-10-CM | POA: Diagnosis not present

## 2018-05-06 DIAGNOSIS — Z01818 Encounter for other preprocedural examination: Secondary | ICD-10-CM | POA: Diagnosis not present

## 2018-05-06 DIAGNOSIS — H409 Unspecified glaucoma: Secondary | ICD-10-CM | POA: Diagnosis not present

## 2018-05-06 DIAGNOSIS — H401113 Primary open-angle glaucoma, right eye, severe stage: Secondary | ICD-10-CM | POA: Diagnosis not present

## 2018-05-06 DIAGNOSIS — H401112 Primary open-angle glaucoma, right eye, moderate stage: Secondary | ICD-10-CM | POA: Diagnosis not present

## 2018-05-29 DIAGNOSIS — R972 Elevated prostate specific antigen [PSA]: Secondary | ICD-10-CM | POA: Diagnosis not present

## 2018-06-08 ENCOUNTER — Encounter: Payer: Self-pay | Admitting: Family Medicine

## 2018-06-08 ENCOUNTER — Ambulatory Visit (INDEPENDENT_AMBULATORY_CARE_PROVIDER_SITE_OTHER): Payer: Medicare HMO | Admitting: Family Medicine

## 2018-06-08 VITALS — BP 132/68 | HR 72 | Temp 98.4°F | Resp 16 | Ht 71.0 in | Wt 182.9 lb

## 2018-06-08 DIAGNOSIS — E441 Mild protein-calorie malnutrition: Secondary | ICD-10-CM

## 2018-06-08 DIAGNOSIS — N183 Chronic kidney disease, stage 3 unspecified: Secondary | ICD-10-CM

## 2018-06-08 DIAGNOSIS — C61 Malignant neoplasm of prostate: Secondary | ICD-10-CM | POA: Diagnosis not present

## 2018-06-08 DIAGNOSIS — I10 Essential (primary) hypertension: Secondary | ICD-10-CM

## 2018-06-08 DIAGNOSIS — Z23 Encounter for immunization: Secondary | ICD-10-CM

## 2018-06-08 DIAGNOSIS — N2581 Secondary hyperparathyroidism of renal origin: Secondary | ICD-10-CM

## 2018-06-08 DIAGNOSIS — I739 Peripheral vascular disease, unspecified: Secondary | ICD-10-CM | POA: Diagnosis not present

## 2018-06-08 DIAGNOSIS — I272 Pulmonary hypertension, unspecified: Secondary | ICD-10-CM

## 2018-06-08 DIAGNOSIS — E785 Hyperlipidemia, unspecified: Secondary | ICD-10-CM | POA: Diagnosis not present

## 2018-06-08 DIAGNOSIS — F0390 Unspecified dementia without behavioral disturbance: Secondary | ICD-10-CM

## 2018-06-08 DIAGNOSIS — D638 Anemia in other chronic diseases classified elsewhere: Secondary | ICD-10-CM | POA: Diagnosis not present

## 2018-06-08 MED ORDER — OLMESARTAN MEDOXOMIL 40 MG PO TABS: 40.0000 mg | ORAL_TABLET | Freq: Every day | ORAL | 1 refills | Status: DC

## 2018-06-08 MED ORDER — AMLODIPINE BESYLATE 10 MG PO TABS: 10.0000 mg | ORAL_TABLET | Freq: Every day | ORAL | 1 refills | Status: DC

## 2018-06-08 MED ORDER — HYDRALAZINE HCL 10 MG PO TABS: 10.0000 mg | ORAL_TABLET | Freq: Two times a day (BID) | ORAL | 1 refills | Status: DC

## 2018-06-08 NOTE — Progress Notes (Signed)
Name: Patrick [name].   MRN: [id number]    DOB: 1932-03-01   Date:06/08/2018       Progress Note  Subjective  Chief Complaint  Chief Complaint  Patient presents with  . Medication Refill  . Hypertension    Denies any symptoms  . Dyslipidemia  . Dementia  . Prostate Cancer  . Chronic Kidney Disease    HPI  HTN: Hewas onValsartan, Norvasc and Hydralazine BID  bp is at goal today. Not checking bp at home lately because fistul on left arm, and he has difficulty using left hand to place monitor on left side.No chest pain or palpitation at this time  Angina:no recent episodes of chest pain, no SOB, he denies orthopnea. No recent problems.   Bone Pain: he has multiple bone scans and negative for metastasis.He states he is doing much, no longer having pain.Symptoms were severe 2017, since he has been off Lupron. No longer has bone pain , but PSA is going up and will resume therapy with urologist   Pulmonary hypertension: discussed Revatio therapy, but wife and patient are worried about possible side effects. Unchanged   CKI stageIIIcontinue follow up with nephrologist. - [facility] , fistula placed on left upper arm back in 11/2016 and is doing well, has not started HD yet, his kidney went from stage IV to stage III, seeing Nephrologist that is monitoring his labs.   Prostate Cancer: currently seeing Urologist at [facility], Dr. Rosana Hoes., PSA trending up and is going to resume therapy   Dyslipidemia: still taking Atorvastatin , compliant with medication , denies side effects  Dementia: wife states symptoms are stable, slightly forgetful, wife is now dispensing his medications. Still able to drive to work and back daily. . At work he is also getting a little forgetful . MMS 19 first time, refuses to recheck, CIT 6 done recently also abnormal. On Aricept 10 mg Fall 2018, seen by Neurologist since and normal RPR, B12 and CRP. Wife stopped giving him Aricept because it was  not working.   PVD: swelling resolved, has some cramping, but not associated with activity , wearing compression stocking hoses   Malnutrition: his weight has gone down from 220 lbs average to 182 lbs today, a drop of 12 lbs int he past year, he has temporal waiting, eats very little, he states not depressed, discussed adding protein supplementation to avoid further weight loss  Patient Active Problem List   Diagnosis Date Noted  . Secondary hyperparathyroidism ([place]) 11/26/2016  . Mild cognitive impairment 01/03/2016  . Elevated prostate specific antigen (PSA) 09/08/2015  . Bone pain 06/13/2015  . Spinal stenosis 06/13/2015  . OP (osteoporosis) 03/20/2015  . Allergic rhinitis 03/20/2015  . Lumbar canal stenosis 03/20/2015  . Vitamin D deficiency 03/20/2015  . Restless legs syndrome 03/20/2015  . Drug-induced gynecomastia 03/20/2015  . Diaphragmatic hernia 03/20/2015  . Acquired cyst of kidney 03/20/2015  . Left ventricular hypertrophy by electrocardiogram 03/20/2015  . Leg pain 03/20/2015  . Chronic kidney disease, stage IV (severe) ([place]) 03/20/2015  . History of pneumonia 03/20/2015  . Non-rheumatic tricuspid valve insufficiency 03/20/2015  . Dysmetabolic syndrome 28/41/3244  . Pulmonary hypertension ([place]) 10/31/2014  . Benign essential HTN 07/29/2014  . Dyslipidemia 07/29/2014  . PVD (peripheral vascular disease) ([place])   . Anemia of chronic disease   . IBS (irritable bowel syndrome)   . Bradycardia   . ED (erectile dysfunction) of organic origin 06/29/2012  . Genuine stress incontinence, male 06/29/2012  .  Malignant neoplasm of prostate ([place]) 06/29/2012    Past Surgical History:  Procedure Laterality Date  . BASCILIC VEIN TRANSPOSITION Left 09/05/2016   Procedure: FIRST STAGE BASILIC VEIN TRANSPOSITION left arm;  Surgeon: Serafina Mitchell, MD;  Location: [place];  Service: Vascular;  Laterality: Left;  . BASCILIC VEIN TRANSPOSITION Left 11/14/2016   Procedure: BASCILIC VEIN  TRANSPOSITION-LEFT ARM 2ND STAGE;  Surgeon: Serafina Mitchell, MD;  Location: [place];  Service: Vascular;  Laterality: Left;  . COLONOSCOPY    . PROSTATE SURGERY    . PROSTATECTOMY      Family History  Problem Relation Age of Onset  . CVA Mother   . Hypertension Mother   . Dementia Mother   . Heart attack Father   . Heart disease Father        before age 24  . Hypertension Father   . Hepatitis C Sister   . Dementia Sister     Social History   Socioeconomic History  . Marital status: Married    Spouse name: Altha Harm  . Number of children: 1  . Years of education: Not on file  . Highest education level: 6th grade  Occupational History    Employer: [employer]  Social Needs  . Financial resource strain: Not hard at all  . Food insecurity:    Worry: Never true    Inability: Never true  . Transportation needs:    Medical: No    Non-medical: No  Tobacco Use  . Smoking status: Former Smoker    Packs/day: 0.30    Years: 20.00    Pack years: 6.00    Types: Cigarettes    Last attempt to quit: 08/26/1978    Years since quitting: 39.8  . Smokeless tobacco: Never Used  . Tobacco comment: smoking cessation materials not required  Substance and Sexual Activity  . Alcohol use: No    Alcohol/week: 0.0 standard drinks  . Drug use: No  . Sexual activity: Not Currently  Lifestyle  . Physical activity:    Days per week: 0 days    Minutes per session: 0 min  . Stress: Not at all  Relationships  . Social connections:    Talks on phone: Patient refused    Gets together: Patient refused    Attends religious service: Patient refused    Active member of club or organization: Patient refused    Attends meetings of clubs or organizations: Patient refused    Relationship status: Married  . Intimate partner violence:    Fear of current or ex partner: No    Emotionally abused: No    Physically abused: No    Forced sexual activity: No  Other Topics Concern  . Not on file  Social  History Narrative   Lives w/ wife   Caffeine use: none   Right handed      Current Outpatient Medications:  .  amLODipine (NORVASC) 10 MG tablet, Take 1 tablet (10 mg total) by mouth daily., Disp: 90 tablet, Rfl: 1 .  aspirin 81 MG tablet, Take 81 mg by mouth daily. , Disp: , Rfl:  .  atorvastatin (LIPITOR) 40 MG tablet, Take 1 tablet (40 mg total) by mouth every evening., Disp: 90 tablet, Rfl: 3 .  brimonidine (ALPHAGAN) 0.2 % ophthalmic solution, Place 1 drop into both eyes 2 (two) times daily., Disp: , Rfl: 0 .  brimonidine-timolol (COMBIGAN) 0.2-0.5 % ophthalmic solution, Place 1 drop into both eyes daily., Disp: , Rfl:  .  cetirizine (ZYRTEC) 10 MG tablet, Take 10 mg by mouth daily as needed for allergies., Disp: , Rfl:  .  Cholecalciferol (VITAMIN D) 2000 units CAPS, Take 1 capsule (2,000 Units total) by mouth daily., Disp: 30 capsule, Rfl: 0 .  hydrALAZINE (APRESOLINE) 10 MG tablet, Take 1 tablet (10 mg total) by mouth 2 (two) times daily., Disp: 180 tablet, Rfl: 1 .  IRON PO, Take 1 tablet by mouth daily., Disp: , Rfl:  .  latanoprost (XALATAN) 0.005 % ophthalmic solution, Place 1 drop into both eyes at bedtime. , Disp: , Rfl:  .  Multiple Vitamin (MULTIVITAMIN WITH MINERALS) TABS tablet, Take 1 tablet by mouth daily., Disp: , Rfl:  .  olmesartan (BENICAR) 40 MG tablet, Take 1 tablet (40 mg total) by mouth daily., Disp: 90 tablet, Rfl: 1 .  vitamin C (ASCORBIC ACID) 500 MG tablet, Take 500 mg by mouth daily., Disp: , Rfl:  .  Vitamin E 400 UNITS CHEW, Chew 400 Units by mouth daily. , Disp: , Rfl:  .  Difluprednate (DUREZOL OP), Place 1 drop into the left eye 3 (three) times daily., Disp: , Rfl:  .  dorzolamide-timolol (COSOPT) 22.3-6.8 MG/ML ophthalmic solution, , Disp: , Rfl: 1  Allergies  Allergen Reactions  . Ace Inhibitors Cough    cough    I personally reviewed active problem list, medication list, allergies, family history, social history with the patient/caregiver  today.   ROS  Constitutional: Negative for fever , positive for  weight change - losing .  Respiratory: Negative for cough and shortness of breath.   Cardiovascular: Negative for chest pain or palpitations.  Gastrointestinal: Negative for abdominal pain, no bowel changes.  Musculoskeletal: Negative for gait problem or joint swelling.  Skin: Negative for rash.  Neurological: Negative for dizziness or headache.  No other specific complaints in a complete review of systems (except as listed in HPI above).  Objective  Vitals:   06/08/18 1441  BP: 132/68  Pulse: 72  Resp: 16  Temp: 98.4 F (36.9 C)  TempSrc: Oral  SpO2: 99%  Weight: 182 lb 14.4 oz (83 kg)  Height: 5\' 11"  (1.803 m)    Body mass index is 25.51 kg/m.  Physical Exam  Constitutional: Patient appears well-developed and mild temporal waisting . No distress. He used to be around 220 lbs HEENT: head atraumatic, normocephalic, pupils equal and reactive to light, neck supple, throat within normal limits Cardiovascular: Normal rate, regular rhythm and normal heart sounds.  No murmur heard. No BLE edema. Pulmonary/Chest: Effort normal and breath sounds normal. No respiratory distress. Abdominal: Soft.  There is no tenderness. Psychiatric: Patient has a normal mood and affect. behavior is normal. Judgment and thought content normal.   PHQ2/9: Depression screen [facility] 2/9 06/08/2018 04/09/2018 03/04/2018 11/26/2016 04/22/2016  Decreased Interest 0 0 0 0 0  Down, Depressed, Hopeless 0 0 0 0 0  PHQ - 2 Score 0 0 0 0 0  Altered sleeping 0 0 - - -  Tired, decreased energy 0 0 - - -  Change in appetite 0 0 - - -  Feeling bad or failure about yourself  0 0 - - -  Trouble concentrating 0 0 - - -  Moving slowly or fidgety/restless 0 0 - - -  Suicidal thoughts 0 0 - - -  PHQ-9 Score 0 0 - - -  Difficult doing work/chores Not difficult at all Not difficult at all - - -    Fall Risk: Fall Risk  06/08/2018 04/09/2018 03/04/2018  07/07/2017 11/26/2016  Falls in the past year? No No No No No  Risk for fall due to : - Impaired vision - - -  Risk for fall due to: Comment - wears eyeglasses - - -    Functional Status Survey: Is the patient deaf or have difficulty hearing?: No Does the patient have difficulty seeing, even when wearing glasses/contacts?: Yes(reading glasses) Does the patient have difficulty concentrating, remembering, or making decisions?: No Does the patient have difficulty walking or climbing stairs?: No Does the patient have difficulty dressing or bathing?: No Does the patient have difficulty doing errands alone such as visiting a doctor's office or shopping?: No    Assessment & Plan  1. Benign essential HTN  - amLODipine (NORVASC) 10 MG tablet; Take 1 tablet (10 mg total) by mouth daily.  Dispense: 90 tablet; Refill: 1 - hydrALAZINE (APRESOLINE) 10 MG tablet; Take 1 tablet (10 mg total) by mouth 2 (two) times daily.  Dispense: 180 tablet; Refill: 1 - olmesartan (BENICAR) 40 MG tablet; Take 1 tablet (40 mg total) by mouth daily.  Dispense: 90 tablet; Refill: 1  2. Need for immunization against influenza  - Flu vaccine HIGH DOSE PF (Fluzone High dose)  3. Pulmonary hypertension ([place])   4. Chronic kidney disease, stage III (moderate) ([facility])  - COMPLETE METABOLIC PANEL WITH GFR  5. PVD (peripheral vascular disease) ([facility])  Stable, swelling is controlled at this time  6. Prostate cancer [facility])  Under the care of urologist and PSA is going up , therefore will resume hormonal therapy soon   7. Anemia of chronic disease  Wife states monitored by nephrologist   8. Dyslipidemia  - COMPLETE METABOLIC PANEL WITH GFR - Lipid panel  9. Secondary hyperparathyroidism of renal origin [facility])  Last parathyroid very high back in Feb  10. Dementia without behavioral disturbance, unspecified dementia type [facility])  Seen by neurologist - off Aricept because it did not work   67. Claudication  ([place])   12. Mild protein-calorie malnutrition ([place])  Discussed protein chocolate

## 2018-06-09 LAB — COMPLETE METABOLIC PANEL WITH GFR
AG Ratio: 1.2 (calc) (ref 1.0–2.5)
ALBUMIN MSPROF: 4.1 g/dL (ref 3.6–5.1)
ALT: 13 U/L (ref 9–46)
AST: 15 U/L (ref 10–35)
Alkaline phosphatase (APISO): 70 U/L (ref 40–115)
BUN / CREAT RATIO: 16 (calc) (ref 6–22)
BUN: 40 mg/dL — ABNORMAL HIGH (ref 7–25)
CALCIUM: 10 mg/dL (ref 8.6–10.3)
CO2: 24 mmol/L (ref 20–32)
CREATININE: 2.49 mg/dL — AB (ref 0.70–1.11)
Chloride: 109 mmol/L (ref 98–110)
GFR, EST AFRICAN AMERICAN: 26 mL/min/{1.73_m2} — AB (ref >=60)
GFR, EST NON AFRICAN AMERICAN: 23 mL/min/{1.73_m2} — AB (ref >=60)
Globulin: 3.3 g/dL (calc) (ref 1.9–3.7)
Glucose, Bld: 115 mg/dL (ref 65–139)
Potassium: 4.5 mmol/L (ref 3.5–5.3)
Sodium: 143 mmol/L (ref 135–146)
Total Bilirubin: 0.5 mg/dL (ref 0.2–1.2)
Total Protein: 7.4 g/dL (ref 6.1–8.1)

## 2018-06-09 LAB — LIPID PANEL
CHOL/HDL RATIO: 2.2 (calc) (ref <5.0)
CHOLESTEROL: 137 mg/dL (ref <200)
HDL: 62 mg/dL (ref >40)
LDL Cholesterol (Calc): 56 mg/dL (calc)
NON-HDL CHOLESTEROL (CALC): 75 mg/dL (ref <130)
TRIGLYCERIDES: 109 mg/dL (ref <150)

## 2018-06-16 DIAGNOSIS — H35352 Cystoid macular degeneration, left eye: Secondary | ICD-10-CM | POA: Diagnosis not present

## 2018-07-06 DIAGNOSIS — H401123 Primary open-angle glaucoma, left eye, severe stage: Secondary | ICD-10-CM | POA: Diagnosis not present

## 2018-07-09 ENCOUNTER — Ambulatory Visit: Payer: Medicare HMO | Admitting: Nurse Practitioner

## 2018-07-10 DIAGNOSIS — R55 Syncope and collapse: Secondary | ICD-10-CM

## 2018-07-10 HISTORY — DX: Syncope and collapse: R55

## 2018-07-17 ENCOUNTER — Other Ambulatory Visit: Payer: Self-pay | Admitting: Family Medicine

## 2018-07-17 MED ORDER — FAMOTIDINE 20 MG PO TABS: 20.0000 mg | ORAL_TABLET | Freq: Two times a day (BID) | ORAL | 1 refills | Status: DC | PRN

## 2018-07-26 ENCOUNTER — Inpatient Hospital Stay (HOSPITAL_COMMUNITY)
Admission: EM | Admit: 2018-07-26 | Discharge: 2018-07-30 | DRG: 312 | Disposition: A | Discharge status: Home Health | Payer: Medicare HMO | Attending: Internal Medicine | Admitting: Internal Medicine

## 2018-07-26 ENCOUNTER — Encounter (HOSPITAL_COMMUNITY): Payer: Self-pay | Admitting: Emergency Medicine

## 2018-07-26 DIAGNOSIS — K219 Gastro-esophageal reflux disease without esophagitis: Secondary | ICD-10-CM | POA: Diagnosis present

## 2018-07-26 DIAGNOSIS — I739 Peripheral vascular disease, unspecified: Secondary | ICD-10-CM | POA: Diagnosis present

## 2018-07-26 DIAGNOSIS — C61 Malignant neoplasm of prostate: Secondary | ICD-10-CM | POA: Diagnosis not present

## 2018-07-26 DIAGNOSIS — G9341 Metabolic encephalopathy: Secondary | ICD-10-CM | POA: Diagnosis present

## 2018-07-26 DIAGNOSIS — R55 Syncope and collapse: Secondary | ICD-10-CM | POA: Diagnosis present

## 2018-07-26 DIAGNOSIS — I361 Nonrheumatic tricuspid (valve) insufficiency: Secondary | ICD-10-CM | POA: Diagnosis not present

## 2018-07-26 DIAGNOSIS — I10 Essential (primary) hypertension: Secondary | ICD-10-CM

## 2018-07-26 DIAGNOSIS — Z7982 Long term (current) use of aspirin: Secondary | ICD-10-CM | POA: Diagnosis not present

## 2018-07-26 DIAGNOSIS — E872 Acidosis, unspecified: Secondary | ICD-10-CM | POA: Diagnosis present

## 2018-07-26 DIAGNOSIS — E7800 Pure hypercholesterolemia, unspecified: Secondary | ICD-10-CM | POA: Diagnosis present

## 2018-07-26 DIAGNOSIS — Z888 Allergy status to other drugs, medicaments and biological substances status: Secondary | ICD-10-CM | POA: Diagnosis not present

## 2018-07-26 DIAGNOSIS — N184 Chronic kidney disease, stage 4 (severe): Secondary | ICD-10-CM | POA: Diagnosis present

## 2018-07-26 DIAGNOSIS — I272 Pulmonary hypertension, unspecified: Secondary | ICD-10-CM | POA: Diagnosis present

## 2018-07-26 DIAGNOSIS — I129 Hypertensive chronic kidney disease with stage 1 through stage 4 chronic kidney disease, or unspecified chronic kidney disease: Secondary | ICD-10-CM | POA: Diagnosis present

## 2018-07-26 DIAGNOSIS — Z8546 Personal history of malignant neoplasm of prostate: Secondary | ICD-10-CM | POA: Diagnosis not present

## 2018-07-26 DIAGNOSIS — H409 Unspecified glaucoma: Secondary | ICD-10-CM | POA: Diagnosis present

## 2018-07-26 DIAGNOSIS — Z87891 Personal history of nicotine dependence: Secondary | ICD-10-CM | POA: Diagnosis not present

## 2018-07-26 DIAGNOSIS — R4182 Altered mental status, unspecified: Secondary | ICD-10-CM | POA: Diagnosis not present

## 2018-07-26 DIAGNOSIS — Z79899 Other long term (current) drug therapy: Secondary | ICD-10-CM | POA: Diagnosis not present

## 2018-07-26 DIAGNOSIS — R42 Dizziness and giddiness: Secondary | ICD-10-CM | POA: Diagnosis not present

## 2018-07-26 DIAGNOSIS — R05 Cough: Secondary | ICD-10-CM | POA: Diagnosis not present

## 2018-07-26 DIAGNOSIS — R4020 Unspecified coma: Secondary | ICD-10-CM | POA: Diagnosis not present

## 2018-07-26 DIAGNOSIS — F0390 Unspecified dementia without behavioral disturbance: Secondary | ICD-10-CM | POA: Diagnosis present

## 2018-07-26 HISTORY — DX: Chronic kidney disease, stage 4 (severe): N18.4

## 2018-07-26 HISTORY — DX: Syncope and collapse: R55

## 2018-07-26 NOTE — ED Notes (Signed)
RN [place] to draw labs

## 2018-07-26 NOTE — ED Triage Notes (Signed)
Brought from home after being found squatted between bed and dresser.  Per wife was passed out.  Per ems refused to come to hospital.  While they were getting ready to leave wife came out saying he had passed out again.  Per patient he remembers everything and had just slid from the bed to the floor.  Patient was incontinent of stool after first syncopal episode.

## 2018-07-27 ENCOUNTER — Observation Stay (HOSPITAL_COMMUNITY): Payer: Medicare HMO

## 2018-07-27 ENCOUNTER — Emergency Department (HOSPITAL_COMMUNITY): Payer: Medicare HMO

## 2018-07-27 ENCOUNTER — Observation Stay (HOSPITAL_BASED_OUTPATIENT_CLINIC_OR_DEPARTMENT_OTHER): Payer: Medicare HMO

## 2018-07-27 DIAGNOSIS — I361 Nonrheumatic tricuspid (valve) insufficiency: Secondary | ICD-10-CM

## 2018-07-27 DIAGNOSIS — C61 Malignant neoplasm of prostate: Secondary | ICD-10-CM

## 2018-07-27 DIAGNOSIS — R05 Cough: Secondary | ICD-10-CM | POA: Diagnosis not present

## 2018-07-27 DIAGNOSIS — E872 Acidosis, unspecified: Secondary | ICD-10-CM | POA: Diagnosis present

## 2018-07-27 DIAGNOSIS — I272 Pulmonary hypertension, unspecified: Secondary | ICD-10-CM

## 2018-07-27 DIAGNOSIS — R55 Syncope and collapse: Secondary | ICD-10-CM | POA: Diagnosis present

## 2018-07-27 DIAGNOSIS — H409 Unspecified glaucoma: Secondary | ICD-10-CM | POA: Diagnosis present

## 2018-07-27 DIAGNOSIS — F0390 Unspecified dementia without behavioral disturbance: Secondary | ICD-10-CM

## 2018-07-27 LAB — URINALYSIS, ROUTINE W REFLEX MICROSCOPIC
BACTERIA UA: NONE SEEN
Bilirubin Urine: NEGATIVE
Glucose, UA: NEGATIVE mg/dL
HGB URINE DIPSTICK: NEGATIVE
KETONES UR: NEGATIVE mg/dL
Leukocytes, UA: NEGATIVE
NITRITE: NEGATIVE
Protein, ur: 100 mg/dL — AB
Specific Gravity, Urine: 1.014 (ref 1.005–1.030)
pH: 5 (ref 5.0–8.0)

## 2018-07-27 LAB — BASIC METABOLIC PANEL
ANION GAP: 4 — AB (ref 5–15)
Anion gap: 6 (ref 5–15)
BUN: 35 mg/dL — AB (ref 8–23)
BUN: 37 mg/dL — ABNORMAL HIGH (ref 8–23)
CALCIUM: 9.1 mg/dL (ref 8.9–10.3)
CALCIUM: 9.2 mg/dL (ref 8.9–10.3)
CO2: 13 mmol/L — ABNORMAL LOW (ref 22–32)
CO2: 13 mmol/L — ABNORMAL LOW (ref 22–32)
CREATININE: 2.29 mg/dL — AB (ref 0.61–1.24)
Chloride: 116 mmol/L — ABNORMAL HIGH (ref 98–111)
Chloride: 121 mmol/L — ABNORMAL HIGH (ref 98–111)
Creatinine, Ser: 2.15 mg/dL — ABNORMAL HIGH (ref 0.61–1.24)
GFR calc Af Amer: 31 mL/min — ABNORMAL LOW (ref >60)
GFR calc non Af Amer: 25 mL/min — ABNORMAL LOW (ref >60)
GFR calc non Af Amer: 27 mL/min — ABNORMAL LOW (ref >60)
GFR, EST AFRICAN AMERICAN: 28 mL/min — AB (ref >60)
Glucose, Bld: 111 mg/dL — ABNORMAL HIGH (ref 70–99)
Glucose, Bld: 144 mg/dL — ABNORMAL HIGH (ref 70–99)
POTASSIUM: 4 mmol/L (ref 3.5–5.1)
Potassium: 3.3 mmol/L — ABNORMAL LOW (ref 3.5–5.1)
SODIUM: 133 mmol/L — AB (ref 135–145)
SODIUM: 140 mmol/L (ref 135–145)

## 2018-07-27 LAB — ECHOCARDIOGRAM COMPLETE
Height: 74 in
Weight: 2927.71 oz

## 2018-07-27 LAB — CBC
HCT: 33.2 % — ABNORMAL LOW (ref 39.0–52.0)
Hemoglobin: 11 g/dL — ABNORMAL LOW (ref 13.0–17.0)
MCH: 31.9 pg (ref 26.0–34.0)
MCHC: 33.1 g/dL (ref 30.0–36.0)
MCV: 96.2 fL (ref 80.0–100.0)
NRBC: 0 % (ref 0.0–0.2)
PLATELETS: 149 10*3/uL — AB (ref 150–400)
RBC: 3.45 MIL/uL — ABNORMAL LOW (ref 4.22–5.81)
RDW: 14.1 % (ref 11.5–15.5)
WBC: 4.5 10*3/uL (ref 4.0–10.5)

## 2018-07-27 LAB — I-STAT ARTERIAL BLOOD GAS, ED
ACID-BASE DEFICIT: 14 mmol/L — AB (ref 0.0–2.0)
Bicarbonate: 12.7 mmol/L — ABNORMAL LOW (ref 20.0–28.0)
O2 Saturation: 96 %
PH ART: 7.235 — AB (ref 7.350–7.450)
PO2 ART: 94 mmHg (ref 83.0–108.0)
TCO2: 14 mmol/L — ABNORMAL LOW (ref 22–32)
pCO2 arterial: 29.9 mmHg — ABNORMAL LOW (ref 32.0–48.0)

## 2018-07-27 LAB — TROPONIN I: Troponin I: 0.04 ng/mL (ref <0.03)

## 2018-07-27 LAB — MAGNESIUM: MAGNESIUM: 2.2 mg/dL (ref 1.7–2.4)

## 2018-07-27 MED ORDER — DORZOLAMIDE HCL-TIMOLOL MAL 2-0.5 % OP SOLN
1.0000 [drp] | Freq: Two times a day (BID) | OPHTHALMIC | Status: DC
  Administered 2018-07-27 – 2018-07-30 (×6): 1 [drp] via OPHTHALMIC
  Filled 2018-07-27: qty 10

## 2018-07-27 MED ORDER — STERILE WATER FOR INJECTION IV SOLN
INTRAVENOUS | Status: DC
  Administered 2018-07-27: 06:00:00 via INTRAVENOUS
  Filled 2018-07-27 (×2): qty 850

## 2018-07-27 MED ORDER — BRIMONIDINE TARTRATE 0.2 % OP SOLN
1.0000 [drp] | Freq: Two times a day (BID) | OPHTHALMIC | Status: DC
  Administered 2018-07-27 – 2018-07-30 (×6): 1 [drp] via OPHTHALMIC
  Filled 2018-07-27: qty 5

## 2018-07-27 MED ORDER — ADULT MULTIVITAMIN W/MINERALS CH
1.0000 | ORAL_TABLET | Freq: Every day | ORAL | Status: DC
  Administered 2018-07-27 – 2018-07-30 (×4): 1 via ORAL
  Filled 2018-07-27 (×4): qty 1

## 2018-07-27 MED ORDER — FAMOTIDINE 20 MG PO TABS: 20.0000 mg | ORAL_TABLET | Freq: Two times a day (BID) | ORAL | Status: DC | PRN

## 2018-07-27 MED ORDER — SODIUM CHLORIDE 0.9% FLUSH
3.0000 mL | Freq: Two times a day (BID) | INTRAVENOUS | Status: DC
  Administered 2018-07-27 – 2018-07-30 (×7): 3 mL via INTRAVENOUS

## 2018-07-27 MED ORDER — SODIUM BICARBONATE 650 MG PO TABS
650.0000 mg | ORAL_TABLET | Freq: Three times a day (TID) | ORAL | Status: DC
  Administered 2018-07-27 – 2018-07-30 (×9): 650 mg via ORAL
  Filled 2018-07-27 (×9): qty 1

## 2018-07-27 MED ORDER — ASPIRIN 81 MG PO CHEW
81.0000 mg | CHEWABLE_TABLET | Freq: Every day | ORAL | Status: DC
  Administered 2018-07-27 – 2018-07-30 (×4): 81 mg via ORAL
  Filled 2018-07-27 (×4): qty 1

## 2018-07-27 MED ORDER — LATANOPROST 0.005 % OP SOLN
1.0000 [drp] | Freq: Every day | OPHTHALMIC | Status: DC
  Administered 2018-07-27 – 2018-07-29 (×3): 1 [drp] via OPHTHALMIC
  Filled 2018-07-27: qty 2.5

## 2018-07-27 MED ORDER — SODIUM CHLORIDE 0.9 % IV BOLUS
500.0000 mL | Freq: Once | INTRAVENOUS | Status: AC
  Administered 2018-07-27: 500 mL via INTRAVENOUS

## 2018-07-27 MED ORDER — STERILE WATER FOR INJECTION IV SOLN
INTRAVENOUS | Status: DC
  Filled 2018-07-27: qty 850

## 2018-07-27 MED ORDER — POTASSIUM CHLORIDE CRYS ER 20 MEQ PO TBCR
20.0000 meq | EXTENDED_RELEASE_TABLET | Freq: Once | ORAL | Status: AC
  Administered 2018-07-27: 20 meq via ORAL
  Filled 2018-07-27: qty 1

## 2018-07-27 MED ORDER — ATORVASTATIN CALCIUM 40 MG PO TABS
40.0000 mg | ORAL_TABLET | Freq: Every evening | ORAL | Status: DC
  Administered 2018-07-27 – 2018-07-29 (×3): 40 mg via ORAL
  Filled 2018-07-27 (×4): qty 1

## 2018-07-27 MED ORDER — HEPARIN SODIUM (PORCINE) 5000 UNIT/ML IJ SOLN
5000.0000 [IU] | Freq: Three times a day (TID) | INTRAMUSCULAR | Status: DC
  Administered 2018-07-27 – 2018-07-30 (×10): 5000 [IU] via SUBCUTANEOUS
  Filled 2018-07-27 (×9): qty 1

## 2018-07-27 MED ORDER — AMLODIPINE BESYLATE 10 MG PO TABS
10.0000 mg | ORAL_TABLET | Freq: Every day | ORAL | Status: DC
  Administered 2018-07-27 – 2018-07-30 (×4): 10 mg via ORAL
  Filled 2018-07-27 (×3): qty 1
  Filled 2018-07-27: qty 2

## 2018-07-27 MED ORDER — HYDRALAZINE HCL 10 MG PO TABS
10.0000 mg | ORAL_TABLET | Freq: Two times a day (BID) | ORAL | Status: DC
  Administered 2018-07-27 – 2018-07-28 (×3): 10 mg via ORAL
  Filled 2018-07-27 (×4): qty 1

## 2018-07-27 NOTE — ED Notes (Signed)
Pt stood up to use urinal - had large runny stool. Mumbling like he is confused. Washed himself off and RN and tech cleaned him and changed sheets. He is back in bed. He pulled IV out. Another will be started

## 2018-07-27 NOTE — ED Notes (Signed)
Helped get patient cleaned up back in bed patient is resting with call bell in reach

## 2018-07-27 NOTE — ED Notes (Signed)
The pt was brought back from c-t scan tech reports a few seconds of unresponsiveness while he was on the table   At present alert answering questions wife at the bedside.  No tongue damage

## 2018-07-27 NOTE — Progress Notes (Signed)
Attempted to obtain report RN busy with  Another patient and will call back.

## 2018-07-27 NOTE — Progress Notes (Signed)
  Echocardiogram 2D Echocardiogram has been performed.  Patrick [name] 07/27/2018, 2:54 PM

## 2018-07-27 NOTE — ED Notes (Signed)
Admitting at the bedside.  

## 2018-07-27 NOTE — ED Notes (Signed)
Pt standing for orthostatic VS. Stool noted on bed and pt. Pt cleaned.

## 2018-07-27 NOTE — ED Notes (Signed)
Patient transported to ECHO.

## 2018-07-27 NOTE — ED Notes (Signed)
Pt taken to xray 

## 2018-07-27 NOTE — ED Notes (Signed)
Voided 350cc

## 2018-07-27 NOTE — Progress Notes (Addendum)
Patient seen and examined  84 y.o. male with medical history significant of CKD stage 4 fistula in place but not on dialysis yet, PAH, HTN, prostate cancer presented to the ED with dizziness, possible syncope, CT head without contrast negative, chest x-ray no active cardiopulmonary disease, troponin negative 1, bicarbonate noted to be 13 likely secondary to see chronic kidney disease. Plan Will continue to monitor on telemetry DC bicarbonate infusion  , and bicarbonate tablets 2-D echo to rule out CHF, wall motion abnormalities Cannot rule out arrhythmia in the setting of low potassium, No focal findings to suggest stroke on exam No signs of infection, UA, chest x-ray negative Will check orthostatics, PT/OT, anticipate discharge tomorrow

## 2018-07-27 NOTE — H&P (Addendum)
History and Physical    Patrick [name]. LPF:[id number] DOB: 07-12-1932 DOA: 07/26/2018  PCP: Steele Sizer, MD  Patient coming from: Home  I have personally briefly reviewed patient's old medical records in [place]  Chief Complaint: Syncope  HPI: Patrick [name]. is a 84 y.o. male with medical history significant of CKD stage 4 fistula in place but not on dialysis yet, PAH, HTN, prostate cancer.  Patient presents to the ED for evaluation of syncope.  Patient reports that he woke up from sleep and thought it was morning.  He started to get ready for work.  He reports that he started to feel dizzy.  He is not sure what happened next, wife found him lying on the floor.  He believes that he passed out.  No injury.  Patient did not experience any chest pain or shortness of breath.  He reports that he wet himself when the episode occurred.  Second episode of syncope at home after EMS arrived that he doesn't remember either.   ED Course: 3rd episode of syncope while on CT scan table in ED.  Patient returning to being alert and answering questions after each episode.  Patient does readily admit that his memory isnt the greatest and so when I asked him questions about the Diamox he told me I should ask his wife.   Review of Systems: As per HPI otherwise 10 point review of systems negative.   Past Medical History:  Diagnosis Date  . Anemia   . Arthritis   . Bradycardia   . Chronic kidney disease   . GERD (gastroesophageal reflux disease)   . High cholesterol   . Hypertension   . IBS (irritable bowel syndrome)   . Pneumonia   . Prostate cancer ([place])   . Pulmonary HTN ([place]) 11/05/2015   88mHg on echo 2017  . PVD (peripheral vascular disease) ([facility]     Past Surgical History:  Procedure Laterality Date  . BASCILIC VEIN TRANSPOSITION Left 09/05/2016   Procedure: FIRST STAGE BASILIC VEIN TRANSPOSITION left arm;  Surgeon: VSerafina Mitchell MD;  [facility]   Service: Vascular;  Laterality: Left;  . BASCILIC VEIN TRANSPOSITION Left 11/14/2016   Procedure: BASCILIC VEIN TRANSPOSITION-LEFT ARM 2ND STAGE;  Surgeon: VSerafina Mitchell MD;  [facility]  Service: Vascular;  Laterality: Left;  . COLONOSCOPY    . PROSTATE SURGERY    . PROSTATECTOMY       reports that he quit smoking about 39 years ago. His smoking use included cigarettes. He has a 6.00 pack-year smoking history. He has never used smokeless tobacco. He reports that he does not drink alcohol or use drugs.  Allergies  Allergen Reactions  . Ace Inhibitors Cough    cough    Family History  Problem Relation Age of Onset  . CVA Mother   . Hypertension Mother   . Dementia Mother   . Heart attack Father   . Heart disease Father        before age 338 . Hypertension Father   . Hepatitis C Sister   . Dementia Sister      Prior to Admission medications   Medication Sig Start Date End Date Taking? Authorizing Provider  acetaZOLAMIDE (DIAMOX) 250 MG tablet Take 250 mg by mouth 4 (four) times daily. 07/06/18  Yes [provider]  amLODipine (NORVASC) 10 MG tablet Take 1 tablet (10 mg total) by mouth daily. 06/08/18  Yes SSteele Sizer MD  aspirin 81 MG tablet Take 81 mg by mouth daily.  10/05/09  Yes [provider]  atorvastatin (LIPITOR) 40 MG tablet Take 1 tablet (40 mg total) by mouth every evening. 11/05/17  Yes Sowles, Drue Stager, MD  brimonidine (ALPHAGAN) 0.2 % ophthalmic solution Place 1 drop into both eyes 2 (two) times daily. 10/24/17  Yes [provider]  dorzolamide-timolol (COSOPT) 22.3-6.8 MG/ML ophthalmic solution Place 1 drop into both eyes 2 (two) times daily.  03/13/17  Yes [provider]  famotidine (PEPCID) 20 MG tablet Take 1 tablet (20 mg total) by mouth 2 (two) times daily as needed for heartburn or indigestion. 07/17/18  Yes Sowles, Drue Stager, MD  hydrALAZINE (APRESOLINE) 10 MG tablet Take 1 tablet (10 mg total) by mouth 2 (two) times  daily. 06/08/18  Yes Sowles, Drue Stager, MD  latanoprost (XALATAN) 0.005 % ophthalmic solution Place 1 drop into both eyes at bedtime.  12/02/15  Yes [provider]  Multiple Vitamin (MULTIVITAMIN WITH MINERALS) TABS tablet Take 1 tablet by mouth daily.   Yes [provider]  vitamin C (ASCORBIC ACID) 500 MG tablet Take 500 mg by mouth daily.   Yes [provider]  Vitamin E 400 UNITS CHEW Chew 400 Units by mouth daily.    Yes [provider]    Physical Exam: Vitals:   07/27/18 0330 07/27/18 0400 07/27/18 0430 07/27/18 0500  BP: (!) 147/71 (!) 153/74 (!) 165/70 (!) 160/72  Pulse: 63 60 63 64  Resp: 13 12 12 12   Temp:      TempSrc:      SpO2: 98% 98% 98% 98%  Weight:      Height:        Constitutional: NAD, calm, comfortable Eyes: PERRL, lids and conjunctivae normal ENMT: Mucous membranes are moist. Posterior pharynx clear of any exudate or lesions.Normal dentition.  Neck: normal, supple, no masses, no thyromegaly Respiratory: clear to auscultation bilaterally, no wheezing, no crackles. Normal respiratory effort. No accessory muscle use.  Cardiovascular: Regular rate and rhythm, no murmurs / rubs / gallops. No extremity edema. 2+ pedal pulses. No carotid bruits. LUE fistula in place with thrill. Abdomen: no tenderness, no masses palpated. No hepatosplenomegaly. Bowel sounds positive.  Musculoskeletal: no clubbing / cyanosis. No joint deformity upper and lower extremities. Good ROM, no contractures. Normal muscle tone.  Skin: no rashes, lesions, ulcers. No induration Neurologic: CN 2-12 grossly intact. Sensation intact, DTR normal. Strength 5/5 in all 4.  Psychiatric: Alert and oriented, but definitely relies on wife to answer medical history questions.  Labs on Admission: I have personally reviewed following labs and imaging studies  CBC: Recent Labs  Lab 07/26/18 2355  WBC 4.5  HGB 11.0*  HCT 33.2*  MCV 96.2  PLT 662*   Basic Metabolic  Panel: Recent Labs  Lab 07/26/18 2355  NA 133*  K 3.3*  CL 116*  CO2 13*  GLUCOSE 144*  BUN 37*  CREATININE 2.29*  CALCIUM 9.2   GFR: Estimated Creatinine Clearance: 27.9 mL/min (A) (by C-G formula based on SCr of 2.29 mg/dL (H)). Liver Function Tests: No results for input(s): AST, ALT, ALKPHOS, BILITOT, PROT, ALBUMIN in the last 168 hours. No results for input(s): LIPASE, AMYLASE in the last 168 hours. No results for input(s): AMMONIA in the last 168 hours. Coagulation Profile: No results for input(s): INR, PROTIME in the last 168 hours. Cardiac Enzymes: Recent Labs  Lab 07/26/18 2355  TROPONINI <0.03   BNP (last 3 results) No results for input(s):  PROBNP in the last 8760 hours. HbA1C: No results for input(s): HGBA1C in the last 72 hours. CBG: No results for input(s): GLUCAP in the last 168 hours. Lipid Profile: No results for input(s): CHOL, HDL, LDLCALC, TRIG, CHOLHDL, LDLDIRECT in the last 72 hours. Thyroid Function Tests: No results for input(s): TSH, T4TOTAL, FREET4, T3FREE, THYROIDAB in the last 72 hours. Anemia Panel: No results for input(s): VITAMINB12, FOLATE, FERRITIN, TIBC, IRON, RETICCTPCT in the last 72 hours. Urine analysis:    Component Value Date/Time   COLORURINE YELLOW 07/27/2018 0401   APPEARANCEUR CLEAR 07/27/2018 0401   LABSPEC 1.014 07/27/2018 0401   PHURINE 5.0 07/27/2018 0401   GLUCOSEU NEGATIVE 07/27/2018 0401   HGBUR NEGATIVE 07/27/2018 0401   BILIRUBINUR NEGATIVE 07/27/2018 0401   KETONESUR NEGATIVE 07/27/2018 0401   PROTEINUR 100 (A) 07/27/2018 0401   NITRITE NEGATIVE 07/27/2018 0401   LEUKOCYTESUR NEGATIVE 07/27/2018 0401    Radiological Exams on Admission: Ct Head Wo Contrast  Result Date: 07/27/2018 CLINICAL DATA:  Altered consciousness. EXAM: CT HEAD WITHOUT CONTRAST TECHNIQUE: Contiguous axial images were obtained from the base of the skull through the vertex without intravenous contrast. COMPARISON:  Jan 16, 2017 FINDINGS:  Brain: No subdural, epidural, or subarachnoid hemorrhage noted. Cerebellum, brainstem, and basal cisterns noted. Ventricles and sulci are unchanged with no acute abnormality. No acute cortical ischemia infarct. No mass effect or midline shift. Vascular: Calcified atherosclerosis in the intracranial carotids. Skull: Normal. Negative for fracture or focal lesion. Sinuses/Orbits: No acute finding. Other: None. IMPRESSION: No acute intracranial abnormalities. Electronically Signed   By: Dorise Bullion III M.D   On: 07/27/2018 00:36    EKG: Independently reviewed.  Assessment/Plan Principal Problem:   Syncope Active Problems:   Benign essential HTN   Pulmonary hypertension (HCC)   Chronic kidney disease, stage IV (severe) (HCC)   Malignant neoplasm of prostate (HCC)   Metabolic acidosis, normal anion gap (NAG)   Dementia without behavioral disturbance (HCC)   Glaucoma    1. Syncope - 1. Syncope pathway 2. 2d echo 3. Tele monitor 4. Possible causes include but not limited to: NAG metabolic acidosis, PAH, other cardiogenic causes. 2. NAG metabolic acidosis - 1. Likely due to starting acetazolamide for treatment of glaucoma in patient with CKD stage 4. 2. Bicarb gtt at 75 3. BMP Q4H 4. Holding acetazolamide for the moment 5. But consult nephrology in AM (and possibly opthalmology) to see if they have any suggestions regarding treatment of Glaucoma in this patient. 3. Glaucoma - 1. Continue other treatments 2. See discussion of acetazolamide above 4. HTN - continue home BP meds 5. PAH - 1. Looks like they were discussion PDE5 in past months but doesn't look like this got started 2. 2d echo to see if this has worsened 6. CKD stage 4 - 1. Creat stable 2. Just the NAG met acidosis now as discussed above. 7. Dementia - 1. Ask wife for medical history, patient with at least moderate dementia. 8. Prostate CA - 1. PSA over 10 2. Urology wants to start him on hormone therapy next visit,  early December.  DVT prophylaxis: Heparin [place] Code Status: Full Family Communication: No family in room Disposition Plan: Home after admit Consults called: None, call nephro in AM Admission status: Place in obs    GARDNER, [place] [facility] Pager [phone] Only works nights!  If 7AM-7PM, please contact the primary day team physician taking care of patient  [email or web address] Password [email or web address]  07/27/2018, 5:14 AM

## 2018-07-27 NOTE — Progress Notes (Signed)
Patrick [name]. is a 84 y.o. male patient admitted from ED awake, alert - oriented  X 4 - no acute distress noted. IV in place, occlusive dsg intact without redness.  Orientation to room, and floor completed with information packet given to patient/family.  Patient declined safety video at this time.  Admission INP armband ID verified with patient/family, and in place.   SR up x 2, fall assessment complete, with patient and family able to verbalize understanding of risk associated with falls, and verbalized understanding to call nsg before up out of bed.  Call light within reach, patient able to voice, and demonstrate understanding.  Skin, clean-dry- intact without evidence of bruising, or skin tears.   No evidence of skin break down noted on exam.     Will cont to eval and treat per MD orders.  Tama High, RN 07/27/2018 3:46 PM

## 2018-07-27 NOTE — ED Provider Notes (Signed)
[facility] EMERGENCY DEPARTMENT Provider Note   CSN: [id number] Arrival date & time: 07/26/18  2348     History   Chief Complaint Chief Complaint  Patient presents with  . Near Syncope    HPI [facility] Patrick [name]. is a 84 y.o. male.  Patient presents to the emergency department for evaluation of syncope.  Patient reports that he woke up from sleep and thought it was morning.  He started to get ready for work.  He reports that he started to feel dizzy.  He is not sure what happened next, wife found him lying on the floor.  He believes that he passed out.  No injury.  Patient did not experience any chest pain or shortness of breath.  He reports that he wet himself when the episode occurred.   Wife reports that he had a second episode at home after EMS arrived. He does not remember either.      Past Medical History:  Diagnosis Date  . Anemia   . Arthritis   . Bradycardia   . Chronic kidney disease   . GERD (gastroesophageal reflux disease)   . High cholesterol   . Hypertension   . IBS (irritable bowel syndrome)   . Pneumonia   . Prostate cancer ([place])   . Pulmonary HTN ([place]) 11/05/2015   43mmHg on echo 2017  . PVD (peripheral vascular disease) [facility])     Patient Active Problem List   Diagnosis Date Noted  . Metabolic acidosis, normal anion gap (NAG) 07/27/2018  . Secondary hyperparathyroidism ([place]) 11/26/2016  . Mild cognitive impairment 01/03/2016  . Elevated prostate specific antigen (PSA) 09/08/2015  . Bone pain 06/13/2015  . Spinal stenosis 06/13/2015  . OP (osteoporosis) 03/20/2015  . Allergic rhinitis 03/20/2015  . Lumbar canal stenosis 03/20/2015  . Vitamin D deficiency 03/20/2015  . Restless legs syndrome 03/20/2015  . Drug-induced gynecomastia 03/20/2015  . Diaphragmatic hernia 03/20/2015  . Acquired cyst of kidney 03/20/2015  . Left ventricular hypertrophy by electrocardiogram 03/20/2015  . Leg pain 03/20/2015  . Chronic kidney  disease, stage IV (severe) ([place]) 03/20/2015  . History of pneumonia 03/20/2015  . Non-rheumatic tricuspid valve insufficiency 03/20/2015  . Dysmetabolic syndrome 45/11/8880  . Pulmonary hypertension ([place]) 10/31/2014  . Benign essential HTN 07/29/2014  . Dyslipidemia 07/29/2014  . PVD (peripheral vascular disease) ([place])   . Anemia of chronic disease   . IBS (irritable bowel syndrome)   . Bradycardia   . ED (erectile dysfunction) of organic origin 06/29/2012  . Genuine stress incontinence, male 06/29/2012  . Malignant neoplasm of prostate ([place]) 06/29/2012    Past Surgical History:  Procedure Laterality Date  . BASCILIC VEIN TRANSPOSITION Left 09/05/2016   Procedure: FIRST STAGE BASILIC VEIN TRANSPOSITION left arm;  Surgeon: Serafina Mitchell, MD;  Location: [facility];  Service: Vascular;  Laterality: Left;  . BASCILIC VEIN TRANSPOSITION Left 11/14/2016   Procedure: BASCILIC VEIN TRANSPOSITION-LEFT ARM 2ND STAGE;  Surgeon: Serafina Mitchell, MD;  Location: [facility] [facility];  Service: Vascular;  Laterality: Left;  . COLONOSCOPY    . PROSTATE SURGERY    . PROSTATECTOMY          Home Medications    Prior to Admission medications   Medication Sig Start Date End Date Taking? Authorizing Provider  acetaZOLAMIDE (DIAMOX) 250 MG tablet Take 250 mg by mouth 4 (four) times daily. 07/06/18  Yes [provider]  amLODipine (NORVASC) 10 MG tablet Take 1 tablet (10 mg total) by mouth  daily. 06/08/18  Yes Sowles, Drue Stager, MD  aspirin 81 MG tablet Take 81 mg by mouth daily.  10/05/09  Yes [provider]  atorvastatin (LIPITOR) 40 MG tablet Take 1 tablet (40 mg total) by mouth every evening. 11/05/17  Yes Sowles, Drue Stager, MD  brimonidine (ALPHAGAN) 0.2 % ophthalmic solution Place 1 drop into both eyes 2 (two) times daily. 10/24/17  Yes [provider]  dorzolamide-timolol (COSOPT) 22.3-6.8 MG/ML ophthalmic solution Place 1 drop into both eyes 2 (two) times daily.  03/13/17  Yes [provider]  famotidine (PEPCID) 20 MG tablet Take 1 tablet (20 mg total) by mouth 2 (two) times daily as needed for heartburn or indigestion. 07/17/18  Yes Sowles, Drue Stager, MD  hydrALAZINE (APRESOLINE) 10 MG tablet Take 1 tablet (10 mg total) by mouth 2 (two) times daily. 06/08/18  Yes Sowles, Drue Stager, MD  latanoprost (XALATAN) 0.005 % ophthalmic solution Place 1 drop into both eyes at bedtime.  12/02/15  Yes [provider]  Multiple Vitamin (MULTIVITAMIN WITH MINERALS) TABS tablet Take 1 tablet by mouth daily.   Yes [provider]  vitamin C (ASCORBIC ACID) 500 MG tablet Take 500 mg by mouth daily.   Yes [provider]  Vitamin E 400 UNITS CHEW Chew 400 Units by mouth daily.    Yes [provider]  Cholecalciferol (VITAMIN D) 2000 units CAPS Take 1 capsule (2,000 Units total) by mouth daily. Patient not taking: Reported on 07/27/2018 03/04/18   Steele Sizer, MD  olmesartan (BENICAR) 40 MG tablet Take 1 tablet (40 mg total) by mouth daily. Patient not taking: Reported on 07/27/2018 06/08/18   Steele Sizer, MD    Family History Family History  Problem Relation Age of Onset  . CVA Mother   . Hypertension Mother   . Dementia Mother   . Heart attack Father   . Heart disease Father        before age 80  . Hypertension Father   . Hepatitis C Sister   . Dementia Sister     Social History Social History   Tobacco Use  . Smoking status: Former Smoker    Packs/day: 0.30    Years: 20.00    Pack years: 6.00    Types: Cigarettes    Last attempt to quit: 08/26/1978    Years since quitting: 39.9  . Smokeless tobacco: Never Used  . Tobacco comment: smoking cessation materials not required  Substance Use Topics  . Alcohol use: No    Alcohol/week: 0.0 standard drinks  . Drug use: No     Allergies   Ace inhibitors   Review of Systems Review of Systems  Neurological: Positive for syncope.  Psychiatric/Behavioral: Positive for confusion.    All other systems reviewed and are negative.    Physical Exam Updated Vital Signs BP (!) 147/71   Pulse 63   Temp 98.2 F (36.8 C) (Oral)   Resp 13   Ht 6\' 2"  (1.88 m)   Wt 83 kg   SpO2 98%   BMI 23.49 kg/m   Physical Exam  Constitutional: He is oriented to person, place, and time. He appears well-developed and well-nourished. No distress.  HENT:  Head: Normocephalic and atraumatic.  Right Ear: Hearing normal.  Left Ear: Hearing normal.  Nose: Nose normal.  Mouth/Throat: Oropharynx is clear and moist and mucous membranes are normal.  Eyes: Pupils are equal, round, and reactive to light. Conjunctivae and EOM are normal.  Neck: Normal range of motion. Neck  supple.  Cardiovascular: Regular rhythm, S1 normal and S2 normal. Exam reveals no gallop and no friction rub.  No murmur heard. Pulmonary/Chest: Effort normal and breath sounds normal. No respiratory distress. He exhibits no tenderness.  Abdominal: Soft. Normal appearance and bowel sounds are normal. There is no hepatosplenomegaly. There is no tenderness. There is no rebound, no guarding, no tenderness at McBurney's point and negative Murphy's sign. No hernia.  Musculoskeletal: Normal range of motion.  Neurological: He is alert and oriented to person, place, and time. He has normal strength. No cranial nerve deficit or sensory deficit. Coordination normal. GCS eye subscore is 4. GCS verbal subscore is 5. GCS motor subscore is 6.  Skin: Skin is warm, dry and intact. No rash noted. No cyanosis.  Psychiatric: He has a normal mood and affect. His speech is normal and behavior is normal. Thought content normal.  Nursing note and vitals reviewed.    ED Treatments / Results  Labs (all labs ordered are listed, but only abnormal results are displayed) Labs Reviewed  BASIC METABOLIC PANEL - Abnormal; Notable for the following components:      Result Value   Sodium 133 (*)    Potassium 3.3 (*)    Chloride 116 (*)    CO2 13 (*)     Glucose, Bld 144 (*)    BUN 37 (*)    Creatinine, Ser 2.29 (*)    GFR calc non Af Amer 25 (*)    GFR calc Af Amer 28 (*)    Anion gap 4 (*)    All other components within normal limits  CBC - Abnormal; Notable for the following components:   RBC 3.45 (*)    Hemoglobin 11.0 (*)    HCT 33.2 (*)    Platelets 149 (*)    All other components within normal limits  URINALYSIS, ROUTINE W REFLEX MICROSCOPIC - Abnormal; Notable for the following components:   Protein, ur 100 (*)    All other components within normal limits  TROPONIN I  BASIC METABOLIC PANEL  BASIC METABOLIC PANEL  BASIC METABOLIC PANEL    EKG EKG Interpretation  Date/Time:  Sunday July 26 2018 23:52:41 EST Ventricular Rate:  61 PR Interval:    QRS Duration: 124 QT Interval:  430 QTC Calculation: 434 R Axis:   49 Text Interpretation:  Sinus rhythm Borderline prolonged PR interval IVCD, consider atypical RBBB Baseline wander in lead(s) V2 V6 No significant change since last tracing Confirmed by Orpah Greek [id number]) on 07/27/2018 12:46:45 AM   Radiology Ct Head Wo Contrast  Result Date: 07/27/2018 CLINICAL DATA:  Altered consciousness. EXAM: CT HEAD WITHOUT CONTRAST TECHNIQUE: Contiguous axial images were obtained from the base of the skull through the vertex without intravenous contrast. COMPARISON:  Jan 16, 2017 FINDINGS: Brain: No subdural, epidural, or subarachnoid hemorrhage noted. Cerebellum, brainstem, and basal cisterns noted. Ventricles and sulci are unchanged with no acute abnormality. No acute cortical ischemia infarct. No mass effect or midline shift. Vascular: Calcified atherosclerosis in the intracranial carotids. Skull: Normal. Negative for fracture or focal lesion. Sinuses/Orbits: No acute finding. Other: None. IMPRESSION: No acute intracranial abnormalities. Electronically Signed   By: Dorise Bullion III M.D   On: 07/27/2018 00:36    Procedures Procedures (including critical care  time)  Medications Ordered in ED Medications  sodium chloride 0.9 % bolus 500 mL (500 mLs Intravenous New Bag/Given 07/27/18 0355)  sodium bicarbonate 150 mEq in sterile water 1,000 mL infusion (has no administration in time  range)     Initial Impression / Assessment and Plan / ED Course  I have reviewed the triage vital signs and the nursing notes.  Pertinent labs & imaging results that were available during my care of the patient were reviewed by me and considered in my medical decision making (see chart for details).     Patient had two syncopal episodes at home. He awoke confused, attempted to get ready for work because he thought it was morning. He remembers getting dizzy before first episode, doesn't remember passing out either time. He had a third episode here in the ER during workup. No arrhythmia noted. Workup unrevealing other than borderline orthostasis. Administered IVF, will recommend observation.   Final Clinical Impressions(s) / ED Diagnoses   Final diagnoses:  Syncope, unspecified syncope type    ED Discharge Orders    None       Orpah Greek, MD 07/27/18 [phone]

## 2018-07-28 ENCOUNTER — Observation Stay (HOSPITAL_COMMUNITY): Payer: Medicare HMO

## 2018-07-28 ENCOUNTER — Encounter (HOSPITAL_COMMUNITY): Payer: Self-pay | Admitting: General Practice

## 2018-07-28 DIAGNOSIS — R42 Dizziness and giddiness: Secondary | ICD-10-CM | POA: Diagnosis not present

## 2018-07-28 DIAGNOSIS — R4182 Altered mental status, unspecified: Secondary | ICD-10-CM | POA: Diagnosis not present

## 2018-07-28 DIAGNOSIS — R55 Syncope and collapse: Secondary | ICD-10-CM | POA: Diagnosis not present

## 2018-07-28 LAB — VITAMIN B12: Vitamin B-12: 452 pg/mL (ref 180–914)

## 2018-07-28 LAB — COMPREHENSIVE METABOLIC PANEL
ALBUMIN: 3.6 g/dL (ref 3.5–5.0)
ALT: 14 U/L (ref 0–44)
ANION GAP: 7 (ref 5–15)
AST: 24 U/L (ref 15–41)
Alkaline Phosphatase: 54 U/L (ref 38–126)
BUN: 30 mg/dL — AB (ref 8–23)
CHLORIDE: 118 mmol/L — AB (ref 98–111)
CO2: 14 mmol/L — AB (ref 22–32)
Calcium: 9.8 mg/dL (ref 8.9–10.3)
Creatinine, Ser: 2.03 mg/dL — ABNORMAL HIGH (ref 0.61–1.24)
GFR calc Af Amer: 33 mL/min — ABNORMAL LOW (ref >60)
GFR calc non Af Amer: 28 mL/min — ABNORMAL LOW (ref >60)
GLUCOSE: 102 mg/dL — AB (ref 70–99)
POTASSIUM: 3.5 mmol/L (ref 3.5–5.1)
SODIUM: 139 mmol/L (ref 135–145)
TOTAL PROTEIN: 7 g/dL (ref 6.5–8.1)
Total Bilirubin: 1 mg/dL (ref 0.3–1.2)

## 2018-07-28 LAB — BLOOD GAS, ARTERIAL
Acid-base deficit: 9.2 mmol/L — ABNORMAL HIGH (ref 0.0–2.0)
Bicarbonate: 15.5 mmol/L — ABNORMAL LOW (ref 20.0–28.0)
DRAWN BY: 511911
FIO2: 21
O2 SAT: 98.1 %
PATIENT TEMPERATURE: 98.6
PO2 ART: 104 mmHg (ref 83.0–108.0)
pCO2 arterial: 29.7 mmHg — ABNORMAL LOW (ref 32.0–48.0)
pH, Arterial: 7.337 — ABNORMAL LOW (ref 7.350–7.450)

## 2018-07-28 LAB — TSH: TSH: 3.194 u[IU]/mL (ref 0.350–4.500)

## 2018-07-28 LAB — GLUCOSE, CAPILLARY: Glucose-Capillary: 90 mg/dL (ref 70–99)

## 2018-07-28 MED ORDER — HYDRALAZINE HCL 20 MG/ML IJ SOLN
10.0000 mg | Freq: Four times a day (QID) | INTRAMUSCULAR | Status: DC | PRN
  Administered 2018-07-28 – 2018-07-29 (×3): 10 mg via INTRAVENOUS
  Filled 2018-07-28 (×2): qty 1

## 2018-07-28 MED ORDER — HYDRALAZINE HCL 10 MG PO TABS
10.0000 mg | ORAL_TABLET | Freq: Three times a day (TID) | ORAL | Status: DC
  Administered 2018-07-28 – 2018-07-30 (×5): 10 mg via ORAL
  Filled 2018-07-28 (×5): qty 1

## 2018-07-28 NOTE — Plan of Care (Signed)

## 2018-07-28 NOTE — Evaluation (Signed)
Physical Therapy Evaluation Patient Details Name: Patrick [name]. MRN: [id number] DOB: Sep 16, 1931 Today's Date: 07/28/2018   History of Present Illness  84 y.o.malewith medical history significant ofCKD stage 4 fistula in place but not on dialysis yet, PAH, HTN, prostate cancerpresented to the ED with dizziness and syncope. MRI negative.  Clinical Impression  Pt admitted with/for syncope.  Pt presently is confused and needing min guard to min assist for gait.   Pt currently limited functionally due to the problems listed below.  (see problems list.)  Pt will benefit from PT to maximize function and safety to be able to get home safely with available assist.     Follow Up Recommendations Home health PT;Supervision/Assistance - 24 hour    Equipment Recommendations  Other (comment)(TBA)    Recommendations for Other Services       Precautions / Restrictions Precautions Precautions: Fall      Mobility  Bed Mobility Overal bed mobility: Needs Assistance Bed Mobility: Supine to Sit;Sit to Supine     Supine to sit: Supervision Sit to supine: Supervision   General bed mobility comments: supervision for safety  Transfers Overall transfer level: Needs assistance Equipment used: None Transfers: Sit to/from Stand Sit to Stand: Min guard;Min assist         General transfer comment: Min A for safety during sit>stand. Min guard A for safety in standing and during descent  Ambulation/Gait Ambulation/Gait assistance: Min guard;Min assist Gait Distance (Feet): 180 Feet Assistive device: None Gait Pattern/deviations: Step-through pattern Gait velocity: slower Gait velocity interpretation: <1.8 ft/sec, indicate of risk for recurrent falls General Gait Details: Periods of unsteadiness with drift. but generally steady with no AD.  (pt with squatting maneuvers as if spying around corners.)  Stairs            Wheelchair Mobility    Modified Rankin (Stroke Patients  Only)       Balance Overall balance assessment: Mild deficits observed, not formally tested                                           Pertinent Vitals/Pain Pain Assessment: No/denies pain    Home Living Family/patient expects to be discharged to:: Private residence Living Arrangements: Spouse/significant other Available Help at Discharge: Family Type of Home: House Home Access: Stairs to enter   Technical brewer of Steps: 3 Home Layout: Two level;Able to live on main level with bedroom/bathroom Home Equipment: Shower seat      Prior Function Level of Independence: Independent         Comments: ADLs, IADLs, and works four hours a day     Journalist, newspaper        Extremity/Trunk Assessment   Upper Extremity Assessment Upper Extremity Assessment: Overall WFL for tasks assessed    Lower Extremity Assessment Lower Extremity Assessment: Overall WFL for tasks assessed(mild proximal weakness, but functional)       Communication   Communication: No difficulties  Cognition Arousal/Alertness: Awake/alert Behavior During Therapy: WFL for tasks assessed/performed;Impulsive Overall Cognitive Status: Impaired/Different from baseline Area of Impairment: Orientation;Attention;Memory;Following commands;Safety/judgement;Awareness;Problem solving                 Orientation Level: Disoriented to;Time;Situation;Place Current Attention Level: Selective Memory: Decreased short-term memory Following Commands: Follows one step commands with increased time Safety/Judgement: Decreased awareness of safety;Decreased awareness of deficits Awareness: Emergent Problem Solving: Slow processing;Difficulty sequencing;Requires  verbal cues;Requires tactile cues General Comments: Pt's wife reporting he is not at baselien cognition saying "oh, he is definitely off." Pt not oriented to place, time and situation. Pt very pleasant and enjoyed joking with therapist;  laughing a lot. Pt with poor safety awareness, attnetion, and problem solving. Required increased time and cues throughout session.       General Comments General comments (skin integrity, edema, etc.): Wife present at the begining of session and providing PLOF    Exercises     Assessment/Plan    PT Assessment Patient needs continued PT services  PT Problem List Decreased strength;Decreased activity tolerance;Decreased balance;Decreased mobility;Decreased safety awareness       PT Treatment Interventions Gait training;Stair training;Functional mobility training;Therapeutic activities;Balance training;Patient/family education    PT Goals (Current goals can be found in the Care Plan section)  Acute Rehab PT Goals Patient Stated Goal: "Go home" PT Goal Formulation: With patient Time For Goal Achievement: 08/04/18 Potential to Achieve Goals: Good    Frequency Min 3X/week   Barriers to discharge        Co-evaluation PT/OT/SLP Co-Evaluation/Treatment: Yes Reason for Co-Treatment: Complexity of the patient's impairments (multi-system involvement);Necessary to address cognition/behavior during functional activity PT goals addressed during session: Mobility/safety with mobility OT goals addressed during session: ADL's and self-care       AM-PAC PT "6 Clicks" Daily Activity  Outcome Measure Difficulty turning over in bed (including adjusting bedclothes, sheets and blankets)?: A Little Difficulty moving from lying on back to sitting on the side of the bed? : A Little Difficulty sitting down on and standing up from a chair with arms (e.g., wheelchair, bedside commode, etc,.)?: A Little Help needed moving to and from a bed to chair (including a wheelchair)?: A Little Help needed walking in hospital room?: A Little Help needed climbing 3-5 steps with a railing? : A Little 6 Click Score: 18    End of Session   Activity Tolerance: Patient tolerated treatment well Patient left: in  bed;with call bell/phone within reach;with bed alarm set Nurse Communication: Mobility status PT Visit Diagnosis: Unsteadiness on feet (R26.81);Other symptoms and signs involving the nervous system (R29.898);Other abnormalities of gait and mobility (R26.89)    Time: 8588-5027 PT Time Calculation (min) (ACUTE ONLY): 24 min   Charges:   PT Evaluation $PT Eval Moderate Complexity: 1 Mod          07/28/2018  Donnella Sham, PT Acute Rehabilitation Services [phone]  (pager) [phone]  (office)  Tessie Fass Elliana Bal 07/28/2018, 5:40 PM

## 2018-07-28 NOTE — Evaluation (Addendum)
Occupational Therapy Evaluation Patient Details Name: Patrick [name]. MRN: [id number] DOB: 03-Jul-1932 Today's Date: 07/28/2018    History of Present Illness 84 y.o.malewith medical history significant ofCKD stage 4 fistula in place but not on dialysis yet, PAH, HTN, prostate cancerpresented to the ED with dizziness and syncope. MRI negative.    Clinical Impression   PTA, pt was living with his wife and was independent and working part time (four hours). Pt currently requiring Min Guard A for ADLs and functional mobility for safety. Pt presenting with impulsivity and poor cognition as seen by decreased safety awareness, attention, problem solving, and orientation (only oriented to self). Pt requiring increased time and Max cues throughout session. Pt would benefit from further acute OT to facilitate safe dc. Recommend dc to home with HHOT for further OT to optimize safety, independence with ADLs, and return to PLOF.       Follow Up Recommendations  Home health OT;Supervision/Assistance - 24 hour    Equipment Recommendations  None recommended by OT    Recommendations for Other Services PT consult     Precautions / Restrictions Precautions Precautions: Fall      Mobility Bed Mobility Overal bed mobility: Needs Assistance Bed Mobility: Supine to Sit;Sit to Supine     Supine to sit: Supervision Sit to supine: Supervision   General bed mobility comments: supervision for safety  Transfers Overall transfer level: Needs assistance Equipment used: None Transfers: Sit to/from Stand Sit to Stand: Min guard;Min assist         General transfer comment: Min A for safety during sit>stand. Min guard A for safety in standing and during descent    Balance Overall balance assessment: Mild deficits observed, not formally tested                                         ADL either performed or assessed with clinical judgement   ADL Overall ADL's : Needs  assistance/impaired Eating/Feeding: Independent;Sitting   Grooming: Set up;Supervision/safety;Standing   Upper Body Bathing: Supervision/ safety;Set up;Sitting   Lower Body Bathing: Sit to/from stand;Min guard   Upper Body Dressing : Minimal assistance;Sitting Upper Body Dressing Details (indicate cue type and reason): Donning second gown like jacket. Min A for managing second sleeve and requiring increased cues to locate second sleeve Lower Body Dressing: Min guard;Sit to/from stand Lower Body Dressing Details (indicate cue type and reason): Min Guard A for safety Toilet Transfer: Min guard;Ambulation(simulated in room)           Functional mobility during ADLs: Min guard(close Min Guard for safety due to impulsivity) General ADL Comments: Pt with decreased cognition and safety awareness. Requiring Min Guard during OOB activity to maintain safety.      Vision Baseline Vision/History: Wears glasses Wears Glasses: At all times;Reading only Patient Visual Report: No change from baseline       Perception     Praxis      Pertinent Vitals/Pain Pain Assessment: No/denies pain     Hand Dominance     Extremity/Trunk Assessment Upper Extremity Assessment Upper Extremity Assessment: Overall WFL for tasks assessed   Lower Extremity Assessment Lower Extremity Assessment: Defer to PT evaluation       Communication Communication Communication: No difficulties   Cognition Arousal/Alertness: Awake/alert Behavior During Therapy: WFL for tasks assessed/performed;Impulsive Overall Cognitive Status: Impaired/Different from baseline Area of Impairment: Orientation;Attention;Memory;Following commands;Safety/judgement;Awareness;Problem solving  Orientation Level: Disoriented to;Time;Situation;Place Current Attention Level: Selective Memory: Decreased short-term memory Following Commands: Follows one step commands with increased time Safety/Judgement:  Decreased awareness of safety;Decreased awareness of deficits Awareness: Emergent Problem Solving: Slow processing;Difficulty sequencing;Requires verbal cues;Requires tactile cues General Comments: Pt's wife reporting he is not at baselien cognition saying "oh, he is definitely off." Pt not oriented to place, time and situation. Pt very pleasant and enjoyed joking with therapist; laughing a lot. Pt with poor safety awareness, attnetion, and problem solving. Required increased time and cues throughout session.    General Comments  Wife present at the begining of session and providing PLOF    Exercises     Shoulder Instructions      Home Living Family/patient expects to be discharged to:: Private residence Living Arrangements: Spouse/significant other Available Help at Discharge: Family Type of Home: House Home Access: Stairs to enter [employer] of Steps: [place]: Two level;Able to live on main level with bedroom/bathroom Alternate Level Stairs-Number of Steps: flight   Bathroom Shower/Tub: Occupational psychologist: Handicapped height     Home Equipment: Shower seat          Prior Functioning/Environment Level of Independence: Independent        Comments: ADLs, IADLs, and works four hours a day        OT Problem List: Decreased activity tolerance;Impaired balance (sitting and/or standing);Decreased safety awareness;Decreased knowledge of use of DME or AE;Decreased knowledge of precautions;Decreased cognition      OT Treatment/Interventions: Self-care/ADL training;Therapeutic exercise;Energy conservation;DME and/or AE instruction;Therapeutic activities;Patient/family education    OT Goals(Current goals can be found in the care plan section) Acute Rehab OT Goals Patient Stated Goal: "Go home" OT Goal Formulation: With patient Time For Goal Achievement: 08/11/18 Potential to Achieve Goals: Good  OT Frequency: Min 2X/week   Barriers to D/C:             Co-evaluation PT/OT/SLP Co-Evaluation/Treatment: Yes Reason for Co-Treatment: Necessary to address cognition/behavior during functional activity   OT goals addressed during session: ADL's and self-care      AM-PAC PT "6 Clicks" Daily Activity     Outcome Measure Help from another person eating meals?: None Help from another person taking care of personal grooming?: A Little Help from another person toileting, which includes using toliet, bedpan, or urinal?: A Little Help from another person bathing (including washing, rinsing, drying)?: A Little Help from another person to put on and taking off regular upper body clothing?: A Little Help from another person to put on and taking off regular lower body clothing?: A Little 6 Click Score: 19   End of Session Nurse Communication: Mobility status  Activity Tolerance: Patient tolerated treatment well Patient left: in bed;with call bell/phone within reach;with bed alarm set;with nursing/sitter in room  OT Visit Diagnosis: Unsteadiness on feet (R26.81);Other abnormalities of gait and mobility (R26.89);Muscle weakness (generalized) (M62.81);Other symptoms and signs involving cognitive function                Time: 7741-2878 OT Time Calculation (min): 24 min Charges:  OT General Charges $OT Visit: 1 Visit OT Evaluation $OT Eval Moderate Complexity: 1 Mod  Ariana Juul MSOT, OTR/L Acute Rehab Pager: [phone] Office: [place] 07/28/2018, 5:09 PM

## 2018-07-28 NOTE — Progress Notes (Addendum)
[facility] PROGRESS NOTE  Patrick [name]. SJG:[id number] DOB: 1932-03-19 DOA: 07/26/2018   PCP: Steele Sizer, MD     Assessment/Plan: Principal Problem:   Syncope Active Problems:   Benign essential HTN   Pulmonary hypertension (HCC)   Chronic kidney disease, stage IV (severe) (HCC)   Malignant neoplasm of prostate (HCC)   Metabolic acidosis, normal anion gap (NAG)   Dementia without behavioral disturbance ([place])   Glaucoma   84 y.o.malewith medical history significant ofCKD stage 4 fistula in place but not on dialysis yet, PAH, HTN, prostate cancer presented to the ED with dizziness, possible syncope ,, according to the wife he passed out twice, CT head without contrast negative, chest x-ray no active cardiopulmonary disease, troponin negative 1, bicarbonate noted to be 13 likely secondary to see chronic kidney disease.. Patient improved  Just with observation overnight.EKG shows right bundle branch block. No evidence of arrhythmias on telemetry overnight, remained in sinus rhythm  Assessment and plan Syncope No evidence on telemetry 2-D echo showed EF of 65-70%, LVH, grade 1 diastolic dysfunction, CT of the head without any acute intracranial abnormalities MRI of the brain ordered and pending to rule out acute CVA in the setting of hypertension and uncontrolled blood pressure  Hypertension-patient takes very low dose hydralazine and amlodipine Wife stated that the patient's PCP recently lowered his blood pressure medications and now he is noted to have rebound hypertension Will increase hydralazine to 10 mg 3 times a day and observe  Non-anion gap metabolic acidosis Recently started on acetazolamide for glaucoma by his ophthalmologist I also feel that the patient's CKD is probably the major contributing to his metabolic acidosis Acetazolamide discontinued and the patient has been started sodium bicarbonate tablets ABG has improved pH 7.235>  7.337  pulmonary hypertension-mild on 2-D echo, currently on no treatment  CK D stage IV with nonionic gap metabolic acidosis Renal function stable  Mild dementia Probable acute metabolic encephalopathy Wife concerned about patient not being at his baseline, therefore MRI of the brain ordered to rule out an acute CVA Check vitamin B-12 and TSH  History of prostate cancer Being followed by urology as outpatient  DVT prophylaxsis heparin  Code Status:  Full code      Family Communication: Discussed in detail with the patient, all imaging results, lab results explained to the patient   Disposition Plan:  PT OT evaluation pending after MRI, anticipate discharge tomorrow, currently unable to justify inpatient status or remain observation      Consultants:  none  Procedures:  none  Antibiotics: Anti-infectives (From admission, onward)   None         HPI/Subjective:  does not appear to be confused on bedside evaluation however wife feels that the patient is still not back to baseline patient has not had any syncopal episodes since admission, no arrhythmias noted  Objective: Vitals:   07/28/18 0414 07/28/18 0610 07/28/18 0800 07/28/18 1227  BP: (!) 181/96 (!) 165/74 (!) 154/68 (!) 167/92  Pulse: 65 67 66 77  Resp: 12  18 20   Temp: 98.8 F (37.1 C)  98.7 F (37.1 C)   TempSrc: Oral  Oral   SpO2: 100%  98% 97%  Weight:  82 kg    Height:        Intake/Output Summary (Last 24 hours) at 07/28/2018 1412 Last data filed at 07/28/2018 0100 Gross per 24 hour  Intake 120 ml  Output -  Net 120 ml  Exam:  Examination:  General exam: Appears calm and comfortable  Respiratory system: Clear to auscultation. Respiratory effort normal. Cardiovascular system: S1 & S2 heard, RRR. No JVD, murmurs, rubs, gallops or clicks. No pedal edema. Gastrointestinal system: Abdomen is nondistended, soft and nontender. No organomegaly or masses felt. Normal bowel sounds  heard. Central nervous system: Alert and oriented self and place. No focal neurological deficits. Extremities: Symmetric 5 x 5 power. Skin: No rashes, lesions or ulcers Psychiatry: Judgement and insight appear normal. Mood & affect appropriate.     Data Reviewed: I have personally reviewed following labs and imaging studies  Micro Results No results found for this or any previous visit (from the past 240 hour(s)).  Radiology Reports X-ray Chest Pa And Lateral  Result Date: 07/27/2018 CLINICAL DATA:  Cough.  Syncope. EXAM: CHEST - 2 VIEW COMPARISON:  07/25/2015 FINDINGS: Normal heart size and pulmonary vascularity. No focal airspace disease or consolidation in the lungs. No blunting of costophrenic angles. No pneumothorax. Mediastinal contours appear intact. Degenerative changes in the spine. IMPRESSION: No active cardiopulmonary disease. Electronically Signed   By: Lucienne Capers M.D.   On: 07/27/2018 05:51   Ct Head Wo Contrast  Result Date: 07/27/2018 CLINICAL DATA:  Altered consciousness. EXAM: CT HEAD WITHOUT CONTRAST TECHNIQUE: Contiguous axial images were obtained from the base of the skull through the vertex without intravenous contrast. COMPARISON:  Jan 16, 2017 FINDINGS: Brain: No subdural, epidural, or subarachnoid hemorrhage noted. Cerebellum, brainstem, and basal cisterns noted. Ventricles and sulci are unchanged with no acute abnormality. No acute cortical ischemia infarct. No mass effect or midline shift. Vascular: Calcified atherosclerosis in the intracranial carotids. Skull: Normal. Negative for fracture or focal lesion. Sinuses/Orbits: No acute finding. Other: None. IMPRESSION: No acute intracranial abnormalities. Electronically Signed   By: Dorise Bullion III M.D   On: 07/27/2018 00:36     CBC Recent Labs  Lab 07/26/18 2355  WBC 4.5  HGB 11.0*  HCT 33.2*  PLT 149*  MCV 96.2  MCH 31.9  MCHC 33.1  RDW 14.1    Chemistries  Recent Labs  Lab 07/26/18 2355  07/27/18 0531 07/28/18 0439  NA 133* 140 139  K 3.3* 4.0 3.5  CL 116* 121* 118*  CO2 13* 13* 14*  GLUCOSE 144* 111* 102*  BUN 37* 35* 30*  CREATININE 2.29* 2.15* 2.03*  CALCIUM 9.2 9.1 9.8  MG  --  2.2  --   AST  --   --  24  ALT  --   --  14  ALKPHOS  --   --  54  BILITOT  --   --  1.0   ------------------------------------------------------------------------------------------------------------------ estimated creatinine clearance is 31.4 mL/min (A) (by C-G formula based on SCr of 2.03 mg/dL (H)). ------------------------------------------------------------------------------------------------------------------ No results for input(s): HGBA1C in the last 72 hours. ------------------------------------------------------------------------------------------------------------------ No results for input(s): CHOL, HDL, LDLCALC, TRIG, CHOLHDL, LDLDIRECT in the last 72 hours. ------------------------------------------------------------------------------------------------------------------ No results for input(s): TSH, T4TOTAL, T3FREE, THYROIDAB in the last 72 hours.  Invalid input(s): FREET3 ------------------------------------------------------------------------------------------------------------------ No results for input(s): VITAMINB12, FOLATE, FERRITIN, TIBC, IRON, RETICCTPCT in the last 72 hours.  Coagulation profile No results for input(s): INR, PROTIME in the last 168 hours.  No results for input(s): DDIMER in the last 72 hours.  Cardiac Enzymes Recent Labs  Lab 07/26/18 2355 07/27/18 1224  TROPONINI <0.03 0.04*   ------------------------------------------------------------------------------------------------------------------ Invalid input(s): POCBNP   CBG: Recent Labs  Lab 07/28/18 0749  GLUCAP 90       Studies: X-ray  Chest Pa And Lateral  Result Date: 07/27/2018 CLINICAL DATA:  Cough.  Syncope. EXAM: CHEST - 2 VIEW COMPARISON:  07/25/2015 FINDINGS: Normal  heart size and pulmonary vascularity. No focal airspace disease or consolidation in the lungs. No blunting of costophrenic angles. No pneumothorax. Mediastinal contours appear intact. Degenerative changes in the spine. IMPRESSION: No active cardiopulmonary disease. Electronically Signed   By: Lucienne Capers M.D.   On: 07/27/2018 05:51   Ct Head Wo Contrast  Result Date: 07/27/2018 CLINICAL DATA:  Altered consciousness. EXAM: CT HEAD WITHOUT CONTRAST TECHNIQUE: Contiguous axial images were obtained from the base of the skull through the vertex without intravenous contrast. COMPARISON:  Jan 16, 2017 FINDINGS: Brain: No subdural, epidural, or subarachnoid hemorrhage noted. Cerebellum, brainstem, and basal cisterns noted. Ventricles and sulci are unchanged with no acute abnormality. No acute cortical ischemia infarct. No mass effect or midline shift. Vascular: Calcified atherosclerosis in the intracranial carotids. Skull: Normal. Negative for fracture or focal lesion. Sinuses/Orbits: No acute finding. Other: None. IMPRESSION: No acute intracranial abnormalities. Electronically Signed   By: Dorise Bullion III M.D   On: 07/27/2018 00:36      No results found for: HGBA1C Lab Results  Component Value Date   MICROALBUR 33.5 08/26/2016   LDLCALC 56 06/08/2018   CREATININE 2.03 (H) 07/28/2018       Scheduled Meds: . amLODipine  10 mg Oral Daily  . aspirin  81 mg Oral Daily  . atorvastatin  40 mg Oral QPM  . brimonidine  1 drop Both Eyes BID  . dorzolamide-timolol  1 drop Both Eyes BID  . heparin  5,000 Units Subcutaneous Q8H  . hydrALAZINE  10 mg Oral Q8H  . latanoprost  1 drop Both Eyes QHS  . multivitamin with minerals  1 tablet Oral Daily  . sodium bicarbonate  650 mg Oral TID  . sodium chloride flush  3 mL Intravenous Q12H   Continuous Infusions:   LOS: 0 days    Time spent: >30 MINS    Reyne Dumas  [facility] Pager [phone]. If 7PM-7AM, please contact night-coverage  at [email or web address], password [facility] 07/28/2018, 2:12 PM  LOS: 0 days

## 2018-07-29 DIAGNOSIS — G9341 Metabolic encephalopathy: Secondary | ICD-10-CM | POA: Diagnosis present

## 2018-07-29 DIAGNOSIS — Z7982 Long term (current) use of aspirin: Secondary | ICD-10-CM | POA: Diagnosis not present

## 2018-07-29 DIAGNOSIS — E7800 Pure hypercholesterolemia, unspecified: Secondary | ICD-10-CM | POA: Diagnosis present

## 2018-07-29 DIAGNOSIS — I739 Peripheral vascular disease, unspecified: Secondary | ICD-10-CM | POA: Diagnosis present

## 2018-07-29 DIAGNOSIS — H409 Unspecified glaucoma: Secondary | ICD-10-CM | POA: Diagnosis present

## 2018-07-29 DIAGNOSIS — N184 Chronic kidney disease, stage 4 (severe): Secondary | ICD-10-CM

## 2018-07-29 DIAGNOSIS — E872 Acidosis: Secondary | ICD-10-CM | POA: Diagnosis present

## 2018-07-29 DIAGNOSIS — Z888 Allergy status to other drugs, medicaments and biological substances status: Secondary | ICD-10-CM | POA: Diagnosis not present

## 2018-07-29 DIAGNOSIS — I129 Hypertensive chronic kidney disease with stage 1 through stage 4 chronic kidney disease, or unspecified chronic kidney disease: Secondary | ICD-10-CM | POA: Diagnosis present

## 2018-07-29 DIAGNOSIS — Z8546 Personal history of malignant neoplasm of prostate: Secondary | ICD-10-CM | POA: Diagnosis not present

## 2018-07-29 DIAGNOSIS — K219 Gastro-esophageal reflux disease without esophagitis: Secondary | ICD-10-CM | POA: Diagnosis present

## 2018-07-29 DIAGNOSIS — I10 Essential (primary) hypertension: Secondary | ICD-10-CM

## 2018-07-29 DIAGNOSIS — R55 Syncope and collapse: Secondary | ICD-10-CM | POA: Diagnosis present

## 2018-07-29 DIAGNOSIS — I272 Pulmonary hypertension, unspecified: Secondary | ICD-10-CM | POA: Diagnosis present

## 2018-07-29 DIAGNOSIS — F0390 Unspecified dementia without behavioral disturbance: Secondary | ICD-10-CM | POA: Diagnosis present

## 2018-07-29 DIAGNOSIS — Z87891 Personal history of nicotine dependence: Secondary | ICD-10-CM | POA: Diagnosis not present

## 2018-07-29 DIAGNOSIS — Z79899 Other long term (current) drug therapy: Secondary | ICD-10-CM | POA: Diagnosis not present

## 2018-07-29 LAB — GLUCOSE, CAPILLARY: Glucose-Capillary: 99 mg/dL (ref 70–99)

## 2018-07-29 LAB — COMPREHENSIVE METABOLIC PANEL
ALT: 18 U/L (ref 0–44)
ANION GAP: 6 (ref 5–15)
AST: 29 U/L (ref 15–41)
Albumin: 3.5 g/dL (ref 3.5–5.0)
Alkaline Phosphatase: 58 U/L (ref 38–126)
BUN: 31 mg/dL — ABNORMAL HIGH (ref 8–23)
CO2: 19 mmol/L — AB (ref 22–32)
Calcium: 10 mg/dL (ref 8.9–10.3)
Chloride: 116 mmol/L — ABNORMAL HIGH (ref 98–111)
Creatinine, Ser: 2.14 mg/dL — ABNORMAL HIGH (ref 0.61–1.24)
GFR calc non Af Amer: 27 mL/min — ABNORMAL LOW (ref >60)
GFR, EST AFRICAN AMERICAN: 31 mL/min — AB (ref >60)
Glucose, Bld: 110 mg/dL — ABNORMAL HIGH (ref 70–99)
Potassium: 3.9 mmol/L (ref 3.5–5.1)
SODIUM: 141 mmol/L (ref 135–145)
Total Bilirubin: 0.7 mg/dL (ref 0.3–1.2)
Total Protein: 7.3 g/dL (ref 6.5–8.1)

## 2018-07-29 LAB — AMMONIA: Ammonia: 13 umol/L (ref 9–35)

## 2018-07-29 MED ORDER — STERILE WATER FOR INJECTION IV SOLN
INTRAVENOUS | Status: AC
  Administered 2018-07-29: 22:00:00 via INTRAVENOUS
  Filled 2018-07-29: qty 9.71

## 2018-07-29 NOTE — Progress Notes (Signed)
Physical Therapy Treatment Patient Details Name: Patrick [name]. MRN: [id number] DOB: 1932-02-01 Today's Date: 07/29/2018    History of Present Illness 84 y.o.malewith medical history significant ofCKD stage 4 fistula in place but not on dialysis yet, PAH, HTN, prostate cancerpresented to the ED with dizziness and syncope. MRI negative.    PT Comments    Pt tolerated session well, and able to increase gait distance today.  Continues to demo some deficits with attention and awareness, requiring verbal cues for safety throughout ambulation.  Occasional weaving with ambulation, with 1 LOB to L, which pt self corrects.  Discussed f/u with HHPT at d/c with pt and significant other.  Will continue to follow acutely to maximize mobility, endurance, and independence prior to d/c home.    Follow Up Recommendations  Home health PT;Supervision/Assistance - 24 hour     Equipment Recommendations  None recommended by PT    Recommendations for Other Services       Precautions / Restrictions Precautions Precautions: Fall Restrictions Weight Bearing Restrictions: No    Mobility  Bed Mobility Overal bed mobility: Needs Assistance Bed Mobility: Supine to Sit     Supine to sit: Supervision     General bed mobility comments: supervision for safety  Transfers Overall transfer level: Needs assistance Equipment used: None Transfers: Sit to/from Stand Sit to Stand: Supervision         General transfer comment: MIn Guard A for safety  Ambulation/Gait Ambulation/Gait assistance: Min guard;Min assist Gait Distance (Feet): 200 Feet Assistive device: None Gait Pattern/deviations: Step-through pattern Gait velocity: very slow Gait velocity interpretation: <1.31 ft/sec, indicative of household ambulator General Gait Details: periods of drifting and 1 LOB to L which pt self corrects.     Stairs             Wheelchair Mobility    Modified Rankin (Stroke Patients Only)       Balance Overall balance assessment: Mild deficits observed, not formally tested                                          Cognition Arousal/Alertness: Awake/alert Behavior During Therapy: WFL for tasks assessed/performed;Impulsive Overall Cognitive Status: Impaired/Different from baseline Area of Impairment: Attention;Awareness;Problem solving                 Orientation Level: Disoriented to;Time Current Attention Level: Sustained Memory: Decreased short-term memory Following Commands: Follows one step commands with increased time Safety/Judgement: Decreased awareness of safety;Decreased awareness of deficits Awareness: Emergent Problem Solving: Slow processing;Decreased initiation;Difficulty sequencing;Requires verbal cues;Requires tactile cues General Comments: pt with decreased sustained attention to task, internally distracted, requiring cues to stay focused.  Pt unable to ambulate and carry on a conversation at the same time without mod/max cues to continue ambulating.       Exercises      General Comments General comments (skin integrity, edema, etc.): VSS      Pertinent Vitals/Pain Pain Assessment: No/denies pain    Home Living                      Prior Function            PT Goals (current goals can now be found in the care plan section) Acute Rehab PT Goals Patient Stated Goal: "Go home" PT Goal Formulation: With patient Time For Goal Achievement: 08/04/18 Potential to Achieve  Goals: Good Progress towards PT goals: Progressing toward goals    Frequency    Min 3X/week      PT Plan Current plan remains appropriate    Co-evaluation              AM-PAC PT "6 Clicks" Daily Activity  Outcome Measure  Difficulty turning over in bed (including adjusting bedclothes, sheets and blankets)?: A Little Difficulty moving from lying on back to sitting on the side of the bed? : A Little Difficulty sitting down on  and standing up from a chair with arms (e.g., wheelchair, bedside commode, etc,.)?: A Little Help needed moving to and from a bed to chair (including a wheelchair)?: A Little Help needed walking in hospital room?: A Little Help needed climbing 3-5 steps with a railing? : A Little 6 Click Score: 18    End of Session Equipment Utilized During Treatment: Gait belt Activity Tolerance: Patient tolerated treatment well Patient left: in chair;with call bell/phone within reach;with chair alarm set;with family/visitor present Nurse Communication: Mobility status PT Visit Diagnosis: Unsteadiness on feet (R26.81);Other symptoms and signs involving the nervous system (R29.898);Other abnormalities of gait and mobility (R26.89)     Time: 2505-3976 PT Time Calculation (min) (ACUTE ONLY): 28 min  Charges:  $Gait Training: 8-22 mins $Therapeutic Activity: 8-22 mins                      Michel Santee 07/29/2018, 3:17 PM

## 2018-07-29 NOTE — Progress Notes (Signed)
[facility] PROGRESS NOTE  Patrick [name]. XBD:[id number] DOB: 1931/11/23 DOA: 07/26/2018   PCP: Steele Sizer, MD     Assessment/Plan: Principal Problem:   Syncope Active Problems:   Benign essential HTN   Pulmonary hypertension (HCC)   Chronic kidney disease, stage IV (severe) (HCC)   Malignant neoplasm of prostate (HCC)   Metabolic acidosis, normal anion gap (NAG)   Dementia without behavioral disturbance ([place])   Glaucoma   84 y.o.malewith medical history significant ofCKD stage 4 fistula in place but not on dialysis yet, PAH, HTN, prostate cancer presented to the ED with dizziness, possible syncope ,, according to the wife he passed out twice, CT head without contrast negative, chest x-ray no active cardiopulmonary disease, troponin negative 1, bicarbonate noted to be 13 likely secondary to see chronic kidney disease, and recently started on acetazolamide by his ophthalmologist, improving oral bicarb, remains significantly confused, still awake alert x1, discussed with wife, currently he was high functional, was still working in a job, and driving himself, most recently this last Friday.  Subjective:  -Recent himself denies any complaints, per staff no significant events overnight.   Assessment and plan  Syncope No evidence on heart block, or significant bradycardia telemetry 2-D echo showed EF of 65-70%, LVH, grade 1 diastolic dysfunction, CT of the head without any acute intracranial abnormalities MRI of the brain ordered and pending to rule out acute CVA in the setting of hypertension and uncontrolled blood pressure  Hypertension -patient takes very low dose hydralazine and amlodipine -Blood pressure remains labile, for now we will continue to monitor on current regimen.  Non-anion gap metabolic acidosis -She was recently started on acetazolamide 3 weeks ago for glaucoma by his ophthalmologist but this is the most likely contributing factor to  his metabolic acidosis, bicarb has been improving on oral supplement, but still at 19, I will start on bicarb drip overnight . -As well his CKD contributing to his metabolic acidosis   Metabolic  Encephalopathy -The patient remains confused, is answering most of his questions appropriately, but upon further questioning appear he is not back at his baseline, as I discussed with wife, he was high functional, driving and going to his work on a daily basis, most recent on Friday, he is currently awake alert x1, he does not know he is in hospital, cannot recall his name, is not aware of the date or president names as well, B12 within normal limits, TSH within normal limits, this is most likely acute event, MRI brain/CT head with no acute findings, continue to correct his metabolic acidosis and reassess in a.m.  pulmonary hypertension-mild on 2-D echo, currently on no treatment  Glaucoma -Continue with eyedrops, I have called his office, Dr Coralie Carpen office, discussed with the office manager who discussed the case with Dr. Alanda Slim, plan to hold acetazolamide for now, continue with eyedrops, to follow with the office next week to recheck eye pressure.  CK D stage IV with nonionic gap metabolic acidosis Renal function stable  History of prostate cancer Being followed by urology as outpatient  DVT prophylaxsis heparin  Code Status:  Full code      Family Communication: Discussed with wife via phone   Disposition Plan: home when stable    Consultants:  none  Procedures:  none  Antibiotics: Anti-infectives (From admission, onward)   None           Objective: Vitals:   07/29/18 0534 07/29/18 0845 07/29/18 1119  07/29/18 1320  BP: (!) 173/82 (!) 111/94 (!) 181/148 (!) 168/77  Pulse: 65 75  61  Resp: 16   18  Temp: 98.1 F (36.7 C) 98 F (36.7 C)  97.8 F (36.6 C)  TempSrc:  Oral  Oral  SpO2: 100% 100%  100%  Weight:      Height:        Intake/Output Summary (Last  24 hours) at 07/29/2018 1624 Last data filed at 07/29/2018 1124 Gross per 24 hour  Intake 360 ml  Output -  Net 360 ml    Exam:  Examination:  Awake Alert, pleasant, no focal deficits, normal affect, overall patient sounds appropriate, but he when he was asked in details, appears to be significantly confused, does not know location, current events or dates, or president's name symmetrical Chest wall movement, Good air movement bilaterally, CTAB RRR,No Gallops,Rubs or new Murmurs, No Parasternal Heave +ve B.Sounds, Abd Soft, No tenderness, No rebound - guarding or rigidity. No Cyanosis, Clubbing or edema, No new Rash or bruise       Data Reviewed: I have personally reviewed following labs and imaging studies  Micro Results No results found for this or any previous visit (from the past 240 hour(s)).  Radiology Reports X-ray Chest Pa And Lateral  Result Date: 07/27/2018 CLINICAL DATA:  Cough.  Syncope. EXAM: CHEST - 2 VIEW COMPARISON:  07/25/2015 FINDINGS: Normal heart size and pulmonary vascularity. No focal airspace disease or consolidation in the lungs. No blunting of costophrenic angles. No pneumothorax. Mediastinal contours appear intact. Degenerative changes in the spine. IMPRESSION: No active cardiopulmonary disease. Electronically Signed   By: Lucienne Capers M.D.   On: 07/27/2018 05:51   Ct Head Wo Contrast  Result Date: 07/27/2018 CLINICAL DATA:  Altered consciousness. EXAM: CT HEAD WITHOUT CONTRAST TECHNIQUE: Contiguous axial images were obtained from the base of the skull through the vertex without intravenous contrast. COMPARISON:  Jan 16, 2017 FINDINGS: Brain: No subdural, epidural, or subarachnoid hemorrhage noted. Cerebellum, brainstem, and basal cisterns noted. Ventricles and sulci are unchanged with no acute abnormality. No acute cortical ischemia infarct. No mass effect or midline shift. Vascular: Calcified atherosclerosis in the intracranial carotids. Skull:  Normal. Negative for fracture or focal lesion. Sinuses/Orbits: No acute finding. Other: None. IMPRESSION: No acute intracranial abnormalities. Electronically Signed   By: Dorise Bullion III M.D   On: 07/27/2018 00:36   Mr Brain Wo Contrast  Result Date: 07/28/2018 CLINICAL DATA:  Altered mental status, dizziness. Syncopal episodes. History of substance abuse. History of hypertension, hypercholesterolemia, end-stage renal disease, prostate cancer. EXAM: MRI HEAD WITHOUT CONTRAST TECHNIQUE: Multiplanar, multiecho pulse sequences of the brain and surrounding structures were obtained without intravenous contrast. COMPARISON:  CT HEAD July 27, 2018 FINDINGS: INTRACRANIAL CONTENTS: No reduced diffusion to suggest acute ischemia. No susceptibility artifact to suggest hemorrhage. The ventricles and sulci are normal for patient's age. Patchy supratentorial white matter FLAIR T2 hyperintensities compatible with mild chronic small vessel ischemic changes, less than expected for age. No suspicious parenchymal signal, masses, mass effect. No abnormal extra-axial fluid collections. No extra-axial masses. VASCULAR: Normal major intracranial vascular flow voids present at skull base. SKULL AND UPPER CERVICAL SPINE: No abnormal sellar expansion. No suspicious calvarial bone marrow signal. Craniocervical junction maintained. SINUSES/ORBITS: Mild paranasal sinus mucosal thickening with subcentimeter mucosal retention cyst.The included ocular globes and orbital contents are non-suspicious. Status post bilateral ocular lens implants. OTHER: None. IMPRESSION: Negative noncontrast MRI head for age. Electronically Signed   By: Elon Alas  M.D.   On: 07/28/2018 15:58     CBC Recent Labs  Lab 07/26/18 2355  WBC 4.5  HGB 11.0*  HCT 33.2*  PLT 149*  MCV 96.2  MCH 31.9  MCHC 33.1  RDW 14.1    Chemistries  Recent Labs  Lab 07/26/18 2355 07/27/18 0531 07/28/18 0439 07/29/18 0346  NA 133* 140 139 141  K  3.3* 4.0 3.5 3.9  CL 116* 121* 118* 116*  CO2 13* 13* 14* 19*  GLUCOSE 144* 111* 102* 110*  BUN 37* 35* 30* 31*  CREATININE 2.29* 2.15* 2.03* 2.14*  CALCIUM 9.2 9.1 9.8 10.0  MG  --  2.2  --   --   AST  --   --  24 29  ALT  --   --  14 18  ALKPHOS  --   --  54 58  BILITOT  --   --  1.0 0.7   ------------------------------------------------------------------------------------------------------------------ estimated creatinine clearance is 29.8 mL/min (A) (by C-G formula based on SCr of 2.14 mg/dL (H)). ------------------------------------------------------------------------------------------------------------------ No results for input(s): HGBA1C in the last 72 hours. ------------------------------------------------------------------------------------------------------------------ No results for input(s): CHOL, HDL, LDLCALC, TRIG, CHOLHDL, LDLDIRECT in the last 72 hours. ------------------------------------------------------------------------------------------------------------------ Recent Labs    07/28/18 1624  TSH 3.194   ------------------------------------------------------------------------------------------------------------------ Recent Labs    07/28/18 1624  VITAMINB12 452    Coagulation profile No results for input(s): INR, PROTIME in the last 168 hours.  No results for input(s): DDIMER in the last 72 hours.  Cardiac Enzymes Recent Labs  Lab 07/26/18 2355 07/27/18 1224  TROPONINI <0.03 0.04*   ------------------------------------------------------------------------------------------------------------------ Invalid input(s): POCBNP   CBG: Recent Labs  Lab 07/28/18 0749 07/29/18 0733  GLUCAP 90 99       Studies: Mr Brain Wo Contrast  Result Date: 07/28/2018 CLINICAL DATA:  Altered mental status, dizziness. Syncopal episodes. History of substance abuse. History of hypertension, hypercholesterolemia, end-stage renal disease, prostate cancer. EXAM:  MRI HEAD WITHOUT CONTRAST TECHNIQUE: Multiplanar, multiecho pulse sequences of the brain and surrounding structures were obtained without intravenous contrast. COMPARISON:  CT HEAD July 27, 2018 FINDINGS: INTRACRANIAL CONTENTS: No reduced diffusion to suggest acute ischemia. No susceptibility artifact to suggest hemorrhage. The ventricles and sulci are normal for patient's age. Patchy supratentorial white matter FLAIR T2 hyperintensities compatible with mild chronic small vessel ischemic changes, less than expected for age. No suspicious parenchymal signal, masses, mass effect. No abnormal extra-axial fluid collections. No extra-axial masses. VASCULAR: Normal major intracranial vascular flow voids present at skull base. SKULL AND UPPER CERVICAL SPINE: No abnormal sellar expansion. No suspicious calvarial bone marrow signal. Craniocervical junction maintained. SINUSES/ORBITS: Mild paranasal sinus mucosal thickening with subcentimeter mucosal retention cyst.The included ocular globes and orbital contents are non-suspicious. Status post bilateral ocular lens implants. OTHER: None. IMPRESSION: Negative noncontrast MRI head for age. Electronically Signed   By: Elon Alas M.D.   On: 07/28/2018 15:58      No results found for: HGBA1C Lab Results  Component Value Date   MICROALBUR 33.5 08/26/2016   LDLCALC 56 06/08/2018   CREATININE 2.14 (H) 07/29/2018       Scheduled Meds: . amLODipine  10 mg Oral Daily  . aspirin  81 mg Oral Daily  . atorvastatin  40 mg Oral QPM  . brimonidine  1 drop Both Eyes BID  . dorzolamide-timolol  1 drop Both Eyes BID  . heparin  5,000 Units Subcutaneous Q8H  . hydrALAZINE  10 mg Oral Q8H  . latanoprost  1 drop Both  Eyes QHS  . multivitamin with minerals  1 tablet Oral Daily  . sodium bicarbonate  650 mg Oral TID  . sodium chloride flush  3 mL Intravenous Q12H   Continuous Infusions:   LOS: 0 days     Phillips Climes MD [facility] Pager  [phone]. If 7PM-7AM, please contact night-coverage at [email or web address], password [facility] 07/29/2018, 4:24 PM  LOS: 0 days

## 2018-07-29 NOTE — Progress Notes (Signed)
Occupational Therapy Treatment Patient Details Name: Patrick [name]. MRN: [id number] DOB: [birth year] Today's Date: 07/29/2018    History of present illness 84 y.o.malewith medical history significant ofCKD stage 4 fistula in place but not on dialysis yet, PAH, HTN, prostate cancerpresented to the ED with dizziness and syncope. MRI negative.   OT comments  Pt progressing towards established OT goals. Pt performing oral care and hand hygiene at sink with supervision for safety. Pt urinating at toilet with supervision for safety and balance. Pt performing functional mobility in hallway with Min Guard A and required increased time. Pt continues to present with decreased cognition and safety awareness. Continue to recommend dc home with HHOT and will continue to follow acutely as admitted.    Follow Up Recommendations  Home health OT;Supervision/Assistance - 24 hour    Equipment Recommendations  None recommended by OT    Recommendations for Other Services PT consult    Precautions / Restrictions Precautions Precautions: Fall       Mobility Bed Mobility Overal bed mobility: Needs Assistance Bed Mobility: Supine to Sit     Supine to sit: Supervision     General bed mobility comments: supervision for safety  Transfers Overall transfer level: Needs assistance Equipment used: None Transfers: Sit to/from Stand Sit to Stand: Min guard         General transfer comment: MIn Guard A for safety    Balance Overall balance assessment: Mild deficits observed, not formally tested                                         ADL either performed or assessed with clinical judgement   ADL Overall ADL's : Needs assistance/impaired     Grooming: Set up;Supervision/safety;Standing;Oral care;Wash/dry hands Grooming Details (indicate cue type and reason): Pt performing oral care and handhygiene at sink with supervision.          Upper Body Dressing :  Supervision/safety;Set up;Sitting Upper Body Dressing Details (indicate cue type and reason): donning second gown like jacket     Toilet Transfer: Min guard;Ambulation(simulated in room) Toilet Transfer Details (indicate cue type and reason): Min Guard A for safety Toileting- Clothing Manipulation and Hygiene: Supervision/safety;Sit to/from stand Toileting - Clothing Manipulation Details (indicate cue type and reason): supervision for safety. Pt urniating at toilet while maintaining standing balance     Functional mobility during ADLs: Min guard(close Min Guard for safety due to impulsivity) General ADL Comments: Pt with decreased cognition and safety awareness. Requiring Min Guard during OOB activity to maintain safety.      Vision       Perception     Praxis      Cognition Arousal/Alertness: Awake/alert Behavior During Therapy: WFL for tasks assessed/performed;Impulsive Overall Cognitive Status: Impaired/Different from baseline Area of Impairment: Orientation;Attention;Memory;Following commands;Safety/judgement;Awareness;Problem solving                 Orientation Level: Disoriented to;Time Current Attention Level: Selective Memory: Decreased short-term memory Following Commands: Follows one step commands with increased time Safety/Judgement: Decreased awareness of safety;Decreased awareness of deficits Awareness: Emergent Problem Solving: Slow processing;Difficulty sequencing;Requires verbal cues;Requires tactile cues General Comments: Pt continue to present with decreased safety awareness and is highly distracted. Pt continues to present with impulsivity. Pt orineted to self, place, and parts of situation. Enjoys laughing and joking        Exercises     Shoulder  Instructions       General Comments VSS    Pertinent Vitals/ Pain       Pain Assessment: No/denies pain  Home Living                                          Prior  Functioning/Environment              Frequency  Min 2X/week        Progress Toward Goals  OT Goals(current goals can now be found in the care plan section)  Progress towards OT goals: Progressing toward goals  Acute Rehab OT Goals Patient Stated Goal: "Go home" OT Goal Formulation: With patient Time For Goal Achievement: 08/11/18 Potential to Achieve Goals: Good ADL Goals Pt Will Perform Grooming: with modified independence;standing Pt Will Transfer to Toilet: with modified independence;regular height toilet;ambulating Additional ADL Goal #1: Pt will perform three part trail making task with Min cues Additional ADL Goal #2: Pt will collect ADL items for grooming task with Min cues.  Plan Discharge plan remains appropriate    Co-evaluation                 AM-PAC PT "6 Clicks" Daily Activity     Outcome Measure   Help from another person eating meals?: None Help from another person taking care of personal grooming?: A Little Help from another person toileting, which includes using toliet, bedpan, or urinal?: A Little Help from another person bathing (including washing, rinsing, drying)?: A Little Help from another person to put on and taking off regular upper body clothing?: A Little Help from another person to put on and taking off regular lower body clothing?: A Little 6 Click Score: 19    End of Session Equipment Utilized During Treatment: Gait belt  OT Visit Diagnosis: Unsteadiness on feet (R26.81);Other abnormalities of gait and mobility (R26.89);Muscle weakness (generalized) (M62.81);Other symptoms and signs involving cognitive function   Activity Tolerance Patient tolerated treatment well   Patient Left with call bell/phone within reach;in chair;with chair alarm set;with nursing/sitter in room   Nurse Communication Mobility status        Time: 9150-5697 OT Time Calculation (min): 24 min  Charges: OT General Charges $OT Visit: 1 Visit OT  Treatments $Self Care/Home Management : 23-37 mins  [place], OTR/L Acute Rehab Pager: [phone] Office: [place] 07/29/2018, 3:00 PM

## 2018-07-29 NOTE — Care Management Note (Addendum)
Case Management Note  Patient Details  Name: Patrick [name]. MRN: [id number] Date of Birth: [birth year]  Subjective/Objective:      Syncope. Hx of CKD stage 4 fistula in place but not on dialysis yet, PAH, HTN, prostate cancer. From home with wife. PTA independent with ADL's , no DME  Usage. Recently retired from work.             Owns cane and walker   Wade Asebedo (Spouse)     [phone]      PCP: Steele Sizer  Action/Plan:   Per PT evaluation/ recommendation Home health PT;Supervision/Assistance - 24 hour  Expected Discharge Date:                  Expected Discharge Plan:  [place]  In-House Referral:     Discharge planning Services  CM Consult  Post Acute Care Choice:    Choice offered to:   pt/wife  DME Arranged:    N/A DME Agency:    N/A HH Arranged:  PT,OT [place] Agency:  [facility], pending MD's order  Status of Service:   completed  If discussed at Long Length of Stay Meetings, dates discussed:    Additional Comments:  Sharin Mons, RN 07/29/2018, 1:00 PM

## 2018-07-30 LAB — BASIC METABOLIC PANEL
ANION GAP: 4 — AB (ref 5–15)
BUN: 31 mg/dL — AB (ref 8–23)
CALCIUM: 9.4 mg/dL (ref 8.9–10.3)
CO2: 22 mmol/L (ref 22–32)
Chloride: 111 mmol/L (ref 98–111)
Creatinine, Ser: 2.2 mg/dL — ABNORMAL HIGH (ref 0.61–1.24)
GFR calc Af Amer: 30 mL/min — ABNORMAL LOW (ref >60)
GFR calc non Af Amer: 26 mL/min — ABNORMAL LOW (ref >60)
GLUCOSE: 101 mg/dL — AB (ref 70–99)
Potassium: 3.4 mmol/L — ABNORMAL LOW (ref 3.5–5.1)
Sodium: 137 mmol/L (ref 135–145)

## 2018-07-30 MED ORDER — SODIUM BICARBONATE 650 MG PO TABS: 650.0000 mg | ORAL_TABLET | Freq: Two times a day (BID) | ORAL | 0 refills | Status: DC

## 2018-07-30 MED ORDER — HYDRALAZINE HCL 10 MG PO TABS: 10.0000 mg | ORAL_TABLET | Freq: Four times a day (QID) | ORAL | 1 refills | Status: DC

## 2018-07-30 MED ORDER — SODIUM BICARBONATE 650 MG PO TABS: 650.0000 mg | ORAL_TABLET | Freq: Two times a day (BID) | ORAL | 0 refills | Status: AC

## 2018-07-30 NOTE — Progress Notes (Signed)
Physical Therapy Treatment Patient Details Name: Patrick [name]. MRN: [id number] DOB: [birth year] Today's Date: 07/30/2018    History of Present Illness 84 y.o.malewith medical history significant ofCKD stage 4 fistula in place but not on dialysis yet, PAH, HTN, prostate cancerpresented to the ED with dizziness and syncope. MRI negative.    PT Comments    Pt has made visible improvements.  Still not solidly focused on task, but notably improved cognition.  Steady gait in a home-like environment.  Needs to work on gait speed, b/c will be out in the community.    Follow Up Recommendations  Home health PT;Supervision/Assistance - 24 hour     Equipment Recommendations  None recommended by PT    Recommendations for Other Services       Precautions / Restrictions Precautions Precautions: Fall Restrictions Weight Bearing Restrictions: No    Mobility  Bed Mobility               General bed mobility comments: up in the chair on arrival  Transfers Overall transfer level: Needs assistance Equipment used: None Transfers: Sit to/from Stand Sit to Stand: Supervision            Ambulation/Gait Ambulation/Gait assistance: Supervision;Min guard Gait Distance (Feet): 200 Feet Assistive device: None Gait Pattern/deviations: Step-through pattern Gait velocity: very slow, pt states that he has no reason to go fast in the hospital Gait velocity interpretation: <1.31 ft/sec, indicative of household ambulator General Gait Details: generally steady, some drift, but no overt deviation or LOB.   Stairs Stairs: Yes Stairs assistance: Supervision Stair Management: One rail Right;Step to pattern;Forwards Number of Stairs: 6 General stair comments: slow and deliberate, but safe with the rail   Wheelchair Mobility    Modified Rankin (Stroke Patients Only)       Balance                                            Cognition Arousal/Alertness:  Awake/alert Behavior During Therapy: WFL for tasks assessed/performed;Impulsive Overall Cognitive Status: (improving, but still likely not at baseline)                     Current Attention Level: Selective   Following Commands: Follows one step commands with increased time   Awareness: Emergent          Exercises      General Comments        Pertinent Vitals/Pain      Home Living                      Prior Function            PT Goals (current goals can now be found in the care plan section) Acute Rehab PT Goals Patient Stated Goal: "Go home" PT Goal Formulation: With patient Time For Goal Achievement: 08/04/18 Potential to Achieve Goals: Good Progress towards PT goals: Progressing toward goals    Frequency    Min 3X/week      PT Plan Current plan remains appropriate    Co-evaluation              AM-PAC PT "6 Clicks" Daily Activity  Outcome Measure  Difficulty turning over in bed (including adjusting bedclothes, sheets and blankets)?: A Little Difficulty moving from lying on back to sitting on the side of the bed? :  A Little Difficulty sitting down on and standing up from a chair with arms (e.g., wheelchair, bedside commode, etc,.)?: A Little Help needed moving to and from a bed to chair (including a wheelchair)?: A Little Help needed walking in hospital room?: A Little Help needed climbing 3-5 steps with a railing? : A Little 6 Click Score: 18    End of Session   Activity Tolerance: Patient tolerated treatment well Patient left: in chair;with call bell/phone within reach;with chair alarm set Nurse Communication: Mobility status PT Visit Diagnosis: Unsteadiness on feet (R26.81)     Time: 5320-2334 PT Time Calculation (min) (ACUTE ONLY): 28 min  Charges:  $Gait Training: 8-22 mins $Therapeutic Activity: 8-22 mins                     07/30/2018  Donnella Sham, PT Acute Rehabilitation Services [phone]   (pager) [phone]  (office)   Tessie Fass Roseland Braun 07/30/2018, 11:27 AM

## 2018-07-30 NOTE — Discharge Instructions (Signed)
Follow with Primary MD Steele Sizer, MD in 7 days   Get CBC, CMP,  checked  by Primary MD next visit.    Activity: As tolerated with Full fall precautions use walker/cane & assistance as needed   Disposition Home    Diet: Heart Healthy  , with feeding assistance and aspiration precautions.  For Heart failure patients - Check your Weight same time everyday, if you gain over 2 pounds, or you develop in leg swelling, experience more shortness of breath or chest pain, call your Primary MD immediately. Follow Cardiac Low Salt Diet and 1.5 lit/day fluid restriction.   On your next visit with your primary care physician please Get Medicines reviewed and adjusted.   Please request your Prim.MD to go over all Hospital Tests and Procedure/Radiological results at the follow up, please get all Hospital records sent to your Prim MD by signing hospital release before you go home.   If you experience worsening of your admission symptoms, develop shortness of breath, life threatening emergency, suicidal or homicidal thoughts you must seek medical attention immediately by calling 911 or calling your MD immediately  if symptoms less severe.  You Must read complete instructions/literature along with all the possible adverse reactions/side effects for all the Medicines you take and that have been prescribed to you. Take any new Medicines after you have completely understood and accpet all the possible adverse reactions/side effects.   Do not drive, operating heavy machinery, perform activities at heights, swimming or participation in water activities or provide baby sitting services if your were admitted for syncope or siezures until you have seen by Primary MD or a Neurologist and advised to do so again.  Do not drive when taking Pain medications.    Do not take more than prescribed Pain, Sleep and Anxiety Medications  Special Instructions: If you have smoked or chewed Tobacco  in the last 2 yrs  please stop smoking, stop any regular Alcohol  and or any Recreational drug use.  Wear Seat belts while driving.   Please note  You were cared for by a hospitalist during your hospital stay. If you have any questions about your discharge medications or the care you received while you were in the hospital after you are discharged, you can call the unit and asked to speak with the hospitalist on call if the hospitalist that took care of you is not available. Once you are discharged, your primary care physician will handle any further medical issues. Please note that NO REFILLS for any discharge medications will be authorized once you are discharged, as it is imperative that you return to your primary care physician (or establish a relationship with a primary care physician if you do not have one) for your aftercare needs so that they can reassess your need for medications and monitor your lab values.

## 2018-07-30 NOTE — Discharge Summary (Signed)
[address] [name]., is a 84 y.o. male  DOB 07-14-1932  MRN [id number].  Admission date:  07/26/2018  Admitting Physician  Etta Quill, DO  Discharge Date:  07/30/2018   Primary MD  Steele Sizer, MD  Recommendations for primary care physician for things to follow:  - please check CBC, BMP in 1 week to ensure stable bicarb . -Patient to follow with ophthalmology next week, discussed with the wife, already has an appointment scheduled next Tuesday.   Admission Diagnosis  Syncope [R55] Syncope, unspecified syncope type [R55]   Discharge Diagnosis  Syncope [R55] Syncope, unspecified syncope type [R55]    Principal Problem:   Syncope Active Problems:   Benign essential HTN   Pulmonary hypertension (HCC)   Chronic kidney disease, stage IV (severe) (HCC)   Malignant neoplasm of prostate (HCC)   Metabolic acidosis, normal anion gap (NAG)   Dementia without behavioral disturbance (HCC)   Glaucoma      Past Medical History:  Diagnosis Date  . Anemia   . Arthritis   . Bradycardia   . Chronic kidney disease   . CKD (chronic kidney disease), stage IV ([place])   . GERD (gastroesophageal reflux disease)   . High cholesterol   . Hypertension   . IBS (irritable bowel syndrome)   . Pneumonia   . Prostate cancer ([place])   . Pulmonary HTN ([place]) 11/05/2015   18mmHg on echo 2017  . PVD (peripheral vascular disease) ([place])   . Syncope 07/2018    Past Surgical History:  Procedure Laterality Date  . BASCILIC VEIN TRANSPOSITION Left 09/05/2016   Procedure: FIRST STAGE BASILIC VEIN TRANSPOSITION left arm;  Surgeon: Serafina Mitchell, MD;  Location: [facility];  Service: Vascular;  Laterality: Left;  . BASCILIC VEIN TRANSPOSITION Left 11/14/2016   Procedure: BASCILIC VEIN TRANSPOSITION-LEFT ARM 2ND STAGE;  Surgeon: Serafina Mitchell, MD;  Location: [facility];  Service: Vascular;  Laterality: Left;  . COLONOSCOPY      . PROSTATE SURGERY    . PROSTATECTOMY         History of present illness and  Hospital Course:     Kindly see H&P for history of present illness and admission details, please review complete Labs, Consult reports and Test reports for all details in brief  HPI  from the history and physical done on the day of admission 07/27/2018 HPI: Patrick [name]. is a 84 y.o. male with medical history significant of CKD stage 4 fistula in place but not on dialysis yet, PAH, HTN, prostate cancer.  Patient presents to the ED for evaluation of syncope.  Patient reports that he woke up from sleep and thought it was morning. He started to get ready for work. He reports that he started to feel dizzy. He is not sure what happened next, wife found him lying on the floor. He believes that he passed out. No injury. Patient did not experience any chest pain or shortness of breath. He reports that he wet himself when the  episode occurred.  Second episode of syncope at home after EMS arrived that he doesn't remember either.   ED Course: 3rd episode of syncope while on CT scan table in ED.  Patient returning to being alert and answering questions after each episode.  Patient does readily admit that his memory isnt the greatest and so when I asked him questions about the Diamox he told me I should ask his wife.    Hospital Course    25 y.o.malewith medical history significant ofCKD stage 4 fistula in place but not on dialysis yet, PAH, HTN, prostate cancerpresented to the ED with dizziness, possible syncope ,, according to the wife he passed out twice, CT head without contrast negative, chest x-ray no active cardiopulmonary disease, troponin negative 1, bicarbonate noted to be 13 likely secondary to see chronic kidney disease, and recently started on acetazolamide by his ophthalmologist, patient with significant confusion during hospital stay, significantly improved after correction of  his low bicarb level, tach to baseline today, I have discussed with his wife, he is has an appointment already scheduled next Tuesday with his ophthalmologist.  Syncope No evidence on heart block, or significant bradycardia telemetry 2-D echo showed EF of 65-70%, LVH, grade 1 diastolic dysfunction, CT of the head without any acute intracranial abnormalities MRI of the brain with no acute findings  Hypertension -patient takes very low dose hydralazine and amlodipine, pressure was uncontrolled, so his hydralazine was increased from 10 mg twice daily to 10 mg 4 times daily.  Non-anion gap metabolic acidosis -She was recently started on acetazolamide 3 weeks ago for glaucoma by his ophthalmologist but this is the most likely contributing factor to his metabolic acidosis, bicarb has been improving on oral supplement, was on IV bicarb overnight, with bicarb level of 22 today, will be discharged on 7 days of bicarb 1 tablet twice daily, to follow BMP at his PCP office in 1 week, discussed with wife . -As well his CKD contributing to his metabolic acidosis   Metabolic  Encephalopathy -B12 within normal limits, TSH within normal limits, MRI brain/CT head with no acute findings,  -Most likely in the setting of metabolic acidosis, morning mentation significantly improved, awake alert x3, coherent and back to baseline .  pulmonary hypertension -mild on 2-D echo, currently on no treatment  Glaucoma -Continue with eyedrops, I have called his office, Dr Coralie Carpen office, discussed with the office manager who discussed the case with Dr. Alanda Slim, plan to hold acetazolamide for now, continue with eyedrops, to follow with the office next week to recheck eye pressure.  CK D stage IV with nonionic gap metabolic acidosis Renal function stable  History of prostate cancer Being followed by urology as outpatient    Discharge Condition:  Stable   Follow UP  Follow-up Information    Steele Sizer, MD Follow up in 1 week(s).   Specialty:  Family Medicine Contact information: [address] [place] [address] [phone]        Gretchen Short, DO Follow up in 5 day(s).   Specialty:  Ophthalmology Contact information: [place] [address] [phone]             Discharge Instructions  and  Discharge Medications     Discharge Instructions    Discharge instructions   Complete by:  As directed    Follow with Primary MD Steele Sizer, MD in 7 days   Get CBC, CMP,  checked  by Primary MD next visit.  Activity: As tolerated with Full fall precautions use walker/cane & assistance as needed   Disposition Home    Diet: Heart Healthy  , with feeding assistance and aspiration precautions.  For Heart failure patients - Check your Weight same time everyday, if you gain over 2 pounds, or you develop in leg swelling, experience more shortness of breath or chest pain, call your Primary MD immediately. Follow Cardiac Low Salt Diet and 1.5 lit/day fluid restriction.   On your next visit with your primary care physician please Get Medicines reviewed and adjusted.   Please request your Prim.MD to go over all Hospital Tests and Procedure/Radiological results at the follow up, please get all Hospital records sent to your Prim MD by signing hospital release before you go home.   If you experience worsening of your admission symptoms, develop shortness of breath, life threatening emergency, suicidal or homicidal thoughts you must seek medical attention immediately by calling 911 or calling your MD immediately  if symptoms less severe.  You Must read complete instructions/literature along with all the possible adverse reactions/side effects for all the Medicines you take and that have been prescribed to you. Take any new Medicines after you have completely understood and accpet all the possible adverse reactions/side effects.   Do not  drive, operating heavy machinery, perform activities at heights, swimming or participation in water activities or provide baby sitting services if your were admitted for syncope or siezures until you have seen by Primary MD or a Neurologist and advised to do so again.  Do not drive when taking Pain medications.    Do not take more than prescribed Pain, Sleep and Anxiety Medications  Special Instructions: If you have smoked or chewed Tobacco  in the last 2 yrs please stop smoking, stop any regular Alcohol  and or any Recreational drug use.  Wear Seat belts while driving.   Please note  You were cared for by a hospitalist during your hospital stay. If you have any questions about your discharge medications or the care you received while you were in the hospital after you are discharged, you can call the unit and asked to speak with the hospitalist on call if the hospitalist that took care of you is not available. Once you are discharged, your primary care physician will handle any further medical issues. Please note that NO REFILLS for any discharge medications will be authorized once you are discharged, as it is imperative that you return to your primary care physician (or establish a relationship with a primary care physician if you do not have one) for your aftercare needs so that they can reassess your need for medications and monitor your lab values.   Increase activity slowly   Complete by:  As directed      Allergies as of 07/30/2018      Reactions   Ace Inhibitors Cough   cough      Medication List    STOP taking these medications   acetaZOLAMIDE 250 MG tablet Commonly known as:  DIAMOX     TAKE these medications   amLODipine 10 MG tablet Commonly known as:  NORVASC Take 1 tablet (10 mg total) by mouth daily.   aspirin 81 MG tablet Take 81 mg by mouth daily.   atorvastatin 40 MG tablet Commonly known as:  LIPITOR Take 1 tablet (40 mg total) by mouth every evening.     brimonidine 0.2 % ophthalmic solution Commonly known as:  ALPHAGAN Place 1 drop into  both eyes 2 (two) times daily.   dorzolamide-timolol 22.3-6.8 MG/ML ophthalmic solution Commonly known as:  COSOPT Place 1 drop into both eyes 2 (two) times daily.   famotidine 20 MG tablet Commonly known as:  PEPCID Take 1 tablet (20 mg total) by mouth 2 (two) times daily as needed for heartburn or indigestion.   hydrALAZINE 10 MG tablet Commonly known as:  APRESOLINE Take 1 tablet (10 mg total) by mouth 4 (four) times daily. What changed:  when to take this   latanoprost 0.005 % ophthalmic solution Commonly known as:  XALATAN Place 1 drop into both eyes at bedtime.   multivitamin with minerals Tabs tablet Take 1 tablet by mouth daily.   sodium bicarbonate 650 MG tablet Take 1 tablet (650 mg total) by mouth 2 (two) times daily.   vitamin C 500 MG tablet Commonly known as:  ASCORBIC ACID Take 500 mg by mouth daily.   Vitamin E 400 units Chew Chew 400 Units by mouth daily.         Diet and Activity recommendation: See Discharge Instructions above   Consults obtained -  none   Major procedures and Radiology Reports - PLEASE review detailed and final reports for all details, in brief -     X-ray Chest Pa And Lateral  Result Date: 07/27/2018 CLINICAL DATA:  Cough.  Syncope. EXAM: CHEST - 2 VIEW COMPARISON:  07/25/2015 FINDINGS: Normal heart size and pulmonary vascularity. No focal airspace disease or consolidation in the lungs. No blunting of costophrenic angles. No pneumothorax. Mediastinal contours appear intact. Degenerative changes in the spine. IMPRESSION: No active cardiopulmonary disease. Electronically Signed   By: Lucienne Capers M.D.   On: 07/27/2018 05:51   Ct Head Wo Contrast  Result Date: 07/27/2018 CLINICAL DATA:  Altered consciousness. EXAM: CT HEAD WITHOUT CONTRAST TECHNIQUE: Contiguous axial images were obtained from the base of the skull through the vertex  without intravenous contrast. COMPARISON:  Jan 16, 2017 FINDINGS: Brain: No subdural, epidural, or subarachnoid hemorrhage noted. Cerebellum, brainstem, and basal cisterns noted. Ventricles and sulci are unchanged with no acute abnormality. No acute cortical ischemia infarct. No mass effect or midline shift. Vascular: Calcified atherosclerosis in the intracranial carotids. Skull: Normal. Negative for fracture or focal lesion. Sinuses/Orbits: No acute finding. Other: None. IMPRESSION: No acute intracranial abnormalities. Electronically Signed   By: Dorise Bullion III M.D   On: 07/27/2018 00:36   Mr Brain Wo Contrast  Result Date: 07/28/2018 CLINICAL DATA:  Altered mental status, dizziness. Syncopal episodes. History of substance abuse. History of hypertension, hypercholesterolemia, end-stage renal disease, prostate cancer. EXAM: MRI HEAD WITHOUT CONTRAST TECHNIQUE: Multiplanar, multiecho pulse sequences of the brain and surrounding structures were obtained without intravenous contrast. COMPARISON:  CT HEAD July 27, 2018 FINDINGS: INTRACRANIAL CONTENTS: No reduced diffusion to suggest acute ischemia. No susceptibility artifact to suggest hemorrhage. The ventricles and sulci are normal for patient's age. Patchy supratentorial white matter FLAIR T2 hyperintensities compatible with mild chronic small vessel ischemic changes, less than expected for age. No suspicious parenchymal signal, masses, mass effect. No abnormal extra-axial fluid collections. No extra-axial masses. VASCULAR: Normal major intracranial vascular flow voids present at skull base. SKULL AND UPPER CERVICAL SPINE: No abnormal sellar expansion. No suspicious calvarial bone marrow signal. Craniocervical junction maintained. SINUSES/ORBITS: Mild paranasal sinus mucosal thickening with subcentimeter mucosal retention cyst.The included ocular globes and orbital contents are non-suspicious. Status post bilateral ocular lens implants. OTHER: None.  IMPRESSION: Negative noncontrast MRI head for age. Electronically Signed  By: Elon Alas M.D.   On: 07/28/2018 15:58    Micro Results     No results found for this or any previous visit (from the past 240 hour(s)).     Today   Subjective:   Umar Patmon today has no headache,no chest abdominal pain,no new weakness tingling or numbness, feels much better today.  Objective:   Blood pressure (!) 155/80, pulse 72, temperature 97.7 F (36.5 C), temperature source Oral, resp. rate 18, height 6\' 2"  (1.88 m), weight 77.4 kg, SpO2 99 %.   Intake/Output Summary (Last 24 hours) at 07/30/2018 1015 Last data filed at 07/30/2018 1003 Gross per 24 hour  Intake 849.63 ml  Output -  Net 849.63 ml    Exam Awake Alert, Oriented x 3, No new F.N deficits, Normal affect Symmetrical Chest wall movement, Good air movement bilaterally, CTAB RRR,No Gallops,Rubs or new Murmurs, No Parasternal Heave +ve B.Sounds, Abd Soft, Non tender,No rebound -guarding or rigidity. No Cyanosis, Clubbing or edema, No new Rash or bruise  Data Review   CBC w Diff:  Lab Results  Component Value Date   WBC 4.5 07/26/2018   HGB 11.0 (L) 07/26/2018   HCT 33.2 (L) 07/26/2018   HCT 35.8 (L) 09/27/2015   PLT 149 (L) 07/26/2018   LYMPHOPCT 40 08/26/2016   MONOPCT 11.4 11/05/2017   EOSPCT 4.0 11/05/2017   BASOPCT 0.6 11/05/2017    CMP:  Lab Results  Component Value Date   NA 137 07/30/2018   NA 139 09/27/2015   K 3.4 (L) 07/30/2018   CL 111 07/30/2018   CO2 22 07/30/2018   BUN 31 (H) 07/30/2018   BUN 31 (H) 09/27/2015   CREATININE 2.20 (H) 07/30/2018   CREATININE 2.49 (H) 06/08/2018   PROT 7.3 07/29/2018   PROT 7.6 09/27/2015   ALBUMIN 3.5 07/29/2018   ALBUMIN 4.2 09/27/2015   BILITOT 0.7 07/29/2018   BILITOT 0.5 09/27/2015   ALKPHOS 58 07/29/2018   AST 29 07/29/2018   ALT 18 07/29/2018  .   Total Time in preparing paper work, data evaluation and todays exam - 48 minutes  Phillips Climes M.D on 07/30/2018 at 10:15 AM  [facility]   Office  [phone]

## 2018-07-30 NOTE — Progress Notes (Signed)
Patient was discharged home with home health by MD order; discharged instructions review and give to patient and his wife with care notes and prescription; IV DIC; skin intact; patient will be escorted to the car by a volunteer via wheelchair.

## 2018-07-30 NOTE — Progress Notes (Signed)
Occupational Therapy Treatment Patient Details Name: Patrick [name]. MRN: [id number] DOB: Aug 09, 1932 Today's Date: 07/30/2018    History of present illness 84 y.o.malewith medical history significant ofCKD stage 4 fistula in place but not on dialysis yet, PAH, HTN, prostate cancerpresented to the ED with dizziness and syncope. MRI negative.   OT comments  Pt progressing towards acute OT goals. Remains with impaired cognition including difficulty dual tasking, decreased insight into deficits, some impulsivity impacting safety with ADLs. Tangential and verbal perseveration noted. D/c plan remains appropriate.    Follow Up Recommendations  Home health OT;Supervision/Assistance - 24 hour    Equipment Recommendations  None recommended by OT    Recommendations for Other Services      Precautions / Restrictions Precautions Precautions: Fall Restrictions Weight Bearing Restrictions: No       Mobility Bed Mobility Overal bed mobility: Needs Assistance Bed Mobility: Supine to Sit     Supine to sit: Supervision     General bed mobility comments: up in the chair on arrival  Transfers Overall transfer level: Needs assistance Equipment used: None Transfers: Sit to/from Stand Sit to Stand: Supervision              Balance Overall balance assessment: Mild deficits observed, not formally tested                                         ADL either performed or assessed with clinical judgement   ADL Overall ADL's : Needs assistance/impaired                                     Functional mobility during ADLs: Min guard General ADL Comments: Discussed plan to do grooming tasks at sink, toilet, and go into the hallway. After taking meds and coming to standing position pt politely declining, "oh no that's ok." Therapist explained role of OT and goals of session. Pt still politely declining further mobilty and ambulation. walked to recliner  and left sitting up in chair at end of session     Vision       Perception     Praxis      Cognition Arousal/Alertness: Awake/alert Behavior During Therapy: [facility] for tasks assessed/performed;Impulsive Overall Cognitive Status: Impaired/Different from baseline Area of Impairment: Attention;Awareness;Problem solving                 Orientation Level: Disoriented to;Time Current Attention Level: Selective Memory: Decreased short-term memory Following Commands: Follows one step commands with increased time Safety/Judgement: Decreased awareness of safety;Decreased awareness of deficits Awareness: Emergent Problem Solving: Slow processing;Decreased initiation;Difficulty sequencing;Requires verbal cues General Comments: Pt with decreased insight into deficits. Difficulty with dual tasking. Verbal perseveration noted. Able to state name and DOB (delay in response noted) and that he is in the hospital. Tangental.        Exercises     Shoulder Instructions       General Comments      Pertinent Vitals/ Pain       Pain Assessment: No/denies pain  Home Living                                          Prior Functioning/Environment  Frequency  Min 2X/week        Progress Toward Goals  OT Goals(current goals can now be found in the care plan section)  Progress towards OT goals: Progressing toward goals  Acute Rehab OT Goals Patient Stated Goal: "Go home" OT Goal Formulation: With patient Time For Goal Achievement: 08/11/18 Potential to Achieve Goals: Good ADL Goals Pt Will Perform Grooming: with modified independence;standing Pt Will Transfer to Toilet: with modified independence;regular height toilet;ambulating Additional ADL Goal #1: Pt will perform three part trail making task with Min cues Additional ADL Goal #2: Pt will collect ADL items for grooming task with Min cues.  Plan Discharge plan remains appropriate     Co-evaluation                 AM-PAC PT "6 Clicks" Daily Activity     Outcome Measure   Help from another person eating meals?: None Help from another person taking care of personal grooming?: A Little Help from another person toileting, which includes using toliet, bedpan, or urinal?: A Little Help from another person bathing (including washing, rinsing, drying)?: A Little Help from another person to put on and taking off regular upper body clothing?: A Little Help from another person to put on and taking off regular lower body clothing?: A Little 6 Click Score: 19    End of Session    OT Visit Diagnosis: Unsteadiness on feet (R26.81);Other abnormalities of gait and mobility (R26.89);Muscle weakness (generalized) (M62.81);Other symptoms and signs involving cognitive function   Activity Tolerance Patient tolerated treatment well   Patient Left in chair;with call bell/phone within reach;with chair alarm set   Nurse Communication          Time: [id number] OT Time Calculation (min): 20 min  Charges: OT General Charges $OT Visit: 1 Visit OT Treatments $Self Care/Home Management : 8-22 mins  [name] Schimke, OT Acute Rehabilitation Services Pager: [phone] Office: [phone]    Hortencia Pilar 07/30/2018, 12:08 PM

## 2018-07-31 ENCOUNTER — Telehealth: Payer: Self-pay | Admitting: Family Medicine

## 2018-07-31 ENCOUNTER — Ambulatory Visit: Payer: Self-pay

## 2018-07-31 NOTE — Telephone Encounter (Signed)
Copied from [place] [id number]. Topic: Quick Communication - See Telephone Encounter >> Jul 31, 2018  9:26 AM Ahmed Prima L wrote: CRM for notification. See Telephone encounter for: 07/31/18.  Patient's wife, Altha Harm called and said he was just discharged from the hospital on 07/30/18. She said that the hospital wrote him a script for sodium bicarbonate 650 MG tablet and that Dr Ancil Boozer just wrote him a script on 07/17/18 for famotidine (PEPCID) 20 MG tablet. She said he has not opened the bottle yet for famotidine (PEPCID) 20 MG tablet. She is wanting to clarify which medication that he needs to actually take because she does not want him taking too much of something. She also states that he is suppose to follow up in one week with Dr Ancil Boozer and she has no open appointments until 12/20. I checked Emily's scheduled and she is not available either until 12/3. Please contact Christine @ [phone]

## 2018-07-31 NOTE — Telephone Encounter (Signed)
Transition Care Management Follow-up Telephone Call  Date of discharge and from where: 07/30/18 [facility]  How have you been since you were released from the hospital? Pt feeling much better  Any questions or concerns? No   Items Reviewed:  Did the pt receive and understand the discharge instructions provided? Yes   Medications obtained and verified? Yes   Any new allergies since your discharge? No   Dietary orders reviewed? Yes heart healthy low sodium  Do you have support at home? Yes   Functional Questionnaire: (I = Independent and D = Dependent) ADLs: I  Bathing/Dressing- I  Meal Prep- I  Eating- I  Maintaining continence- I  Transferring/Ambulation- I  Managing Meds- I  Follow up appointments reviewed:   PCP Hospital f/u appt confirmed? Yes  Scheduled to see Dr. Ancil Boozer  on 08/07/18 @ 8:20.  [facility] f/u appt confirmed? Yes  Scheduled to see opthamology on 08/04/18 @ 8:50.  Are transportation arrangements needed? No   If their condition worsens, is the pt aware to call PCP or go to the Emergency Dept.? Yes  Was the patient provided with contact information for the PCP's office or ED? Yes  Was to pt encouraged to call back with questions or concerns? Yes  Spoke with patient's wife Altha Harm. He is feeling much better. Physical therapy home health scheduled to come out tomorrow from [facility]. She is aware of discharge instructions and has contacted the office for 2 specific medication questions regarding hydralazine and pepcid prescriptions. Medications reviewed and advised do not make changes until pt is seen by Dr. Ancil Boozer or otherwise notified to do anything different. Pt's wife appreciative of TCM call.

## 2018-07-31 NOTE — Telephone Encounter (Deleted)
  Reason for Disposition . Caller has NON-URGENT medication question about med that PCP prescribed and triager unable to answer question  Protocols used: MEDICATION QUESTION CALL-A-AH

## 2018-07-31 NOTE — Telephone Encounter (Signed)
LMOM for his wife, he needs to take sodium bicarbonate given during hospital stay, pepcid was sent in place of ranitidine because of recall

## 2018-07-31 NOTE — Telephone Encounter (Signed)
Left msg for patient's wife Altha Harm to call back to complete TCM call for hospital follow up. Message has already been sent to Dr. Ancil Boozer regarding scheduling.   Requested pt call me back directly at [phone]

## 2018-07-31 NOTE — Telephone Encounter (Signed)
Patient's wife called and she has questions about his medications prescribed at discharge from the hospital. She says that he was prescribed Hydralazine 10 mg by mouth 4 x day. She says he has 50 mg at home that he was taking 4 x day and wondering if she can just give him the 50 mg tablet once a day instead of 10 mg 4 x day. I advised that the 10 mg 4 x day equals 40 mg/day and undoubtedly, that's what the hospital provider wanted him to have based on what was going on in the hospital, but I will send to the Dr. Ancil Boozer for her recommendation. His wife also says the patient was prescribed sodium bicarbonate 650 mg by mouth 2 x day and he has a prescription from Dr. Ancil Boozer for Pepcid 2 x day. She says both are for gas and indigestion, should he be taking both of them. I advised that according to the discharge summary from the hospital provider, he had metabolic acidosis and was on IV sodium bicarbonate then switched to oral, which worked to correct the problem, so he ordered additional days of the medication to continue to correct. I advised the Famotidine is for his indigestion, so both should be given as prescribed, she verbalized understanding.  Reason for Disposition . Caller has NON-URGENT medication question about med that PCP prescribed and triager unable to answer question  Protocols used: MEDICATION QUESTION CALL-A-AH

## 2018-08-01 DIAGNOSIS — N184 Chronic kidney disease, stage 4 (severe): Secondary | ICD-10-CM | POA: Diagnosis not present

## 2018-08-01 DIAGNOSIS — I272 Pulmonary hypertension, unspecified: Secondary | ICD-10-CM | POA: Diagnosis not present

## 2018-08-01 DIAGNOSIS — I739 Peripheral vascular disease, unspecified: Secondary | ICD-10-CM | POA: Diagnosis not present

## 2018-08-01 DIAGNOSIS — H409 Unspecified glaucoma: Secondary | ICD-10-CM | POA: Diagnosis not present

## 2018-08-01 DIAGNOSIS — F0390 Unspecified dementia without behavioral disturbance: Secondary | ICD-10-CM | POA: Diagnosis not present

## 2018-08-01 DIAGNOSIS — I129 Hypertensive chronic kidney disease with stage 1 through stage 4 chronic kidney disease, or unspecified chronic kidney disease: Secondary | ICD-10-CM | POA: Diagnosis not present

## 2018-08-01 DIAGNOSIS — R55 Syncope and collapse: Secondary | ICD-10-CM | POA: Diagnosis not present

## 2018-08-01 DIAGNOSIS — E872 Acidosis: Secondary | ICD-10-CM | POA: Diagnosis not present

## 2018-08-01 DIAGNOSIS — C61 Malignant neoplasm of prostate: Secondary | ICD-10-CM | POA: Diagnosis not present

## 2018-08-04 DIAGNOSIS — H401123 Primary open-angle glaucoma, left eye, severe stage: Secondary | ICD-10-CM | POA: Diagnosis not present

## 2018-08-05 DIAGNOSIS — C61 Malignant neoplasm of prostate: Secondary | ICD-10-CM | POA: Diagnosis not present

## 2018-08-05 DIAGNOSIS — N184 Chronic kidney disease, stage 4 (severe): Secondary | ICD-10-CM | POA: Diagnosis not present

## 2018-08-05 DIAGNOSIS — I739 Peripheral vascular disease, unspecified: Secondary | ICD-10-CM | POA: Diagnosis not present

## 2018-08-05 DIAGNOSIS — I129 Hypertensive chronic kidney disease with stage 1 through stage 4 chronic kidney disease, or unspecified chronic kidney disease: Secondary | ICD-10-CM | POA: Diagnosis not present

## 2018-08-05 DIAGNOSIS — I272 Pulmonary hypertension, unspecified: Secondary | ICD-10-CM | POA: Diagnosis not present

## 2018-08-05 DIAGNOSIS — F0390 Unspecified dementia without behavioral disturbance: Secondary | ICD-10-CM | POA: Diagnosis not present

## 2018-08-05 DIAGNOSIS — E872 Acidosis: Secondary | ICD-10-CM | POA: Diagnosis not present

## 2018-08-05 DIAGNOSIS — H409 Unspecified glaucoma: Secondary | ICD-10-CM | POA: Diagnosis not present

## 2018-08-05 DIAGNOSIS — R55 Syncope and collapse: Secondary | ICD-10-CM | POA: Diagnosis not present

## 2018-08-07 ENCOUNTER — Ambulatory Visit (INDEPENDENT_AMBULATORY_CARE_PROVIDER_SITE_OTHER): Payer: Medicare HMO | Admitting: Family Medicine

## 2018-08-07 ENCOUNTER — Telehealth: Payer: Self-pay | Admitting: Family Medicine

## 2018-08-07 ENCOUNTER — Encounter: Payer: Self-pay | Admitting: Family Medicine

## 2018-08-07 VITALS — BP 138/70 | HR 61 | Temp 98.4°F | Ht 74.0 in | Wt 182.6 lb

## 2018-08-07 DIAGNOSIS — I519 Heart disease, unspecified: Secondary | ICD-10-CM | POA: Diagnosis not present

## 2018-08-07 DIAGNOSIS — I272 Pulmonary hypertension, unspecified: Secondary | ICD-10-CM

## 2018-08-07 DIAGNOSIS — I10 Essential (primary) hypertension: Secondary | ICD-10-CM | POA: Diagnosis not present

## 2018-08-07 DIAGNOSIS — N184 Chronic kidney disease, stage 4 (severe): Secondary | ICD-10-CM

## 2018-08-07 DIAGNOSIS — G9341 Metabolic encephalopathy: Secondary | ICD-10-CM

## 2018-08-07 DIAGNOSIS — Z09 Encounter for follow-up examination after completed treatment for conditions other than malignant neoplasm: Secondary | ICD-10-CM | POA: Diagnosis not present

## 2018-08-07 DIAGNOSIS — R630 Anorexia: Secondary | ICD-10-CM

## 2018-08-07 DIAGNOSIS — D696 Thrombocytopenia, unspecified: Secondary | ICD-10-CM | POA: Diagnosis not present

## 2018-08-07 DIAGNOSIS — F320 Major depressive disorder, single episode, mild: Secondary | ICD-10-CM

## 2018-08-07 DIAGNOSIS — I5189 Other ill-defined heart diseases: Secondary | ICD-10-CM

## 2018-08-07 MED ORDER — SERTRALINE HCL 50 MG PO TABS: 50.0000 mg | ORAL_TABLET | Freq: Every day | ORAL | 0 refills | Status: DC

## 2018-08-07 MED ORDER — HYDRALAZINE HCL 25 MG PO TABS: 25.0000 mg | ORAL_TABLET | Freq: Two times a day (BID) | ORAL | 0 refills | Status: DC

## 2018-08-07 NOTE — Telephone Encounter (Signed)
Copied from [place] [id number]. Topic: Quick Communication - See Telephone Encounter >> Aug 07, 2018 11:06 AM Rutherford Nail, NT wrote: CRM for notification. See Telephone encounter for: 08/07/18. Patient's wife calling and states that Dr Ancil Boozer wanted her to call back with the name of the medication that was missing off of the list. Patient's wife states that it is the Olmesartan 40 MG one time a day. Would like a call back if there are any questions.  CB#: [phone]

## 2018-08-07 NOTE — Progress Notes (Signed)
Name: Patrick [name].   MRN: [id number]    DOB: 1932/06/07   Date:08/07/2018       Progress Note  Subjective  Chief Complaint  Chief Complaint  Patient presents with  . Hospitalization Follow-up    HPI  Hospital discharge follow up: he had two episodes of syncope at home on 11/17, he was transported to [facility] by EMS. He was very confused and discharge with diagnosis of metabolic acidosis likely exacerbated by rx of  Acetazolamide given by ophthalmologist. He was discharged home with Hydralazine to take qid, continue Norvasc. At the time wife did not tell them about another bp medication he has at home ( she does not recall the name - but will contact me later today with the name) stop acetazolamide and follow up with eye doctor and PCP. Labs and studies done at [facility] were reviewed with patient and his wife. He has been feeling better since discharged, he is not sleeping as much , he finished bicarbonate supplementation. Wife states she is very worried about his appetite and patient and his wife are worried about the fact that he has been depressed. No energy, now cannot work or drive, no motivation. Feeling sad, wife states symptoms present for at least 3 months.    Patient Active Problem List   Diagnosis Date Noted  . Metabolic acidosis, normal anion gap (NAG) 07/27/2018  . Dementia without behavioral disturbance ([place]) 07/27/2018  . Glaucoma 07/27/2018  . Syncope 07/27/2018  . Secondary hyperparathyroidism ([place]) 11/26/2016  . Mild cognitive impairment 01/03/2016  . Elevated prostate specific antigen (PSA) 09/08/2015  . Bone pain 06/13/2015  . Spinal stenosis 06/13/2015  . OP (osteoporosis) 03/20/2015  . Allergic rhinitis 03/20/2015  . Lumbar canal stenosis 03/20/2015  . Vitamin D deficiency 03/20/2015  . Restless legs syndrome 03/20/2015  . Drug-induced gynecomastia 03/20/2015  . Diaphragmatic hernia 03/20/2015  . Acquired cyst of kidney 03/20/2015  . Left ventricular  hypertrophy by electrocardiogram 03/20/2015  . Leg pain 03/20/2015  . Chronic kidney disease, stage IV (severe) ([place]) 03/20/2015  . History of pneumonia 03/20/2015  . Non-rheumatic tricuspid valve insufficiency 03/20/2015  . Dysmetabolic syndrome 07/06/2535  . Pulmonary hypertension ([place]) 10/31/2014  . Benign essential HTN 07/29/2014  . Dyslipidemia 07/29/2014  . PVD (peripheral vascular disease) ([place])   . Anemia of chronic disease   . IBS (irritable bowel syndrome)   . Bradycardia   . ED (erectile dysfunction) of organic origin 06/29/2012  . Genuine stress incontinence, male 06/29/2012  . Malignant neoplasm of prostate ([place]) 06/29/2012    Past Surgical History:  Procedure Laterality Date  . BASCILIC VEIN TRANSPOSITION Left 09/05/2016   Procedure: FIRST STAGE BASILIC VEIN TRANSPOSITION left arm;  Surgeon: Serafina Mitchell, MD;  Location: [facility];  Service: Vascular;  Laterality: Left;  . BASCILIC VEIN TRANSPOSITION Left 11/14/2016   Procedure: BASCILIC VEIN TRANSPOSITION-LEFT ARM 2ND STAGE;  Surgeon: Serafina Mitchell, MD;  Location: [facility];  Service: Vascular;  Laterality: Left;  . COLONOSCOPY    . PROSTATE SURGERY    . PROSTATECTOMY      Family History  Problem Relation Age of Onset  . CVA Mother   . Hypertension Mother   . Dementia Mother   . Heart attack Father   . Heart disease Father        before age 44  . Hypertension Father   . Hepatitis C Sister   . Dementia Sister     Social History   Socioeconomic  History  . Marital status: Married    Spouse name: Altha Harm  . Number of children: 1  . Years of education: Not on file  . Highest education level: 6th grade  Occupational History    Employer: [employer]  Social Needs  . Financial resource strain: Not hard at all  . Food insecurity:    Worry: Never true    Inability: Never true  . Transportation needs:    Medical: No    Non-medical: No  Tobacco Use  . Smoking status: Former Smoker    Packs/day: 0.30     Years: 20.00    Pack years: 6.00    Types: Cigarettes    Last attempt to quit: 08/26/1978    Years since quitting: 39.9  . Smokeless tobacco: Never Used  . Tobacco comment: smoking cessation materials not required  Substance and Sexual Activity  . Alcohol use: No    Alcohol/week: 0.0 standard drinks  . Drug use: No  . Sexual activity: Not Currently  Lifestyle  . Physical activity:    Days per week: 0 days    Minutes per session: 0 min  . Stress: Not at all  Relationships  . Social connections:    Talks on phone: Patient refused    Gets together: Patient refused    Attends religious service: Patient refused    Active member of club or organization: Patient refused    Attends meetings of clubs or organizations: Patient refused    Relationship status: Married  . Intimate partner violence:    Fear of current or ex partner: No    Emotionally abused: No    Physically abused: No    Forced sexual activity: No  Other Topics Concern  . Not on file  Social History Narrative   Lives w/ wife   Caffeine use: none   Right handed      Current Outpatient Medications:  .  amLODipine (NORVASC) 10 MG tablet, Take 1 tablet (10 mg total) by mouth daily., Disp: 90 tablet, Rfl: 1 .  aspirin 81 MG tablet, Take 81 mg by mouth daily. , Disp: , Rfl:  .  atorvastatin (LIPITOR) 40 MG tablet, Take 1 tablet (40 mg total) by mouth every evening., Disp: 90 tablet, Rfl: 3 .  brimonidine (ALPHAGAN) 0.2 % ophthalmic solution, Place 1 drop into both eyes 2 (two) times daily., Disp: , Rfl: 0 .  dorzolamide-timolol (COSOPT) 22.3-6.8 MG/ML ophthalmic solution, Place 1 drop into both eyes 2 (two) times daily. , Disp: , Rfl: 1 .  hydrALAZINE (APRESOLINE) 25 MG tablet, Take 1 tablet (25 mg total) by mouth 2 (two) times daily., Disp: 60 tablet, Rfl: 0 .  Latanoprostene Bunod (VYZULTA) 0.024 % SOLN, Apply 1 drop to eye daily., Disp: , Rfl:  .  Multiple Vitamin (MULTIVITAMIN WITH MINERALS) TABS tablet, Take 1 tablet  by mouth daily., Disp: , Rfl:  .  Netarsudil Dimesylate (RHOPRESSA) 0.02 % SOLN, Apply 1 drop to eye daily., Disp: , Rfl:  .  vitamin C (ASCORBIC ACID) 500 MG tablet, Take 500 mg by mouth daily., Disp: , Rfl:  .  Vitamin E 400 UNITS CHEW, Chew 400 Units by mouth daily. , Disp: , Rfl:  .  Apoaequorin (PREVAGEN EXTRA STRENGTH PO), Take 1 tablet by mouth., Disp: , Rfl:  .  latanoprost (XALATAN) 0.005 % ophthalmic solution, Place 1 drop into both eyes at bedtime. , Disp: , Rfl:   Allergies  Allergen Reactions  . Ace Inhibitors Cough  cough    I personally reviewed active problem list, medication list, allergies, family history, social history with the patient/caregiver today.   ROS  Constitutional: Negative for fever or weight change.  Respiratory: Negative for cough and shortness of breath.   Cardiovascular: Negative for chest pain or palpitations.  Gastrointestinal: Negative for abdominal pain, no bowel changes.  Musculoskeletal: Negative for gait problem or joint swelling.  Skin: Negative for rash.  Neurological: Negative for dizziness or headache.  No other specific complaints in a complete review of systems (except as listed in HPI above).  Objective  Vitals:   08/07/18 0813  BP: 138/70  Pulse: 61  Temp: 98.4 F (36.9 C)  SpO2: 99%  Weight: 182 lb 9.6 oz (82.8 kg)  Height: _0  (1.88 m)    Body mass index is 23.44 kg/m.  Physical Exam  Constitutional: Patient appears well-developed and thin. .No distress.  HEENT: head atraumatic, normocephalic, pupils equal and reactive to light,  neck supple, throat within normal limits Cardiovascular: Normal rate, regular rhythm and normal heart sounds.  No murmur heard. No BLE edema. Pulmonary/Chest: Effort normal and breath sounds normal. No respiratory distress. Abdominal: Soft.  There is no tenderness. Psychiatric: Patient has a normal mood and affect. behavior is normal. Judgment and thought content normal. Neurological:  no focal findings.   Recent Results (from the past 2160 hour(s))  COMPLETE METABOLIC PANEL WITH GFR     Status: Abnormal   Collection Time: 06/08/18  3:31 PM  Result Value Ref Range   Glucose, Bld 115 65 - 139 mg/dL    Comment: .        Non-fasting reference interval .    BUN 40 (H) 7 - 25 mg/dL   Creat 2.49 (H) 0.70 - 1.11 mg/dL    Comment: For patients >110 years of age, the reference limit for Creatinine is approximately 13% higher for people identified as African-American. .    GFR, Est Non African American 23 (L) > OR = 60 mL/min/1.13m   GFR, Est African American 26 (L) > OR = 60 mL/min/1.720m  BUN/Creatinine Ratio 16 6 - 22 (calc)   Sodium 143 135 - 146 mmol/L   Potassium 4.5 3.5 - 5.3 mmol/L   Chloride 109 98 - 110 mmol/L   CO2 24 20 - 32 mmol/L   Calcium 10.0 8.6 - 10.3 mg/dL   Total Protein 7.4 6.1 - 8.1 g/dL   Albumin 4.1 3.6 - 5.1 g/dL   Globulin 3.3 1.9 - 3.7 g/dL (calc)   AG Ratio 1.2 1.0 - 2.5 (calc)   Total Bilirubin 0.5 0.2 - 1.2 mg/dL   Alkaline phosphatase (APISO) 70 40 - 115 U/L   AST 15 10 - 35 U/L   ALT 13 9 - 46 U/L  Lipid panel     Status: None   Collection Time: 06/08/18  3:31 PM  Result Value Ref Range   Cholesterol 137 <200 mg/dL   HDL 62 >40 mg/dL   Triglycerides 109 <150 mg/dL   LDL Cholesterol (Calc) 56 mg/dL (calc)    Comment: Reference range: <100 . Desirable range <100 mg/dL for primary prevention;   <70 mg/dL for patients with CHD or diabetic patients  with > or = 2 CHD risk factors. . [name] KitchenDL-C is now calculated using the Martin-Hopkins  calculation, which is a validated novel method providing  better accuracy than the Friedewald equation in the  estimation of LDL-C.  MaCresenciano Genret al. JAAnnamaria [id number];151([id number]  (http://education.[employer].com/faq/FAQ164)  Total CHOL/HDL Ratio 2.2 <5.0 (calc)   Non-HDL Cholesterol (Calc) 75 <130 mg/dL (calc)    Comment: For patients with diabetes plus 1 major ASCVD risk  factor,  treating to a non-HDL-C goal of <100 mg/dL  (LDL-C of <70 mg/dL) is considered a therapeutic  option.   Basic metabolic panel     Status: Abnormal   Collection Time: 07/26/18 11:55 PM  Result Value Ref Range   Sodium 133 (L) 135 - 145 mmol/L   Potassium 3.3 (L) 3.5 - 5.1 mmol/L   Chloride 116 (H) 98 - 111 mmol/L   CO2 13 (L) 22 - 32 mmol/L   Glucose, Bld 144 (H) 70 - 99 mg/dL   BUN 37 (H) 8 - 23 mg/dL   Creatinine, Ser 2.29 (H) 0.61 - 1.24 mg/dL   Calcium 9.2 8.9 - 10.3 mg/dL   GFR calc non Af Amer 25 (L) >60 mL/min   GFR calc Af Amer 28 (L) >60 mL/min    Comment: (NOTE) The eGFR has been calculated using the CKD EPI equation. This calculation has not been validated in all clinical situations. eGFR's persistently <60 mL/min signify possible Chronic Kidney Disease.    Anion gap 4 (L) 5 - 15    Comment: Performed at [facility] [address], [place], [place] [address]  CBC     Status: Abnormal   Collection Time: 07/26/18 11:55 PM  Result Value Ref Range   WBC 4.5 4.0 - 10.5 K/uL   RBC 3.45 (L) 4.22 - 5.81 MIL/uL   Hemoglobin 11.0 (L) 13.0 - 17.0 g/dL   HCT 33.2 (L) 39.0 - 52.0 %   MCV 96.2 80.0 - 100.0 fL   MCH 31.9 26.0 - 34.0 pg   MCHC 33.1 30.0 - 36.0 g/dL   RDW 14.1 11.5 - 15.5 %   Platelets 149 (L) 150 - 400 K/uL   nRBC 0.0 0.0 - 0.2 %    Comment: Performed at [facility], [place] [address] [place], [place] [address]  Troponin I - ONCE - STAT     Status: None   Collection Time: 07/26/18 11:55 PM  Result Value Ref Range   Troponin I <0.03 <0.03 ng/mL    Comment: Performed at [facility], [place] [address], [place], [place] [address]  Urinalysis, Routine w reflex microscopic     Status: Abnormal   Collection Time: 07/27/18  4:01 AM  Result Value Ref Range   Color, Urine YELLOW YELLOW   APPearance CLEAR CLEAR   Specific Gravity, Urine 1.014 1.005 - 1.030   pH 5.0 5.0 - 8.0   Glucose, UA NEGATIVE NEGATIVE mg/dL   Hgb urine dipstick NEGATIVE  NEGATIVE   Bilirubin Urine NEGATIVE NEGATIVE   Ketones, ur NEGATIVE NEGATIVE mg/dL   Protein, ur 100 (A) NEGATIVE mg/dL   Nitrite NEGATIVE NEGATIVE   Leukocytes, UA NEGATIVE NEGATIVE   RBC / HPF 0-5 0 - 5 RBC/hpf   WBC, UA 0-5 0 - 5 WBC/hpf   Bacteria, UA NONE SEEN NONE SEEN   Squamous Epithelial / LPF 0-5 0 - 5   Mucus PRESENT     Comment: Performed at [facility] [address] [place], [place] [address]  I-Stat arterial blood gas, ED     Status: Abnormal   Collection Time: 07/27/18  5:13 AM  Result Value Ref Range   pH, Arterial 7.235 (L) 7.350 - 7.450   pCO2 arterial 29.9 (L) 32.0 - 48.0 mmHg  pO2, Arterial 94.0 83.0 - 108.0 mmHg   Bicarbonate 12.7 (L) 20.0 - 28.0 mmol/L   TCO2 14 (L) 22 - 32 mmol/L   O2 Saturation 96.0 %   Acid-base deficit 14.0 (H) 0.0 - 2.0 mmol/L   Patient temperature 98.2 F    Collection site FEMORAL ARTERY    Sample type ARTERIAL   Basic metabolic panel     Status: Abnormal   Collection Time: 07/27/18  5:31 AM  Result Value Ref Range   Sodium 140 135 - 145 mmol/L    Comment: DELTA CHECK NOTED   Potassium 4.0 3.5 - 5.1 mmol/L    Comment: DELTA CHECK NOTED HEMOLYSIS AT THIS LEVEL MAY AFFECT RESULT    Chloride 121 (H) 98 - 111 mmol/L   CO2 13 (L) 22 - 32 mmol/L   Glucose, Bld 111 (H) 70 - 99 mg/dL   BUN 35 (H) 8 - 23 mg/dL   Creatinine, Ser 2.15 (H) 0.61 - 1.24 mg/dL   Calcium 9.1 8.9 - 10.3 mg/dL   GFR calc non Af Amer 27 (L) >60 mL/min   GFR calc Af Amer 31 (L) >60 mL/min    Comment: (NOTE) The eGFR has been calculated using the CKD EPI equation. This calculation has not been validated in all clinical situations. eGFR's persistently <60 mL/min signify possible Chronic Kidney Disease.    Anion gap 6 5 - 15    Comment: Performed at [facility] [address], [place], [place] [address]  Magnesium     Status: None   Collection Time: 07/27/18  5:31 AM  Result Value Ref Range   Magnesium 2.2 1.7 - 2.4 mg/dL    Comment:  Performed at [facility] [address] [place], [place] [address]  Troponin I - Once     Status: Abnormal   Collection Time: 07/27/18 12:24 PM  Result Value Ref Range   Troponin I 0.04 (HH) <0.03 ng/mL    Comment: CRITICAL RESULT CALLED TO, READ BACK BY AND VERIFIED WITH: Daleen Squibb [id number] 07/27/18 D BRADLEY Performed at [facility], [place] [address], [place], [place] [address]   ECHOCARDIOGRAM COMPLETE     Status: None   Collection Time: 07/27/18  2:54 PM  Result Value Ref Range   Weight 2,927.71 oz   Height 74 in   BP 159/82 mmHg  Blood gas, arterial     Status: Abnormal   Collection Time: 07/28/18  4:25 AM  Result Value Ref Range   FIO2 21.00    pH, Arterial 7.337 (L) 7.350 - 7.450   pCO2 arterial 29.7 (L) 32.0 - 48.0 mmHg   pO2, Arterial 104 83.0 - 108.0 mmHg   Bicarbonate 15.5 (L) 20.0 - 28.0 mmol/L   Acid-base deficit 9.2 (H) 0.0 - 2.0 mmol/L   O2 Saturation 98.1 %   Patient temperature 98.6    Collection site BRACHIAL ARTERY    Drawn by [id number]    Sample type ARTERIAL DRAW    Allens test (pass/fail) PASS PASS  Comprehensive metabolic panel     Status: Abnormal   Collection Time: 07/28/18  4:39 AM  Result Value Ref Range   Sodium 139 135 - 145 mmol/L   Potassium 3.5 3.5 - 5.1 mmol/L   Chloride 118 (H) 98 - 111 mmol/L   CO2 14 (L) 22 - 32 mmol/L   Glucose, Bld 102 (H) 70 - 99 mg/dL   BUN 30 (H) 8 - 23 mg/dL   Creatinine,  Ser 2.03 (H) 0.61 - 1.24 mg/dL   Calcium 9.8 8.9 - 10.3 mg/dL   Total Protein 7.0 6.5 - 8.1 g/dL   Albumin 3.6 3.5 - 5.0 g/dL   AST 24 15 - 41 U/L   ALT 14 0 - 44 U/L   Alkaline Phosphatase 54 38 - 126 U/L   Total Bilirubin 1.0 0.3 - 1.2 mg/dL   GFR calc non Af Amer 28 (L) >60 mL/min   GFR calc Af Amer 33 (L) >60 mL/min    Comment: (NOTE) The eGFR has been calculated using the CKD EPI equation. This calculation has not been validated in all clinical situations. eGFR's persistently <60 mL/min signify possible Chronic  Kidney Disease.    Anion gap 7 5 - 15    Comment: Performed at [facility] [address] [place], [place] [address]  Glucose, capillary     Status: None   Collection Time: 07/28/18  7:49 AM  Result Value Ref Range   Glucose-Capillary 90 70 - 99 mg/dL  Vitamin B12     Status: None   Collection Time: 07/28/18  4:24 PM  Result Value Ref Range   Vitamin B-12 452 180 - 914 pg/mL    Comment: (NOTE) This assay is not validated for testing neonatal or myeloproliferative syndrome specimens for Vitamin B12 levels. Performed at [facility], [place] [address] [place] [address] [place], [place] [address]   TSH     Status: None   Collection Time: 07/28/18  4:24 PM  Result Value Ref Range   TSH 3.194 0.350 - 4.500 uIU/mL    Comment: Performed by a 3rd Generation assay with a functional sensitivity of <=0.01 uIU/mL. Performed at [facility], [place] [address], [place], [place] [address]   Ammonia     Status: None   Collection Time: 07/29/18  3:46 AM  Result Value Ref Range   Ammonia 13 9 - 35 umol/L    Comment: Performed at [facility], [place] [address], [place], [place] [address]  Comprehensive metabolic panel     Status: Abnormal   Collection Time: 07/29/18  3:46 AM  Result Value Ref Range   Sodium 141 135 - 145 mmol/L   Potassium 3.9 3.5 - 5.1 mmol/L   Chloride 116 (H) 98 - 111 mmol/L   CO2 19 (L) 22 - 32 mmol/L   Glucose, Bld 110 (H) 70 - 99 mg/dL   BUN 31 (H) 8 - 23 mg/dL   Creatinine, Ser 2.14 (H) 0.61 - 1.24 mg/dL   Calcium 10.0 8.9 - 10.3 mg/dL   Total Protein 7.3 6.5 - 8.1 g/dL   Albumin 3.5 3.5 - 5.0 g/dL   AST 29 15 - 41 U/L   ALT 18 0 - 44 U/L   Alkaline Phosphatase 58 38 - 126 U/L   Total Bilirubin 0.7 0.3 - 1.2 mg/dL   GFR calc non Af Amer 27 (L) >60 mL/min   GFR calc Af Amer 31 (L) >60 mL/min    Comment: (NOTE) The eGFR has been calculated using the CKD EPI equation. This calculation has not been validated in all clinical situations. eGFR's persistently <60  mL/min signify possible Chronic Kidney Disease.    Anion gap 6 5 - 15    Comment: Performed at [facility] [address] [place], [place] [address]  Glucose, capillary     Status: None   Collection Time: 07/29/18  7:33 AM  Result Value Ref Range  Glucose-Capillary 99 70 - 99 mg/dL  Basic metabolic panel     Status: Abnormal   Collection Time: 07/30/18  3:16 AM  Result Value Ref Range   Sodium 137 135 - 145 mmol/L   Potassium 3.4 (L) 3.5 - 5.1 mmol/L   Chloride 111 98 - 111 mmol/L   CO2 22 22 - 32 mmol/L   Glucose, Bld 101 (H) 70 - 99 mg/dL   BUN 31 (H) 8 - 23 mg/dL   Creatinine, Ser 2.20 (H) 0.61 - 1.24 mg/dL   Calcium 9.4 8.9 - 10.3 mg/dL   GFR calc non Af Amer 26 (L) >60 mL/min   GFR calc Af Amer 30 (L) >60 mL/min    Comment: (NOTE) The eGFR has been calculated using the CKD EPI equation. This calculation has not been validated in all clinical situations. eGFR's persistently <60 mL/min signify possible Chronic Kidney Disease.    Anion gap 4 (L) 5 - 15    Comment: Performed at [facility] [address], [place], [place] [address]    PHQ2/9: Depression screen [facility] 2/9 08/07/2018 06/08/2018 04/09/2018 03/04/2018 11/26/2016  Decreased Interest 0 0 0 0 0  Down, Depressed, Hopeless 0 0 0 0 0  PHQ - 2 Score 0 0 0 0 0  Altered sleeping 0 0 0 - -  Tired, decreased energy 0 0 0 - -  Change in appetite 0 0 0 - -  Feeling bad or failure about yourself  0 0 0 - -  Trouble concentrating 0 0 0 - -  Moving slowly or fidgety/restless 0 0 0 - -  Suicidal thoughts 0 0 0 - -  PHQ-9 Score 0 0 0 - -  Difficult doing work/chores - Not difficult at all Not difficult at all - -     Fall Risk: Fall Risk  08/07/2018 06/08/2018 04/09/2018 03/04/2018 07/07/2017  Falls in the past year? 1 No No No No  Number falls in past yr: 0 - - - -  Injury with Fall? 1 - - - -  Risk for fall due to : - - Impaired vision - -  Risk for fall due to: Comment - - wears eyeglasses - -      Functional Status Survey: Is the patient deaf or have difficulty hearing?: No Does the patient have difficulty seeing, even when wearing glasses/contacts?: Yes Does the patient have difficulty concentrating, remembering, or making decisions?: Yes Does the patient have difficulty walking or climbing stairs?: No Does the patient have difficulty dressing or bathing?: No Does the patient have difficulty doing errands alone such as visiting a doctor's office or shopping?: Yes   Assessment & Plan   1. Benign essential HTN  - COMPLETE METABOLIC PANEL WITH GFR - hydrALAZINE (APRESOLINE) 25 MG tablet; Take 1 tablet (25 mg total) by mouth 2 (two) times daily.  Dispense: 60 tablet; Refill: 0 - but explained needs to hold off on taking higher dose until she calls me back with a the list of medications he has at home  2. Stage 4 chronic kidney disease (HCC)  - COMPLETE METABOLIC PANEL WITH GFR Advised follow up with nephrologist right away  3. Thrombocytopenia (HCC)  - CBC with Differential/Platelet  4. Metabolic encephalopathy  - COMPLETE METABOLIC PANEL WITH GFR  5. Pulmonary hypertension ([place])  On echo done at [facility], wife states she thinks he is taking ACE or ARB, but not on hospital list, explained that bp may have been elevated during hospital  stay because he was not taking medications. They adjusted his hydralazine from 10 mg BID to 10 mg QID, but wife asked to switch back to 25 mg BID   6. Left ventricular diastolic dysfunction, NYHA class 1   7. Hospital discharge follow-up  - CBC with Differential/Platelet - COMPLETE METABOLIC PANEL WITH GFR  8. Poor appetite  We will try treating depression and monitor appetite, discussed protein shake   9. Mild major depression (HCC)  - sertraline (ZOLOFT) 50 MG tablet; Take 1 tablet (50 mg total) by mouth daily.  Dispense: 30 tablet; Refill: 0

## 2018-08-10 ENCOUNTER — Telehealth: Payer: Self-pay

## 2018-08-10 DIAGNOSIS — I129 Hypertensive chronic kidney disease with stage 1 through stage 4 chronic kidney disease, or unspecified chronic kidney disease: Secondary | ICD-10-CM | POA: Diagnosis not present

## 2018-08-10 DIAGNOSIS — N189 Chronic kidney disease, unspecified: Secondary | ICD-10-CM | POA: Diagnosis not present

## 2018-08-10 DIAGNOSIS — N184 Chronic kidney disease, stage 4 (severe): Secondary | ICD-10-CM | POA: Diagnosis not present

## 2018-08-10 DIAGNOSIS — I739 Peripheral vascular disease, unspecified: Secondary | ICD-10-CM | POA: Diagnosis not present

## 2018-08-10 DIAGNOSIS — C61 Malignant neoplasm of prostate: Secondary | ICD-10-CM | POA: Diagnosis not present

## 2018-08-10 DIAGNOSIS — I5030 Unspecified diastolic (congestive) heart failure: Secondary | ICD-10-CM | POA: Diagnosis not present

## 2018-08-10 DIAGNOSIS — N2581 Secondary hyperparathyroidism of renal origin: Secondary | ICD-10-CM | POA: Diagnosis not present

## 2018-08-10 DIAGNOSIS — D631 Anemia in chronic kidney disease: Secondary | ICD-10-CM | POA: Diagnosis not present

## 2018-08-10 DIAGNOSIS — E785 Hyperlipidemia, unspecified: Secondary | ICD-10-CM | POA: Diagnosis not present

## 2018-08-10 DIAGNOSIS — K219 Gastro-esophageal reflux disease without esophagitis: Secondary | ICD-10-CM | POA: Diagnosis not present

## 2018-08-10 NOTE — Telephone Encounter (Signed)
Copied from [place] [id number]. Topic: General - Other >> Aug 10, 2018  2:40 PM Oneta Rack wrote: Osvaldo Human name: Lennette Bihari  Relation to pt: Clinical Manager of Therapy Medi   Call back number: [phone]    Reason for call:  OT eval will be delayed until 08/13/18

## 2018-08-11 NOTE — Telephone Encounter (Signed)
Spoke with patient wife and she stated [employer] never received the Hydralazine prescription and is requesting a 90 day prescription be sent to them. Also wanted to ask if patient still needed to be on Olmesartan 40 daily. It was discontinued while he was in the hospital by Esau Grew but patient was questioning if he should still be taking it with the Amlodipine 10 mg.

## 2018-08-12 ENCOUNTER — Other Ambulatory Visit: Payer: Self-pay

## 2018-08-12 DIAGNOSIS — I10 Essential (primary) hypertension: Secondary | ICD-10-CM

## 2018-08-12 MED ORDER — HYDRALAZINE HCL 25 MG PO TABS: 25.0000 mg | ORAL_TABLET | Freq: Two times a day (BID) | ORAL | 0 refills | Status: DC

## 2018-08-12 NOTE — Telephone Encounter (Signed)
Please ask wife to follow up with nephrologist and find out if he needs to continue on Benicar , sent hydralazine

## 2018-08-12 NOTE — Telephone Encounter (Signed)
Spoke with Mrs. Patrick [name] and informed her Dr. Ancil Boozer sent in the Hydralazine to [facility]. Then informed her it was up to the Nephrologist regarding if he wanted to the patient to stay on Olmesartan 40 MG one time a day or not. Patient wife states the Nephrologist is sending all his notes so Dr. Ancil Boozer can review them.

## 2018-08-14 ENCOUNTER — Telehealth: Payer: Self-pay | Admitting: Family Medicine

## 2018-08-14 DIAGNOSIS — C61 Malignant neoplasm of prostate: Secondary | ICD-10-CM | POA: Diagnosis not present

## 2018-08-14 DIAGNOSIS — H409 Unspecified glaucoma: Secondary | ICD-10-CM | POA: Diagnosis not present

## 2018-08-14 DIAGNOSIS — I739 Peripheral vascular disease, unspecified: Secondary | ICD-10-CM | POA: Diagnosis not present

## 2018-08-14 DIAGNOSIS — I129 Hypertensive chronic kidney disease with stage 1 through stage 4 chronic kidney disease, or unspecified chronic kidney disease: Secondary | ICD-10-CM | POA: Diagnosis not present

## 2018-08-14 DIAGNOSIS — R55 Syncope and collapse: Secondary | ICD-10-CM | POA: Diagnosis not present

## 2018-08-14 DIAGNOSIS — F0390 Unspecified dementia without behavioral disturbance: Secondary | ICD-10-CM | POA: Diagnosis not present

## 2018-08-14 DIAGNOSIS — N184 Chronic kidney disease, stage 4 (severe): Secondary | ICD-10-CM | POA: Diagnosis not present

## 2018-08-14 DIAGNOSIS — E872 Acidosis: Secondary | ICD-10-CM | POA: Diagnosis not present

## 2018-08-14 DIAGNOSIS — I272 Pulmonary hypertension, unspecified: Secondary | ICD-10-CM | POA: Diagnosis not present

## 2018-08-14 NOTE — Telephone Encounter (Signed)
Copied from [place] [id number]. Topic: Quick Communication - See Telephone Encounter >> Aug 14, 2018  4:21 PM Bea Graff, NT wrote: CRM for notification. See Telephone encounter for: 08/14/18. Radovan with [facility] states the pt is doing well and no further OT is needed at this moment. CB#: [phone]

## 2018-08-17 DIAGNOSIS — R972 Elevated prostate specific antigen [PSA]: Secondary | ICD-10-CM | POA: Diagnosis not present

## 2018-08-17 DIAGNOSIS — R9721 Rising PSA following treatment for malignant neoplasm of prostate: Secondary | ICD-10-CM | POA: Diagnosis not present

## 2018-08-17 DIAGNOSIS — C61 Malignant neoplasm of prostate: Secondary | ICD-10-CM | POA: Diagnosis not present

## 2018-08-17 DIAGNOSIS — Z9079 Acquired absence of other genital organ(s): Secondary | ICD-10-CM | POA: Diagnosis not present

## 2018-08-18 ENCOUNTER — Telehealth: Payer: Self-pay | Admitting: Family Medicine

## 2018-08-18 NOTE — Telephone Encounter (Signed)
Copied from [place] [id number]. Topic: Quick Communication - See Telephone Encounter >> Aug 18, 2018  4:31 PM Sheran Luz wrote: CRM for notification. See Telephone encounter for: 08/18/18.  Jalene Mullet, from [facility], called to inform PCP that patient refuses PT

## 2018-09-11 DIAGNOSIS — H409 Unspecified glaucoma: Secondary | ICD-10-CM | POA: Diagnosis not present

## 2018-09-11 DIAGNOSIS — Z01818 Encounter for other preprocedural examination: Secondary | ICD-10-CM | POA: Diagnosis not present

## 2018-09-11 DIAGNOSIS — H401123 Primary open-angle glaucoma, left eye, severe stage: Secondary | ICD-10-CM | POA: Diagnosis not present

## 2018-09-14 DIAGNOSIS — N184 Chronic kidney disease, stage 4 (severe): Secondary | ICD-10-CM | POA: Diagnosis not present

## 2018-09-14 LAB — BASIC METABOLIC PANEL
BUN: 28 — AB (ref 4–21)
Creatinine: 2 — AB (ref 0.6–1.3)
POTASSIUM: 4.2 (ref 3.4–5.3)
SODIUM: 142 (ref 137–147)

## 2018-09-16 ENCOUNTER — Ambulatory Visit (INDEPENDENT_AMBULATORY_CARE_PROVIDER_SITE_OTHER): Payer: Medicare HMO | Admitting: Family Medicine

## 2018-09-16 ENCOUNTER — Encounter: Payer: Self-pay | Admitting: Family Medicine

## 2018-09-16 VITALS — BP 164/86 | HR 75 | Temp 98.0°F | Resp 16 | Ht 74.0 in | Wt 178.2 lb

## 2018-09-16 DIAGNOSIS — R630 Anorexia: Secondary | ICD-10-CM | POA: Diagnosis not present

## 2018-09-16 DIAGNOSIS — F320 Major depressive disorder, single episode, mild: Secondary | ICD-10-CM

## 2018-09-16 DIAGNOSIS — I10 Essential (primary) hypertension: Secondary | ICD-10-CM

## 2018-09-16 DIAGNOSIS — N183 Chronic kidney disease, stage 3 unspecified: Secondary | ICD-10-CM

## 2018-09-16 DIAGNOSIS — I739 Peripheral vascular disease, unspecified: Secondary | ICD-10-CM | POA: Diagnosis not present

## 2018-09-16 DIAGNOSIS — C61 Malignant neoplasm of prostate: Secondary | ICD-10-CM

## 2018-09-16 DIAGNOSIS — I272 Pulmonary hypertension, unspecified: Secondary | ICD-10-CM | POA: Diagnosis not present

## 2018-09-16 DIAGNOSIS — N2581 Secondary hyperparathyroidism of renal origin: Secondary | ICD-10-CM | POA: Diagnosis not present

## 2018-09-16 DIAGNOSIS — E441 Mild protein-calorie malnutrition: Secondary | ICD-10-CM

## 2018-09-16 NOTE — Progress Notes (Signed)
Name: Patrick [name].   MRN: [id number]    DOB: Jul 25, 1932   Date:09/16/2018       Progress Note  Subjective  Chief Complaint  Chief Complaint  Patient presents with  . Hypertension  . Dementia  . dyslipidemia    HPI  HTN: Hewas onValsartan, Norvasc and Hydralazine BID, he has been off Valsartan since hospital stay 07/2018, bp has been in the 150's at home but he gets sluggish and bp is lower. He just had labs done at [facility] but not seen lately. Advised to let them know that bp has been elevated and see if he should resume valsartan. Wife will contact them today. I was not able to see lab results   Angina:no recent episodes of chest pain, no SOB, he denies orthopnea. Unchanged, no pain at this time  Bone Pain: he has multiple bone scans and negative for metastasis.He states he is doing much, no longer having pain.Symptoms were severe 2017, since he has been off Lupron. No longer has bone pain, he was recently seen by Dr Rosana Hoes and he is only going to resume Lupron if PSA goes above 10  Pulmonary hypertension: discussed Revatio therapy, but wife and patient are worried about possible side effects. Unchanged   CKI stageII continue follow up with nephrologist. - [facility] , fistula placed on left upper arm back in 11/2016 and is doing well, has not started HD yet, his kidney went from stage IV to stage III, labs recheck Monday but results not back yet for review  Prostate Cancer: currently seeing Urologist at [facility], Dr. Rosana Hoes. Unchanged   Dyslipidemia: still taking Atorvastatin ,compliant with medication, denies side effects. Last LDL was 56  Dementia: wife states symptoms are stable, slightly forgetful, wife is now dispensing his medications. Still able to drive to work and back daily. . At work he is also getting a little forgetful . MMS 17 first time, refuses to recheck, CIT 6 done recently also abnormal. On Aricept 10 mg Fall 2018, seen by  Neurologist since and normal RPR, B12 and CRP. Wife stopped giving him Aricept because it was not working, he is not longer working - had a syncopal episode Fall 2019.   Glaucoma: he is having blurred vision left eye, had surgery and is using eye drops, cannot drive.   PVD: swelling resolved, has some cramping, but not associated with activity, wearing compression stocking hoses and is doing well.    Malnutrition: his weight has gone down from 220 lbs average to 182 lbs today, now down to 178 lbs he has temporal waisting , eats very little, he states not depressed, he has not been taking protein shakes because of taste. Advised peanut butter for snacks, cheese stick  Major Depression: doing better, phq 9 has improved, it may have been a combination of recent admission, loss of his job, vision problems, took Zoloft but wife noticed that it did not help and he has been off medication, but feeling better now.   Patient Active Problem List   Diagnosis Date Noted  . Metabolic acidosis, normal anion gap (NAG) 07/27/2018  . Dementia without behavioral disturbance ([place]) 07/27/2018  . Glaucoma 07/27/2018  . Syncope 07/27/2018  . Secondary hyperparathyroidism ([place]) 11/26/2016  . Mild cognitive impairment 01/03/2016  . Elevated prostate specific antigen (PSA) 09/08/2015  . Bone pain 06/13/2015  . Spinal stenosis 06/13/2015  . OP (osteoporosis) 03/20/2015  . Allergic rhinitis 03/20/2015  . Lumbar canal stenosis 03/20/2015  .  Vitamin D deficiency 03/20/2015  . Restless legs syndrome 03/20/2015  . Drug-induced gynecomastia 03/20/2015  . Diaphragmatic hernia 03/20/2015  . Acquired cyst of kidney 03/20/2015  . Left ventricular hypertrophy by electrocardiogram 03/20/2015  . Leg pain 03/20/2015  . Chronic kidney disease, stage IV (severe) ([place]) 03/20/2015  . History of pneumonia 03/20/2015  . Non-rheumatic tricuspid valve insufficiency 03/20/2015  . Dysmetabolic syndrome 29/93/7169  . Pulmonary  hypertension ([place]) 10/31/2014  . Benign essential HTN 07/29/2014  . Dyslipidemia 07/29/2014  . PVD (peripheral vascular disease) ([place])   . Anemia of chronic disease   . IBS (irritable bowel syndrome)   . Bradycardia   . ED (erectile dysfunction) of organic origin 06/29/2012  . Genuine stress incontinence, male 06/29/2012  . Malignant neoplasm of prostate ([place]) 06/29/2012    Past Surgical History:  Procedure Laterality Date  . BASCILIC VEIN TRANSPOSITION Left 09/05/2016   Procedure: FIRST STAGE BASILIC VEIN TRANSPOSITION left arm;  Surgeon: Serafina Mitchell, MD;  Location: [facility];  Service: Vascular;  Laterality: Left;  . BASCILIC VEIN TRANSPOSITION Left 11/14/2016   Procedure: BASCILIC VEIN TRANSPOSITION-LEFT ARM 2ND STAGE;  Surgeon: Serafina Mitchell, MD;  Location: [facility];  Service: Vascular;  Laterality: Left;  . COLONOSCOPY    . EYE SURGERY Left 09/11/2017  . PROSTATE SURGERY    . PROSTATECTOMY      Family History  Problem Relation Age of Onset  . CVA Mother   . Hypertension Mother   . Dementia Mother   . Heart attack Father   . Heart disease Father        before age 92  . Hypertension Father   . Hepatitis C Sister   . Dementia Sister     Social History   Socioeconomic History  . Marital status: Married    Spouse name: Altha Harm  . Number of children: 1  . Years of education: Not on file  . Highest education level: 6th grade  Occupational History  . Occupation: Retired   Scientific laboratory technician  . Financial resource strain: Not hard at all  . Food insecurity:    Worry: Never true    Inability: Never true  . Transportation needs:    Medical: No    Non-medical: No  Tobacco Use  . Smoking status: Former Smoker    Packs/day: 0.30    Years: 20.00    Pack years: 6.00    Types: Cigarettes    Last attempt to quit: 08/26/1978    Years since quitting: 40.0  . Smokeless tobacco: Never Used  . Tobacco comment: smoking cessation materials not required  Substance and Sexual  Activity  . Alcohol use: No    Alcohol/week: 0.0 standard drinks  . Drug use: No  . Sexual activity: Not Currently  Lifestyle  . Physical activity:    Days per week: 0 days    Minutes per session: 0 min  . Stress: Not at all  Relationships  . Social connections:    Talks on phone: Patient refused    Gets together: Patient refused    Attends religious service: Patient refused    Active member of club or organization: Patient refused    Attends meetings of clubs or organizations: Patient refused    Relationship status: Married  . Intimate partner violence:    Fear of current or ex partner: No    Emotionally abused: No    Physically abused: No    Forced sexual activity: No  Other Topics Concern  .  Not on file  Social History Narrative   Lives w/ wife   Caffeine use: none   Right handed    He retired 07/2018 because of medical problems     Current Outpatient Medications:  .  amLODipine (NORVASC) 10 MG tablet, Take 1 tablet (10 mg total) by mouth daily., Disp: 90 tablet, Rfl: 1 .  aspirin 81 MG tablet, Take 81 mg by mouth daily. , Disp: , Rfl:  .  atorvastatin (LIPITOR) 40 MG tablet, Take 1 tablet (40 mg total) by mouth every evening., Disp: 90 tablet, Rfl: 3 .  brimonidine (ALPHAGAN) 0.2 % ophthalmic solution, Place 1 drop into both eyes 2 (two) times daily., Disp: , Rfl: 0 .  hydrALAZINE (APRESOLINE) 25 MG tablet, Take 1 tablet (25 mg total) by mouth 2 (two) times daily., Disp: 180 tablet, Rfl: 0 .  Latanoprostene Bunod (VYZULTA) 0.024 % SOLN, Apply 1 drop to eye daily., Disp: , Rfl:  .  Multiple Vitamin (MULTIVITAMIN WITH MINERALS) TABS tablet, Take 1 tablet by mouth daily., Disp: , Rfl:  .  Netarsudil Dimesylate (RHOPRESSA) 0.02 % SOLN, Apply 1 drop to eye daily., Disp: , Rfl:  .  vitamin C (ASCORBIC ACID) 500 MG tablet, Take 500 mg by mouth daily., Disp: , Rfl:  .  Vitamin E 400 UNITS CHEW, Chew 400 Units by mouth daily. , Disp: , Rfl:  .  Apoaequorin (PREVAGEN EXTRA  STRENGTH PO), Take 1 tablet by mouth., Disp: , Rfl:  .  dorzolamide-timolol (COSOPT) 22.3-6.8 MG/ML ophthalmic solution, Place 1 drop into both eyes 2 (two) times daily. , Disp: , Rfl: 1 .  latanoprost (XALATAN) 0.005 % ophthalmic solution, Place 1 drop into both eyes at bedtime. , Disp: , Rfl:   Allergies  Allergen Reactions  . Ace Inhibitors Cough    cough    I personally reviewed active problem list, medication list, allergies, family history, social history with the patient/caregiver today.   ROS  Constitutional: Negative for fever, positive for mild  weight change.  Respiratory: Negative for cough and shortness of breath.   Cardiovascular: Negative for chest pain or palpitations.  Gastrointestinal: Negative for abdominal pain, no bowel changes.  Musculoskeletal: Negative for gait problem or joint swelling.  Skin: Negative for rash.   Neurological: Negative for dizziness or headache.  No other specific complaints in a complete review of systems (except as listed in HPI above).  Objective  Vitals:   09/16/18 1422  BP: (!) 150/80  Pulse: 75  Resp: 16  Temp: 98 F (36.7 C)  TempSrc: Oral  SpO2: 99%  Weight: 178 lb 3.2 oz (80.8 kg)  Height: 6' 2"  (1.88 m)    Body mass index is 22.88 kg/m.  Physical Exam  Constitutional: Patient appears well-developed and thin.  No distress.  HEENT: head atraumatic, normocephalic, mild ptosis of left eye, and has lenses on left eye, pupil not very reactive on the left neck supple, throat within normal limits Cardiovascular: Normal rate, regular rhythm and normal heart sounds.  No murmur heard. No BLE edema. Pulmonary/Chest: Effort normal and breath sounds normal. No respiratory distress. Abdominal: Soft.  There is no tenderness. Psychiatric: Patient has a normal mood and affect. behavior is normal. Judgment and thought content normal.  Recent Results (from the past 2160 hour(s))  Basic metabolic panel     Status: Abnormal    Collection Time: 07/26/18 11:55 PM  Result Value Ref Range   Sodium 133 (L) 135 - 145 mmol/L   Potassium 3.3 (  L) 3.5 - 5.1 mmol/L   Chloride 116 (H) 98 - 111 mmol/L   CO2 13 (L) 22 - 32 mmol/L   Glucose, Bld 144 (H) 70 - 99 mg/dL   BUN 37 (H) 8 - 23 mg/dL   Creatinine, Ser 2.29 (H) 0.61 - 1.24 mg/dL   Calcium 9.2 8.9 - 10.3 mg/dL   GFR calc non Af Amer 25 (L) >60 mL/min   GFR calc Af Amer 28 (L) >60 mL/min    Comment: (NOTE) The eGFR has been calculated using the CKD EPI equation. This calculation has not been validated in all clinical situations. eGFR's persistently <60 mL/min signify possible Chronic Kidney Disease.    Anion gap 4 (L) 5 - 15    Comment: Performed at [facility] [address], [place], [place] [address]  CBC     Status: Abnormal   Collection Time: 07/26/18 11:55 PM  Result Value Ref Range   WBC 4.5 4.0 - 10.5 K/uL   RBC 3.45 (L) 4.22 - 5.81 MIL/uL   Hemoglobin 11.0 (L) 13.0 - 17.0 g/dL   HCT 33.2 (L) 39.0 - 52.0 %   MCV 96.2 80.0 - 100.0 fL   MCH 31.9 26.0 - 34.0 pg   MCHC 33.1 30.0 - 36.0 g/dL   RDW 14.1 11.5 - 15.5 %   Platelets 149 (L) 150 - 400 K/uL   nRBC 0.0 0.0 - 0.2 %    Comment: Performed at [facility], [place] [address] [place], [place] [address]  Troponin I - ONCE - STAT     Status: None   Collection Time: 07/26/18 11:55 PM  Result Value Ref Range   Troponin I <0.03 <0.03 ng/mL    Comment: Performed at [facility], [place] [address], [place], [place] [address]  Urinalysis, Routine w reflex microscopic     Status: Abnormal   Collection Time: 07/27/18  4:01 AM  Result Value Ref Range   Color, Urine YELLOW YELLOW   APPearance CLEAR CLEAR   Specific Gravity, Urine 1.014 1.005 - 1.030   pH 5.0 5.0 - 8.0   Glucose, UA NEGATIVE NEGATIVE mg/dL   Hgb urine dipstick NEGATIVE NEGATIVE   Bilirubin Urine NEGATIVE NEGATIVE   Ketones, ur NEGATIVE NEGATIVE mg/dL   Protein, ur 100 (A) NEGATIVE mg/dL   Nitrite NEGATIVE NEGATIVE    Leukocytes, UA NEGATIVE NEGATIVE   RBC / HPF 0-5 0 - 5 RBC/hpf   WBC, UA 0-5 0 - 5 WBC/hpf   Bacteria, UA NONE SEEN NONE SEEN   Squamous Epithelial / LPF 0-5 0 - 5   Mucus PRESENT     Comment: Performed at [facility] [address], [place], [place] [address]  I-Stat arterial blood gas, ED     Status: Abnormal   Collection Time: 07/27/18  5:13 AM  Result Value Ref Range   pH, Arterial 7.235 (L) 7.350 - 7.450   pCO2 arterial 29.9 (L) 32.0 - 48.0 mmHg   pO2, Arterial 94.0 83.0 - 108.0 mmHg   Bicarbonate 12.7 (L) 20.0 - 28.0 mmol/L   TCO2 14 (L) 22 - 32 mmol/L   O2 Saturation 96.0 %   Acid-base deficit 14.0 (H) 0.0 - 2.0 mmol/L   Patient temperature 98.2 F    Collection site FEMORAL ARTERY    Sample type ARTERIAL   Basic metabolic panel     Status: Abnormal   Collection Time: 07/27/18  5:31 AM  Result Value Ref Range  Sodium 140 135 - 145 mmol/L    Comment: DELTA CHECK NOTED   Potassium 4.0 3.5 - 5.1 mmol/L    Comment: DELTA CHECK NOTED HEMOLYSIS AT THIS LEVEL MAY AFFECT RESULT    Chloride 121 (H) 98 - 111 mmol/L   CO2 13 (L) 22 - 32 mmol/L   Glucose, Bld 111 (H) 70 - 99 mg/dL   BUN 35 (H) 8 - 23 mg/dL   Creatinine, Ser 2.15 (H) 0.61 - 1.24 mg/dL   Calcium 9.1 8.9 - 10.3 mg/dL   GFR calc non Af Amer 27 (L) >60 mL/min   GFR calc Af Amer 31 (L) >60 mL/min    Comment: (NOTE) The eGFR has been calculated using the CKD EPI equation. This calculation has not been validated in all clinical situations. eGFR's persistently <60 mL/min signify possible Chronic Kidney Disease.    Anion gap 6 5 - 15    Comment: Performed at [facility] [address], [place], [place] [address]  Magnesium     Status: None   Collection Time: 07/27/18  5:31 AM  Result Value Ref Range   Magnesium 2.2 1.7 - 2.4 mg/dL    Comment: Performed at [facility] [address], [place], [place] [address]  Troponin I - Once     Status: Abnormal   Collection Time: 07/27/18 12:24 PM   Result Value Ref Range   Troponin I 0.04 (HH) <0.03 ng/mL    Comment: CRITICAL RESULT CALLED TO, READ BACK BY AND VERIFIED WITH: Daleen Squibb 1960 07/27/18 D BRADLEY Performed at [facility], [place] [address], [place], [place] [address]   ECHOCARDIOGRAM COMPLETE     Status: None   Collection Time: 07/27/18  2:54 PM  Result Value Ref Range   Weight 2,927.71 oz   Height 74 in   BP 159/82 mmHg  Blood gas, arterial     Status: Abnormal   Collection Time: 07/28/18  4:25 AM  Result Value Ref Range   FIO2 21.00    pH, Arterial 7.337 (L) 7.350 - 7.450   pCO2 arterial 29.7 (L) 32.0 - 48.0 mmHg   pO2, Arterial 104 83.0 - 108.0 mmHg   Bicarbonate 15.5 (L) 20.0 - 28.0 mmol/L   Acid-base deficit 9.2 (H) 0.0 - 2.0 mmol/L   O2 Saturation 98.1 %   Patient temperature 98.6    Collection site BRACHIAL ARTERY    Drawn by [id number]    Sample type ARTERIAL DRAW    Allens test (pass/fail) PASS PASS  Comprehensive metabolic panel     Status: Abnormal   Collection Time: 07/28/18  4:39 AM  Result Value Ref Range   Sodium 139 135 - 145 mmol/L   Potassium 3.5 3.5 - 5.1 mmol/L   Chloride 118 (H) 98 - 111 mmol/L   CO2 14 (L) 22 - 32 mmol/L   Glucose, Bld 102 (H) 70 - 99 mg/dL   BUN 30 (H) 8 - 23 mg/dL   Creatinine, Ser 2.03 (H) 0.61 - 1.24 mg/dL   Calcium 9.8 8.9 - 10.3 mg/dL   Total Protein 7.0 6.5 - 8.1 g/dL   Albumin 3.6 3.5 - 5.0 g/dL   AST 24 15 - 41 U/L   ALT 14 0 - 44 U/L   Alkaline Phosphatase 54 38 - 126 U/L   Total Bilirubin 1.0 0.3 - 1.2 mg/dL   GFR calc non Af Amer 28 (L) >60 mL/min   GFR calc Af Amer 33 (L) >60  mL/min    Comment: (NOTE) The eGFR has been calculated using the CKD EPI equation. This calculation has not been validated in all clinical situations. eGFR's persistently <60 mL/min signify possible Chronic Kidney Disease.    Anion gap 7 5 - 15    Comment: Performed at [facility] [address] [place], [place] [address]  Glucose, capillary     Status: None    Collection Time: 07/28/18  7:49 AM  Result Value Ref Range   Glucose-Capillary 90 70 - 99 mg/dL  Vitamin B12     Status: None   Collection Time: 07/28/18  4:24 PM  Result Value Ref Range   Vitamin B-12 452 180 - 914 pg/mL    Comment: (NOTE) This assay is not validated for testing neonatal or myeloproliferative syndrome specimens for Vitamin B12 levels. Performed at [facility], [place] [address] [place], [place] [address]   TSH     Status: None   Collection Time: 07/28/18  4:24 PM  Result Value Ref Range   TSH 3.194 0.350 - 4.500 uIU/mL    Comment: Performed by a 3rd Generation assay with a functional sensitivity of <=0.01 uIU/mL. Performed at [facility], [place] [address] [place], [place] [address]   Ammonia     Status: None   Collection Time: 07/29/18  3:46 AM  Result Value Ref Range   Ammonia 13 9 - 35 umol/L    Comment: Performed at [facility], [place] [address], [place], [place] [address]  Comprehensive metabolic panel     Status: Abnormal   Collection Time: 07/29/18  3:46 AM  Result Value Ref Range   Sodium 141 135 - 145 mmol/L   Potassium 3.9 3.5 - 5.1 mmol/L   Chloride 116 (H) 98 - 111 mmol/L   CO2 19 (L) 22 - 32 mmol/L   Glucose, Bld 110 (H) 70 - 99 mg/dL   BUN 31 (H) 8 - 23 mg/dL   Creatinine, Ser 2.14 (H) 0.61 - 1.24 mg/dL   Calcium 10.0 8.9 - 10.3 mg/dL   Total Protein 7.3 6.5 - 8.1 g/dL   Albumin 3.5 3.5 - 5.0 g/dL   AST 29 15 - 41 U/L   ALT 18 0 - 44 U/L   Alkaline Phosphatase 58 38 - 126 U/L   Total Bilirubin 0.7 0.3 - 1.2 mg/dL   GFR calc non Af Amer 27 (L) >60 mL/min   GFR calc Af Amer 31 (L) >60 mL/min    Comment: (NOTE) The eGFR has been calculated using the CKD EPI equation. This calculation has not been validated in all clinical situations. eGFR's persistently <60 mL/min signify possible Chronic Kidney Disease.    Anion gap 6 5 - 15    Comment: Performed at [facility] [address] [place], [place] [address]   Glucose, capillary     Status: None   Collection Time: 07/29/18  7:33 AM  Result Value Ref Range   Glucose-Capillary 99 70 - 99 mg/dL  Basic metabolic panel     Status: Abnormal   Collection Time: 07/30/18  3:16 AM  Result Value Ref Range   Sodium 137 135 - 145 mmol/L   Potassium 3.4 (L) 3.5 - 5.1 mmol/L   Chloride 111 98 - 111 mmol/L   CO2 22 22 - 32 mmol/L   Glucose, Bld 101 (H) 70 - 99 mg/dL   BUN 31 (H) 8 - 23 mg/dL   Creatinine, Ser  2.20 (H) 0.61 - 1.24 mg/dL   Calcium 9.4 8.9 - 10.3 mg/dL   GFR calc non Af Amer 26 (L) >60 mL/min   GFR calc Af Amer 30 (L) >60 mL/min    Comment: (NOTE) The eGFR has been calculated using the CKD EPI equation. This calculation has not been validated in all clinical situations. eGFR's persistently <60 mL/min signify possible Chronic Kidney Disease.    Anion gap 4 (L) 5 - 15    Comment: Performed at [facility] [address] [place], [place] [address]      PHQ2/9: Depression screen [facility] 2/9 09/16/2018 08/07/2018 06/08/2018 04/09/2018 03/04/2018  Decreased Interest 0 3 0 0 0  Down, Depressed, Hopeless 0 2 0 0 0  PHQ - 2 Score 0 5 0 0 0  Altered sleeping 0 3 0 0 -  Tired, decreased energy 0 3 0 0 -  Change in appetite 3 3 0 0 -  Feeling bad or failure about yourself  3 0 0 0 -  Trouble concentrating 0 0 0 0 -  Moving slowly or fidgety/restless 0 0 0 0 -  Suicidal thoughts 0 0 0 0 -  PHQ-9 Score 6 14 0 0 -  Difficult doing work/chores Somewhat difficult Very difficult Not difficult at all Not difficult at all -    Fall Risk: Fall Risk  09/16/2018 08/07/2018 06/08/2018 04/09/2018 03/04/2018  Falls in the past year? 0 1 No No No  Number falls in past yr: 0 0 - - -  Injury with Fall? 0 1 - - -  Risk for fall due to : - - - Impaired vision -  Risk for fall due to: Comment - - - wears eyeglasses -     Assessment & Plan  1. Benign essential HTN  Wife will contact nephrologist and ask above valsartan   2. Stage 3 chronic kidney  disease ([place])   3. Pulmonary hypertension (HCC)  Not on medication   4. Poor appetite  Discussed increase in calorie intake  5. Mild major depression ([place])  Doing better now, off medication, wife stopped it   6. Prostate cancer [facility])  Keep follow up with urologist   7. Secondary hyperparathyroidism of renal origin ([place])   8. PVD (peripheral vascular disease) (HCC)  stable  9. Mild protein-calorie malnutrition ([place])

## 2018-09-25 ENCOUNTER — Encounter: Payer: Self-pay | Admitting: Family Medicine

## 2018-10-07 ENCOUNTER — Other Ambulatory Visit: Payer: Self-pay | Admitting: Family Medicine

## 2018-10-07 DIAGNOSIS — I10 Essential (primary) hypertension: Secondary | ICD-10-CM

## 2018-10-07 NOTE — Telephone Encounter (Signed)
Copied from [place] [phone]. Topic: Quick Communication - Rx Refill/Question >> Oct 07, 2018  5:39 PM Windy Kalata wrote: Medication: hydrALAZINE (APRESOLINE) 25 MG tablet  Has the patient contacted their pharmacy? Yes.   (Agent: If no, request that the patient contact the pharmacy for the refill.) (Agent: If yes, when and what did the pharmacy advise?) Call office  Preferred Pharmacy (with phone number or street name): [facility], [place] [phone] (Phone) [phone] (Fax)    Agent: Please be advised that RX refills may take up to 3 business days. We ask that you follow-up with your pharmacy.

## 2018-10-08 MED ORDER — HYDRALAZINE HCL 25 MG PO TABS: 25.0000 mg | ORAL_TABLET | Freq: Two times a day (BID) | ORAL | 1 refills | Status: DC

## 2018-12-30 ENCOUNTER — Other Ambulatory Visit: Payer: Self-pay | Admitting: Family Medicine

## 2018-12-30 DIAGNOSIS — I10 Essential (primary) hypertension: Secondary | ICD-10-CM

## 2018-12-30 DIAGNOSIS — E785 Hyperlipidemia, unspecified: Secondary | ICD-10-CM

## 2019-01-12 DIAGNOSIS — H401112 Primary open-angle glaucoma, right eye, moderate stage: Secondary | ICD-10-CM | POA: Diagnosis not present

## 2019-01-12 DIAGNOSIS — H401123 Primary open-angle glaucoma, left eye, severe stage: Secondary | ICD-10-CM | POA: Diagnosis not present

## 2019-01-19 ENCOUNTER — Encounter: Payer: Self-pay | Admitting: Family Medicine

## 2019-01-19 ENCOUNTER — Ambulatory Visit (INDEPENDENT_AMBULATORY_CARE_PROVIDER_SITE_OTHER): Payer: Medicare HMO | Admitting: Family Medicine

## 2019-01-19 ENCOUNTER — Other Ambulatory Visit: Payer: Self-pay

## 2019-01-19 VITALS — BP 158/75 | HR 57 | Wt 179.0 lb

## 2019-01-19 DIAGNOSIS — N183 Chronic kidney disease, stage 3 unspecified: Secondary | ICD-10-CM

## 2019-01-19 DIAGNOSIS — F320 Major depressive disorder, single episode, mild: Secondary | ICD-10-CM | POA: Diagnosis not present

## 2019-01-19 DIAGNOSIS — I129 Hypertensive chronic kidney disease with stage 1 through stage 4 chronic kidney disease, or unspecified chronic kidney disease: Secondary | ICD-10-CM

## 2019-01-19 DIAGNOSIS — D696 Thrombocytopenia, unspecified: Secondary | ICD-10-CM

## 2019-01-19 DIAGNOSIS — E785 Hyperlipidemia, unspecified: Secondary | ICD-10-CM

## 2019-01-19 DIAGNOSIS — I272 Pulmonary hypertension, unspecified: Secondary | ICD-10-CM

## 2019-01-19 DIAGNOSIS — R6889 Other general symptoms and signs: Secondary | ICD-10-CM

## 2019-01-19 MED ORDER — HYDRALAZINE HCL 25 MG PO TABS: 25.0000 mg | ORAL_TABLET | Freq: Two times a day (BID) | ORAL | 0 refills | Status: DC

## 2019-01-19 MED ORDER — AMLODIPINE BESYLATE 10 MG PO TABS: 10.0000 mg | ORAL_TABLET | Freq: Every day | ORAL | 0 refills | Status: DC

## 2019-01-19 MED ORDER — ATORVASTATIN CALCIUM 40 MG PO TABS: 40.0000 mg | ORAL_TABLET | Freq: Every evening | ORAL | 0 refills | Status: DC

## 2019-01-19 NOTE — Progress Notes (Signed)
Name: Patrick [name].   MRN: [id number]    DOB: 25-Nov-1931   Date:01/19/2019       Progress Note  Subjective  Chief Complaint  Chief Complaint  Patient presents with  . Hypertension  . Dementia  . Dyslipidemia    I connected with  [name].  on 01/19/19 at  1:20 PM EDT by a video enabled telemedicine application and verified that I am speaking with the correct person using two identifiers.  I discussed the limitations of evaluation and management by telemedicine and the availability of in person appointments. The patient expressed understanding and agreed to proceed. Staff also discussed with the patient that there may be a patient responsible charge related to this service. Patient Location: at home  Provider Location: [facility] Additional Individuals present: wife  HPI  HTN: Hewas onValsartan, Norvasc and Hydralazine BID, he has been off Valsartan since hospital stay 07/2018, bp has been in the 130s at home but today it was 150  We reviewed labs done by nephrologist 4 months ago.   Cold Intolerance: wife is concerned because for about 6 months he is always cold, needs to keep house at 77 degrees to keep him comfortable. She asked for labs   Angina:no recent episodes of chest pain, no SOB,he denies orthopnea. he denies chest pain   Prostate cancer : he has multiple bone scans and negative for metastasis No longer has bone pain.Symptoms were severe 2017, since he has been off Lupron. No longer has bone pain, he was recently seen by Dr Rosana Hoes and he is only going to resume Lupron if PSA goes above 10  Pulmonary hypertension: discussed Revatio therapy, but wife and patient are worried about possible side effects. Unchanged   CKI stageII continue follow up with nephrologist. - [facility] , fistula placed on left upper arm back in 11/2016 and is doing well, has not started HD yet, his kidney went from stage IV to stage III, labs recheck  Monday but results not back yet for review  Dyslipidemia: still taking Atorvastatin ,compliant with medication, denies side effects. Last LDL was 56. No side effects   Dementia: wife states symptoms are stable, slightly forgetful, wife is now dispensing his medications. Still able to drive to work and back daily.. At work he is also getting a little forgetful . MMS 17 first time, refuses to recheck, CIT 6 done recently also abnormal.On Aricept 10 mg Fall 2018, seen by Neurologist since and normal RPR, B12 and CRP. Wife stopped giving him Aricept because it was not working, he is not longer working - had a syncopal episode Fall 2019. Unchanged   Glaucoma: he is having blurred vision left eye, had surgery and is using eye drops, he has not been driving.   PVD: swelling resolved, has some cramping, but not associated with activity, wearing compression stocking hoses and is doing well   Malnutrition: his weight has gone down from 220 lbs average to 182 lbs today, last visit it was 178 lbs stable today at 179 lbs. She has temporal waisting , eats very little, he states not depressed, he has not been taking protein shakes because of taste. He states his appetite has improved   Major Depression in remission: doing better, phq 9 is normal today. Wife stopped giving him zoloft   Patient Active Problem List   Diagnosis Date Noted  . Metabolic acidosis, normal anion gap (NAG) 07/27/2018  . Dementia without behavioral disturbance ([place])  07/27/2018  . Glaucoma 07/27/2018  . Syncope 07/27/2018  . Secondary hyperparathyroidism ([place]) 11/26/2016  . Mild cognitive impairment 01/03/2016  . Elevated prostate specific antigen (PSA) 09/08/2015  . Bone pain 06/13/2015  . Spinal stenosis 06/13/2015  . OP (osteoporosis) 03/20/2015  . Allergic rhinitis 03/20/2015  . Lumbar canal stenosis 03/20/2015  . Vitamin D deficiency 03/20/2015  . Restless legs syndrome 03/20/2015  . Drug-induced gynecomastia  03/20/2015  . Diaphragmatic hernia 03/20/2015  . Acquired cyst of kidney 03/20/2015  . Left ventricular hypertrophy by electrocardiogram 03/20/2015  . Leg pain 03/20/2015  . Chronic kidney disease, stage IV (severe) ([place]) 03/20/2015  . History of pneumonia 03/20/2015  . Non-rheumatic tricuspid valve insufficiency 03/20/2015  . Dysmetabolic syndrome 72/62/0355  . Pulmonary hypertension ([place]) 10/31/2014  . Benign essential HTN 07/29/2014  . Dyslipidemia 07/29/2014  . PVD (peripheral vascular disease) ([place])   . Anemia of chronic disease   . IBS (irritable bowel syndrome)   . Bradycardia   . ED (erectile dysfunction) of organic origin 06/29/2012  . Genuine stress incontinence, male 06/29/2012  . Malignant neoplasm of prostate ([place]) 06/29/2012    Past Surgical History:  Procedure Laterality Date  . BASCILIC VEIN TRANSPOSITION Left 09/05/2016   Procedure: FIRST STAGE BASILIC VEIN TRANSPOSITION left arm;  Surgeon: Serafina Mitchell, MD;  Location: [place];  Service: Vascular;  Laterality: Left;  . BASCILIC VEIN TRANSPOSITION Left 11/14/2016   Procedure: BASCILIC VEIN TRANSPOSITION-LEFT ARM 2ND STAGE;  Surgeon: Serafina Mitchell, MD;  Location: [place];  Service: Vascular;  Laterality: Left;  . COLONOSCOPY    . EYE SURGERY Left 09/11/2017  . PROSTATE SURGERY    . PROSTATECTOMY      Family History  Problem Relation Age of Onset  . CVA Mother   . Hypertension Mother   . Dementia Mother   . Heart attack Father   . Heart disease Father        before age 72  . Hypertension Father   . Hepatitis C Sister   . Dementia Sister     Social History   Socioeconomic History  . Marital status: Married    Spouse name: Altha Harm  . Number of children: 1  . Years of education: Not on file  . Highest education level: 6th grade  Occupational History  . Occupation: Retired   Scientific laboratory technician  . Financial resource strain: Not hard at all  . Food insecurity:    Worry: Never true    Inability: Never  true  . Transportation needs:    Medical: No    Non-medical: No  Tobacco Use  . Smoking status: Former Smoker    Packs/day: 0.30    Years: 20.00    Pack years: 6.00    Types: Cigarettes    Last attempt to quit: 08/26/1978    Years since quitting: 40.4  . Smokeless tobacco: Never Used  . Tobacco comment: smoking cessation materials not required  Substance and Sexual Activity  . Alcohol use: No    Alcohol/week: 0.0 standard drinks  . Drug use: No  . Sexual activity: Not Currently  Lifestyle  . Physical activity:    Days per week: 0 days    Minutes per session: 0 min  . Stress: Not at all  Relationships  . Social connections:    Talks on phone: Patient refused    Gets together: Patient refused    Attends religious service: Patient refused    Active member of club or organization: Patient refused  Attends meetings of clubs or organizations: Patient refused    Relationship status: Married  . Intimate partner violence:    Fear of current or ex partner: No    Emotionally abused: No    Physically abused: No    Forced sexual activity: No  Other Topics Concern  . Not on file  Social History Narrative   Lives w/ wife   Caffeine use: none   Right handed    He retired 07/2018 because of medical problems     Current Outpatient Medications:  .  amLODipine (NORVASC) 10 MG tablet, Take 1 tablet (10 mg total) by mouth daily., Disp: 90 tablet, Rfl: 0 .  Apoaequorin (PREVAGEN EXTRA STRENGTH PO), Take 1 tablet by mouth., Disp: , Rfl:  .  aspirin 81 MG tablet, Take 81 mg by mouth daily. , Disp: , Rfl:  .  atorvastatin (LIPITOR) 40 MG tablet, Take 1 tablet (40 mg total) by mouth every evening., Disp: 90 tablet, Rfl: 0 .  brimonidine (ALPHAGAN) 0.2 % ophthalmic solution, Place 1 drop into both eyes 2 (two) times daily., Disp: , Rfl: 0 .  dorzolamide-timolol (COSOPT) 22.3-6.8 MG/ML ophthalmic solution, Place 1 drop into both eyes 2 (two) times daily. , Disp: , Rfl: 1 .  hydrALAZINE  (APRESOLINE) 25 MG tablet, Take 1 tablet (25 mg total) by mouth 2 (two) times daily., Disp: 180 tablet, Rfl: 0 .  latanoprost (XALATAN) 0.005 % ophthalmic solution, Place 1 drop into both eyes at bedtime. , Disp: , Rfl:  .  Latanoprostene Bunod (VYZULTA) 0.024 % SOLN, Apply 1 drop to eye daily., Disp: , Rfl:  .  Multiple Vitamin (MULTIVITAMIN WITH MINERALS) TABS tablet, Take 1 tablet by mouth daily., Disp: , Rfl:  .  Netarsudil Dimesylate (RHOPRESSA) 0.02 % SOLN, Apply 1 drop to eye daily., Disp: , Rfl:  .  prednisoLONE acetate (PRED FORTE) 1 % ophthalmic suspension, , Disp: , Rfl:  .  vitamin C (ASCORBIC ACID) 500 MG tablet, Take 500 mg by mouth daily., Disp: , Rfl:  .  Vitamin E 400 UNITS CHEW, Chew 400 Units by mouth daily. , Disp: , Rfl:   Allergies  Allergen Reactions  . Ace Inhibitors Cough    cough    I personally reviewed active problem list, medication list, allergies, family history with the patient/caregiver today.   ROS  Ten systems reviewed and is negative except as mentioned in HPI  Objective  Virtual encounter, vitals obtained at home.  Body mass index is 22.98 kg/m.  Physical Exam  Awake, alert and oriented   PHQ2/9: Depression screen [facility] 2/9 01/19/2019 09/16/2018 08/07/2018 06/08/2018 04/09/2018  Decreased Interest 0 0 3 0 0  Down, Depressed, Hopeless 0 0 2 0 0  PHQ - 2 Score 0 0 5 0 0  Altered sleeping 0 0 3 0 0  Tired, decreased energy 0 0 3 0 0  Change in appetite 0 3 3 0 0  Feeling bad or failure about yourself  0 3 0 0 0  Trouble concentrating 0 0 0 0 0  Moving slowly or fidgety/restless 0 0 0 0 0  Suicidal thoughts 0 0 0 0 0  PHQ-9 Score 0 6 14 0 0  Difficult doing work/chores - Somewhat difficult Very difficult Not difficult at all Not difficult at all   PHQ-2/9 Result is negative.    Fall Risk: Fall Risk  01/19/2019 09/16/2018 08/07/2018 06/08/2018 04/09/2018  Falls in the past year? 0 0 1 No No  Number falls  in past yr: 0 0 0 - -  Injury with Fall? 0  0 1 - -  Risk for fall due to : - - - - Impaired vision  Risk for fall due to: Comment - - - - wears eyeglasses     Assessment & Plan  1. Cold intolerance  - CBC with Differential/Platelet - TSH  2. Thrombocytopenia (HCC)  - CBC with Differential/Platelet  3. Pulmonary hypertension (HCC)  Denies SOB  4. Mild major depression in remission  [facility])  Doing well at this time  5. Stage III  chronic kidney disease (HCC)  - COMPLETE METABOLIC PANEL WITH GFR - Phosphorus  6. Benign hypertension with CKD (chronic kidney disease) stage III (HCC)  - amLODipine (NORVASC) 10 MG tablet; Take 1 tablet (10 mg total) by mouth daily.  Dispense: 90 tablet; Refill: 0 - hydrALAZINE (APRESOLINE) 25 MG tablet; Take 1 tablet (25 mg total) by mouth 2 (two) times daily.  Dispense: 180 tablet; Refill: 0  7. Dyslipidemia  - atorvastatin (LIPITOR) 40 MG tablet; Take 1 tablet (40 mg total) by mouth every evening.  Dispense: 90 tablet; Refill: 0  I discussed the assessment and treatment plan with the patient. The patient was provided an opportunity to ask questions and all were answered. The patient agreed with the plan and demonstrated an understanding of the instructions.  The patient was advised to call back or seek an in-person evaluation if the symptoms worsen or if the condition fails to improve as anticipated.  I provided 25 minutes of non-face-to-face time during this encounter.

## 2019-01-25 DIAGNOSIS — D696 Thrombocytopenia, unspecified: Secondary | ICD-10-CM | POA: Diagnosis not present

## 2019-01-25 DIAGNOSIS — N184 Chronic kidney disease, stage 4 (severe): Secondary | ICD-10-CM | POA: Diagnosis not present

## 2019-01-25 DIAGNOSIS — R6889 Other general symptoms and signs: Secondary | ICD-10-CM | POA: Diagnosis not present

## 2019-01-25 LAB — CBC WITH DIFFERENTIAL/PLATELET
Absolute Monocytes: 356 cells/uL (ref 200–950)
Basophils Absolute: 18 cells/uL (ref 0–200)
Basophils Relative: 0.5 %
Eosinophils Absolute: 158 cells/uL (ref 15–500)
Eosinophils Relative: 4.4 %
HCT: 33.6 % — ABNORMAL LOW (ref 38.5–50.0)
Hemoglobin: 11.3 g/dL — ABNORMAL LOW (ref 13.2–17.1)
Lymphs Abs: 1087 cells/uL (ref 850–3900)
MCH: 31.8 pg (ref 27.0–33.0)
MCHC: 33.6 g/dL (ref 32.0–36.0)
MCV: 94.6 fL (ref 80.0–100.0)
MPV: 11.2 fL (ref 7.5–12.5)
Monocytes Relative: 9.9 %
Neutro Abs: 1980 cells/uL (ref 1500–7800)
Neutrophils Relative %: 55 %
Platelets: 163 10*3/uL (ref 140–400)
RBC: 3.55 10*6/uL — ABNORMAL LOW (ref 4.20–5.80)
RDW: 12 % (ref 11.0–15.0)
Total Lymphocyte: 30.2 %
WBC: 3.6 10*3/uL — ABNORMAL LOW (ref 3.8–10.8)

## 2019-01-25 LAB — COMPLETE METABOLIC PANEL WITH GFR
AG Ratio: 1.2 (calc) (ref 1.0–2.5)
ALT: 10 U/L (ref 9–46)
AST: 11 U/L (ref 10–35)
Albumin: 3.9 g/dL (ref 3.6–5.1)
Alkaline phosphatase (APISO): 67 U/L (ref 35–144)
BUN/Creatinine Ratio: 17 (calc) (ref 6–22)
BUN: 39 mg/dL — ABNORMAL HIGH (ref 7–25)
CO2: 26 mmol/L (ref 20–32)
Calcium: 9.8 mg/dL (ref 8.6–10.3)
Chloride: 112 mmol/L — ABNORMAL HIGH (ref 98–110)
Creat: 2.35 mg/dL — ABNORMAL HIGH (ref 0.70–1.11)
GFR, Est African American: 28 mL/min/{1.73_m2} — ABNORMAL LOW (ref >=60)
GFR, Est Non African American: 24 mL/min/{1.73_m2} — ABNORMAL LOW (ref >=60)
Globulin: 3.2 g/dL (calc) (ref 1.9–3.7)
Glucose, Bld: 98 mg/dL (ref 65–99)
Potassium: 4.2 mmol/L (ref 3.5–5.3)
Sodium: 142 mmol/L (ref 135–146)
Total Bilirubin: 0.6 mg/dL (ref 0.2–1.2)
Total Protein: 7.1 g/dL (ref 6.1–8.1)

## 2019-01-25 LAB — PHOSPHORUS: Phosphorus: 3.1 mg/dL (ref 2.1–4.3)

## 2019-01-25 LAB — TSH: TSH: 2.38 mIU/L (ref 0.40–4.50)

## 2019-02-18 ENCOUNTER — Telehealth: Payer: Self-pay | Admitting: Family Medicine

## 2019-02-18 NOTE — Chronic Care Management (AMB) (Deleted)
°  Chronic Care Management   Outreach Note  02/18/2019 Name: Patrick [name]. MRN: [id number] DOB: [birth year]  Referred by: Steele Sizer, MD Reason for referral : No chief complaint on file.   An unsuccessful telephone outreach was attempted today. The patient was referred to the case management team by for assistance with chronic care management and care coordination.   Follow Up Plan: A HIPPA compliant phone message was left for the patient providing contact information and requesting a return call.  The care management team will reach out to the patient again over the next 7 days.  If patient returns call to provider office, please advise to call [place] at [place]  [email or web address]   ??[phone]

## 2019-02-19 DIAGNOSIS — R9721 Rising PSA following treatment for malignant neoplasm of prostate: Secondary | ICD-10-CM | POA: Diagnosis not present

## 2019-02-23 NOTE — Chronic Care Management (AMB) (Deleted)
°  Chronic Care Management   Outreach Note  02/23/2019 Name: Patrick [name]. MRN: [id number] DOB: Feb 09, 1932  Referred by: Steele Sizer, MD Reason for referral : Chronic Care Management (Initial CCM outreach was unsuccessful.) and Chronic Care Management (Second CCM outreach call was unsuccessful.)   A second unsuccessful telephone outreach was attempted today. The patient was referred to the case management team for assistance with chronic care management and care coordination.   Follow Up Plan: A HIPPA compliant phone message was left for the patient providing contact information and requesting a return call.  The care management team will reach out to the patient again over the next 7 days.  If patient returns call to provider office, please advise to call [place] at [place]  [email or web address]   ??[phone]

## 2019-02-24 NOTE — Chronic Care Management (AMB) (Signed)
Chronic Care Management   Note  02/24/2019 Name: Patrick [name]. MRN: [id number] DOB: [birth year]  Hulda Marin Phoenix Dresser. is a 85 y.o. year old male who is a primary care patient of Steele Sizer, MD. I reached out to [employer]. by phone today in response to a referral sent by Mr. Burns Spain Jr.'s health plan.    Mr. Madero was given information about Chronic Care Management services today including:  1. CCM service includes personalized support from designated clinical staff supervised by his physician, including individualized plan of care and coordination with other care providers 2. 24/7 contact phone numbers for assistance for urgent and routine care needs. 3. Service will only be billed when office clinical staff spend 20 minutes or more in a month to coordinate care. 4. Only one practitioner may furnish and bill the service in a calendar month. 5. The patient may stop CCM services at any time (effective at the end of the month) by phone call to the office staff. 6. The patient will be responsible for cost sharing (co-pay) of up to 20% of the service fee (after annual deductible is met).  Patients wife Altha Harm did not agree to enrollment in care management services and does not wish to consider at this time.  Follow up plan: Altha Harm has been provided with contact information for the chronic care management team and has been advised to call with any health related questions or concerns.   [place]  [email or web address]   ??[phone]

## 2019-02-25 DIAGNOSIS — R972 Elevated prostate specific antigen [PSA]: Secondary | ICD-10-CM | POA: Diagnosis not present

## 2019-02-25 DIAGNOSIS — C61 Malignant neoplasm of prostate: Secondary | ICD-10-CM | POA: Diagnosis not present

## 2019-03-17 DIAGNOSIS — I5030 Unspecified diastolic (congestive) heart failure: Secondary | ICD-10-CM | POA: Diagnosis not present

## 2019-03-17 DIAGNOSIS — N184 Chronic kidney disease, stage 4 (severe): Secondary | ICD-10-CM | POA: Diagnosis not present

## 2019-03-17 DIAGNOSIS — N2581 Secondary hyperparathyroidism of renal origin: Secondary | ICD-10-CM | POA: Diagnosis not present

## 2019-03-17 DIAGNOSIS — I739 Peripheral vascular disease, unspecified: Secondary | ICD-10-CM | POA: Diagnosis not present

## 2019-03-17 DIAGNOSIS — C61 Malignant neoplasm of prostate: Secondary | ICD-10-CM | POA: Diagnosis not present

## 2019-03-17 DIAGNOSIS — N189 Chronic kidney disease, unspecified: Secondary | ICD-10-CM | POA: Diagnosis not present

## 2019-03-17 DIAGNOSIS — K589 Irritable bowel syndrome without diarrhea: Secondary | ICD-10-CM | POA: Diagnosis not present

## 2019-03-17 DIAGNOSIS — D631 Anemia in chronic kidney disease: Secondary | ICD-10-CM | POA: Diagnosis not present

## 2019-03-17 DIAGNOSIS — E785 Hyperlipidemia, unspecified: Secondary | ICD-10-CM | POA: Diagnosis not present

## 2019-03-17 DIAGNOSIS — I129 Hypertensive chronic kidney disease with stage 1 through stage 4 chronic kidney disease, or unspecified chronic kidney disease: Secondary | ICD-10-CM | POA: Diagnosis not present

## 2019-03-29 ENCOUNTER — Encounter: Payer: Self-pay | Admitting: Family Medicine

## 2019-04-05 ENCOUNTER — Other Ambulatory Visit: Payer: Self-pay

## 2019-04-05 ENCOUNTER — Ambulatory Visit (INDEPENDENT_AMBULATORY_CARE_PROVIDER_SITE_OTHER): Payer: Medicare HMO | Admitting: Cardiology

## 2019-04-05 ENCOUNTER — Encounter: Payer: Self-pay | Admitting: Cardiology

## 2019-04-05 VITALS — BP 140/62 | HR 64 | Ht 74.0 in | Wt 179.8 lb

## 2019-04-05 DIAGNOSIS — I10 Essential (primary) hypertension: Secondary | ICD-10-CM

## 2019-04-05 DIAGNOSIS — M79605 Pain in left leg: Secondary | ICD-10-CM | POA: Diagnosis not present

## 2019-04-05 DIAGNOSIS — M79604 Pain in right leg: Secondary | ICD-10-CM

## 2019-04-05 DIAGNOSIS — N183 Chronic kidney disease, stage 3 unspecified: Secondary | ICD-10-CM

## 2019-04-05 DIAGNOSIS — I272 Pulmonary hypertension, unspecified: Secondary | ICD-10-CM

## 2019-04-05 NOTE — Patient Instructions (Signed)
Medication Instructions:  Your provider recommends that you continue on your current medications as directed. Please refer to the Current Medication list given to you today.    Labwork: None  Testing/Procedures: Your provider has requested that you have an echocardiogram in November 2020. Echocardiography is a painless test that uses sound waves to create images of your heart. It provides your doctor with information about the size and shape of your heart and how well your heart's chambers and valves are working. This procedure takes approximately one hour. There are no restrictions for this procedure.    Your provider has requested that you have a lower extremity arterial duplex. During this test, ultrasound is used to evaluate arterial blood flow in the legs. Allow one hour for this exam. There are no restrictions or special instructions.   Follow-Up: Your provider wants you to follow-up in: 1 year with Dr. Radford Pax. You will receive a reminder letter in the mail two months in advance. If you don't receive a letter, please call our office to schedule the follow-up appointment.    Any Other Special Instructions Will Be Listed Below (If Applicable).     If you need a refill on your cardiac medications before your next appointment, please call your pharmacy.

## 2019-04-05 NOTE — Progress Notes (Signed)
Cardiology Office Note:    Date:  04/05/2019   ID:  Patrick [name]., DOB April 03, 1932, MRN [id number]  PCP:  Steele Sizer, MD  Cardiologist:  No primary care provider on file.    Referring MD: Steele Sizer, MD   Chief Complaint  Patient presents with  . Follow-up    HTN, pulmonary hTN,     History of Present Illness:    Patrick [name]. is a 85 y.o. male with a hx of HTN, PVD, CKD stage III status post AV fistula placement followed by nephrology but not started HD yet.  He also has a history of moderate pulmonary HTN (PASP 1mmHg on echo 2015) and dyslipidemia.  He has a chronically abnormal EKG with a normal Myoview in 2015.  He has chronic lower extremity edema which is controlled with compression hose.  He has been having progressive dementia symptoms placed on Aricept by neurology.  Chest CT showed no acute findings except mild generalized cortical atrophy normal for his age with mild chronic microvascular ischemic changes.  he is here today for followup and is doing well.  He denies any chest pain or pressure, SOB, DOE, PND, orthopnea,  dizziness, palpitations or syncope. He has chronic LE edema that is controlled with TED hose stockings.  He has been complaining of numbness and pain in the bottom of his feet and his wife thinks he has PAD.  He is compliant with his meds and is tolerating meds with no SE.    Past Medical History:  Diagnosis Date  . Anemia   . Arthritis   . Bradycardia   . Chronic kidney disease   . CKD (chronic kidney disease), stage IV ([place])   . GERD (gastroesophageal reflux disease)   . High cholesterol   . Hypertension   . IBS (irritable bowel syndrome)   . Pneumonia   . Prostate cancer ([place])   . Pulmonary HTN ([place]) 11/05/2015   68mmHg on echo 2017  . PVD (peripheral vascular disease) ([place])   . Syncope 07/2018    Past Surgical History:  Procedure Laterality Date  . BASCILIC VEIN TRANSPOSITION Left 09/05/2016   Procedure: FIRST STAGE  BASILIC VEIN TRANSPOSITION left arm;  Surgeon: Serafina Mitchell, MD;  Location: [place];  Service: Vascular;  Laterality: Left;  . BASCILIC VEIN TRANSPOSITION Left 11/14/2016   Procedure: BASCILIC VEIN TRANSPOSITION-LEFT ARM 2ND STAGE;  Surgeon: Serafina Mitchell, MD;  Location: [place];  Service: Vascular;  Laterality: Left;  . COLONOSCOPY    . EYE SURGERY Left 09/11/2017  . PROSTATE SURGERY    . PROSTATECTOMY      Current Medications: Current Meds  Medication Sig  . amLODipine (NORVASC) 10 MG tablet Take 1 tablet (10 mg total) by mouth daily.  [name] Kitchen aspirin 81 MG tablet Take 81 mg by mouth daily.   [name] Kitchen atorvastatin (LIPITOR) 40 MG tablet Take 1 tablet (40 mg total) by mouth every evening.  . brimonidine (ALPHAGAN) 0.2 % ophthalmic solution Place 1 drop into both eyes 2 (two) times daily.  . dorzolamide-timolol (COSOPT) 22.3-6.8 MG/ML ophthalmic solution Place 1 drop into both eyes 2 (two) times daily.   . hydrALAZINE (APRESOLINE) 25 MG tablet Take 1 tablet (25 mg total) by mouth 2 (two) times daily.  . Latanoprostene Bunod (VYZULTA) 0.024 % SOLN Place 1 drop into the left eye at bedtime.  Mckinley Jewel Dimesylate (RHOPRESSA) 0.02 % SOLN Place 1 drop into the left eye at bedtime.  . vitamin C (ASCORBIC  ACID) 500 MG tablet Take 500 mg by mouth daily.  . Vitamin E 400 UNITS CHEW Chew 400 Units by mouth daily.      Allergies:   Ace inhibitors   Social History   Socioeconomic History  . Marital status: Married    Spouse name: Altha Harm  . Number of children: 1  . Years of education: Not on file  . Highest education level: 6th grade  Occupational History  . Occupation: Retired   Scientific laboratory technician  . Financial resource strain: Not hard at all  . Food insecurity    Worry: Never true    Inability: Never true  . Transportation needs    Medical: No    Non-medical: No  Tobacco Use  . Smoking status: Former Smoker    Packs/day: 0.30    Years: 20.00    Pack years: 6.00    Types: Cigarettes    Quit  date: 08/26/1978    Years since quitting: 40.6  . Smokeless tobacco: Never Used  . Tobacco comment: smoking cessation materials not required  Substance and Sexual Activity  . Alcohol use: No    Alcohol/week: 0.0 standard drinks  . Drug use: No  . Sexual activity: Not Currently  Lifestyle  . Physical activity    Days per week: 0 days    Minutes per session: 0 min  . Stress: Not at all  Relationships  . Social Herbalist on phone: Patient refused    Gets together: Patient refused    Attends religious service: Patient refused    Active member of club or organization: Patient refused    Attends meetings of clubs or organizations: Patient refused    Relationship status: Married  Other Topics Concern  . Not on file  Social History Narrative   Lives w/ wife   Caffeine use: none   Right handed    He retired 07/2018 because of medical problems     Family History: The patient's family history includes CVA in his mother; Dementia in his mother and sister; Heart attack in his father; Heart disease in his father; Hepatitis C in his sister; Hypertension in his father and mother.  ROS:   Please see the history of present illness.    ROS  All other systems reviewed and negative.   EKGs/Labs/Other Studies Reviewed:    The following studies were reviewed today: none  EKG:  EKG is  ordered today.  The ekg ordered today demonstrates NSR with no ST changes  Recent Labs: 07/27/2018: Magnesium 2.2 01/25/2019: ALT 10; BUN 39; Creat 2.35; Hemoglobin 11.3; Platelets 163; Potassium 4.2; Sodium 142; TSH 2.38   Recent Lipid Panel    Component Value Date/Time   CHOL 137 06/08/2018 1531   CHOL 233 (H) 09/27/2015 0958   TRIG 109 06/08/2018 1531   HDL 62 06/08/2018 1531   HDL 78 09/27/2015 0958   CHOLHDL 2.2 06/08/2018 1531   VLDL 13 03/28/2017 0945   LDLCALC 56 06/08/2018 1531    Physical Exam:    VS:  BP 140/62   Pulse 64   Ht 6\' 2"  (1.88 m)   Wt 179 lb 12.8 oz (81.6 kg)    SpO2 97%   BMI 23.08 kg/m     Wt Readings from Last 3 Encounters:  04/05/19 179 lb 12.8 oz (81.6 kg)  01/19/19 179 lb (81.2 kg)  09/16/18 178 lb 3.2 oz (80.8 kg)     GEN:  Well nourished, well developed in no acute  distress HEENT: Normal NECK: No JVD; No carotid bruits LYMPHATICS: No lymphadenopathy CARDIAC: RRR, no murmurs, rubs, gallops RESPIRATORY:  Clear to auscultation without rales, wheezing or rhonchi  ABDOMEN: Soft, non-tender, non-distended MUSCULOSKELETAL:  No edema; No deformity  SKIN: Warm and dry NEUROLOGIC:  Alert and oriented x 3 PSYCHIATRIC:  Normal affect   ASSESSMENT:    1. Pulmonary hypertension, unspecified ([place])   2. Pulmonary hypertension (HCC)   3. Benign essential HTN   4. CKD (chronic kidney disease) stage 3, GFR 30-59 ml/min (HCC)   5. Pain in both lower extremities    PLAN:    In order of problems listed above:  1.  Pulmonary HTN -moderate pulmonary HTN (PASP 74mmHg on echo 2019 -repeat echo 07/2019  2. Hypertension -BP is controlled on exam today -continue on  Amlodipine 10mg  daily, Hydralazine 25mg  BID  3. CKD stage 3 -followed by nephrology -his last creatinne was 2.35  4.  Pain in LE bilaterally -he has decreased PT and DP pulses -his pain is more in the bottom of his feet with numbness and suspect his may be related to neuropathy instead of PAD -ABIs were normal in 2016 -I will get LE arterial dopplers to assess further  Medication Adjustments/Labs and Tests Ordered: Current medicines are reviewed at length with the patient today.  Concerns regarding medicines are outlined above.  Orders Placed This Encounter  Procedures  . EKG 12-Lead  . ECHOCARDIOGRAM COMPLETE   No orders of the defined types were placed in this encounter.   Signed, Fransico Him, MD  04/05/2019 2:39 PM    [place]

## 2019-04-13 ENCOUNTER — Ambulatory Visit (INDEPENDENT_AMBULATORY_CARE_PROVIDER_SITE_OTHER): Payer: Medicare HMO

## 2019-04-13 VITALS — BP 150/78 | Ht 74.0 in | Wt 178.0 lb

## 2019-04-13 DIAGNOSIS — Z0000 Encounter for general adult medical examination without abnormal findings: Secondary | ICD-10-CM | POA: Diagnosis not present

## 2019-04-13 NOTE — Progress Notes (Addendum)
Subjective:   Patrick [name]. is a 85 y.o. male who presents for Medicare Annual/Subsequent preventive examination.  Virtual Visit via Telephone Note   I connected with Patrick [name]. on 04/13/19 at  2:00 PM EDT by telephone and verified that I am speaking with the correct person using two identifiers.  Medicare Annual Wellness visit completed telephonically due to Covid-19 pandemic.   Location: Patient: home Provider: office   I discussed the limitations, risks, security and privacy concerns of performing an evaluation and management service by telephone and the availability of in person appointments. The patient expressed understanding and agreed to proceed.  Some vital signs may be absent or patient reported.   Clemetine Marker, LPN   Review of Systems:   Cardiac Risk Factors include: advanced age (>30men, >42 women);hypertension;dyslipidemia;male gender     Objective:    Vitals: BP (!) 150/78   Ht 6\' 2"  (1.88 m)   Wt 178 lb (80.7 kg)   BMI 22.85 kg/m   Body mass index is 22.85 kg/m.  Advanced Directives 04/13/2019 07/26/2018 04/09/2018 03/28/2017 01/13/2017 11/26/2016 11/14/2016  Does Patient Have a Medical Advance Directive? Yes No Yes Yes Yes Yes Yes  Type of Paramedic of [place];Living will - [place];Living will [place];Living will [place];Living will Living will Living will  Does patient want to make changes to medical advance directive? No - Patient declined - - - - - No - Patient declined  Copy of [place] in Chart? No - copy requested - No - copy requested - No - copy requested - -  Would patient like information on creating a medical advance directive? - No - Patient declined - - - - -    Tobacco Social History   Tobacco Use  Smoking Status Former Smoker  . Packs/day: 0.30  . Years: 20.00  . Pack years: 6.00  . Types: Cigarettes  . Quit date:  08/26/1978  . Years since quitting: 40.6  Smokeless Tobacco Never Used  Tobacco Comment   smoking cessation materials not required     Counseling given: Not Answered Comment: smoking cessation materials not required   Clinical Intake:  Pre-visit preparation completed: Yes  Pain : No/denies pain     BMI - recorded: 22.85 Nutritional Status: BMI of 19-24  Normal Nutritional Risks: None Diabetes: No  How often do you need to have someone help you when you read instructions, pamphlets, or other written materials from your doctor or pharmacy?: 1 - Never  Interpreter Needed?: No  Information entered by :: Clemetine Marker LPN  Past Medical History:  Diagnosis Date  . Anemia   . Arthritis   . Bradycardia   . Chronic kidney disease   . CKD (chronic kidney disease), stage IV ([place])   . GERD (gastroesophageal reflux disease)   . High cholesterol   . Hypertension   . IBS (irritable bowel syndrome)   . Pneumonia   . Prostate cancer ([place])   . Pulmonary HTN ([place]) 11/05/2015   32mmHg on echo 2017  . PVD (peripheral vascular disease) ([place])   . Syncope 07/2018   Past Surgical History:  Procedure Laterality Date  . BASCILIC VEIN TRANSPOSITION Left 09/05/2016   Procedure: FIRST STAGE BASILIC VEIN TRANSPOSITION left arm;  Surgeon: Serafina Mitchell, MD;  Location: [facility];  Service: Vascular;  Laterality: Left;  . BASCILIC VEIN TRANSPOSITION Left 11/14/2016   Procedure: BASCILIC VEIN TRANSPOSITION-LEFT  ARM 2ND STAGE;  Surgeon: Serafina Mitchell, MD;  Location: [facility];  Service: Vascular;  Laterality: Left;  . COLONOSCOPY    . EYE SURGERY Left 09/11/2017  . PROSTATE SURGERY    . PROSTATECTOMY     Family History  Problem Relation Age of Onset  . CVA Mother   . Hypertension Mother   . Dementia Mother   . Heart attack Father   . Heart disease Father        before age 56  . Hypertension Father   . Hepatitis C Sister   . Dementia Sister    Social History   Socioeconomic History  .  Marital status: Married    Spouse name: Altha Harm  . Number of children: 1  . Years of education: Not on file  . Highest education level: 6th grade  Occupational History  . Occupation: Retired   Scientific laboratory technician  . Financial resource strain: Not hard at all  . Food insecurity    Worry: Never true    Inability: Never true  . Transportation needs    Medical: No    Non-medical: No  Tobacco Use  . Smoking status: Former Smoker    Packs/day: 0.30    Years: 20.00    Pack years: 6.00    Types: Cigarettes    Quit date: 08/26/1978    Years since quitting: 40.6  . Smokeless tobacco: Never Used  . Tobacco comment: smoking cessation materials not required  Substance and Sexual Activity  . Alcohol use: No    Alcohol/week: 0.0 standard drinks  . Drug use: No  . Sexual activity: Not Currently  Lifestyle  . Physical activity    Days per week: 3 days    Minutes per session: 40 min  . Stress: Not at all  Relationships  . Social connections    Talks on phone: More than three times a week    Gets together: Never    Attends religious service: More than 4 times per year    Active member of club or organization: No    Attends meetings of clubs or organizations: Never    Relationship status: Married  Other Topics Concern  . Not on file  Social History Narrative   Lives w/ wife   Caffeine use: none   Right handed    He retired 07/2018 because of medical problems    Outpatient Encounter Medications as of 04/13/2019  Medication Sig  . amLODipine (NORVASC) 10 MG tablet Take 1 tablet (10 mg total) by mouth daily.  [name] Kitchen aspirin 81 MG tablet Take 81 mg by mouth daily.   [name] Kitchen atorvastatin (LIPITOR) 40 MG tablet Take 1 tablet (40 mg total) by mouth every evening.  . brimonidine (ALPHAGAN) 0.2 % ophthalmic solution Place 1 drop into both eyes 2 (two) times daily.  . dorzolamide-timolol (COSOPT) 22.3-6.8 MG/ML ophthalmic solution Place 1 drop into both eyes 2 (two) times daily.   . hydrALAZINE  (APRESOLINE) 25 MG tablet Take 1 tablet (25 mg total) by mouth 2 (two) times daily.  . Latanoprostene Bunod (VYZULTA) 0.024 % SOLN Place 1 drop into the left eye at bedtime.  M[place]in[place] Jewel Dimesylate (RHOPRESSA) 0.02 % SOLN Place 1 drop into the left eye at bedtime.  . vitamin C (ASCORBIC ACID) 500 MG tablet Take 500 mg by mouth daily.  . Vitamin E 400 UNITS CHEW Chew 400 Units by mouth daily.    No facility-administered encounter medications on file as of 04/13/2019.  Activities of Daily Living In your present state of health, do you have any difficulty performing the following activities: 04/13/2019 01/19/2019  Hearing? N N  Comment declines hearing aids -  Vision? Y Y  Comment - -  Difficulty concentrating or making decisions? Tempie Donning  Walking or climbing stairs? N N  Dressing or bathing? N N  Doing errands, shopping? Y Y  Comment no longer drives -  Conservation officer, nature and eating ? N -  Using the Toilet? N -  In the past six months, have you accidently leaked urine? N -  Do you have problems with loss of bowel control? N -  Managing your Medications? N -  Managing your Finances? N -  Housekeeping or managing your Housekeeping? N -  Some recent data might be hidden    Patient Care Team: Steele Sizer, MD as PCP - General (Family Medicine) Edrick Oh, MD as Consulting Physician (Nephrology) Myrlene Broker, MD as Attending Physician (Urology) Sueanne Margarita, MD as Consulting Physician (Cardiology)   Assessment:   This is a routine wellness examination for [facility].  Exercise Activities and Dietary recommendations Current Exercise Habits: Home exercise routine, Type of exercise: Other - see comments(exercise bike), Time (Minutes): 40, Frequency (Times/Week): 3, Weekly Exercise (Minutes/Week): 120, Intensity: Mild, Exercise limited by: Other - see comments(poor vision)  Goals    . DIET - INCREASE WATER INTAKE     Recommend to drink at least 6-8 8oz glasses of water per day.        Fall Risk Fall Risk  04/13/2019 01/19/2019 09/16/2018 08/07/2018 06/08/2018  Falls in the past year? 0 0 0 1 No  Number falls in past yr: 0 0 0 0 -  Injury with Fall? 0 0 0 1 -  Risk for fall due to : Impaired vision - - - -  Risk for fall due to: Comment - - - - -  Follow up Falls prevention discussed - - - -   FALL RISK PREVENTION PERTAINING TO THE HOME:  Any stairs in or around the home? Yes  If so, do they handrails? Yes   Home free of loose throw rugs in walkways, pet beds, electrical cords, etc? Yes  Adequate lighting in your home to reduce risk of falls? Yes   ASSISTIVE DEVICES UTILIZED TO PREVENT FALLS:  Life alert? No  Use of a cane, walker or w/c? No  Grab bars in the bathroom? Yes  Shower chair or bench in shower? Yes  Elevated toilet seat or a handicapped toilet? No   DME ORDERS:  DME order needed?  No   TIMED UP AND GO:  Was the test performed? No . Telephonic visit  Education: Fall risk prevention has been discussed.  Intervention(s) required? No    Depression Screen PHQ 2/9 Scores 04/13/2019 01/19/2019 09/16/2018 08/07/2018  PHQ - 2 Score 0 0 0 5  PHQ- 9 Score - 0 6 14    Cognitive Function MMSE - Mini Mental State Exam 01/01/2018 07/07/2017  Orientation to time 1 0  Orientation to Place 4 5  Registration 3 0  Attention/ Calculation 0 0  Recall 0 0  Language- name 2 objects 2 2  Language- repeat 1 1  Language- follow 3 step command 3 3  Language- read & follow direction 1 1  Write a sentence 1 1  Copy design 1 0  Total score 17 13     6CIT Screen 04/13/2019 04/09/2018  What Year? 0 points 4  points  What month? 0 points 3 points  What time? 0 points 3 points  Count back from 20 0 points 0 points  Months in reverse 4 points 0 points  Repeat phrase 8 points 10 points  Total Score 12 20    Immunization History  Administered Date(s) Administered  . Influenza, High Dose Seasonal PF 06/13/2015, 08/26/2016, 07/07/2017, 06/08/2018  . Pneumococcal  Conjugate-13 03/23/2015  . Pneumococcal Polysaccharide-23 08/26/2016    Qualifies for Shingles Vaccine? Yes  Shingrix series completed per patient. [facility] did not have any record of this.   Tdap: Although this vaccine is not a covered service during a Wellness Exam, does the patient still wish to receive this vaccine today?  No .  Education has been provided regarding the importance of this vaccine. Advised may receive this vaccine at local pharmacy or Health Dept. Aware to provide a copy of the vaccination record if obtained from local pharmacy or Health Dept. Verbalized acceptance and understanding.  Flu Vaccine: Up to date  Pneumococcal Vaccine: Up to date    Screening Tests Health Maintenance  Topic Date Due  . INFLUENZA VACCINE  04/10/2019  . TETANUS/TDAP  07/06/2019 (Originally 10/09/1950)  . PNA vac Low Risk Adult  Completed   Cancer Screenings:  Colorectal Screening: Completed 02/07/10. No longer required.  Lung Cancer Screening: (Low Dose CT Chest recommended if Age 27-80 years, 30 pack-year currently smoking OR have quit w/in 15years.) does not qualify.   Additional Screening:  Hepatitis C Screening: no longer required  Vision Screening: Recommended annual ophthalmology exams for early detection of glaucoma and other disorders of the eye. Is the patient up to date with their annual eye exam?  Yes  Who is the provider or what is the name of the office in which the pt attends annual eye exams? Dr. Alanda Slim  Dental Screening: Recommended annual dental exams for proper oral hygiene  Community Resource Referral:  CRR required this visit?  No       Plan:    I have personally reviewed and addressed the Medicare Annual Wellness questionnaire and have noted the following in the patient's chart:  A. Medical and social history B. Use of alcohol, tobacco or illicit drugs  C. Current medications and supplements D. Functional ability and status E.   Nutritional status F.  Physical activity G. Advance directives H. List of other physicians I.  Hospitalizations, surgeries, and ER visits in previous 12 months J.  [place] such as hearing and vision if needed, cognitive and depression L. Referrals and appointments   In addition, I have reviewed and discussed with patient certain preventive protocols, quality metrics, and best practice recommendations. A written personalized care plan for preventive services as well as general preventive health recommendations were provided to patient.   Signed,  Clemetine Marker, LPN Nurse Health Advisor   Nurse Notes: pt doing well and appreciative of visit today

## 2019-04-13 NOTE — Patient Instructions (Addendum)
[name] , Thank you for taking time to come for your Medicare Wellness Visit. I appreciate your ongoing commitment to your health goals. Please review the following plan we discussed and let me know if I can assist you in the future.   Screening recommendations/referrals: Colonoscopy: done 2011 Recommended yearly ophthalmology/optometry visit for glaucoma screening and checkup Recommended yearly dental visit for hygiene and checkup  Vaccinations: Influenza vaccine: done 06/08/18 Pneumococcal vaccine: done 08/26/16 Tdap vaccine: due - please contact us if you get a cut or scrape Shingles vaccine: Please provide record of shingrix vaccine, [facility] did not have a record that this was given.   Advanced directives: Please bring a copy of your health care power of attorney and living will to the office at your convenience.  Conditions/risks identified: Keep up the great work!  Next appointment: Please follow up in one year for your Medicare Annual Wellness visit.    Preventive Care 11 Years and Older, Male Preventive care refers to lifestyle choices and visits with your health care provider that can promote health and wellness. What does preventive care include?  A yearly physical exam. This is also called an annual well check.  Dental exams once or twice a year.  Routine eye exams. Ask your health care provider how often you should have your eyes checked.  Personal lifestyle choices, including:  Daily care of your teeth and gums.  Regular physical activity.  Eating a healthy diet.  Avoiding tobacco and drug use.  Limiting alcohol use.  Practicing safe sex.  Taking low doses of aspirin every day.  Taking vitamin and mineral supplements as recommended by your health care provider. What happens during an annual well check? The services and screenings done by your health care provider during your annual well check will depend on your age, overall health, lifestyle risk  factors, and family history of disease. Counseling  Your health care provider may ask you questions about your:  Alcohol use.  Tobacco use.  Drug use.  Emotional well-being.  Home and relationship well-being.  Sexual activity.  Eating habits.  History of falls.  Memory and ability to understand (cognition).  Work and work Statistician. Screening  You may have the following tests or measurements:  Height, weight, and BMI.  Blood pressure.  Lipid and cholesterol levels. These may be checked every 5 years, or more frequently if you are over 24 years old.  Skin check.  Lung cancer screening. You may have this screening every year starting at age 68 if you have a 30-pack-year history of smoking and currently smoke or have quit within the past 15 years.  Fecal occult blood test (FOBT) of the stool. You may have this test every year starting at age 63.  Flexible sigmoidoscopy or colonoscopy. You may have a sigmoidoscopy every 5 years or a colonoscopy every 10 years starting at age 59.  Prostate cancer screening. Recommendations will vary depending on your family history and other risks.  Hepatitis C blood test.  Hepatitis B blood test.  Sexually transmitted disease (STD) testing.  Diabetes screening. This is done by checking your blood sugar (glucose) after you have not eaten for a while (fasting). You may have this done every 1-3 years.  Abdominal aortic aneurysm (AAA) screening. You may need this if you are a current or former smoker.  Osteoporosis. You may be screened starting at age 60 if you are at high risk. Talk with your health care provider about your test results, treatment options,  and if necessary, the need for more tests. Vaccines  Your health care provider may recommend certain vaccines, such as:  Influenza vaccine. This is recommended every year.  Tetanus, diphtheria, and acellular pertussis (Tdap, Td) vaccine. You may need a Td booster every 10  years.  Zoster vaccine. You may need this after age 14.  Pneumococcal 13-valent conjugate (PCV13) vaccine. One dose is recommended after age 1.  Pneumococcal polysaccharide (PPSV23) vaccine. One dose is recommended after age 46. Talk to your health care provider about which screenings and vaccines you need and how often you need them. This information is not intended to replace advice given to you by your health care provider. Make sure you discuss any questions you have with your health care provider. Document Released: 09/22/2015 Document Revised: 05/15/2016 Document Reviewed: 06/27/2015 Elsevier Interactive Patient Education  2017 [place] Prevention in the Home Falls can cause injuries. They can happen to people of all ages. There are many things you can do to make your home safe and to help prevent falls. What can I do on the outside of my home?  Regularly fix the edges of walkways and driveways and fix any cracks.  Remove anything that might make you trip as you walk through a door, such as a raised step or threshold.  Trim any bushes or trees on the path to your home.  Use bright outdoor lighting.  Clear any walking paths of anything that might make someone trip, such as rocks or tools.  Regularly check to see if handrails are loose or broken. Make sure that both sides of any steps have handrails.  Any raised decks and porches should have guardrails on the edges.  Have any leaves, snow, or ice cleared regularly.  Use sand or salt on walking paths during winter.  Clean up any spills in your garage right away. This includes oil or grease spills. What can I do in the bathroom?  Use night lights.  Install grab bars by the toilet and in the tub and shower. Do not use towel bars as grab bars.  Use non-skid mats or decals in the tub or shower.  If you need to sit down in the shower, use a plastic, non-slip stool.  Keep the floor dry. Clean up any water that  spills on the floor as soon as it happens.  Remove soap buildup in the tub or shower regularly.  Attach bath mats securely with double-sided non-slip rug tape.  Do not have throw rugs and other things on the floor that can make you trip. What can I do in the bedroom?  Use night lights.  Make sure that you have a light by your bed that is easy to reach.  Do not use any sheets or blankets that are too big for your bed. They should not hang down onto the floor.  Have a firm chair that has side arms. You can use this for support while you get dressed.  Do not have throw rugs and other things on the floor that can make you trip. What can I do in the kitchen?  Clean up any spills right away.  Avoid walking on wet floors.  Keep items that you use a lot in easy-to-reach places.  If you need to reach something above you, use a strong step stool that has a grab bar.  Keep electrical cords out of the way.  Do not use floor polish or wax that makes floors slippery. If  you must use wax, use non-skid floor wax.  Do not have throw rugs and other things on the floor that can make you trip. What can I do with my stairs?  Do not leave any items on the stairs.  Make sure that there are handrails on both sides of the stairs and use them. Fix handrails that are broken or loose. Make sure that handrails are as long as the stairways.  Check any carpeting to make sure that it is firmly attached to the stairs. Fix any carpet that is loose or worn.  Avoid having throw rugs at the top or bottom of the stairs. If you do have throw rugs, attach them to the floor with carpet tape.  Make sure that you have a light switch at the top of the stairs and the bottom of the stairs. If you do not have them, ask someone to add them for you. What else can I do to help prevent falls?  Wear shoes that:  Do not have high heels.  Have rubber bottoms.  Are comfortable and fit you well.  Are closed at the  toe. Do not wear sandals.  If you use a stepladder:  Make sure that it is fully opened. Do not climb a closed stepladder.  Make sure that both sides of the stepladder are locked into place.  Ask someone to hold it for you, if possible.  Clearly mark and make sure that you can see:  Any grab bars or handrails.  First and last steps.  Where the edge of each step is.  Use tools that help you move around (mobility aids) if they are needed. These include:  Canes.  Walkers.  Scooters.  Crutches.  Turn on the lights when you go into a dark area. Replace any light bulbs as soon as they burn out.  Set up your furniture so you have a clear path. Avoid moving your furniture around.  If any of your floors are uneven, fix them.  If there are any pets around you, be aware of where they are.  Review your medicines with your doctor. Some medicines can make you feel dizzy. This can increase your chance of falling. Ask your doctor what other things that you can do to help prevent falls. This information is not intended to replace advice given to you by your health care provider. Make sure you discuss any questions you have with your health care provider. Document Released: 06/22/2009 Document Revised: 02/01/2016 Document Reviewed: 09/30/2014 Elsevier Interactive Patient Education  2017 [employer].

## 2019-04-15 ENCOUNTER — Other Ambulatory Visit: Payer: Self-pay | Admitting: Cardiology

## 2019-04-15 DIAGNOSIS — M79604 Pain in right leg: Secondary | ICD-10-CM

## 2019-04-15 DIAGNOSIS — M79605 Pain in left leg: Secondary | ICD-10-CM

## 2019-04-19 ENCOUNTER — Other Ambulatory Visit: Payer: Self-pay | Admitting: Cardiology

## 2019-04-19 DIAGNOSIS — M79605 Pain in left leg: Secondary | ICD-10-CM

## 2019-04-19 DIAGNOSIS — I739 Peripheral vascular disease, unspecified: Secondary | ICD-10-CM

## 2019-04-19 DIAGNOSIS — M79604 Pain in right leg: Secondary | ICD-10-CM

## 2019-04-27 ENCOUNTER — Other Ambulatory Visit: Payer: Self-pay

## 2019-04-27 ENCOUNTER — Ambulatory Visit (HOSPITAL_COMMUNITY)
Admission: RE | Admit: 2019-04-27 | Discharge: 2019-04-27 | Disposition: A | Discharge status: Home / Self Care | Payer: Medicare HMO | Source: Ambulatory Visit | Attending: Cardiovascular Disease | Admitting: Cardiovascular Disease

## 2019-04-27 DIAGNOSIS — M79604 Pain in right leg: Secondary | ICD-10-CM

## 2019-04-27 DIAGNOSIS — M79605 Pain in left leg: Secondary | ICD-10-CM | POA: Diagnosis not present

## 2019-04-27 DIAGNOSIS — I739 Peripheral vascular disease, unspecified: Secondary | ICD-10-CM | POA: Diagnosis not present

## 2019-05-06 ENCOUNTER — Telehealth: Payer: Self-pay | Admitting: Family Medicine

## 2019-05-06 DIAGNOSIS — I129 Hypertensive chronic kidney disease with stage 1 through stage 4 chronic kidney disease, or unspecified chronic kidney disease: Secondary | ICD-10-CM

## 2019-05-06 DIAGNOSIS — N183 Chronic kidney disease, stage 3 unspecified: Secondary | ICD-10-CM

## 2019-05-07 NOTE — Telephone Encounter (Signed)
lvm informing pt that prescription has been sent to pharmacy and for pt to schedule appt with Dr Ancil Boozer after Nov 12

## 2019-05-18 DIAGNOSIS — H401123 Primary open-angle glaucoma, left eye, severe stage: Secondary | ICD-10-CM | POA: Diagnosis not present

## 2019-05-18 DIAGNOSIS — H401112 Primary open-angle glaucoma, right eye, moderate stage: Secondary | ICD-10-CM | POA: Diagnosis not present

## 2019-06-09 ENCOUNTER — Ambulatory Visit (INDEPENDENT_AMBULATORY_CARE_PROVIDER_SITE_OTHER): Payer: Medicare HMO

## 2019-06-09 ENCOUNTER — Other Ambulatory Visit: Payer: Self-pay

## 2019-06-09 DIAGNOSIS — Z23 Encounter for immunization: Secondary | ICD-10-CM | POA: Diagnosis not present

## 2019-07-12 ENCOUNTER — Other Ambulatory Visit: Payer: Self-pay

## 2019-07-12 ENCOUNTER — Ambulatory Visit (INDEPENDENT_AMBULATORY_CARE_PROVIDER_SITE_OTHER): Payer: Medicare HMO | Admitting: Family Medicine

## 2019-07-12 ENCOUNTER — Encounter: Payer: Self-pay | Admitting: Family Medicine

## 2019-07-12 VITALS — BP 140/68 | HR 88 | Temp 97.1°F | Resp 16 | Ht 74.0 in | Wt 178.2 lb

## 2019-07-12 DIAGNOSIS — F0390 Unspecified dementia without behavioral disturbance: Secondary | ICD-10-CM

## 2019-07-12 DIAGNOSIS — Z23 Encounter for immunization: Secondary | ICD-10-CM

## 2019-07-12 DIAGNOSIS — N2581 Secondary hyperparathyroidism of renal origin: Secondary | ICD-10-CM | POA: Diagnosis not present

## 2019-07-12 DIAGNOSIS — E7800 Pure hypercholesterolemia, unspecified: Secondary | ICD-10-CM | POA: Diagnosis not present

## 2019-07-12 DIAGNOSIS — Z79899 Other long term (current) drug therapy: Secondary | ICD-10-CM | POA: Diagnosis not present

## 2019-07-12 DIAGNOSIS — D638 Anemia in other chronic diseases classified elsewhere: Secondary | ICD-10-CM

## 2019-07-12 DIAGNOSIS — I200 Unstable angina: Secondary | ICD-10-CM

## 2019-07-12 DIAGNOSIS — I10 Essential (primary) hypertension: Secondary | ICD-10-CM | POA: Diagnosis not present

## 2019-07-12 DIAGNOSIS — C61 Malignant neoplasm of prostate: Secondary | ICD-10-CM | POA: Diagnosis not present

## 2019-07-12 DIAGNOSIS — I272 Pulmonary hypertension, unspecified: Secondary | ICD-10-CM

## 2019-07-12 DIAGNOSIS — N184 Chronic kidney disease, stage 4 (severe): Secondary | ICD-10-CM

## 2019-07-12 NOTE — Progress Notes (Signed)
Name: Patrick [name].   MRN: [id number]    DOB: 06/23/1932   Date:07/12/2019       Progress Note  Subjective  Chief Complaint  Chief Complaint  Patient presents with  . Hypertension  . Chronic Kidney Disease  . Dementia    HPI  HTN: Heis on Norvasc and Hydralazine BID, he sees Dr. Justin Mend - nephrologist , bp was high when he arrived but improved before he left   Cold Intolerance: wife is concerned because for about months he is always cold, needs to keep house at 77 degrees to keep him comfortable.Explained likely secondary to weight loss, reassurance given   Angina:no recent episodes of chest pain, no SOB, orthopnea or decrease in exercise tolerance   Prostate cancer : he has multiple bone scans and negative for metastasis No longer has bone pain.Symptoms were severe 2017, since he has been off Lupron. No longer has bone pain, his last PSA was 11.41, and they will discuss a new regiment in Dec   Pulmonary hypertension: discussed Revatio therapy, but wife and patient are worried about possible side effects. Echo was ordered in July but not done yet   CKI stageIVcontinue follow up with nephrologist. - [facility] , fistula placed on left upper arm back in 11/2016 and is doing well, has not started HD yet, getting bp managed by nephrologist > he also has secondary hyperparathyroidism   Dyslipidemia: still taking Atorvastatin ,compliant with medication, denies side effects. Last LDL was 56. No side effects We will recheck labs today   Dementia: wife states symptoms are stable, slightly forgetful, wife is now dispensing his medications. Still able to drive to work and back daily.. At work he is also getting a little forgetful . MMS 19 first time, refuses to recheckHe was on Aricept 10 mg, it was started on the  Fall 2018, seen by Neurologist Wife stopped giving him Aricept because it was not working, he is not longer working - had a syncopal episode Fall 2019. He is  stable  Glaucoma: he is having blurred vision left eye, had surgery and is using eye drops, he has not been driving. Unchanged   PVD: swelling resolved, has some cramping, but not associated with activity, wearing compression stocking hoses and is doing well . Recent ABI done was normal  Malnutrition: his weight has gone down from 220 lbs average to 182 lbs today,last visit it was 178 lbs but has been stable lately She has temporal waisting. His wife is giving him protein shakes intermittently, discussed keeping it cold .  Major Depression in remission: doing better, phq 9 is normal today. Wife stopped giving him zoloft    Patient Active Problem List   Diagnosis Date Noted  . CKD (chronic kidney disease) stage 3, GFR 30-59 ml/min 04/05/2019  . Metabolic acidosis, normal anion gap (NAG) 07/27/2018  . Dementia without behavioral disturbance ([place]) 07/27/2018  . Glaucoma 07/27/2018  . Syncope 07/27/2018  . Secondary hyperparathyroidism ([place]) 11/26/2016  . Mild cognitive impairment 01/03/2016  . Elevated prostate specific antigen (PSA) 09/08/2015  . Bone pain 06/13/2015  . Spinal stenosis 06/13/2015  . OP (osteoporosis) 03/20/2015  . Allergic rhinitis 03/20/2015  . Lumbar canal stenosis 03/20/2015  . Vitamin D deficiency 03/20/2015  . Restless legs syndrome 03/20/2015  . Drug-induced gynecomastia 03/20/2015  . Diaphragmatic hernia 03/20/2015  . Acquired cyst of kidney 03/20/2015  . Left ventricular hypertrophy by electrocardiogram 03/20/2015  . Leg pain 03/20/2015  . History of pneumonia 03/20/2015  .  Non-rheumatic tricuspid valve insufficiency 03/20/2015  . Dysmetabolic syndrome 93/79/0240  . Pulmonary hypertension ([place]) 10/31/2014  . Benign essential HTN 07/29/2014  . Dyslipidemia 07/29/2014  . PVD (peripheral vascular disease) ([place])   . Anemia of chronic disease   . IBS (irritable bowel syndrome)   . Bradycardia   . ED (erectile dysfunction) of organic origin 06/29/2012   . Genuine stress incontinence, male 06/29/2012  . Malignant neoplasm of prostate ([place]) 06/29/2012    Past Surgical History:  Procedure Laterality Date  . BASCILIC VEIN TRANSPOSITION Left 09/05/2016   Procedure: FIRST STAGE BASILIC VEIN TRANSPOSITION left arm;  Surgeon: Serafina Mitchell, MD;  Location: [facility];  Service: Vascular;  Laterality: Left;  . BASCILIC VEIN TRANSPOSITION Left 11/14/2016   Procedure: BASCILIC VEIN TRANSPOSITION-LEFT ARM 2ND STAGE;  Surgeon: Serafina Mitchell, MD;  Location: [facility];  Service: Vascular;  Laterality: Left;  . COLONOSCOPY    . EYE SURGERY Left 09/11/2017  . PROSTATE SURGERY    . PROSTATECTOMY      Family History  Problem Relation Age of Onset  . CVA Mother   . Hypertension Mother   . Dementia Mother   . Heart attack Father   . Heart disease Father        before age 26  . Hypertension Father   . Hepatitis C Sister   . Dementia Sister     Social History   Socioeconomic History  . Marital status: Married    Spouse name: Altha Harm  . Number of children: 1  . Years of education: Not on file  . Highest education level: 6th grade  Occupational History  . Occupation: Retired   Scientific laboratory technician  . Financial resource strain: Not hard at all  . Food insecurity    Worry: Never true    Inability: Never true  . Transportation needs    Medical: No    Non-medical: No  Tobacco Use  . Smoking status: Former Smoker    Packs/day: 0.30    Years: 20.00    Pack years: 6.00    Types: Cigarettes    Quit date: 08/26/1978    Years since quitting: 40.9  . Smokeless tobacco: Never Used  . Tobacco comment: smoking cessation materials not required  Substance and Sexual Activity  . Alcohol use: No    Alcohol/week: 0.0 standard drinks  . Drug use: No  . Sexual activity: Not Currently  Lifestyle  . Physical activity    Days per week: 3 days    Minutes per session: 40 min  . Stress: Not at all  Relationships  . Social connections    Talks on phone: More  than three times a week    Gets together: Never    Attends religious service: More than 4 times per year    Active member of club or organization: No    Attends meetings of clubs or organizations: Never    Relationship status: Married  . Intimate partner violence    Fear of current or ex partner: No    Emotionally abused: No    Physically abused: No    Forced sexual activity: No  Other Topics Concern  . Not on file  Social History Narrative   Lives w/ wife   Caffeine use: none   Right handed    He retired 07/2018 because of medical problems     Current Outpatient Medications:  .  amLODipine (NORVASC) 10 MG tablet, Take 1 tablet (10 mg total) by mouth daily.,  Disp: 90 tablet, Rfl: 0 .  aspirin 81 MG tablet, Take 81 mg by mouth daily. , Disp: , Rfl:  .  atorvastatin (LIPITOR) 40 MG tablet, Take 1 tablet (40 mg total) by mouth every evening., Disp: 90 tablet, Rfl: 0 .  hydrALAZINE (APRESOLINE) 25 MG tablet, TAKE 1 TABLET TWICE DAILY, Disp: 180 tablet, Rfl: 0 .  Vitamin E 400 UNITS CHEW, Chew 400 Units by mouth daily. , Disp: , Rfl:  .  vitamin C (ASCORBIC ACID) 500 MG tablet, Take 500 mg by mouth daily., Disp: , Rfl:   Allergies  Allergen Reactions  . Ace Inhibitors Cough    cough    I personally reviewed active problem list, medication list, allergies, family history, social history, health maintenance with the patient/caregiver today.   ROS  Constitutional: Negative for fever or weight change.  Respiratory: Negative for cough , wife states only occasionally has  shortness of breath.   Cardiovascular: Negative for chest pain or palpitations.  Gastrointestinal: Negative for abdominal pain, no bowel changes.  Musculoskeletal: Negative for gait problem or joint swelling.  Skin: Negative for rash.  Neurological: Negative for dizziness or headache.  No other specific complaints in a complete review of systems (except as listed in HPI above).   Objective  Vitals:    07/12/19 1331 07/12/19 1340  BP: (!) 170/60 140/68  Pulse: 88   Resp: 16   Temp: (!) 97.1 F (36.2 C)   TempSrc: Temporal   SpO2: 96%   Weight: 178 lb 3.2 oz (80.8 kg)   Height: 6\' 2"  (1.88 m)     Body mass index is 22.88 kg/m.  Physical Exam  Constitutional: Patient appears well-developed and well-nourished.  No distress.  HEENT: head atraumatic, normocephalic, pupils equal and reactive to light Cardiovascular: Normal rate, regular rhythm and normal heart sounds.  No murmur heard. No BLE edema. Pulmonary/Chest: Effort normal and breath sounds normal. No respiratory distress. Abdominal: Soft.  There is no tenderness. Psychiatric: Patient has a normal mood and affect. behavior is normal. Judgment and thought content normal.  PHQ2/9: Depression screen [facility] 2/9 07/12/2019 04/13/2019 01/19/2019 09/16/2018 08/07/2018  Decreased Interest 0 0 0 0 3  Down, Depressed, Hopeless 0 0 0 0 2  PHQ - 2 Score 0 0 0 0 5  Altered sleeping 0 - 0 0 3  Tired, decreased energy 0 - 0 0 3  Change in appetite 0 - 0 3 3  Feeling bad or failure about yourself  0 - 0 3 0  Trouble concentrating 0 - 0 0 0  Moving slowly or fidgety/restless 0 - 0 0 0  Suicidal thoughts 0 - 0 0 0  PHQ-9 Score 0 - 0 6 14  Difficult doing work/chores - - - Somewhat difficult Very difficult    phq 9 is negative   Fall Risk: Fall Risk  07/12/2019 04/13/2019 01/19/2019 09/16/2018 08/07/2018  Falls in the past year? 0 0 0 0 1  Number falls in past yr: 0 0 0 0 0  Injury with Fall? 0 0 0 0 1  Risk for fall due to : - Impaired vision - - -  Risk for fall due to: Comment - - - - -  Follow up - Falls prevention discussed - - -    Functional Status Survey: Is the patient deaf or have difficulty hearing?: No Does the patient have difficulty seeing, even when wearing glasses/contacts?: Yes Does the patient have difficulty concentrating, remembering, or making decisions?: Yes Does  the patient have difficulty walking or climbing stairs?:  No Does the patient have difficulty dressing or bathing?: No Does the patient have difficulty doing errands alone such as visiting a doctor's office or shopping?: No    Assessment & Plan  1. Pulmonary hypertension [facility])  Sees cardiologist   2. Prostate cancer [facility])  He will follow up with urologist   3. Benign essential HTN  At goal today   4. Stage 4 chronic kidney disease ([place])   5. Secondary hyperparathyroidism of renal origin [facility])  Reviewed labs done by Dr. Justin Mend  6. Anemia of chronic disease  Monitored by nephrologist   7. Dementia without behavioral disturbance, unspecified dementia type ([place])  stable  8. Unstable angina pectoris ([place])  Doing well at this time  9. Pure hypercholesterolemia  - Lipid panel - Hepatic function panel  10. Long-term use of high-risk medication  - Hepatic function panel

## 2019-07-13 ENCOUNTER — Other Ambulatory Visit: Payer: Self-pay | Admitting: Family Medicine

## 2019-07-13 DIAGNOSIS — N183 Chronic kidney disease, stage 3 unspecified: Secondary | ICD-10-CM

## 2019-07-13 DIAGNOSIS — E785 Hyperlipidemia, unspecified: Secondary | ICD-10-CM

## 2019-07-13 DIAGNOSIS — I129 Hypertensive chronic kidney disease with stage 1 through stage 4 chronic kidney disease, or unspecified chronic kidney disease: Secondary | ICD-10-CM

## 2019-07-13 LAB — LIPID PANEL
Cholesterol: 135 mg/dL
HDL: 64 mg/dL
LDL Cholesterol (Calc): 56 mg/dL
Non-HDL Cholesterol (Calc): 71 mg/dL
Total CHOL/HDL Ratio: 2.1 (calc)
Triglycerides: 69 mg/dL

## 2019-07-13 LAB — HEPATIC FUNCTION PANEL
AG Ratio: 1.2 (calc) (ref 1.0–2.5)
ALT: 13 U/L (ref 9–46)
AST: 12 U/L (ref 10–35)
Albumin: 4.1 g/dL (ref 3.6–5.1)
Alkaline phosphatase (APISO): 70 U/L (ref 35–144)
Bilirubin, Direct: 0.1 mg/dL (ref 0.0–0.2)
Globulin: 3.4 g/dL (ref 1.9–3.7)
Indirect Bilirubin: 0.4 mg/dL (ref 0.2–1.2)
Total Bilirubin: 0.5 mg/dL (ref 0.2–1.2)
Total Protein: 7.5 g/dL (ref 6.1–8.1)

## 2019-07-13 MED ORDER — HYDRALAZINE HCL 25 MG PO TABS: 25.0000 mg | ORAL_TABLET | Freq: Two times a day (BID) | ORAL | 0 refills | Status: DC

## 2019-07-13 MED ORDER — ATORVASTATIN CALCIUM 40 MG PO TABS: 40.0000 mg | ORAL_TABLET | Freq: Every evening | ORAL | 0 refills | Status: DC

## 2019-07-13 MED ORDER — AMLODIPINE BESYLATE 10 MG PO TABS: 10.0000 mg | ORAL_TABLET | Freq: Every day | ORAL | 0 refills | Status: DC

## 2019-07-13 NOTE — Telephone Encounter (Signed)
Medication Refill - Medication: atorvastatin (LIPITOR) 40 MG tablet  amLODipine (NORVASC) 10 MG tablet hydrALAZINE (APRESOLINE) 25 MG tablet    Has the patient contacted their pharmacy? Yes.   (Agent: If no, request that the patient contact the pharmacy for the refill.) (Agent: If yes, when and what did the pharmacy advise?)  Preferred Pharmacy (with phone number or street name):  [address]  [place] [address]  Phone: [phone] Fax: [phone]     Agent: Please be advised that RX refills may take up to 3 business days. We ask that you follow-up with your pharmacy.

## 2019-07-20 DIAGNOSIS — D631 Anemia in chronic kidney disease: Secondary | ICD-10-CM | POA: Diagnosis not present

## 2019-07-20 DIAGNOSIS — I129 Hypertensive chronic kidney disease with stage 1 through stage 4 chronic kidney disease, or unspecified chronic kidney disease: Secondary | ICD-10-CM | POA: Diagnosis not present

## 2019-07-20 DIAGNOSIS — I739 Peripheral vascular disease, unspecified: Secondary | ICD-10-CM | POA: Diagnosis not present

## 2019-07-20 DIAGNOSIS — E785 Hyperlipidemia, unspecified: Secondary | ICD-10-CM | POA: Diagnosis not present

## 2019-07-20 DIAGNOSIS — C61 Malignant neoplasm of prostate: Secondary | ICD-10-CM | POA: Diagnosis not present

## 2019-07-20 DIAGNOSIS — I5030 Unspecified diastolic (congestive) heart failure: Secondary | ICD-10-CM | POA: Diagnosis not present

## 2019-07-20 DIAGNOSIS — N2581 Secondary hyperparathyroidism of renal origin: Secondary | ICD-10-CM | POA: Diagnosis not present

## 2019-07-20 DIAGNOSIS — N184 Chronic kidney disease, stage 4 (severe): Secondary | ICD-10-CM | POA: Diagnosis not present

## 2019-07-20 DIAGNOSIS — N189 Chronic kidney disease, unspecified: Secondary | ICD-10-CM | POA: Diagnosis not present

## 2019-07-20 DIAGNOSIS — K589 Irritable bowel syndrome without diarrhea: Secondary | ICD-10-CM | POA: Diagnosis not present

## 2019-07-27 ENCOUNTER — Other Ambulatory Visit: Payer: Self-pay

## 2019-07-27 ENCOUNTER — Ambulatory Visit (HOSPITAL_COMMUNITY): Payer: Medicare HMO | Attending: Cardiology

## 2019-07-27 DIAGNOSIS — I272 Pulmonary hypertension, unspecified: Secondary | ICD-10-CM | POA: Diagnosis not present

## 2019-08-02 ENCOUNTER — Telehealth: Payer: Self-pay | Admitting: *Deleted

## 2019-08-02 DIAGNOSIS — I272 Pulmonary hypertension, unspecified: Secondary | ICD-10-CM

## 2019-08-02 NOTE — Telephone Encounter (Signed)
-----   Message from Sueanne Margarita, MD sent at 07/28/2019  6:56 PM EST ----- Echo showed normal LVF with increased thickness and stiffness of heart muscle.  The RV and RA are mildly dilated. There is moderately leakiness of the TV , mildly thickened AV and moderate pulmonary HTN.  Please get a VQ scan to rule out chronic PEs and PFTs to assess for COPD.  Please also order a home sleep study to rule out OSA.  If these studies are normal then will refer to Dr. Aundra Dubin to consider [place]

## 2019-08-02 NOTE — Telephone Encounter (Signed)
I spoke with pt's wife and reviewed results with her.  I told her they would be contacted with date and time of appointments.  I have spoken with radiology scheduling and PFT department at [facility]  PFT's can be done on 12/8.  I spoke with pt's wife who confirms pt can do COVID screening on 12/4 and tests on 12/8.  I spoke with radiology scheduling and scheduled VQ scan for 12/8.  Pt should arrive at 9:30.  I placed call to PFT department at [facility] to schedule PFT's. I left message on voicemail to call office. --Need to also determine who makes appointment for COVID screening.

## 2019-08-03 ENCOUNTER — Telehealth: Payer: Self-pay | Admitting: *Deleted

## 2019-08-03 NOTE — Telephone Encounter (Signed)
Wife aware arrival time at hospital is 9:30 for VQ Scan.  This will be followed by PFT at 11:30

## 2019-08-03 NOTE — Telephone Encounter (Signed)
Spoke with PFT department and test scheduled for 11:30 on 12/8.  No smoking, no inhalers and no caffeine for 4 hours prior to procedure.  I spoke with pt's wife and gave her this information. COVID screening arranged for 12/4 at 12:10.  Wife aware pt will need to quarantine after testing until time of tests on 12/8.

## 2019-08-03 NOTE — Telephone Encounter (Signed)
Staff message sent to Patrick [name] does not require PA for HST ok to schedule.

## 2019-08-11 ENCOUNTER — Telehealth: Payer: Self-pay | Admitting: *Deleted

## 2019-08-11 DIAGNOSIS — I272 Pulmonary hypertension, unspecified: Secondary | ICD-10-CM

## 2019-08-11 DIAGNOSIS — G2581 Restless legs syndrome: Secondary | ICD-10-CM

## 2019-08-11 NOTE — Telephone Encounter (Signed)
-----   Message from Lauralee Evener, Tobias sent at 08/03/2019 12:44 PM EST ----- Regarding: RE: home sleep study [employer] does not require PA for HST. Ok to schedule. ----- Message ----- From: Thompson Grayer, RN Sent: 08/02/2019   4:31 PM EST To: Cv Div Sleep Studies Subject: home sleep study                               Dr Radford Pax has ordered a home sleep study to rule out OSA for this patient. Thanks, Fraser Din

## 2019-08-12 NOTE — Telephone Encounter (Signed)
Patient/Wife is aware and agreeable to Home Sleep Study through [facility]. Patient/Wife is scheduled for 10/07/19 at 11 AM to pick up home sleep kit and meet with Respiratory therapist at [facility]. Patient/Wife is aware that if this appointment date and time does not work for them they should contact [facility] directly at [phone]. Patient/Wife is aware that a sleep packet will be sent from [facility] in week. Patient/ wife is agreeable to treatment and thankful for call.

## 2019-08-13 ENCOUNTER — Other Ambulatory Visit (HOSPITAL_COMMUNITY)
Admission: RE | Admit: 2019-08-13 | Discharge: 2019-08-13 | Disposition: A | Discharge status: Home / Self Care | Payer: Medicare HMO | Source: Ambulatory Visit | Attending: Cardiology | Admitting: Cardiology

## 2019-08-13 DIAGNOSIS — Z20828 Contact with and (suspected) exposure to other viral communicable diseases: Secondary | ICD-10-CM | POA: Insufficient documentation

## 2019-08-13 DIAGNOSIS — Z01812 Encounter for preprocedural laboratory examination: Secondary | ICD-10-CM | POA: Diagnosis not present

## 2019-08-14 LAB — NOVEL CORONAVIRUS, NAA (HOSP ORDER, SEND-OUT TO REF LAB; TAT 18-24 HRS): SARS-CoV-2, NAA: NOT DETECTED

## 2019-08-17 ENCOUNTER — Other Ambulatory Visit: Payer: Self-pay

## 2019-08-17 ENCOUNTER — Other Ambulatory Visit: Payer: Self-pay | Admitting: Cardiology

## 2019-08-17 ENCOUNTER — Ambulatory Visit (HOSPITAL_COMMUNITY)
Admission: RE | Admit: 2019-08-17 | Discharge: 2019-08-17 | Disposition: A | Discharge status: Home / Self Care | Payer: Medicare HMO | Source: Ambulatory Visit | Attending: Cardiology | Admitting: Cardiology

## 2019-08-17 ENCOUNTER — Encounter (HOSPITAL_COMMUNITY): Payer: Medicare HMO

## 2019-08-17 DIAGNOSIS — Z0389 Encounter for observation for other suspected diseases and conditions ruled out: Secondary | ICD-10-CM | POA: Diagnosis not present

## 2019-08-17 DIAGNOSIS — I272 Pulmonary hypertension, unspecified: Secondary | ICD-10-CM

## 2019-08-17 DIAGNOSIS — I270 Primary pulmonary hypertension: Secondary | ICD-10-CM | POA: Diagnosis not present

## 2019-08-17 MED ORDER — TECHNETIUM TO 99M ALBUMIN AGGREGATED
1.6000 | Freq: Once | INTRAVENOUS | Status: AC | PRN
  Administered 2019-08-17: 1.6 via INTRAVENOUS

## 2019-08-23 DIAGNOSIS — C61 Malignant neoplasm of prostate: Secondary | ICD-10-CM | POA: Diagnosis not present

## 2019-08-23 DIAGNOSIS — R9721 Rising PSA following treatment for malignant neoplasm of prostate: Secondary | ICD-10-CM | POA: Diagnosis not present

## 2019-08-23 DIAGNOSIS — R972 Elevated prostate specific antigen [PSA]: Secondary | ICD-10-CM | POA: Diagnosis not present

## 2019-08-30 DIAGNOSIS — N2581 Secondary hyperparathyroidism of renal origin: Secondary | ICD-10-CM | POA: Diagnosis not present

## 2019-08-30 DIAGNOSIS — N184 Chronic kidney disease, stage 4 (severe): Secondary | ICD-10-CM | POA: Diagnosis not present

## 2019-09-14 DIAGNOSIS — H401123 Primary open-angle glaucoma, left eye, severe stage: Secondary | ICD-10-CM | POA: Diagnosis not present

## 2019-09-14 DIAGNOSIS — H401112 Primary open-angle glaucoma, right eye, moderate stage: Secondary | ICD-10-CM | POA: Diagnosis not present

## 2019-10-07 ENCOUNTER — Encounter (HOSPITAL_BASED_OUTPATIENT_CLINIC_OR_DEPARTMENT_OTHER): Payer: Medicare HMO | Admitting: Cardiology

## 2019-10-11 ENCOUNTER — Other Ambulatory Visit: Payer: Self-pay | Admitting: Family Medicine

## 2019-10-11 DIAGNOSIS — E785 Hyperlipidemia, unspecified: Secondary | ICD-10-CM

## 2019-10-11 DIAGNOSIS — I129 Hypertensive chronic kidney disease with stage 1 through stage 4 chronic kidney disease, or unspecified chronic kidney disease: Secondary | ICD-10-CM

## 2019-10-11 NOTE — Telephone Encounter (Signed)
Requested Prescriptions  Pending Prescriptions Disp Refills  . atorvastatin (LIPITOR) 40 MG tablet [Pharmacy Med Name: ATORVASTATIN CALCIUM 40 MG Tablet] 90 tablet 0    Sig: TAKE 1 TABLET EVERY EVENING     Cardiovascular:  Antilipid - Statins Passed - 10/11/2019 11:48 AM      Passed - Total Cholesterol in normal range and within 360 days    Cholesterol, Total  Date Value Ref Range Status  09/27/2015 233 (H) 100 - 199 mg/dL Final   Cholesterol  Date Value Ref Range Status  07/12/2019 135 <200 mg/dL Final         Passed - LDL in normal range and within 360 days    LDL Cholesterol (Calc)  Date Value Ref Range Status  07/12/2019 56 mg/dL (calc) Final    Comment:    Reference range: <100 . Desirable range <100 mg/dL for primary prevention;   <70 mg/dL for patients with CHD or diabetic patients  with > or = 2 CHD risk factors. [name] Kitchen LDL-C is now calculated using the Martin-Hopkins  calculation, which is a validated novel method providing  better accuracy than the Friedewald equation in the  estimation of LDL-C.  Cresenciano Genre et al. Annamaria Helling. 7893;810(17): 2061-2068  (http://education.[employer].com/faq/FAQ164)          Passed - HDL in normal range and within 360 days    HDL  Date Value Ref Range Status  07/12/2019 64 > OR = 40 mg/dL Final  09/27/2015 78 >39 mg/dL Final         Passed - Triglycerides in normal range and within 360 days    Triglycerides  Date Value Ref Range Status  07/12/2019 69 <150 mg/dL Final         Passed - Patient is not pregnant      Passed - Valid encounter within last 12 months    Recent Outpatient Visits          3 months ago Pulmonary hypertension [facility])   [facility] Steele Sizer, MD   8 months ago Thrombocytopenia [facility])   [facility] Steele Sizer, MD   1 year ago Benign essential HTN   [facility] Steele Sizer, MD   1 year ago Stage 4 chronic kidney disease [facility])   [facility] Steele Sizer, MD   1 year ago Benign essential HTN   [facility] [place], Drue Stager, MD      Future Appointments            In 3 months Sowles, Drue Stager, MD [facility], [place]   In 6 months  [facility]           . amLODipine (NORVASC) 10 MG tablet [Pharmacy Med Name: AMLODIPINE BESYLATE 10 MG Tablet] 90 tablet 0    Sig: TAKE 1 TABLET EVERY DAY     Cardiovascular:  Calcium Channel Blockers Failed - 10/11/2019 11:48 AM      Failed - Last BP in normal range    BP Readings from Last 1 Encounters:  07/12/19 140/68         Passed - Valid encounter within last 6 months    Recent Outpatient Visits          3 months ago Pulmonary hypertension [facility])   [facility] Steele Sizer, MD   8 months ago Thrombocytopenia [facility])   [facility] Steele Sizer, MD   1 year ago  Benign essential HTN   [facility] Steele Sizer, MD   1 year ago Stage 4 chronic kidney disease [facility])   [facility] Steele Sizer, MD   1 year ago Benign essential HTN   [facility] Steele Sizer, MD      Future Appointments            In 3 months Ancil Boozer, Drue Stager, MD [facility], [place]   In 6 months  [place]

## 2019-10-12 DIAGNOSIS — N184 Chronic kidney disease, stage 4 (severe): Secondary | ICD-10-CM | POA: Diagnosis not present

## 2019-11-26 DIAGNOSIS — R972 Elevated prostate specific antigen [PSA]: Secondary | ICD-10-CM | POA: Diagnosis not present

## 2019-11-29 DIAGNOSIS — E785 Hyperlipidemia, unspecified: Secondary | ICD-10-CM | POA: Diagnosis not present

## 2019-11-29 DIAGNOSIS — N189 Chronic kidney disease, unspecified: Secondary | ICD-10-CM | POA: Diagnosis not present

## 2019-11-29 DIAGNOSIS — K589 Irritable bowel syndrome without diarrhea: Secondary | ICD-10-CM | POA: Diagnosis not present

## 2019-11-29 DIAGNOSIS — D631 Anemia in chronic kidney disease: Secondary | ICD-10-CM | POA: Diagnosis not present

## 2019-11-29 DIAGNOSIS — I129 Hypertensive chronic kidney disease with stage 1 through stage 4 chronic kidney disease, or unspecified chronic kidney disease: Secondary | ICD-10-CM | POA: Diagnosis not present

## 2019-11-29 DIAGNOSIS — N184 Chronic kidney disease, stage 4 (severe): Secondary | ICD-10-CM | POA: Diagnosis not present

## 2019-11-29 DIAGNOSIS — C61 Malignant neoplasm of prostate: Secondary | ICD-10-CM | POA: Diagnosis not present

## 2019-11-29 DIAGNOSIS — I5030 Unspecified diastolic (congestive) heart failure: Secondary | ICD-10-CM | POA: Diagnosis not present

## 2019-11-29 DIAGNOSIS — I739 Peripheral vascular disease, unspecified: Secondary | ICD-10-CM | POA: Diagnosis not present

## 2019-11-29 DIAGNOSIS — N2581 Secondary hyperparathyroidism of renal origin: Secondary | ICD-10-CM | POA: Diagnosis not present

## 2019-12-03 ENCOUNTER — Other Ambulatory Visit: Payer: Self-pay | Admitting: Nephrology

## 2019-12-09 ENCOUNTER — Other Ambulatory Visit: Payer: Self-pay | Admitting: Nephrology

## 2019-12-09 DIAGNOSIS — R109 Unspecified abdominal pain: Secondary | ICD-10-CM

## 2019-12-15 ENCOUNTER — Other Ambulatory Visit: Payer: Self-pay | Admitting: Nephrology

## 2019-12-15 ENCOUNTER — Ambulatory Visit
Admission: RE | Admit: 2019-12-15 | Discharge: 2019-12-15 | Disposition: A | Discharge status: Home / Self Care | Payer: Medicare HMO | Source: Ambulatory Visit | Attending: Nephrology | Admitting: Nephrology

## 2019-12-15 DIAGNOSIS — R109 Unspecified abdominal pain: Secondary | ICD-10-CM

## 2019-12-15 DIAGNOSIS — N281 Cyst of kidney, acquired: Secondary | ICD-10-CM | POA: Diagnosis not present

## 2020-01-04 ENCOUNTER — Other Ambulatory Visit: Payer: Self-pay | Admitting: Family Medicine

## 2020-01-04 DIAGNOSIS — E785 Hyperlipidemia, unspecified: Secondary | ICD-10-CM

## 2020-01-04 DIAGNOSIS — I129 Hypertensive chronic kidney disease with stage 1 through stage 4 chronic kidney disease, or unspecified chronic kidney disease: Secondary | ICD-10-CM

## 2020-01-10 ENCOUNTER — Other Ambulatory Visit: Payer: Self-pay

## 2020-01-10 ENCOUNTER — Ambulatory Visit (INDEPENDENT_AMBULATORY_CARE_PROVIDER_SITE_OTHER): Payer: Medicare HMO | Admitting: Family Medicine

## 2020-01-10 ENCOUNTER — Encounter: Payer: Self-pay | Admitting: Family Medicine

## 2020-01-10 VITALS — BP 142/76 | HR 89 | Temp 97.1°F | Resp 16 | Ht 74.0 in | Wt 177.4 lb

## 2020-01-10 DIAGNOSIS — I519 Heart disease, unspecified: Secondary | ICD-10-CM

## 2020-01-10 DIAGNOSIS — D638 Anemia in other chronic diseases classified elsewhere: Secondary | ICD-10-CM | POA: Diagnosis not present

## 2020-01-10 DIAGNOSIS — I5189 Other ill-defined heart diseases: Secondary | ICD-10-CM

## 2020-01-10 DIAGNOSIS — C61 Malignant neoplasm of prostate: Secondary | ICD-10-CM | POA: Diagnosis not present

## 2020-01-10 DIAGNOSIS — F0390 Unspecified dementia without behavioral disturbance: Secondary | ICD-10-CM | POA: Diagnosis not present

## 2020-01-10 DIAGNOSIS — I129 Hypertensive chronic kidney disease with stage 1 through stage 4 chronic kidney disease, or unspecified chronic kidney disease: Secondary | ICD-10-CM

## 2020-01-10 DIAGNOSIS — E785 Hyperlipidemia, unspecified: Secondary | ICD-10-CM | POA: Diagnosis not present

## 2020-01-10 DIAGNOSIS — N184 Chronic kidney disease, stage 4 (severe): Secondary | ICD-10-CM | POA: Diagnosis not present

## 2020-01-10 DIAGNOSIS — E559 Vitamin D deficiency, unspecified: Secondary | ICD-10-CM | POA: Diagnosis not present

## 2020-01-10 DIAGNOSIS — N2581 Secondary hyperparathyroidism of renal origin: Secondary | ICD-10-CM | POA: Diagnosis not present

## 2020-01-10 DIAGNOSIS — I739 Peripheral vascular disease, unspecified: Secondary | ICD-10-CM

## 2020-01-10 DIAGNOSIS — F320 Major depressive disorder, single episode, mild: Secondary | ICD-10-CM | POA: Insufficient documentation

## 2020-01-10 DIAGNOSIS — N183 Chronic kidney disease, stage 3 unspecified: Secondary | ICD-10-CM

## 2020-01-10 DIAGNOSIS — I10 Essential (primary) hypertension: Secondary | ICD-10-CM | POA: Diagnosis not present

## 2020-01-10 MED ORDER — AMLODIPINE BESYLATE 10 MG PO TABS: 10.0000 mg | ORAL_TABLET | Freq: Every day | ORAL | 1 refills | Status: DC

## 2020-01-10 NOTE — Progress Notes (Signed)
Name: Patrick [name].   MRN: [id number]    DOB: February 20, 1932   Date:01/10/2020       Progress Note  Subjective  Chief Complaint  Chief Complaint  Patient presents with  . Medication Refill    6 month F/U  . Hypertension  . Cold Intolerance  . Prostate Cancer  . Dyslipidemia  . Dementia  . PVD  . Depression    HPI  HTN: Heis on Norvasc and Hydralazine BID, he sees Dr. Justin Mend - nephrologist , bp was high when he arrived but improved before he left   Cold Intolerance: wife is concerned because for about months he is always cold, needs to keep house at 77 degrees to keep him comfortable.Explained likely secondary to weight loss, reassurance given   Angina:no recent episodes of chest pain, no SOB, orthopnea or decrease in exercise tolerance   Prostate cancer: he has multiple bone scans and negative for metastasisNo longer has bone pain.Symptoms were severe 2017, since he has been off Lupron. No longer has bone pain, his last PSA was 11.41, and they will discuss a new regiment in Dec   Diastolic dysfunction stage II : may be the cause of SOB with activity, no orthopnea   1. Left ventricular ejection fraction, by visual estimation, is 60 to 65%. The left ventricle has normal function. There is moderately increased left ventricular hypertrophy. 2. Left ventricular diastolic parameters are consistent with Grade II diastolic dysfunction (pseudonormalization). 3. Elevated left atrial pressure. 4. Global right ventricle has normal systolic function.The right ventricular size is mildly enlarged. 5. Small pericardial effusion. Measures up to 0.9cm adjacent to LV lateral wall 6. Left atrial size was mildly dilated. 7. Right atrial size was normal. 8. The mitral valve is normal in structure. No evidence of mitral valve regurgitation. 9. The tricuspid valve is normal in structure. Tricuspid valve regurgitation moderate. 10. The aortic valve is tricuspid. Aortic valve regurgitation  is not visualized. Mild aortic valve sclerosis without stenosis. 11. The pulmonic valve was normal in structure. Pulmonic valve regurgitation is trivial. 12. The inferior vena cava is normal in size with greater than 50% respiratory variability, suggesting right atrial pressure of 3 mmHg. 13. The tricuspid regurgitant velocity is 3.58 m/s, and with an assumed right atrial pressure of 3 mmHg, the estimated right ventricular systolic pressure is moderately elevated at 54.3 mmHg. Left Ventricle:  CKI stageIVcontinue follow up with nephrologist. - [facility] , fistula placed on left upper arm back in 11/2016 and is doing well, has not started HD yet, getting bp managed by nephrologist but I cannot see the labs and we will recheck labs   Dyslipidemia: still taking Atorvastatin ,compliant with medication, denies side effects. Last LDL was 56. No side effectsWe will recheck labs next visit   Dementia: wife states symptoms are stable, slightly forgetful, wife is now dispensing his medications.  MMS 19 first time, refuses to recheckHe was on Aricept 10 mg, it was started on the  Fall 2018, seen by Neurologist. Wife stopped giving him the medication because it was not working. He stopped working in 2019/retired after a fall Fall 2019.   Glaucoma open angle : he is having blurred vision left eye, had surgery and is using eye drops,he has not been driving, he has swelling, diagnosed as severe, still able to see. He is compliant with visits  PVD: swelling resolved, has some cramping, he wears compression stocking hoses , he does not walk far because he gets SOB  Malnutrition: his weight has gone down from 220 lbs average to 182 lbs for a while and past two visit 178 lbs and 177 lbs. He  has temporal waisting. His wife is giving him protein shakes intermittently. Difficulty to increase protein intake because of his kidney dysfunction   Major Depression he was taking zoloft but wife stopped  giving him medication , phq 9 is positive again, but wife states he is fine   Patient Active Problem List   Diagnosis Date Noted  . CKD (chronic kidney disease) stage 3, GFR 30-59 ml/min 04/05/2019  . Metabolic acidosis, normal anion gap (NAG) 07/27/2018  . Dementia without behavioral disturbance ([place]) 07/27/2018  . Glaucoma 07/27/2018  . Syncope 07/27/2018  . Secondary hyperparathyroidism ([place]) 11/26/2016  . Mild cognitive impairment 01/03/2016  . Elevated prostate specific antigen (PSA) 09/08/2015  . Bone pain 06/13/2015  . Spinal stenosis 06/13/2015  . OP (osteoporosis) 03/20/2015  . Allergic rhinitis 03/20/2015  . Lumbar canal stenosis 03/20/2015  . Vitamin D deficiency 03/20/2015  . Restless legs syndrome 03/20/2015  . Drug-induced gynecomastia 03/20/2015  . Diaphragmatic hernia 03/20/2015  . Acquired cyst of kidney 03/20/2015  . Left ventricular hypertrophy by electrocardiogram 03/20/2015  . Leg pain 03/20/2015  . History of pneumonia 03/20/2015  . Non-rheumatic tricuspid valve insufficiency 03/20/2015  . Dysmetabolic syndrome 48/54/6270  . Pulmonary hypertension ([place]) 10/31/2014  . Benign essential HTN 07/29/2014  . Dyslipidemia 07/29/2014  . PVD (peripheral vascular disease) ([place])   . Anemia of chronic disease   . IBS (irritable bowel syndrome)   . Bradycardia   . ED (erectile dysfunction) of organic origin 06/29/2012  . Genuine stress incontinence, male 06/29/2012  . Malignant neoplasm of prostate ([place]) 06/29/2012    Past Surgical History:  Procedure Laterality Date  . BASCILIC VEIN TRANSPOSITION Left 09/05/2016   Procedure: FIRST STAGE BASILIC VEIN TRANSPOSITION left arm;  Surgeon: Serafina Mitchell, MD;  Location: [place];  Service: Vascular;  Laterality: Left;  . BASCILIC VEIN TRANSPOSITION Left 11/14/2016   Procedure: BASCILIC VEIN TRANSPOSITION-LEFT ARM 2ND STAGE;  Surgeon: Serafina Mitchell, MD;  Location: [place];  Service: Vascular;  Laterality: Left;  .  COLONOSCOPY    . EYE SURGERY Left 09/11/2017  . PROSTATE SURGERY    . PROSTATECTOMY      Family History  Problem Relation Age of Onset  . CVA Mother   . Hypertension Mother   . Dementia Mother   . Heart attack Father   . Heart disease Father        before age 35  . Hypertension Father   . Hepatitis C Sister   . Dementia Sister     Social History   Tobacco Use  . Smoking status: Former Smoker    Packs/day: 0.30    Years: 20.00    Pack years: 6.00    Types: Cigarettes    Quit date: 08/26/1978    Years since quitting: 41.4  . Smokeless tobacco: Never Used  . Tobacco comment: smoking cessation materials not required  Substance Use Topics  . Alcohol use: No    Alcohol/week: 0.0 standard drinks     Current Outpatient Medications:  .  amLODipine (NORVASC) 10 MG tablet, TAKE 1 TABLET EVERY DAY, Disp: 90 tablet, Rfl: 0 .  aspirin 81 MG tablet, Take 81 mg by mouth daily. , Disp: , Rfl:  .  atorvastatin (LIPITOR) 40 MG tablet, TAKE 1 TABLET EVERY EVENING, Disp: 90 tablet, Rfl: 1 .  calcitRIOL (ROCALTROL) 0.25  MCG capsule, , Disp: , Rfl:  .  hydrALAZINE (APRESOLINE) 25 MG tablet, TAKE 1 TABLET TWICE DAILY, Disp: 180 tablet, Rfl: 1 .  vitamin C (ASCORBIC ACID) 500 MG tablet, Take 500 mg by mouth daily., Disp: , Rfl:  .  Vitamin E 400 UNITS CHEW, Chew 400 Units by mouth daily. , Disp: , Rfl:   Allergies  Allergen Reactions  . Ace Inhibitors Cough    cough    I personally reviewed active problem list, medication list, allergies, family history, social history, health maintenance with the patient/caregiver today.   ROS  Constitutional: Negative for fever or weight change.  Respiratory: Negative for cough and shortness of breath.   Cardiovascular: Negative for chest pain or palpitations.  Gastrointestinal: Negative for abdominal pain, no bowel changes.  Musculoskeletal: Negative for gait problem or joint swelling.  Skin: Negative for rash.  Neurological: Negative for  dizziness or headache.  No other specific complaints in a complete review of systems (except as listed in HPI above).  Objective  Vitals:   01/10/20 1317  BP: (!) 142/76  Pulse: 89  Resp: 16  Temp: (!) 97.1 F (36.2 C)  TempSrc: Temporal  SpO2: 98%  Weight: 177 lb 6.4 oz (80.5 kg)  Height: 6\' 2"  (1.88 m)    Body mass index is 22.78 kg/m.  Physical Exam  Constitutional: Patient appears well-developed and well-nourished.  No distress.  HEENT: head atraumatic, normocephalic, pupils equal and reactive to light,  neck supple Cardiovascular: Normal rate, regular rhythm and normal heart sounds.  No murmur heard. No BLE edema. Pulmonary/Chest: Effort normal and breath sounds normal. No respiratory distress. Abdominal: Soft.  There is no tenderness. Psychiatric: Patient has a normal mood and affect. behavior is normal. Judgment and thought content normal.  PHQ2/9: Depression screen [facility] 2/9 01/10/2020 07/12/2019 04/13/2019 01/19/2019 09/16/2018  Decreased Interest 2 0 0 0 0  Down, Depressed, Hopeless 0 0 0 0 0  PHQ - 2 Score 2 0 0 0 0  Altered sleeping 0 0 - 0 0  Tired, decreased energy 1 0 - 0 0  Change in appetite 2 0 - 0 3  Feeling bad or failure about yourself  0 0 - 0 3  Trouble concentrating 1 0 - 0 0  Moving slowly or fidgety/restless 0 0 - 0 0  Suicidal thoughts 0 0 - 0 0  PHQ-9 Score 6 0 - 0 6  Difficult doing work/chores Somewhat difficult - - - Somewhat difficult  Some recent data might be hidden    phq 9 is positive    Fall Risk: Fall Risk  01/10/2020 07/12/2019 04/13/2019 01/19/2019 09/16/2018  Falls in the past year? 0 0 0 0 0  Number falls in past yr: 0 0 0 0 0  Injury with Fall? 0 0 0 0 0  Risk for fall due to : - - Impaired vision - -  Risk for fall due to: Comment - - - - -  Follow up - - Falls prevention discussed - -     Functional Status Survey: Is the patient deaf or have difficulty hearing?: No Does the patient have difficulty seeing, even when wearing  glasses/contacts?: No Does the patient have difficulty concentrating, remembering, or making decisions?: Yes(Dementia) Does the patient have difficulty walking or climbing stairs?: No Does the patient have difficulty dressing or bathing?: No Does the patient have difficulty doing errands alone such as visiting a doctor's office or shopping?: No    Assessment & Plan  1. Benign essential HTN  - CBC with Differential/Platelet - COMPLETE METABOLIC PANEL WITH GFR  2. Secondary hyperparathyroidism of renal origin ([place])  - Parathyroid hormone, intact (no Ca)  3. Stage 4 chronic kidney disease ([place])  Recheck labs   4. Diastolic heart failure Grade II    5. Benign hypertension with chronic kidney disease, stage III  - amLODipine (NORVASC) 10 MG tablet; Take 1 tablet (10 mg total) by mouth daily.  Dispense: 90 tablet; Refill: 1  6. Dementia without behavioral disturbance, unspecified dementia type ([place])  stable  7. PVD (peripheral vascular disease) ([place])  Not having symptoms likely because not walking much   8. Prostate cancer [facility])  Keep follow up with Urologist in June, PSA going up   9. Dyslipidemia  Continue medication   10. Anemia of chronic disease  Recheck level   11. Vitamin D deficiency  - VITAMIN D 25 Hydroxy (Vit-D Deficiency, Fractures)

## 2020-01-11 LAB — COMPLETE METABOLIC PANEL WITH GFR
AG Ratio: 1.3 (calc) (ref 1.0–2.5)
ALT: 12 U/L (ref 9–46)
AST: 12 U/L (ref 10–35)
Albumin: 4.2 g/dL (ref 3.6–5.1)
Alkaline phosphatase (APISO): 72 U/L (ref 35–144)
BUN/Creatinine Ratio: 12 (calc) (ref 6–22)
BUN: 43 mg/dL — ABNORMAL HIGH (ref 7–25)
CO2: 25 mmol/L (ref 20–32)
Calcium: 10.2 mg/dL (ref 8.6–10.3)
Chloride: 109 mmol/L (ref 98–110)
Creat: 3.69 mg/dL — ABNORMAL HIGH (ref 0.70–1.11)
GFR, Est African American: 16 mL/min/{1.73_m2} — ABNORMAL LOW (ref >=60)
GFR, Est Non African American: 14 mL/min/{1.73_m2} — ABNORMAL LOW (ref >=60)
Globulin: 3.3 g/dL (calc) (ref 1.9–3.7)
Glucose, Bld: 108 mg/dL — ABNORMAL HIGH (ref 65–99)
Potassium: 5.2 mmol/L (ref 3.5–5.3)
Sodium: 142 mmol/L (ref 135–146)
Total Bilirubin: 0.5 mg/dL (ref 0.2–1.2)
Total Protein: 7.5 g/dL (ref 6.1–8.1)

## 2020-01-11 LAB — CBC WITH DIFFERENTIAL/PLATELET
Absolute Monocytes: 374 cells/uL (ref 200–950)
Basophils Absolute: 30 cells/uL (ref 0–200)
Basophils Relative: 0.8 %
Eosinophils Absolute: 111 cells/uL (ref 15–500)
Eosinophils Relative: 3 %
HCT: 31.7 % — ABNORMAL LOW (ref 38.5–50.0)
Hemoglobin: 10.6 g/dL — ABNORMAL LOW (ref 13.2–17.1)
Lymphs Abs: 1432 cells/uL (ref 850–3900)
MCH: 31.5 pg (ref 27.0–33.0)
MCHC: 33.4 g/dL (ref 32.0–36.0)
MCV: 94.1 fL (ref 80.0–100.0)
MPV: 11.2 fL (ref 7.5–12.5)
Monocytes Relative: 10.1 %
Neutro Abs: 1754 cells/uL (ref 1500–7800)
Neutrophils Relative %: 47.4 %
Platelets: 140 10*3/uL (ref 140–400)
RBC: 3.37 10*6/uL — ABNORMAL LOW (ref 4.20–5.80)
RDW: 12.4 % (ref 11.0–15.0)
Total Lymphocyte: 38.7 %
WBC: 3.7 10*3/uL — ABNORMAL LOW (ref 3.8–10.8)

## 2020-01-11 LAB — PARATHYROID HORMONE, INTACT (NO CA): PTH: 185 pg/mL — ABNORMAL HIGH (ref 14–64)

## 2020-01-11 LAB — VITAMIN D 25 HYDROXY (VIT D DEFICIENCY, FRACTURES): Vit D, 25-Hydroxy: 12 ng/mL — ABNORMAL LOW (ref 30–100)

## 2020-01-28 ENCOUNTER — Encounter: Payer: Medicare HMO | Admitting: Family Medicine

## 2020-01-28 ENCOUNTER — Other Ambulatory Visit: Payer: Self-pay

## 2020-01-30 NOTE — Progress Notes (Signed)
Appointment cancelled, he only needed a bone scan order

## 2020-02-02 ENCOUNTER — Telehealth: Payer: Self-pay | Admitting: Emergency Medicine

## 2020-02-02 NOTE — Telephone Encounter (Signed)
Patient wife called and stated that his urologist Dr.Ronald Rosana Hoes at [facility] want him to have a bone density due to PSA being 21.0. They would like his PCP to order the test

## 2020-02-02 NOTE — Telephone Encounter (Signed)
Spoke with patient's wife and informed her that Dr. Rosana Hoes has already placed an order for [name] to have a whole body scan and they can the office of Dr. Rosana Hoes to let them know where they would like to have the scan done.  Patient's wife verbalized understanding.

## 2020-02-15 ENCOUNTER — Other Ambulatory Visit: Payer: Self-pay | Admitting: Urology

## 2020-02-16 ENCOUNTER — Telehealth: Payer: Self-pay

## 2020-02-16 ENCOUNTER — Other Ambulatory Visit: Payer: Self-pay | Admitting: Urology

## 2020-02-16 ENCOUNTER — Other Ambulatory Visit (HOSPITAL_COMMUNITY): Payer: Self-pay | Admitting: Urology

## 2020-02-16 DIAGNOSIS — C61 Malignant neoplasm of prostate: Secondary | ICD-10-CM

## 2020-02-16 NOTE — Telephone Encounter (Signed)
Copied from [place] [id number]. Topic: General - Other >> Feb 16, 2020  1:37 PM Antonieta Iba C wrote: Reason for CRM:  pt's spouse called in for assistance. She says that pt is anemic. Spouse would like to know if there is a medication that can be prescribed to help pt? Spouse says that pt is cold all of the time.   Pharmacy: [facility] [place], [place] [address]  Phone:  [phone] Fax:  [phone]

## 2020-02-22 ENCOUNTER — Encounter (HOSPITAL_COMMUNITY)
Admission: RE | Admit: 2020-02-22 | Discharge: 2020-02-22 | Disposition: A | Discharge status: Home / Self Care | Payer: Medicare HMO | Source: Ambulatory Visit | Attending: Urology | Admitting: Urology

## 2020-02-22 ENCOUNTER — Other Ambulatory Visit: Payer: Self-pay

## 2020-02-22 DIAGNOSIS — C61 Malignant neoplasm of prostate: Secondary | ICD-10-CM | POA: Insufficient documentation

## 2020-02-22 MED ORDER — TECHNETIUM TC 99M MEDRONATE IV KIT
20.0000 | PACK | Freq: Once | INTRAVENOUS | Status: AC | PRN
  Administered 2020-02-22: 20 via INTRAVENOUS

## 2020-02-28 DIAGNOSIS — R972 Elevated prostate specific antigen [PSA]: Secondary | ICD-10-CM | POA: Diagnosis not present

## 2020-02-28 DIAGNOSIS — C61 Malignant neoplasm of prostate: Secondary | ICD-10-CM | POA: Diagnosis not present

## 2020-03-12 IMAGING — CT CT HEAD W/O CM
4 series · 15 of 47 positions shown, 17 images · non-contrast
Comparison: January 16, 2017

CLINICAL DATA: Altered consciousness.

EXAM:
CT HEAD WITHOUT CONTRAST
TECHNIQUE: Contiguous axial images were obtained from the base of the skull
through the vertex without intravenous contrast.

[Series 3: head without · axial · non-contrast · 0.45mm/px · z∈[-28,+87]mm · 7 of 31 slices shown, 9 images]
[im 4/31  brain]
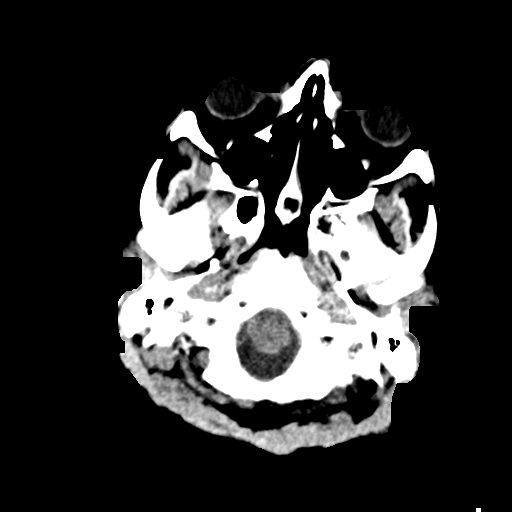
[im 4/31  bone]
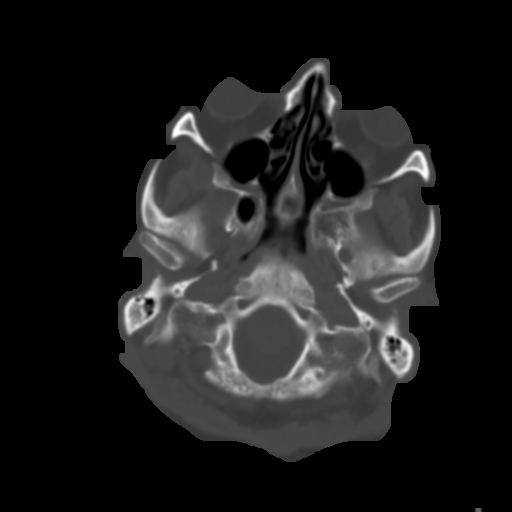
[im 8/31  brain]
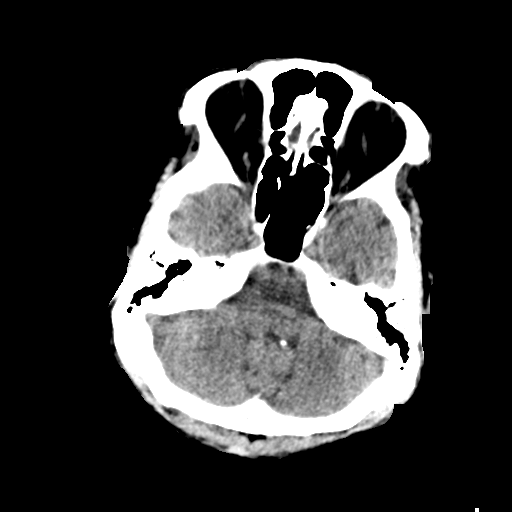
[im 12/31  brain]
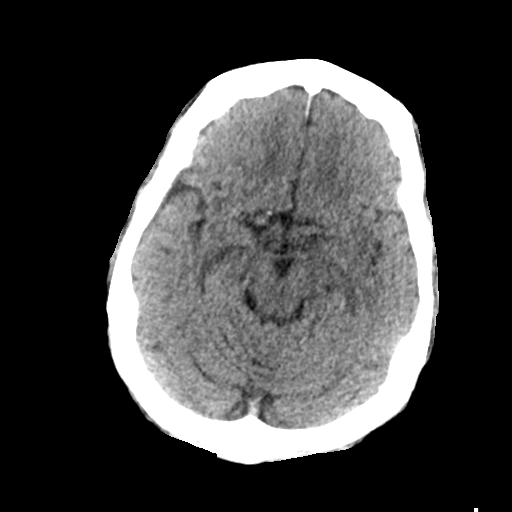
[im 16/31  brain]
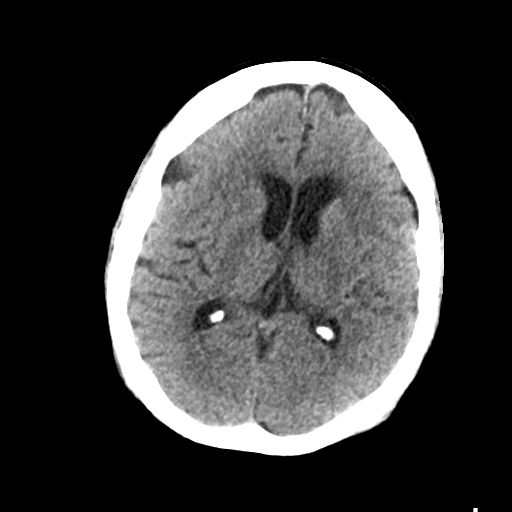
[im 19/31  brain]
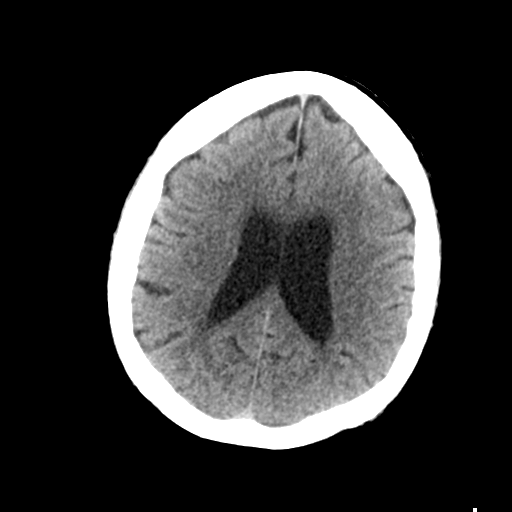
[im 19/31  bone]
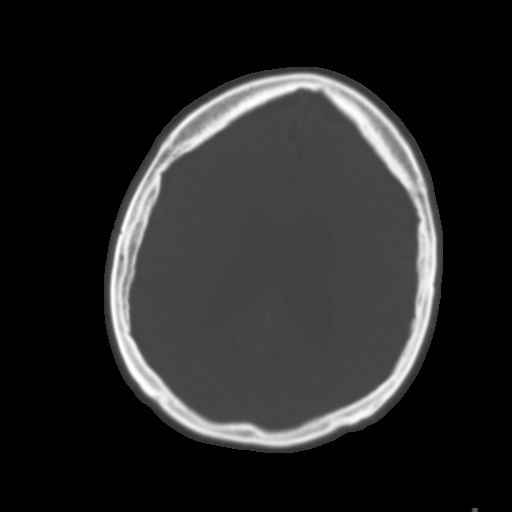
[im 23/31  brain]
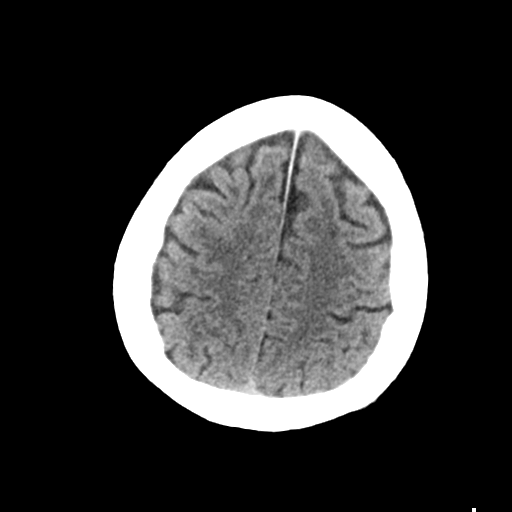
[im 27/31  brain]
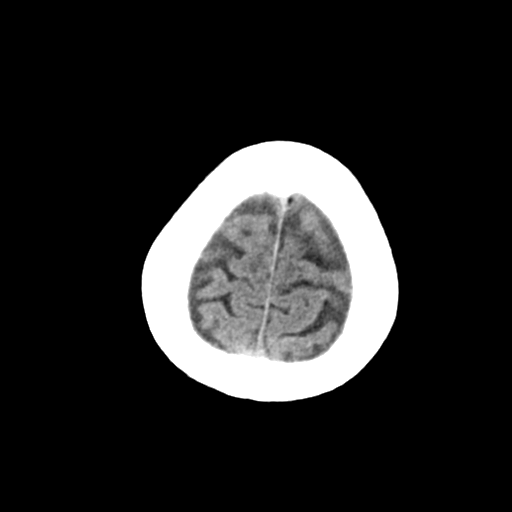

[Series 4: head bone · axial · 0.45mm/px · z∈[-29,-13]mm · 2 of 78 slices shown]
[im 8/78  bone]
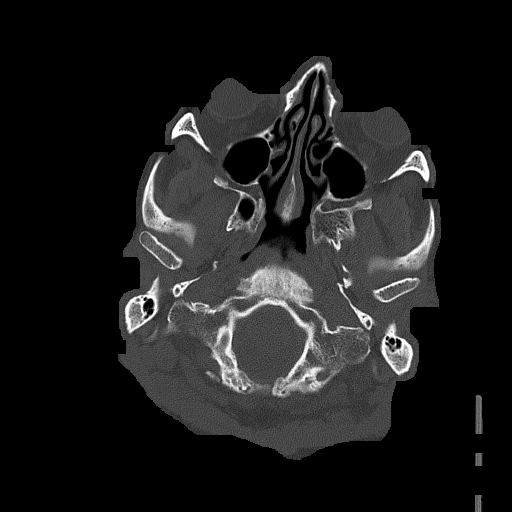
[im 16/78  bone]
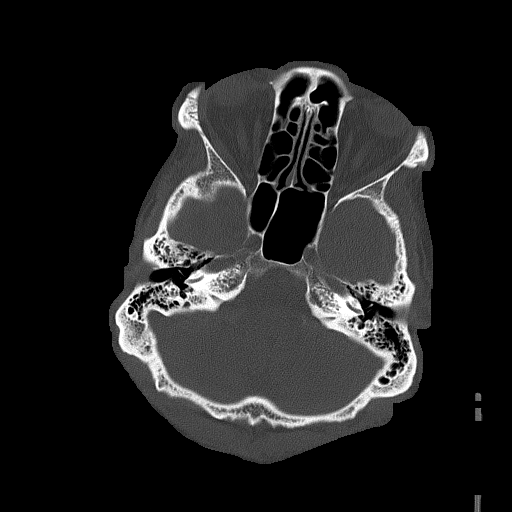

[Series 5: head without cor · coronal · non-contrast · 0.30mm/px · 3 of 67 slices shown]
[im 23/67  brain]
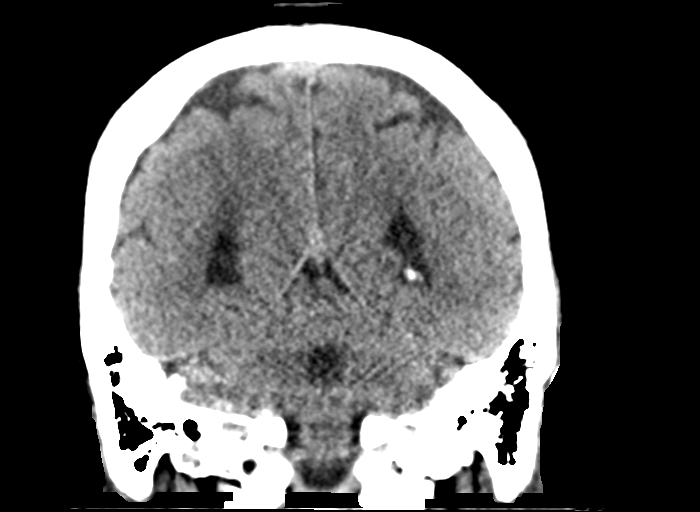
[im 30/67  brain]
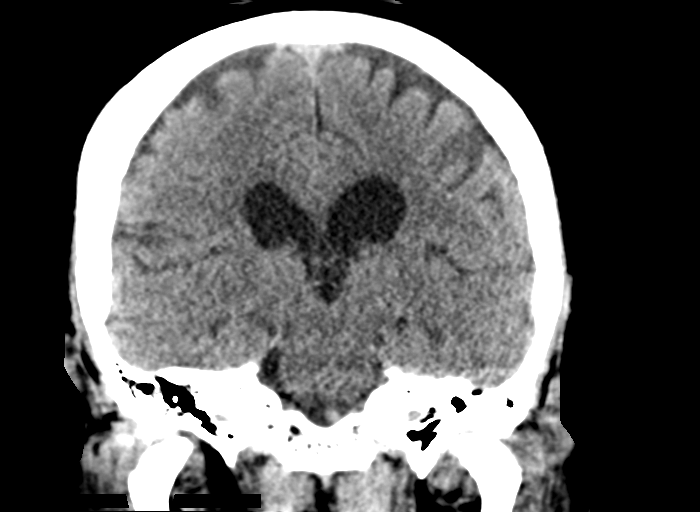
[im 37/67  brain]
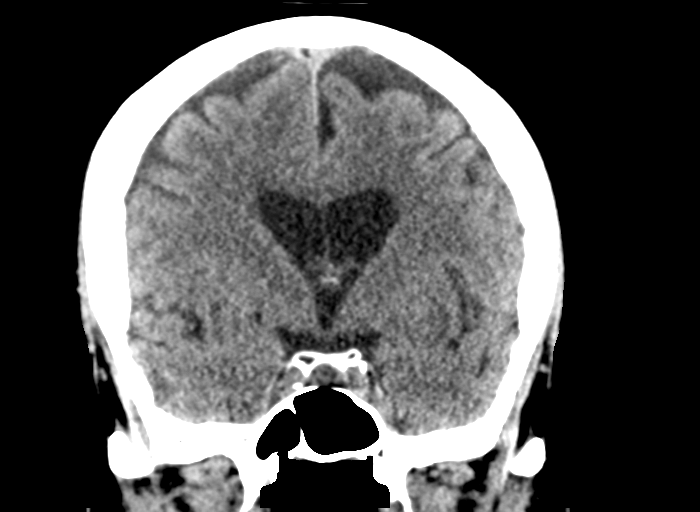

[Series 6: head without sag · sagittal · non-contrast · 0.30mm/px · 3 of 67 slices shown]
[im 23/67  brain]
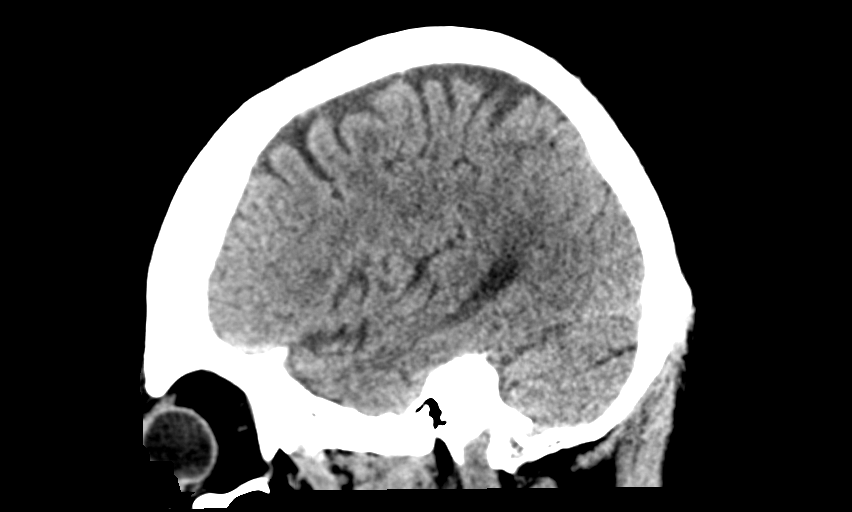
[im 34/67  brain]
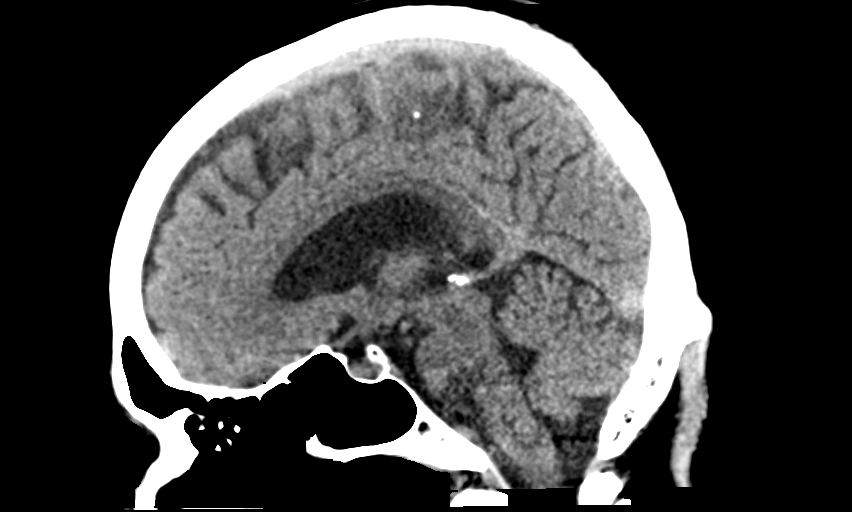
[im 45/67  brain]
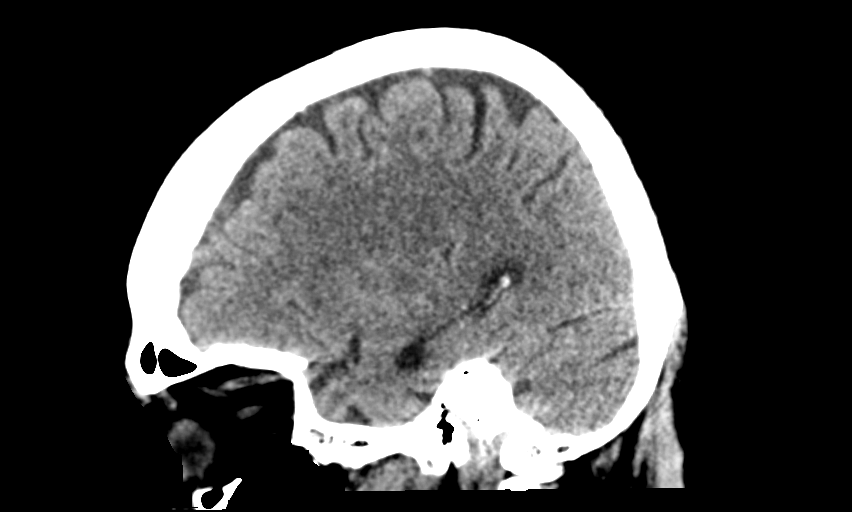

[15 of 47 positions shown; findings below may reference images not displayed]

FINDINGS: Brain: No subdural, epidural, or subarachnoid hemorrhage noted.
Cerebellum, brainstem, and basal cisterns noted. Ventricles and
sulci are unchanged with no acute abnormality. No acute cortical
ischemia infarct. No mass effect or midline shift.

Vascular: Calcified atherosclerosis in the intracranial carotids.

Skull: Normal. Negative for fracture or focal lesion.

Sinuses/Orbits: No acute finding.

Other: None.
IMPRESSION: No acute intracranial abnormalities.

## 2020-03-14 DIAGNOSIS — H401112 Primary open-angle glaucoma, right eye, moderate stage: Secondary | ICD-10-CM | POA: Diagnosis not present

## 2020-03-14 DIAGNOSIS — H401123 Primary open-angle glaucoma, left eye, severe stage: Secondary | ICD-10-CM | POA: Diagnosis not present

## 2020-03-23 DIAGNOSIS — C61 Malignant neoplasm of prostate: Secondary | ICD-10-CM | POA: Diagnosis not present

## 2020-04-03 DIAGNOSIS — N184 Chronic kidney disease, stage 4 (severe): Secondary | ICD-10-CM | POA: Diagnosis not present

## 2020-04-03 DIAGNOSIS — N2581 Secondary hyperparathyroidism of renal origin: Secondary | ICD-10-CM | POA: Diagnosis not present

## 2020-04-03 DIAGNOSIS — I129 Hypertensive chronic kidney disease with stage 1 through stage 4 chronic kidney disease, or unspecified chronic kidney disease: Secondary | ICD-10-CM | POA: Diagnosis not present

## 2020-04-03 DIAGNOSIS — D631 Anemia in chronic kidney disease: Secondary | ICD-10-CM | POA: Diagnosis not present

## 2020-04-03 DIAGNOSIS — F0390 Unspecified dementia without behavioral disturbance: Secondary | ICD-10-CM | POA: Diagnosis not present

## 2020-04-03 DIAGNOSIS — I739 Peripheral vascular disease, unspecified: Secondary | ICD-10-CM | POA: Diagnosis not present

## 2020-04-03 DIAGNOSIS — I5030 Unspecified diastolic (congestive) heart failure: Secondary | ICD-10-CM | POA: Diagnosis not present

## 2020-04-03 DIAGNOSIS — N189 Chronic kidney disease, unspecified: Secondary | ICD-10-CM | POA: Diagnosis not present

## 2020-04-03 DIAGNOSIS — E785 Hyperlipidemia, unspecified: Secondary | ICD-10-CM | POA: Diagnosis not present

## 2020-04-03 DIAGNOSIS — C61 Malignant neoplasm of prostate: Secondary | ICD-10-CM | POA: Diagnosis not present

## 2020-04-03 DIAGNOSIS — K219 Gastro-esophageal reflux disease without esophagitis: Secondary | ICD-10-CM | POA: Diagnosis not present

## 2020-04-06 ENCOUNTER — Other Ambulatory Visit: Payer: Self-pay | Admitting: Nephrology

## 2020-04-06 DIAGNOSIS — N189 Chronic kidney disease, unspecified: Secondary | ICD-10-CM

## 2020-04-06 DIAGNOSIS — N184 Chronic kidney disease, stage 4 (severe): Secondary | ICD-10-CM

## 2020-04-13 ENCOUNTER — Ambulatory Visit: Payer: Medicare HMO

## 2020-04-14 ENCOUNTER — Other Ambulatory Visit: Payer: Self-pay

## 2020-04-14 ENCOUNTER — Encounter: Payer: Self-pay | Admitting: Internal Medicine

## 2020-04-14 ENCOUNTER — Ambulatory Visit (INDEPENDENT_AMBULATORY_CARE_PROVIDER_SITE_OTHER): Payer: Medicare HMO | Admitting: Internal Medicine

## 2020-04-14 VITALS — BP 132/70 | HR 81 | Temp 98.5°F | Resp 16 | Ht 74.0 in | Wt 173.1 lb

## 2020-04-14 DIAGNOSIS — C61 Malignant neoplasm of prostate: Secondary | ICD-10-CM

## 2020-04-14 DIAGNOSIS — E441 Mild protein-calorie malnutrition: Secondary | ICD-10-CM

## 2020-04-14 DIAGNOSIS — R109 Unspecified abdominal pain: Secondary | ICD-10-CM | POA: Diagnosis not present

## 2020-04-14 NOTE — Progress Notes (Signed)
Patient ID: Patrick Krol., male    DOB: 05/08/1932, 86 y.o.   MRN: [id number]  PCP: Steele Sizer, MD  Chief Complaint  Patient presents with  . stomach cramps    Started yesterday 8/5, was sitting in hot car and felt stomach cramps on his left side, the pain went away and comes back off and on, seems to happen after he is int he sun for a long time or eats food    Subjective:   Patrick P Larson Limones. is a 86 y.o. male, presents to clinic with CC of the following:  Chief Complaint  Patient presents with  . stomach cramps    Started yesterday 8/5, was sitting in hot car and felt stomach cramps on his left side, the pain went away and comes back off and on, seems to happen after he is int he sun for a long time or eats food    HPI:  Patient is an 86 year old male patient of Dr. Ancil Boozer Patient's wife with him here today  He is currently restarting Lupron injections for prostate cancer treatment, last received 03/23/2020.  It was noted he has not felt well on androgen deprivation therapy in the past, felt much better off of it. Most recent bone scan is as follows: Bone Scan 02/23/20:  No findings felt to be indicative of bony metastatic disease. Areas  of arthropathy in major joints. Question mild fullness of the renal  collecting systems.    Last visit with Dr. Ancil Boozer was 01/10/2020 with the following noted: Malnutrition: his weight has gone down from 220 lbs average to 182 lbs for a while and past two visit 178 lbs and 177 lbs. He  has temporal waisting. His wife is giving him protein shakes intermittently. Difficulty to increase protein intake because of his kidney dysfunction   Major Depression he was taking zoloft but wife stopped giving him medication , phq 9 is positive again, but wife states he is fine   Wt Readings from Last 3 Encounters:  04/14/20 173 lb 1.6 oz (78.5 kg)  01/10/20 177 lb 6.4 oz (80.5 kg)  07/12/19 178 lb 3.2 oz (80.8 kg)   Weight is down  some from last check in May  Follows up today with increasing stomach pain.  He describes the pain as cramping type pain, is intermittent, and started yesterday, with him first noticing it sitting in a hot car.  Last night he felt some pain more up in the epigastric area at times of the chest, with that history were given by the wife and the patient. He noted this morning he felt good, although after breakfast, had a little of the cramping on the left side of his abdomen again, although not marked, went away, and has no symptoms presently. He denies any fevers, no nausea or vomiting, no severe pains, no diarrhea or loose bowel movements, no constipation symptoms, no bleeding per rectum or dark or black stools. Tries to stay bland with his diet in general.   Patient Active Problem List   Diagnosis Date Noted  . Mild major depression ([place]) 01/10/2020  . CKD (chronic kidney disease) stage 3, GFR 30-59 ml/min 04/05/2019  . Metabolic acidosis, normal anion gap (NAG) 07/27/2018  . Dementia without behavioral disturbance ([place]) 07/27/2018  . Glaucoma 07/27/2018  . Syncope 07/27/2018  . Secondary hyperparathyroidism ([place]) 11/26/2016  . Mild cognitive impairment 01/03/2016  . Elevated prostate specific antigen (PSA) 09/08/2015  . Bone pain 06/13/2015  .  Spinal stenosis 06/13/2015  . OP (osteoporosis) 03/20/2015  . Allergic rhinitis 03/20/2015  . Lumbar canal stenosis 03/20/2015  . Vitamin D deficiency 03/20/2015  . Restless legs syndrome 03/20/2015  . Drug-induced gynecomastia 03/20/2015  . Diaphragmatic hernia 03/20/2015  . Acquired cyst of kidney 03/20/2015  . Left ventricular hypertrophy by electrocardiogram 03/20/2015  . Leg pain 03/20/2015  . History of pneumonia 03/20/2015  . Non-rheumatic tricuspid valve insufficiency 03/20/2015  . Dysmetabolic syndrome 21/19/4174  . Pulmonary hypertension ([place]) 10/31/2014  . Benign essential HTN 07/29/2014  . Dyslipidemia 07/29/2014  . PVD  (peripheral vascular disease) ([place])   . Anemia of chronic disease   . IBS (irritable bowel syndrome)   . Bradycardia   . ED (erectile dysfunction) of organic origin 06/29/2012  . Genuine stress incontinence, male 06/29/2012  . Malignant neoplasm of prostate ([place]) 06/29/2012      Current Outpatient Medications:  .  amLODipine (NORVASC) 10 MG tablet, Take 1 tablet (10 mg total) by mouth daily., Disp: 90 tablet, Rfl: 1 .  aspirin 81 MG tablet, Take 81 mg by mouth daily. , Disp: , Rfl:  .  atorvastatin (LIPITOR) 40 MG tablet, TAKE 1 TABLET EVERY EVENING, Disp: 90 tablet, Rfl: 1 .  calcitRIOL (ROCALTROL) 0.25 MCG capsule, every other day. , Disp: , Rfl:  .  hydrALAZINE (APRESOLINE) 25 MG tablet, TAKE 1 TABLET TWICE DAILY, Disp: 180 tablet, Rfl: 1 .  vitamin C (ASCORBIC ACID) 500 MG tablet, Take 500 mg by mouth daily., Disp: , Rfl:  .  Vitamin E 400 UNITS CHEW, Chew 400 Units by mouth daily. , Disp: , Rfl:    Allergies  Allergen Reactions  . Ace Inhibitors Cough    cough     Past Surgical History:  Procedure Laterality Date  . BASCILIC VEIN TRANSPOSITION Left 09/05/2016   Procedure: FIRST STAGE BASILIC VEIN TRANSPOSITION left arm;  Surgeon: Serafina Mitchell, MD;  Location: [facility];  Service: Vascular;  Laterality: Left;  . BASCILIC VEIN TRANSPOSITION Left 11/14/2016   Procedure: BASCILIC VEIN TRANSPOSITION-LEFT ARM 2ND STAGE;  Surgeon: Serafina Mitchell, MD;  Location: [facility];  Service: Vascular;  Laterality: Left;  . COLONOSCOPY    . EYE SURGERY Left 09/11/2017  . PROSTATE SURGERY    . PROSTATECTOMY       Family History  Problem Relation Age of Onset  . CVA Mother   . Hypertension Mother   . Dementia Mother   . Heart attack Father   . Heart disease Father        before age 60  . Hypertension Father   . Hepatitis C Sister   . Dementia Sister      Social History   Tobacco Use  . Smoking status: Former Smoker    Packs/day: 0.30    Years: 20.00    Pack years: 6.00     Types: Cigarettes    Quit date: 08/26/1978    Years since quitting: 41.6  . Smokeless tobacco: Never Used  . Tobacco comment: smoking cessation materials not required  Substance Use Topics  . Alcohol use: No    Alcohol/week: 0.0 standard drinks    With staff assistance, above reviewed with the patient/caregiver today.  ROS: As per HPI, otherwise no specific complaints on a limited and focused system review   No results found for this or any previous visit (from the past 72 hour(s)).   PHQ2/9: Depression screen [facility] 2/9 04/14/2020 01/28/2020 01/10/2020 07/12/2019 04/13/2019  Decreased Interest 0 0 2  0 0  Down, Depressed, Hopeless 0 0 0 0 0  PHQ - 2 Score 0 0 2 0 0  Altered sleeping 0 0 0 0 -  Tired, decreased energy 0 0 1 0 -  Change in appetite 0 0 2 0 -  Feeling bad or failure about yourself  0 0 0 0 -  Trouble concentrating 0 0 1 0 -  Moving slowly or fidgety/restless 0 0 0 0 -  Suicidal thoughts 0 0 0 0 -  PHQ-9 Score 0 0 6 0 -  Difficult doing work/chores Not difficult at all - Somewhat difficult - -  Some recent data might be hidden   PHQ-2/9 Result is   Fall Risk: Fall Risk  04/14/2020 01/28/2020 01/10/2020 07/12/2019 04/13/2019  Falls in the past year? 0 0 0 0 0  Number falls in past yr: 0 0 0 0 0  Injury with Fall? 0 0 0 0 0  Risk for fall due to : - - - - Impaired vision  Risk for fall due to: Comment - - - - -  Follow up - - - - Falls prevention discussed      Objective:   Vitals:   04/14/20 1421  BP: 132/70  Pulse: 81  Resp: 16  Temp: 98.5 F (36.9 C)  TempSrc: Temporal  SpO2: 97%  Weight: 173 lb 1.6 oz (78.5 kg)  Height: 6\' 2"  (1.88 m)    Body mass index is 22.22 kg/m.  Physical Exam   NAD, masked, thin appearing HEENT - [place]/AT, sclera anicteric, PERRL, conj - non-inj'ed,  pharynx clear Neck - supple, no adenopathy, carotids 2+ and = without bruits bilat Car - RRR without m/g/r Pulm- RR and effort normal at rest, CTA without wheeze or rales Abd - soft,  NT, ND, BS+,  no masses Back - no CVA tenderness Ext - no LE edema,  Neuro/psychiatric - affect was not flat, appropriate with conversation, answers to questions were short and direct  Alert with speech normal  Results for orders placed or performed in visit on 01/10/20  CBC with Differential/Platelet  Result Value Ref Range   WBC 3.7 (L) 3.8 - 10.8 Thousand/uL   RBC 3.37 (L) 4.20 - 5.80 Million/uL   Hemoglobin 10.6 (L) 13.2 - 17.1 g/dL   HCT 31.7 (L) 38 - 50 %   MCV 94.1 80.0 - 100.0 fL   MCH 31.5 27.0 - 33.0 pg   MCHC 33.4 32.0 - 36.0 g/dL   RDW 12.4 11.0 - 15.0 %   Platelets 140 140 - 400 Thousand/uL   MPV 11.2 7.5 - 12.5 fL   Neutro Abs 1,754 1,500 - 7,800 cells/uL   Lymphs Abs 1,432 850 - 3,900 cells/uL   Absolute Monocytes 374 200 - 950 cells/uL   Eosinophils Absolute 111 15 - 500 cells/uL   Basophils Absolute 30 0 - 200 cells/uL   Neutrophils Relative % 47.4 %   Total Lymphocyte 38.7 %   Monocytes Relative 10.1 %   Eosinophils Relative 3.0 %   Basophils Relative 0.8 %  COMPLETE METABOLIC PANEL WITH GFR  Result Value Ref Range   Glucose, Bld 108 (H) 65 - 99 mg/dL   BUN 43 (H) 7 - 25 mg/dL   Creat 3.69 (H) 0.70 - 1.11 mg/dL   GFR, Est Non African American 14 (L) > OR = 60 mL/min/1.28m2   GFR, Est African American 16 (L) > OR = 60 mL/min/1.25m2   BUN/Creatinine Ratio 12 6 - 22 (  calc)   Sodium 142 135 - 146 mmol/L   Potassium 5.2 3.5 - 5.3 mmol/L   Chloride 109 98 - 110 mmol/L   CO2 25 20 - 32 mmol/L   Calcium 10.2 8.6 - 10.3 mg/dL   Total Protein 7.5 6.1 - 8.1 g/dL   Albumin 4.2 3.6 - 5.1 g/dL   Globulin 3.3 1.9 - 3.7 g/dL (calc)   AG Ratio 1.3 1.0 - 2.5 (calc)   Total Bilirubin 0.5 0.2 - 1.2 mg/dL   Alkaline phosphatase (APISO) 72 35 - 144 U/L   AST 12 10 - 35 U/L   ALT 12 9 - 46 U/L  Parathyroid hormone, intact (no Ca)  Result Value Ref Range   PTH 185 (H) 14 - 64 pg/mL  VITAMIN D 25 Hydroxy (Vit-D Deficiency, Fractures)  Result Value Ref Range   Vit D,  25-Hydroxy 12 (L) 30 - 100 ng/mL       Assessment & Plan:   1. Abdominal pain, unspecified abdominal location More cramping than pain, intermittent, and exact source unclear.  Not having symptoms presently.  Exam unremarkable today. Noted the differential of the potential causes is quite large, and at this time the importance of monitoring. Recommended staying well-hydrated, staying bland with his diet. Also monitoring for other symptoms, and if starts to have fevers, nausea or vomiting, increasing pains, any dark or black stools or bleeding per rectum, follow-up again as needed.  May need to be seen more emergently in an emergency room setting if the symptoms are marked. Is avoiding spicy foods, as did discuss potential reflux component later yesterday, and continue doing so and staying bland with the diet encouraged.   2. Prostate cancer ([place]) His PSA was increasing again, and Lupron injections initiated again.  He noted he did not feel as well on that previously, and may be a contributor.  3. Mild protein-calorie malnutrition ([place]) Continued weight loss has been noted in his recent past, and concerns noted with progression of his prostate cancer a major concern with the increase in PSAs. He continues to follow with urology in that realm.  Do not feel rushing off for further imaging, or further lab tests is needed today, and he had some mild symptoms after breakfast but none since. We will continue to monitor presently, with the above recommendations, with further follow-up as needed as noted above.     Towanda Malkin, MD 04/14/20 2:35 PM

## 2020-04-14 NOTE — Patient Instructions (Signed)
Continue to closely monitor as we discussed today  Stay well-hydrated, and stay bland with your diet felt best.

## 2020-04-24 ENCOUNTER — Other Ambulatory Visit: Payer: Self-pay | Admitting: Family Medicine

## 2020-04-24 DIAGNOSIS — E785 Hyperlipidemia, unspecified: Secondary | ICD-10-CM

## 2020-04-24 DIAGNOSIS — I129 Hypertensive chronic kidney disease with stage 1 through stage 4 chronic kidney disease, or unspecified chronic kidney disease: Secondary | ICD-10-CM

## 2020-04-24 DIAGNOSIS — N183 Chronic kidney disease, stage 3 unspecified: Secondary | ICD-10-CM

## 2020-04-24 MED ORDER — ATORVASTATIN CALCIUM 40 MG PO TABS: 40.0000 mg | ORAL_TABLET | Freq: Every evening | ORAL | 1 refills | Status: DC

## 2020-04-24 MED ORDER — AMLODIPINE BESYLATE 10 MG PO TABS: 10.0000 mg | ORAL_TABLET | Freq: Every day | ORAL | 1 refills | Status: DC

## 2020-04-24 MED ORDER — HYDRALAZINE HCL 25 MG PO TABS: 25.0000 mg | ORAL_TABLET | Freq: Two times a day (BID) | ORAL | 1 refills | Status: DC

## 2020-04-24 NOTE — Telephone Encounter (Signed)
Requested Prescriptions  Pending Prescriptions Disp Refills  . amLODipine (NORVASC) 10 MG tablet 90 tablet 1    Sig: Take 1 tablet (10 mg total) by mouth daily.     Cardiovascular:  Calcium Channel Blockers Passed - 04/24/2020 12:05 PM      Passed - Last BP in normal range    BP Readings from Last 1 Encounters:  04/14/20 132/70         Passed - Valid encounter within last 6 months    Recent Outpatient Visits          1 week ago Abdominal pain, unspecified abdominal location   [place], [place]   3 months ago Secondary hyperparathyroidism of renal origin [facility])   [facility] Steele Sizer, MD   9 months ago Pulmonary hypertension [facility])   [facility] Steele Sizer, MD   1 year ago Thrombocytopenia [facility])   [facility] Steele Sizer, MD   1 year ago Benign essential HTN   [facility] Steele Sizer, MD      Future Appointments            In 1 month  [facility], [place]   In 1 month [place], MD [place], [facility]   In 2 months Steele Sizer, MD [facility], [place]           . atorvastatin (LIPITOR) 40 MG tablet 90 tablet 1    Sig: Take 1 tablet (40 mg total) by mouth every evening.     Cardiovascular:  Antilipid - Statins Passed - 04/24/2020 12:05 PM      Passed - Total Cholesterol in normal range and within 360 days    Cholesterol, Total  Date Value Ref Range Status  09/27/2015 233 (H) 100 - 199 mg/dL Final   Cholesterol  Date Value Ref Range Status  07/12/2019 135 <200 mg/dL Final         Passed - LDL in normal range and within 360 days    LDL Cholesterol (Calc)  Date Value Ref Range Status  07/12/2019 56 mg/dL (calc) Final    Comment:    Reference range: <100 . Desirable range <100 mg/dL for primary prevention;   <70 mg/dL for patients with CHD or diabetic patients   with > or = 2 CHD risk factors. [name] Kitchen LDL-C is now calculated using the Martin-Hopkins  calculation, which is a validated novel method providing  better accuracy than the Friedewald equation in the  estimation of LDL-C.  Cresenciano Genre et al. Annamaria Helling. 7867;672(09): 2061-2068  (http://education.[employer].com/faq/FAQ164)          Passed - HDL in normal range and within 360 days    HDL  Date Value Ref Range Status  07/12/2019 64 > OR = 40 mg/dL Final  09/27/2015 78 >39 mg/dL Final         Passed - Triglycerides in normal range and within 360 days    Triglycerides  Date Value Ref Range Status  07/12/2019 69 <150 mg/dL Final         Passed - Patient is not pregnant      Passed - Valid encounter within last 12 months    Recent Outpatient Visits          1 week ago Abdominal pain, unspecified abdominal location   [place], MD   3 months ago Secondary hyperparathyroidism  of renal origin [facility])   [facility] Steele Sizer, MD   9 months ago Pulmonary hypertension [facility])   [facility] Steele Sizer, MD   1 year ago Thrombocytopenia [facility])   [facility] Steele Sizer, MD   1 year ago Benign essential HTN   [facility] Steele Sizer, MD      Future Appointments            In 1 month  [facility], [place]   In 1 month Sueanne Margarita, MD [place], [facility]   In 2 months Steele Sizer, MD [facility], [facility]           . hydrALAZINE (APRESOLINE) 25 MG tablet 180 tablet 1    Sig: Take 1 tablet (25 mg total) by mouth 2 (two) times daily.     Cardiovascular:  Vasodilators Failed - 04/24/2020 12:05 PM      Failed - HCT in normal range and within 360 days    HCT  Date Value Ref Range Status  01/10/2020 31.7 (L) 38 - 50 % Final   Hematocrit  Date Value Ref Range Status  09/27/2015 35.8  (L) 37.5 - 51.0 % Final         Failed - HGB in normal range and within 360 days    Hemoglobin  Date Value Ref Range Status  01/10/2020 10.6 (L) 13.2 - 17.1 g/dL Final         Failed - RBC in normal range and within 360 days    RBC  Date Value Ref Range Status  01/10/2020 3.37 (L) 4.20 - 5.80 Million/uL Final         Failed - WBC in normal range and within 360 days    WBC  Date Value Ref Range Status  01/10/2020 3.7 (L) 3.8 - 10.8 Thousand/uL Final         Passed - PLT in normal range and within 360 days    Platelets  Date Value Ref Range Status  01/10/2020 140 140 - 400 Thousand/uL Final         Passed - Last BP in normal range    BP Readings from Last 1 Encounters:  04/14/20 132/70         Passed - Valid encounter within last 12 months    Recent Outpatient Visits          1 week ago Abdominal pain, unspecified abdominal location   [facility] [place] Con[facility] D, MD   3 months ago Secondary hyperparathyroidism of renal origin [facility])   [facility] Steele Sizer, MD   9 months ago Pulmonary hypertension [facility])   [facility] Steele Sizer, MD   1 year ago Thrombocytopenia [facility])   [facility] Steele Sizer, MD   1 year ago Benign essential HTN   [facility] Steele Sizer, MD      Future Appointments            In 1 month  [facility], [place]   In 1 month [place], Eber Hong, MD [place], [facility]   In 2 months Steele Sizer, MD [facility], [facility]

## 2020-04-24 NOTE — Telephone Encounter (Signed)
Medication Refill - Medication: amLODipine (NORVASC) 10 MG tablet atorvastatin (LIPITOR) 40 MG tablet hydrALAZINE (APRESOLINE) 25 MG tablet     Preferred Pharmacy (with phone number or street name):  [facility], [place] Phone:  [phone]  Fax:  [phone]       Agent: Please be advised that RX refills may take up to 3 business days. We ask that you follow-up with your pharmacy.

## 2020-05-23 ENCOUNTER — Encounter: Payer: Self-pay | Admitting: Family Medicine

## 2020-05-30 ENCOUNTER — Ambulatory Visit: Payer: Medicare HMO

## 2020-06-12 ENCOUNTER — Ambulatory Visit (INDEPENDENT_AMBULATORY_CARE_PROVIDER_SITE_OTHER): Payer: Medicare HMO

## 2020-06-12 ENCOUNTER — Other Ambulatory Visit: Payer: Self-pay

## 2020-06-12 ENCOUNTER — Ambulatory Visit (INDEPENDENT_AMBULATORY_CARE_PROVIDER_SITE_OTHER): Payer: Medicare HMO | Admitting: Cardiology

## 2020-06-12 ENCOUNTER — Encounter: Payer: Self-pay | Admitting: Cardiology

## 2020-06-12 VITALS — BP 158/72 | Ht 74.0 in | Wt 174.0 lb

## 2020-06-12 DIAGNOSIS — R9431 Abnormal electrocardiogram [ECG] [EKG]: Secondary | ICD-10-CM

## 2020-06-12 DIAGNOSIS — I272 Pulmonary hypertension, unspecified: Secondary | ICD-10-CM | POA: Diagnosis not present

## 2020-06-12 DIAGNOSIS — M79605 Pain in left leg: Secondary | ICD-10-CM

## 2020-06-12 DIAGNOSIS — I10 Essential (primary) hypertension: Secondary | ICD-10-CM | POA: Diagnosis not present

## 2020-06-12 DIAGNOSIS — Z23 Encounter for immunization: Secondary | ICD-10-CM

## 2020-06-12 DIAGNOSIS — M79604 Pain in right leg: Secondary | ICD-10-CM | POA: Diagnosis not present

## 2020-06-12 DIAGNOSIS — N1832 Chronic kidney disease, stage 3b: Secondary | ICD-10-CM

## 2020-06-12 MED ORDER — HYDRALAZINE HCL 25 MG PO TABS: 25.0000 mg | ORAL_TABLET | Freq: Three times a day (TID) | ORAL | 3 refills | Status: DC

## 2020-06-12 NOTE — Patient Instructions (Signed)
Please take your blood pressure daily for one week and call us with a list of your readings.   Medication Instructions:  Your physician has recommended you make the following change in your medication:  1) INCREASE hydralazine to 25 mg three times per day (take 1 tablet at 9am, 1 tablet at 3pm, and 1 tablet at 9pm)  *If you need a refill on your cardiac medications before your next appointment, please call your pharmacy*  Follow-Up: At [facility], you and your health needs are our priority.  As part of our continuing mission to provide you with exceptional heart care, we have created designated Provider Care Teams.  These Care Teams include your primary Cardiologist (physician) and Advanced Practice Providers (APPs -  Physician Assistants and Nurse Practitioners) who all work together to provide you with the care you need, when you need it.  We recommend signing up for the patient portal called "MyChart".  Sign up information is provided on this After Visit Summary.  MyChart is used to connect with patients for Virtual Visits (Telemedicine).  Patients are able to view lab/test results, encounter notes, upcoming appointments, etc.  Non-urgent messages can be sent to your provider as well.   To learn more about what you can do with MyChart, go to [email or web address].    Your next appointment:   1 year(s)  The format for your next appointment:   In Person  Provider:   You may see Fransico Him, MD or one of the following Advanced Practice Providers on your designated Care Team:    Melina Copa, PA-C  Ermalinda Barrios, PA-C

## 2020-06-12 NOTE — Progress Notes (Signed)
Cardiology Office Note:    Date:  06/12/2020   ID:  Patrick Bellows., DOB 1932-05-31, MRN [id number]  PCP:  Patrick Sizer, MD  Cardiologist:  No primary care provider on file.    Referring MD: Patrick Sizer, MD   Chief Complaint  Patient presents with  . Follow-up    Pulmonary HTN, HTN    History of Present Illness:    Patrick Walkowski. is a 86 y.o. male with a hx of HTN, PVD, CKD stage III status post AV fistula placement followed by nephrology but not started HD yet.  He also has a history of moderate pulmonary HTN (PASP 66mmHg on echo 2015) and dyslipidemia.  He has a chronically abnormal EKG with a normal Myoview in 2015.  He has chronic lower extremity edema which is controlled with compression hose.  He has been having progressive dementia symptoms placed on Aricept by neurology.  Chest CT showed no acute findings except mild generalized cortical atrophy normal for his age with mild chronic microvascular ischemic changes.  He is here today for followup and is doing well.  He denies any chest pain or pressure, DOE (except with extreme exertion),  PND, orthopnea,  dizziness, palpitations or syncope. He has chronic LE edema which is controlled with compression hose.  He is compliant with his meds and is tolerating meds with no SE.  [name] Kitchen    Past Medical History:  Diagnosis Date  . Anemia   . Arthritis   . Bradycardia   . CKD (chronic kidney disease), stage IV ([place])   . GERD (gastroesophageal reflux disease)   . High cholesterol   . Hypertension   . IBS (irritable bowel syndrome)   . Pneumonia   . Prostate cancer ([place])   . Pulmonary HTN ([place]) 11/05/2015   9mmHg on echo 2017  . PVD (peripheral vascular disease) ([place])   . Syncope 07/2018    Past Surgical History:  Procedure Laterality Date  . BASCILIC VEIN TRANSPOSITION Left 09/05/2016   Procedure: FIRST STAGE BASILIC VEIN TRANSPOSITION left arm;  Surgeon: Patrick Mitchell, MD;  Location: [facility];  Service: Vascular;   Laterality: Left;  . BASCILIC VEIN TRANSPOSITION Left 11/14/2016   Procedure: BASCILIC VEIN TRANSPOSITION-LEFT ARM 2ND STAGE;  Surgeon: Patrick Mitchell, MD;  Location: [facility];  Service: Vascular;  Laterality: Left;  . COLONOSCOPY    . EYE SURGERY Left 09/11/2017  . PROSTATE SURGERY    . PROSTATECTOMY      Current Medications: Current Meds  Medication Sig  . amLODipine (NORVASC) 10 MG tablet Take 1 tablet (10 mg total) by mouth daily.  [name] Kitchen aspirin 81 MG tablet Take 81 mg by mouth daily.   [name] Kitchen atorvastatin (LIPITOR) 40 MG tablet Take 1 tablet (40 mg total) by mouth every evening.  . calcitRIOL (ROCALTROL) 0.25 MCG capsule every other day.   . vitamin C (ASCORBIC ACID) 500 MG tablet Take 500 mg by mouth daily.  . Vitamin E 400 UNITS CHEW Chew 400 Units by mouth daily.   . [DISCONTINUED] hydrALAZINE (APRESOLINE) 25 MG tablet Take 1 tablet (25 mg total) by mouth 2 (two) times daily.     Allergies:   Ace inhibitors   Social History   Socioeconomic History  . Marital status: Married    Spouse name: Patrick [name]  . Number of children: 1  . Years of education: Not on file  . Highest education level: 6th grade  Occupational History  . Occupation: Retired   Tobacco  Use  . Smoking status: Former Smoker    Packs/day: 0.30    Years: 20.00    Pack years: 6.00    Types: Cigarettes    Quit date: 08/26/1978    Years since quitting: 41.8  . Smokeless tobacco: Never Used  . Tobacco comment: smoking cessation materials not required  Vaping Use  . Vaping Use: Never used  Substance and Sexual Activity  . Alcohol use: No    Alcohol/week: 0.0 standard drinks  . Drug use: No  . Sexual activity: Not Currently  Other Topics Concern  . Not on file  Social History Narrative   Lives w/ wife   Caffeine use: none   Right handed    He retired 07/2018 because of medical problems   Social Determinants of Health   Financial Resource Strain:   . Difficulty of Paying Living Expenses: Not on file  Food  Insecurity:   . Worried About Charity fundraiser in the Last Year: Not on file  . Ran Out of Food in the Last Year: Not on file  Transportation Needs:   . Lack of Transportation (Medical): Not on file  . Lack of Transportation (Non-Medical): Not on file  Physical Activity:   . Days of Exercise per Week: Not on file  . Minutes of Exercise per Session: Not on file  Stress:   . Feeling of Stress : Not on file  Social Connections:   . Frequency of Communication with Friends and Family: Not on file  . Frequency of Social Gatherings with Friends and Family: Not on file  . Attends Religious Services: Not on file  . Active Member of Clubs or Organizations: Not on file  . Attends Archivist Meetings: Not on file  . Marital Status: Not on file     Family History: The patient's family history includes CVA in his mother; Dementia in his mother and sister; Heart attack in his father; Heart disease in his father; Hepatitis C in his sister; Hypertension in his father and mother.  ROS:   Please see the history of present illness.    ROS  All other systems reviewed and negative.   EKGs/Labs/Other Studies Reviewed:    The following studies were reviewed today: none  EKG:  EKG is  ordered today.  The ekg ordered today demonstrates NSR with T wave inversions in V1 and V2.  T wave inversion in V2 is new  Recent Labs: 01/10/2020: ALT 12; BUN 43; Creat 3.69; Hemoglobin 10.6; Platelets 140; Potassium 5.2; Sodium 142   Recent Lipid Panel    Component Value Date/Time   CHOL 135 07/12/2019 0000   CHOL 233 (H) 09/27/2015 0958   TRIG 69 07/12/2019 0000   HDL 64 07/12/2019 0000   HDL 78 09/27/2015 0958   CHOLHDL 2.1 07/12/2019 0000   VLDL 13 03/28/2017 0945   LDLCALC 56 07/12/2019 0000    Physical Exam:    VS:  BP (!) 158/72   Ht 6\' 2"  (1.88 m)   Wt 174 lb (78.9 kg)   SpO2 98%   BMI 22.34 kg/m     Wt Readings from Last 3 Encounters:  06/12/20 174 lb (78.9 kg)  04/14/20 173 lb  1.6 oz (78.5 kg)  01/10/20 177 lb 6.4 oz (80.5 kg)     GEN:  Well nourished, well developed in no acute distress HEENT: Normal NECK: No JVD; No carotid bruits LYMPHATICS: No lymphadenopathy CARDIAC: RRR, no murmurs, rubs, gallops RESPIRATORY:  Clear to  auscultation without rales, wheezing or rhonchi  ABDOMEN: Soft, non-tender, non-distended MUSCULOSKELETAL:  No edema; No deformity  SKIN: Warm and dry NEUROLOGIC:  Alert and oriented x 3 PSYCHIATRIC:  Normal affect   ASSESSMENT:    1. Pulmonary hypertension ([place])   2. Benign essential HTN   3. Stage 3b chronic kidney disease (HCC)   4. Pain in both lower extremities   5. Nonspecific abnormal electrocardiogram (ECG) (EKG)    PLAN:    In order of problems listed above:  1.  Pulmonary HTN -moderate pulmonary HTN (PASP 58mmHg on echo 07/2019) -repeat echo 07/2020 -normal perfusion scan 08/2019 -PFTs were never done>>if PASP still high will need PFTs  2. Hypertension -BP is borderline controlled on exam today -continue on amlodipine 10mg   -increase Hydralazine to 25mg  TID -check Bp daily for a week and call with results.  -follow up with nephrology for more BP management  3. CKD stage 4 -followed by nephrology -his last creatinne was 3.07 in July 2021  4.  Pain in LE bilaterally -this has resolved -ABIs were normal in 2016 -LE arterial dopplers 04/2019 were normal  5.  Abnormal EKG -T wave inversions in V1 and V2 (V2 is new from prior EKG but asymptomatic) -lexi myoview with no ischemia in 2017 and asymptomatic  Medication Adjustments/Labs and Tests Ordered: Current medicines are reviewed at length with the patient today.  Concerns regarding medicines are outlined above.  No orders of the defined types were placed in this encounter.  Meds ordered this encounter  Medications  . hydrALAZINE (APRESOLINE) 25 MG tablet    Sig: Take 1 tablet (25 mg total) by mouth 3 (three) times daily.    Dispense:  270 tablet     Refill:  3    Signed, Fransico Him, MD  06/12/2020 1:39 PM    [facility]

## 2020-06-13 ENCOUNTER — Ambulatory Visit: Payer: Medicare HMO

## 2020-06-19 ENCOUNTER — Telehealth: Payer: Self-pay | Admitting: Cardiology

## 2020-06-19 NOTE — Telephone Encounter (Signed)
Pt is calling back with a log of his BP recordings in one week, as advised by Dr. Radford Pax, at last office visit with her on 06/12/20.  Below is why Dr. Radford Pax advised him to do so:  2. Hypertension -BP is borderline controlled on exam today -continue on amlodipine 10mg   -increase Hydralazine to 25mg  TID -check Bp daily for a week and call with results.  -follow up with nephrology for more BP management  Will route this message to Dr. Radford Pax and RN for further review, recommendation,  and follow-up with the pt thereafter.  Will also route his recordings to our Pharmacist in BP clinic, to receive their input as well.

## 2020-06-19 NOTE — Telephone Encounter (Signed)
Follow Up:  Wife calling with a week of pt's blood pressure readings as she was told tod.   06-14-20-  135/59  06-15-20-   158/81   06-16-20-   169/77    06-17-20-    169/77     06-18-20-   169/77      06-19-20-   169/77

## 2020-06-19 NOTE — Telephone Encounter (Signed)
Options are limited due to patient's CKD.  Consider increasing hydralazine to 50mg  TID unless Dr Radford Pax prefers nephrology follow up with patient

## 2020-06-20 NOTE — Telephone Encounter (Signed)
Called patient's wife and advised that Dr. Radford Pax will return to the office next week. I reviewed Patrick [name]'s advice with her and she states she would like to await further recommendation from Dr. Radford Pax. I advised her to monitor sodium and reduce any high sodium foods in patient's diet and to continue to monitor. She verbalized understanding and agreement and thanked me for the call.

## 2020-06-20 NOTE — Addendum Note (Signed)
Addended by: Patterson Hammersmith A on: 06/20/2020 02:11 PM   Modules accepted: Orders

## 2020-06-21 NOTE — Telephone Encounter (Signed)
Increase Hydralazine to 50mg  TID and check Bp daily for a week and call with results

## 2020-06-23 NOTE — Telephone Encounter (Signed)
Called patient's wife to review Dr. Theodosia Blender advice. She states since I talked to her on Tuesday that the patient's BP readings have improved: Wednesday 124/62 Thursday 132/65 I advised her to continue to monitor at current dose of hydralazine 25 mg TID and call back in 1 week to report patient's BP readings. She verbalized understanding and agreement and thanked me for the call.

## 2020-06-27 ENCOUNTER — Ambulatory Visit: Payer: Medicare HMO

## 2020-07-14 ENCOUNTER — Ambulatory Visit (INDEPENDENT_AMBULATORY_CARE_PROVIDER_SITE_OTHER): Payer: Medicare HMO | Admitting: Family Medicine

## 2020-07-14 ENCOUNTER — Other Ambulatory Visit: Payer: Self-pay

## 2020-07-14 ENCOUNTER — Encounter: Payer: Self-pay | Admitting: Family Medicine

## 2020-07-14 VITALS — BP 130/60 | HR 88 | Temp 97.6°F | Resp 16 | Ht 74.0 in | Wt 169.2 lb

## 2020-07-14 DIAGNOSIS — F320 Major depressive disorder, single episode, mild: Secondary | ICD-10-CM | POA: Diagnosis not present

## 2020-07-14 DIAGNOSIS — N184 Chronic kidney disease, stage 4 (severe): Secondary | ICD-10-CM

## 2020-07-14 DIAGNOSIS — F0390 Unspecified dementia without behavioral disturbance: Secondary | ICD-10-CM | POA: Diagnosis not present

## 2020-07-14 DIAGNOSIS — E785 Hyperlipidemia, unspecified: Secondary | ICD-10-CM

## 2020-07-14 DIAGNOSIS — I129 Hypertensive chronic kidney disease with stage 1 through stage 4 chronic kidney disease, or unspecified chronic kidney disease: Secondary | ICD-10-CM

## 2020-07-14 DIAGNOSIS — C61 Malignant neoplasm of prostate: Secondary | ICD-10-CM | POA: Diagnosis not present

## 2020-07-14 DIAGNOSIS — I5189 Other ill-defined heart diseases: Secondary | ICD-10-CM | POA: Diagnosis not present

## 2020-07-14 DIAGNOSIS — D638 Anemia in other chronic diseases classified elsewhere: Secondary | ICD-10-CM | POA: Diagnosis not present

## 2020-07-14 DIAGNOSIS — I272 Pulmonary hypertension, unspecified: Secondary | ICD-10-CM

## 2020-07-14 DIAGNOSIS — N183 Chronic kidney disease, stage 3 unspecified: Secondary | ICD-10-CM

## 2020-07-14 DIAGNOSIS — E441 Mild protein-calorie malnutrition: Secondary | ICD-10-CM | POA: Diagnosis not present

## 2020-07-14 DIAGNOSIS — I10 Essential (primary) hypertension: Secondary | ICD-10-CM | POA: Diagnosis not present

## 2020-07-14 DIAGNOSIS — I200 Unstable angina: Secondary | ICD-10-CM

## 2020-07-14 MED ORDER — ATORVASTATIN CALCIUM 40 MG PO TABS: 40.0000 mg | ORAL_TABLET | Freq: Every evening | ORAL | 1 refills | Status: DC

## 2020-07-14 MED ORDER — AMLODIPINE BESYLATE 10 MG PO TABS: 10.0000 mg | ORAL_TABLET | Freq: Every day | ORAL | 1 refills | Status: DC

## 2020-07-14 NOTE — Progress Notes (Signed)
Name: Patrick [name].   MRN: [id number]    DOB: [birth year]   Date:07/14/2020       Progress Note  Subjective  Chief Complaint  Follow up  HPI   HTN: Heis on Norvasc and Hydralazine TID , he sees Dr. Justin Mend - nephrologist , recently seen by Dr. Radford Pax - cardiologist and bp was still elevated therefore she increased dose of hydralazine from twice to three times daily and bp has been well controlled since.   Angina/pulmonary hypertension :no recent episodes of chest pain, no SOB, orthopnea , but he has been feeling very drained after he takes a shower, explained it may be secondary to hypotension or heart related Under the care of cardiologist   Prostate cancer: he has multiple bone scans and negative for metastasisNo longer has bone pain.He is back on Lupron last dose this Summer, going back in December. Last bone scan was June 2021 and did not show metastasis   Diastolic dysfunction stage II : may be the cause of SOB with activity, no orthopnea or lower extremity edema   1. Left ventricular ejection fraction, by visual estimation, is 60 to 65%. The left ventricle has normal function. There is moderately increased left ventricular hypertrophy. 2. Left ventricular diastolic parameters are consistent with Grade II diastolic dysfunction (pseudonormalization). 3. Elevated left atrial pressure. 4. Global right ventricle has normal systolic function.The right ventricular size is mildly enlarged. 5. Small pericardial effusion. Measures up to 0.9cm adjacent to LV lateral wall 6. Left atrial size was mildly dilated. 7. Right atrial size was normal. 8. The mitral valve is normal in structure. No evidence of mitral valve regurgitation. 9. The tricuspid valve is normal in structure. Tricuspid valve regurgitation moderate. 10. The aortic valve is tricuspid. Aortic valve regurgitation is not visualized. Mild aortic valve sclerosis without stenosis. 11. The pulmonic valve was normal in  structure. Pulmonic valve regurgitation is trivial. 12. The inferior vena cava is normal in size with greater than 50% respiratory variability, suggesting right atrial pressure of 3 mmHg. 13. The tricuspid regurgitant velocity is 3.58 m/s, and with an assumed right atrial pressure of 3 mmHg, the estimated right ventricular systolic pressure is moderately elevated at 54.3 mmHg. Left Ventricle:  CKI stageIVcontinue follow up with nephrologist. - [facility], Dr. Justin Mend , fistula placed on left upper arm back in 11/2016 and is doing well, has not started HD yet, getting bp managed by nephrologist . He has hyperparathyroidism, low vitamin D   Dyslipidemia: still taking Atorvastatin ,compliant with medication, denies side effects. Last LDL was 56. No side effectsDiscussed repeating labs today   Dementia: wife states symptoms are stable, slightly forgetful, wife is now dispensing his medications.  MMS 19 first time, refuses to recheckHe was on Aricept 10 mg, it was started on the  Fall 86, seen by Neurologist. Wife stopped giving him the medication because it was not working. He stopped working in 2019 he retired after a fall Fall 84. He is very agreeable, much quieter than his usual self.   Glaucoma open angle : he is having blurred vision left eye, had surgery and is using eye drops,he has not been driving, he has swelling, diagnosed as severe, still able to see. He is compliant with visits  PVD: swelling resolved, has some cramping, he wears compression stocking hoses , he does not walk far because he gets SOB  Malnutrition: his weight has gone down from 220 lbs average to 182 lbs for a while last visit  it was 174 lbs and today is down to 169.2 lbs He  has temporal waisting. His wife is giving him protein shakes intermittently,however he eats very little and twice daily, explained he needs to add calories and eat more frequently to try to gain weight.  Major Depression he was  taking zoloft but wife stopped giving him medication , phq 9 is positive again, but wife states he is fine, he did not take much not sure if due to dementia or depression   Patient Active Problem List   Diagnosis Date Noted  . Unstable angina pectoris ([place]) 07/14/2020  . Mild protein-calorie malnutrition ([place]) 07/14/2020  . Mild major depression ([place]) 01/10/2020  . CKD (chronic kidney disease) stage 3, GFR 30-59 ml/min (HCC) 04/05/2019  . Metabolic acidosis, normal anion gap (NAG) 07/27/2018  . Dementia without behavioral disturbance ([place]) 07/27/2018  . Glaucoma 07/27/2018  . Syncope 07/27/2018  . Secondary hyperparathyroidism ([place]) 11/26/2016  . Mild cognitive impairment 01/03/2016  . Elevated prostate specific antigen (PSA) 09/08/2015  . Bone pain 06/13/2015  . Spinal stenosis 06/13/2015  . OP (osteoporosis) 03/20/2015  . Allergic rhinitis 03/20/2015  . Lumbar canal stenosis 03/20/2015  . Vitamin D deficiency 03/20/2015  . Restless legs syndrome 03/20/2015  . Drug-induced gynecomastia 03/20/2015  . Diaphragmatic hernia 03/20/2015  . Acquired cyst of kidney 03/20/2015  . Left ventricular hypertrophy by electrocardiogram 03/20/2015  . Leg pain 03/20/2015  . History of pneumonia 03/20/2015  . Non-rheumatic tricuspid valve insufficiency 03/20/2015  . Dysmetabolic syndrome 81/82/9937  . Pulmonary hypertension ([place]) 10/31/2014  . Benign essential HTN 07/29/2014  . Dyslipidemia 07/29/2014  . PVD (peripheral vascular disease) ([place])   . Anemia of chronic disease   . IBS (irritable bowel syndrome)   . Bradycardia   . ED (erectile dysfunction) of organic origin 06/29/2012  . Genuine stress incontinence, male 06/29/2012  . Malignant neoplasm of prostate ([place]) 06/29/2012    Past Surgical History:  Procedure Laterality Date  . BASCILIC VEIN TRANSPOSITION Left 09/05/2016   Procedure: FIRST STAGE BASILIC VEIN TRANSPOSITION left arm;  Surgeon: Serafina Mitchell, MD;  Location: [facility];   Service: Vascular;  Laterality: Left;  . BASCILIC VEIN TRANSPOSITION Left 11/14/2016   Procedure: BASCILIC VEIN TRANSPOSITION-LEFT ARM 2ND STAGE;  Surgeon: Serafina Mitchell, MD;  Location: [facility];  Service: Vascular;  Laterality: Left;  . COLONOSCOPY    . EYE SURGERY Left 09/11/2017  . PROSTATE SURGERY    . PROSTATECTOMY      Family History  Problem Relation Age of Onset  . CVA Mother   . Hypertension Mother   . Dementia Mother   . Heart attack Father   . Heart disease Father        before age 67  . Hypertension Father   . Hepatitis C Sister   . Dementia Sister     Social History   Tobacco Use  . Smoking status: Former Smoker    Packs/day: 0.30    Years: 20.00    Pack years: 6.00    Types: Cigarettes    Quit date: 08/26/1978    Years since quitting: 41.9  . Smokeless tobacco: Never Used  . Tobacco comment: smoking cessation materials not required  Substance Use Topics  . Alcohol use: No    Alcohol/week: 0.0 standard drinks     Current Outpatient Medications:  .  amLODipine (NORVASC) 10 MG tablet, Take 1 tablet (10 mg total) by mouth daily., Disp: 90 tablet, Rfl: 1 .  aspirin  81 MG tablet, Take 81 mg by mouth daily. , Disp: , Rfl:  .  atorvastatin (LIPITOR) 40 MG tablet, Take 1 tablet (40 mg total) by mouth every evening., Disp: 90 tablet, Rfl: 1 .  calcitRIOL (ROCALTROL) 0.25 MCG capsule, every other day. , Disp: , Rfl:  .  donepezil (ARICEPT) 10 MG tablet, , Disp: , Rfl:  .  hydrALAZINE (APRESOLINE) 25 MG tablet, Take 1 tablet (25 mg total) by mouth 3 (three) times daily., Disp: 270 tablet, Rfl: 3 .  vitamin C (ASCORBIC ACID) 500 MG tablet, Take 500 mg by mouth daily., Disp: , Rfl:  .  Vitamin E 400 UNITS CHEW, Chew 400 Units by mouth daily. , Disp: , Rfl:   Allergies  Allergen Reactions  . Ace Inhibitors Cough    cough    I personally reviewed active problem list, medication list, allergies, family history, social history, health maintenance with the  patient/caregiver today.   ROS  Constitutional: Negative for fever , positive for  weight change.  Respiratory: Negative for cough and shortness of breath.   Cardiovascular: Negative for chest pain or palpitations.  Gastrointestinal: Negative for abdominal pain, no bowel changes.  Musculoskeletal: Negative for gait problem or joint swelling.  Skin: Negative for rash.  Neurological: Negative for dizziness or headache.  No other specific complaints in a complete review of systems (except as listed in HPI above).  Objective  Vitals:   07/14/20 1314  BP: 130/60  Pulse: 88  Resp: 16  Temp: 97.6 F (36.4 C)  TempSrc: Oral  SpO2: 99%  Weight: 169 lb 3.2 oz (76.7 kg)  Height: 6\' 2"  (1.88 m)    Body mass index is 21.72 kg/m.  Physical Exam  Constitutional: Patient appears very frail, temporal waisting  No distress.  HEENT: head atraumatic, normocephalic, pupils equal and reactive to light, neck supple Cardiovascular: Normal rate, regular rhythm and normal heart sounds.  No murmur heard. No BLE edema. Pulmonary/Chest: Effort normal and breath sounds normal. No respiratory distress. Abdominal: Soft.  There is no tenderness. Psychiatric: Patient has a normal mood and affect. behavior is normal. Judgment and thought content normal.  PHQ2/9: Depression screen [facility] 2/9 07/14/2020 04/14/2020 01/28/2020 01/10/2020 07/12/2019  Decreased Interest 0 0 0 2 0  Down, Depressed, Hopeless 0 0 0 0 0  PHQ - 2 Score 0 0 0 2 0  Altered sleeping - 0 0 0 0  Tired, decreased energy - 0 0 1 0  Change in appetite - 0 0 2 0  Feeling bad or failure about yourself  - 0 0 0 0  Trouble concentrating - 0 0 1 0  Moving slowly or fidgety/restless - 0 0 0 0  Suicidal thoughts - 0 0 0 0  PHQ-9 Score - 0 0 6 0  Difficult doing work/chores - Not difficult at all - Somewhat difficult -  Some recent data might be hidden    phq 9 is negative   Fall Risk: Fall Risk  07/14/2020 04/14/2020 01/28/2020 01/10/2020 07/12/2019   Falls in the past year? 0 0 0 0 0  Number falls in past yr: 0 0 0 0 0  Injury with Fall? 0 0 0 0 0  Risk for fall due to : - - - - -  Risk for fall due to: Comment - - - - -  Follow up - - - - -    Functional Status Survey: Is the patient deaf or have difficulty hearing?: No Does the patient  have difficulty seeing, even when wearing glasses/contacts?: Yes Does the patient have difficulty concentrating, remembering, or making decisions?: Yes Does the patient have difficulty walking or climbing stairs?: No Does the patient have difficulty dressing or bathing?: No Does the patient have difficulty doing errands alone such as visiting a doctor's office or shopping?: Yes   Assessment & Plan  1. Chronic kidney disease, stage IV (severe) (HCC)  Keep follow up with Dr. Justin Mend   2. Dyslipidemia  - atorvastatin (LIPITOR) 40 MG tablet; Take 1 tablet (40 mg total) by mouth every evening.  Dispense: 90 tablet; Refill: 1  3. Mild major depression ([place])  He seems depressed but wife does not want him to take medication   4. Benign essential HTN   5. Anemia of chronic disease   6. Dementia without behavioral disturbance, unspecified dementia type ([place])   7. Grade II diastolic dysfunction   8. Mild protein-calorie malnutrition ([place])   9. Prostate cancer [facility])  Under the care of Urologist, on Lupron again   10. Unstable angina pectoris ([place])  Advised to seat down to shower and contact Dr Radford Pax  11. Pulmonary hypertension ([place])   12. Benign hypertension with chronic kidney disease, stage III (HCC)  - amLODipine (NORVASC) 10 MG tablet; Take 1 tablet (10 mg total) by mouth daily.  Dispense: 90 tablet; Refill: 1

## 2020-08-02 DIAGNOSIS — K589 Irritable bowel syndrome without diarrhea: Secondary | ICD-10-CM | POA: Diagnosis not present

## 2020-08-02 DIAGNOSIS — N189 Chronic kidney disease, unspecified: Secondary | ICD-10-CM | POA: Diagnosis not present

## 2020-08-02 DIAGNOSIS — I739 Peripheral vascular disease, unspecified: Secondary | ICD-10-CM | POA: Diagnosis not present

## 2020-08-02 DIAGNOSIS — N2581 Secondary hyperparathyroidism of renal origin: Secondary | ICD-10-CM | POA: Diagnosis not present

## 2020-08-02 DIAGNOSIS — N184 Chronic kidney disease, stage 4 (severe): Secondary | ICD-10-CM | POA: Diagnosis not present

## 2020-08-02 DIAGNOSIS — E785 Hyperlipidemia, unspecified: Secondary | ICD-10-CM | POA: Diagnosis not present

## 2020-08-02 DIAGNOSIS — I129 Hypertensive chronic kidney disease with stage 1 through stage 4 chronic kidney disease, or unspecified chronic kidney disease: Secondary | ICD-10-CM | POA: Diagnosis not present

## 2020-08-02 DIAGNOSIS — I5030 Unspecified diastolic (congestive) heart failure: Secondary | ICD-10-CM | POA: Diagnosis not present

## 2020-08-02 DIAGNOSIS — C61 Malignant neoplasm of prostate: Secondary | ICD-10-CM | POA: Diagnosis not present

## 2020-08-02 DIAGNOSIS — D631 Anemia in chronic kidney disease: Secondary | ICD-10-CM | POA: Diagnosis not present

## 2020-08-28 ENCOUNTER — Encounter: Payer: Self-pay | Admitting: Family Medicine

## 2020-09-04 DIAGNOSIS — C61 Malignant neoplasm of prostate: Secondary | ICD-10-CM | POA: Diagnosis not present

## 2020-09-04 DIAGNOSIS — R972 Elevated prostate specific antigen [PSA]: Secondary | ICD-10-CM | POA: Diagnosis not present

## 2020-09-19 ENCOUNTER — Ambulatory Visit: Payer: Medicare HMO

## 2020-09-19 DIAGNOSIS — H401112 Primary open-angle glaucoma, right eye, moderate stage: Secondary | ICD-10-CM | POA: Diagnosis not present

## 2020-09-19 DIAGNOSIS — H401123 Primary open-angle glaucoma, left eye, severe stage: Secondary | ICD-10-CM | POA: Diagnosis not present

## 2020-09-20 ENCOUNTER — Telehealth: Payer: Self-pay | Admitting: Family Medicine

## 2020-09-20 NOTE — Telephone Encounter (Signed)
Patient called to reschedule his AWV that is scheduled for tomorrow.  He would like an earlier time.  Please advise and call patient to discuss at [phone]

## 2020-09-21 ENCOUNTER — Ambulatory Visit (INDEPENDENT_AMBULATORY_CARE_PROVIDER_SITE_OTHER): Payer: Medicare HMO

## 2020-09-21 DIAGNOSIS — Z0000 Encounter for general adult medical examination without abnormal findings: Secondary | ICD-10-CM | POA: Diagnosis not present

## 2020-09-21 NOTE — Progress Notes (Signed)
Subjective:   Patrick [name]. is a 87 y.o. male who presents for Medicare Annual/Subsequent preventive examination.  Virtual Visit via Telephone Note  I connected with  Patrick [name]. on 09/21/20 at  2:50 PM EST by telephone and verified that I am speaking with the correct person using two identifiers.  Location: Patient: home Provider: [place] Persons participating in the virtual visit: patient & wife Patrick [name] Health Advisor   I discussed the limitations, risks, security and privacy concerns of performing an evaluation and management service by telephone and the availability of in person appointments. The patient expressed understanding and agreed to proceed.  Interactive audio and video telecommunications were attempted between this nurse and patient, however failed, due to patient having technical difficulties OR patient did not have access to video capability.  We continued and completed visit with audio only.  Some vital signs may be absent or patient reported.   Clemetine Marker, LPN    Review of Systems     Cardiac Risk Factors include: advanced age (>18men, >45 women);hypertension;dyslipidemia;male gender     Objective:    There were no vitals filed for this visit. There is no height or weight on file to calculate BMI.  Advanced Directives 09/21/2020 04/13/2019 07/26/2018 04/09/2018 03/28/2017 01/13/2017 11/26/2016  Does Patient Have a Medical Advance Directive? Yes Yes No Yes Yes Yes Yes  Type of Paramedic of [place];Living will [place];Living will - [place];Living will [place];Living will [place];Living will Living will  Does patient want to make changes to medical advance directive? - No - Patient declined - - - - -  Copy of [place] in Chart? No - copy requested No - copy requested - No - copy requested - No - copy requested -  Would  patient like information on creating a medical advance directive? - - No - Patient declined - - - -    Current Medications (verified) Outpatient Encounter Medications as of 09/21/2020  Medication Sig  . amLODipine (NORVASC) 10 MG tablet Take 1 tablet (10 mg total) by mouth daily.  [name] Kitchen aspirin 81 MG tablet Take 81 mg by mouth daily.   [name] Kitchen atorvastatin (LIPITOR) 40 MG tablet Take 1 tablet (40 mg total) by mouth every evening.  . calcitRIOL (ROCALTROL) 0.25 MCG capsule every other day.   . hydrALAZINE (APRESOLINE) 25 MG tablet Take 1 tablet (25 mg total) by mouth 3 (three) times daily.  . vitamin C (ASCORBIC ACID) 500 MG tablet Take 500 mg by mouth daily.  . Vitamin E 400 UNITS CHEW Chew 400 Units by mouth daily.    No facility-administered encounter medications on file as of 09/21/2020.    Allergies (verified) Ace inhibitors   History: Past Medical History:  Diagnosis Date  . Anemia   . Arthritis   . Bradycardia   . CKD (chronic kidney disease), stage IV ([place])   . GERD (gastroesophageal reflux disease)   . High cholesterol   . Hypertension   . IBS (irritable bowel syndrome)   . Pneumonia   . Prostate cancer ([place])   . Pulmonary HTN ([place]) 11/05/2015   46mmHg on echo 2017  . PVD (peripheral vascular disease) ([place])   . Syncope 07/2018   Past Surgical History:  Procedure Laterality Date  . BASCILIC VEIN TRANSPOSITION Left 09/05/2016   Procedure: FIRST STAGE BASILIC VEIN TRANSPOSITION left arm;  Surgeon: Serafina Mitchell, MD;  Location: [facility] OR;  Service: Vascular;  Laterality: Left;  . BASCILIC VEIN TRANSPOSITION Left 11/14/2016   Procedure: BASCILIC VEIN TRANSPOSITION-LEFT ARM 2ND STAGE;  Surgeon: Serafina Mitchell, MD;  Location: [facility];  Service: Vascular;  Laterality: Left;  . COLONOSCOPY    . EYE SURGERY Left 09/11/2017  . PROSTATE SURGERY    . PROSTATECTOMY     Family History  Problem Relation Age of Onset  . CVA Mother   . Hypertension Mother   . Dementia Mother   . Heart  attack Father   . Heart disease Father        before age 12  . Hypertension Father   . Hepatitis C Sister   . Dementia Sister    Social History   Socioeconomic History  . Marital status: Married    Spouse name: Patrick Harm  . Number of children: 1  . Years of education: Not on file  . Highest education level: 6th grade  Occupational History  . Occupation: Retired   Tobacco Use  . Smoking status: Former Smoker    Packs/day: 0.30    Years: 20.00    Pack years: 6.00    Types: Cigarettes    Quit date: 08/26/1978    Years since quitting: 42.1  . Smokeless tobacco: Never Used  . Tobacco comment: smoking cessation materials not required  Vaping Use  . Vaping Use: Never used  Substance and Sexual Activity  . Alcohol use: No    Alcohol/week: 0.0 standard drinks  . Drug use: No  . Sexual activity: Not Currently  Other Topics Concern  . Not on file  Social History Narrative   Lives w/ wife   Caffeine use: none   Right handed    He retired 07/2018 because of medical problems   Social Determinants of Health   Financial Resource Strain: Low Risk   . Difficulty of Paying Living Expenses: Not hard at all  Food Insecurity: No Food Insecurity  . Worried About Charity fundraiser in the Last Year: Never true  . Ran Out of Food in the Last Year: Never true  Transportation Needs: No Transportation Needs  . Lack of Transportation (Medical): No  . Lack of Transportation (Non-Medical): No  Physical Activity: Inactive  . Days of Exercise per Week: 0 days  . Minutes of Exercise per Session: 0 min  Stress: No Stress Concern Present  . Feeling of Stress : Not at all  Social Connections: Moderately Integrated  . Frequency of Communication with Friends and Family: More than three times a week  . Frequency of Social Gatherings with Friends and Family: Never  . Attends Religious Services: More than 4 times per year  . Active Member of Clubs or Organizations: No  . Attends Theatre manager Meetings: Never  . Marital Status: Married    Tobacco Counseling Counseling given: Not Answered Comment: smoking cessation materials not required   Clinical Intake:  Pre-visit preparation completed: Yes  Pain : No/denies pain     Nutritional Risks: None Diabetes: No  How often do you need to have someone help you when you read instructions, pamphlets, or other written materials from your doctor or pharmacy?: 1 - Never    Interpreter Needed?: No  Information entered by :: Clemetine Marker LPN   Activities of Daily Living In your present state of health, do you have any difficulty performing the following activities: 09/21/2020 07/14/2020  Hearing? N N  Comment declines hearing aids -  Vision?  Y Y  Difficulty concentrating or making decisions? Y Y  Comment - -  Walking or climbing stairs? N N  Dressing or bathing? N N  Doing errands, shopping? N Y  Conservation officer, nature and eating ? N -  Using the Toilet? N -  In the past six months, have you accidently leaked urine? N -  Do you have problems with loss of bowel control? N -  Managing your Medications? Y -  Managing your Finances? Y -  Housekeeping or managing your Housekeeping? N -  Some recent data might be hidden    Patient Care Team: Steele Sizer, MD as PCP - General (Family Medicine) Edrick Oh, MD as Consulting Physician (Nephrology) Myrlene Broker, MD as Attending Physician (Urology) Sueanne Margarita, MD as Consulting Physician (Cardiology) [facility] (Ophthalmology)  Indicate any recent Medical Services you may have received from other than [facility] providers in the past year (date may be approximate).     Assessment:   This is a routine wellness examination for [facility].  Hearing/Vision screen  Hearing Screening   125Hz  250Hz  500Hz  1000Hz  2000Hz  3000Hz  4000Hz  6000Hz  8000Hz   Right ear:           Left ear:           Comments: Pt denies hearing difficulty  Vision Screening  Comments: Vision screenings done by Dr. Alanda Slim at [place] issues and exercise activities discussed: Current Exercise Habits: The patient does not participate in regular exercise at present, Exercise limited by: None identified  Goals    . DIET - INCREASE WATER INTAKE     Recommend to drink at least 6-8 8oz glasses of water per day.      Depression Screen PHQ 2/9 Scores 09/21/2020 07/14/2020 04/14/2020 01/28/2020 01/10/2020 07/12/2019 04/13/2019  PHQ - 2 Score 0 0 0 0 2 0 0  PHQ- 9 Score - - 0 0 6 0 -    Fall Risk Fall Risk  09/21/2020 07/14/2020 04/14/2020 01/28/2020 01/10/2020  Falls in the past year? 0 0 0 0 0  Number falls in past yr: 0 0 0 0 0  Injury with Fall? 0 0 0 0 0  Risk for fall due to : Impaired vision - - - -  Risk for fall due to: Comment - - - - -  Follow up Falls prevention discussed - - - -    FALL RISK PREVENTION PERTAINING TO THE HOME:  Any stairs in or around the home? Yes  If so, are there any without handrails? No  Home free of loose throw rugs in walkways, pet beds, electrical cords, etc? Yes  Adequate lighting in your home to reduce risk of falls? Yes   ASSISTIVE DEVICES UTILIZED TO PREVENT FALLS:  Life alert? No  Use of a cane, walker or w/c? No  Grab bars in the bathroom? Yes  Shower chair or bench in shower? Yes  Elevated toilet seat or a handicapped toilet? Yes   TIMED UP AND GO:  Was the test performed? No . Telephonic visi  Cognitive Function: MMSE - Mini Mental State Exam 01/01/2018 07/07/2017  Orientation to time 1 0  Orientation to Place 4 5  Registration 3 0  Attention/ Calculation 0 0  Recall 0 0  Language- name 2 objects 2 2  Language- repeat 1 1  Language- follow 3 step command 3 3  Language- read & follow direction 1 1  Write a sentence 1 1  Copy design  1 0  Total score 17 13     6CIT Screen 09/21/2020 04/13/2019 04/09/2018  What Year? 0 points 0 points 4 points  What month? 3 points 0 points 3 points  What time? 0 points 0  points 3 points  Count back from 20 0 points 0 points 0 points  Months in reverse 4 points 4 points 0 points  Repeat phrase 10 points 8 points 10 points  Total Score 17 12 20     Immunizations Immunization History  Administered Date(s) Administered  . Fluad Quad(high Dose 65+) 06/09/2019, 06/12/2020  . Influenza, High Dose Seasonal PF 06/13/2015, 08/26/2016, 07/07/2017, 06/08/2018, 06/09/2019, 06/12/2020  . PFIZER SARS-COV-2 Vaccination 10/16/2019, 11/06/2019, 05/27/2020  . Pneumococcal Conjugate-13 03/23/2015  . Pneumococcal Polysaccharide-23 08/26/2016    TDAP status: Due, Education has been provided regarding the importance of this vaccine. Advised may receive this vaccine at local pharmacy or Health Dept. Aware to provide a copy of the vaccination record if obtained from local pharmacy or Health Dept. Verbalized acceptance and understanding.  Flu Vaccine status: Up to date  Pneumococcal vaccine status: Up to date  Covid-19 vaccine status: Completed vaccines  Qualifies for Shingles Vaccine? Yes   Zostavax completed No   Shingrix Completed?: No.    Education has been provided regarding the importance of this vaccine. Patient has been advised to call insurance company to determine out of pocket expense if they have not yet received this vaccine. Advised may also receive vaccine at local pharmacy or Health Dept. Verbalized acceptance and understanding.  Screening Tests Health Maintenance  Topic Date Due  . TETANUS/TDAP  01/09/2021 (Originally 10/09/1950)  . COVID-19 Vaccine (4 - Booster for Pfizer series) 11/24/2020  . INFLUENZA VACCINE  Completed  . PNA vac Low Risk Adult  Completed    Health Maintenance  There are no preventive care reminders to display for this patient.  Colorectal cancer screening: No longer required.   Lung Cancer Screening: (Low Dose CT Chest recommended if Age 54-80 years, 30 pack-year currently smoking OR have quit w/in 15years.) does not qualify.    Additional Screening:  Hepatitis C Screening: does not qualify  Vision Screening: Recommended annual ophthalmology exams for early detection of glaucoma and other disorders of the eye. Is the patient up to date with their annual eye exam?  Yes  Who is the provider or what is the name of the office in which the patient attends annual eye exams? Dr. [facility]  Dental Screening: Recommended annual dental exams for proper oral hygiene  Community Resource Referral / Chronic Care Management: CRR required this visit?  No   CCM required this visit?  No      Plan:     I have personally reviewed and noted the following in the patient's chart:   . Medical and social history . Use of alcohol, tobacco or illicit drugs  . Current medications and supplements . Functional ability and status . Nutritional status . Physical activity . Advanced directives . List of other physicians . Hospitalizations, surgeries, and ER visits in previous 12 months . Vitals . Screenings to include cognitive, depression, and falls . Referrals and appointments  In addition, I have reviewed and discussed with patient certain preventive protocols, quality metrics, and best practice recommendations. A written personalized care plan for preventive services as well as general preventive health recommendations were provided to patient.     Clemetine Marker, LPN   [id number]   Nurse Notes: none

## 2020-09-21 NOTE — Patient Instructions (Signed)
Patrick [name] , Thank you for taking time to come for your Medicare Wellness Visit. I appreciate your ongoing commitment to your health goals. Please review the following plan we discussed and let me know if I can assist you in the future.   Screening recommendations/referrals: Colonoscopy: no longer required  Recommended yearly ophthalmology/optometry visit for glaucoma screening and checkup Recommended yearly dental visit for hygiene and checkup  Vaccinations: Influenza vaccine: done 06/12/20 Pneumococcal vaccine: done 08/26/16 Tdap vaccine: due Shingles vaccine: Shingrix discussed. Please contact your pharmacy for coverage information.  Covid-19: done 10/16/19, 11/06/19 & 05/27/20  Advanced directives: Please bring a copy of your health care power of attorney and living will to the office at your convenience.  Conditions/risks identified: Recommend increasing physical activity   Next appointment: Follow up in one year for your annual wellness visit.   Preventive Care 2 Years and Older, Male Preventive care refers to lifestyle choices and visits with your health care provider that can promote health and wellness. What does preventive care include?  A yearly physical exam. This is also called an annual well check.  Dental exams once or twice a year.  Routine eye exams. Ask your health care provider how often you should have your eyes checked.  Personal lifestyle choices, including:  Daily care of your teeth and gums.  Regular physical activity.  Eating a healthy diet.  Avoiding tobacco and drug use.  Limiting alcohol use.  Practicing safe sex.  Taking low doses of aspirin every day.  Taking vitamin and mineral supplements as recommended by your health care provider. What happens during an annual well check? The services and screenings done by your health care provider during your annual well check will depend on your age, overall health, lifestyle risk factors, and family  history of disease. Counseling  Your health care provider may ask you questions about your:  Alcohol use.  Tobacco use.  Drug use.  Emotional well-being.  Home and relationship well-being.  Sexual activity.  Eating habits.  History of falls.  Memory and ability to understand (cognition).  Work and work Statistician. Screening  You may have the following tests or measurements:  Height, weight, and BMI.  Blood pressure.  Lipid and cholesterol levels. These may be checked every 5 years, or more frequently if you are over 35 years old.  Skin check.  Lung cancer screening. You may have this screening every year starting at age 33 if you have a 30-pack-year history of smoking and currently smoke or have quit within the past 15 years.  Fecal occult blood test (FOBT) of the stool. You may have this test every year starting at age 92.  Flexible sigmoidoscopy or colonoscopy. You may have a sigmoidoscopy every 5 years or a colonoscopy every 10 years starting at age 48.  Prostate cancer screening. Recommendations will vary depending on your family history and other risks.  Hepatitis C blood test.  Hepatitis B blood test.  Sexually transmitted disease (STD) testing.  Diabetes screening. This is done by checking your blood sugar (glucose) after you have not eaten for a while (fasting). You may have this done every 1-3 years.  Abdominal aortic aneurysm (AAA) screening. You may need this if you are a current or former smoker.  Osteoporosis. You may be screened starting at age 49 if you are at high risk. Talk with your health care provider about your test results, treatment options, and if necessary, the need for more tests. Vaccines  Your health care  provider may recommend certain vaccines, such as:  Influenza vaccine. This is recommended every year.  Tetanus, diphtheria, and acellular pertussis (Tdap, Td) vaccine. You may need a Td booster every 10 years.  Zoster vaccine.  You may need this after age 77.  Pneumococcal 13-valent conjugate (PCV13) vaccine. One dose is recommended after age 46.  Pneumococcal polysaccharide (PPSV23) vaccine. One dose is recommended after age 21. Talk to your health care provider about which screenings and vaccines you need and how often you need them. This information is not intended to replace advice given to you by your health care provider. Make sure you discuss any questions you have with your health care provider. Document Released: 09/22/2015 Document Revised: 05/15/2016 Document Reviewed: 06/27/2015 Elsevier Interactive Patient Education  2017 [place] Prevention in the Home Falls can cause injuries. They can happen to people of all ages. There are many things you can do to make your home safe and to help prevent falls. What can I do on the outside of my home?  Regularly fix the edges of walkways and driveways and fix any cracks.  Remove anything that might make you trip as you walk through a door, such as a raised step or threshold.  Trim any bushes or trees on the path to your home.  Use bright outdoor lighting.  Clear any walking paths of anything that might make someone trip, such as rocks or tools.  Regularly check to see if handrails are loose or broken. Make sure that both sides of any steps have handrails.  Any raised decks and porches should have guardrails on the edges.  Have any leaves, snow, or ice cleared regularly.  Use sand or salt on walking paths during winter.  Clean up any spills in your garage right away. This includes oil or grease spills. What can I do in the bathroom?  Use night lights.  Install grab bars by the toilet and in the tub and shower. Do not use towel bars as grab bars.  Use non-skid mats or decals in the tub or shower.  If you need to sit down in the shower, use a plastic, non-slip stool.  Keep the floor dry. Clean up any water that spills on the floor as soon  as it happens.  Remove soap buildup in the tub or shower regularly.  Attach bath mats securely with double-sided non-slip rug tape.  Do not have throw rugs and other things on the floor that can make you trip. What can I do in the bedroom?  Use night lights.  Make sure that you have a light by your bed that is easy to reach.  Do not use any sheets or blankets that are too big for your bed. They should not hang down onto the floor.  Have a firm chair that has side arms. You can use this for support while you get dressed.  Do not have throw rugs and other things on the floor that can make you trip. What can I do in the kitchen?  Clean up any spills right away.  Avoid walking on wet floors.  Keep items that you use a lot in easy-to-reach places.  If you need to reach something above you, use a strong step stool that has a grab bar.  Keep electrical cords out of the way.  Do not use floor polish or wax that makes floors slippery. If you must use wax, use non-skid floor wax.  Do not have throw  rugs and other things on the floor that can make you trip. What can I do with my stairs?  Do not leave any items on the stairs.  Make sure that there are handrails on both sides of the stairs and use them. Fix handrails that are broken or loose. Make sure that handrails are as long as the stairways.  Check any carpeting to make sure that it is firmly attached to the stairs. Fix any carpet that is loose or worn.  Avoid having throw rugs at the top or bottom of the stairs. If you do have throw rugs, attach them to the floor with carpet tape.  Make sure that you have a light switch at the top of the stairs and the bottom of the stairs. If you do not have them, ask someone to add them for you. What else can I do to help prevent falls?  Wear shoes that:  Do not have high heels.  Have rubber bottoms.  Are comfortable and fit you well.  Are closed at the toe. Do not wear sandals.  If  you use a stepladder:  Make sure that it is fully opened. Do not climb a closed stepladder.  Make sure that both sides of the stepladder are locked into place.  Ask someone to hold it for you, if possible.  Clearly mark and make sure that you can see:  Any grab bars or handrails.  First and last steps.  Where the edge of each step is.  Use tools that help you move around (mobility aids) if they are needed. These include:  Canes.  Walkers.  Scooters.  Crutches.  Turn on the lights when you go into a dark area. Replace any light bulbs as soon as they burn out.  Set up your furniture so you have a clear path. Avoid moving your furniture around.  If any of your floors are uneven, fix them.  If there are any pets around you, be aware of where they are.  Review your medicines with your doctor. Some medicines can make you feel dizzy. This can increase your chance of falling. Ask your doctor what other things that you can do to help prevent falls. This information is not intended to replace advice given to you by your health care provider. Make sure you discuss any questions you have with your health care provider. Document Released: 06/22/2009 Document Revised: 02/01/2016 Document Reviewed: 09/30/2014 Elsevier Interactive Patient Education  2017 [employer].

## 2020-10-30 DIAGNOSIS — I129 Hypertensive chronic kidney disease with stage 1 through stage 4 chronic kidney disease, or unspecified chronic kidney disease: Secondary | ICD-10-CM | POA: Diagnosis not present

## 2020-10-30 DIAGNOSIS — D631 Anemia in chronic kidney disease: Secondary | ICD-10-CM | POA: Diagnosis not present

## 2020-10-30 DIAGNOSIS — C61 Malignant neoplasm of prostate: Secondary | ICD-10-CM | POA: Diagnosis not present

## 2020-10-30 DIAGNOSIS — E785 Hyperlipidemia, unspecified: Secondary | ICD-10-CM | POA: Diagnosis not present

## 2020-10-30 DIAGNOSIS — N2581 Secondary hyperparathyroidism of renal origin: Secondary | ICD-10-CM | POA: Diagnosis not present

## 2020-10-30 DIAGNOSIS — N184 Chronic kidney disease, stage 4 (severe): Secondary | ICD-10-CM | POA: Diagnosis not present

## 2020-10-30 DIAGNOSIS — I739 Peripheral vascular disease, unspecified: Secondary | ICD-10-CM | POA: Diagnosis not present

## 2020-10-30 DIAGNOSIS — K589 Irritable bowel syndrome without diarrhea: Secondary | ICD-10-CM | POA: Diagnosis not present

## 2020-10-30 DIAGNOSIS — N189 Chronic kidney disease, unspecified: Secondary | ICD-10-CM | POA: Diagnosis not present

## 2020-10-30 DIAGNOSIS — I5030 Unspecified diastolic (congestive) heart failure: Secondary | ICD-10-CM | POA: Diagnosis not present

## 2020-11-02 ENCOUNTER — Encounter: Payer: Self-pay | Admitting: Family Medicine

## 2020-11-09 NOTE — Progress Notes (Addendum)
Name: Patrick [name].   MRN: [id number]    DOB: [birth year]   Date:11/13/2020       Progress Note  Subjective  Chief Complaint  Follow Up  HPI  HTN: Heis on Norvasc and Hydralazine TID , he sees Dr. Justin Mend - nephrologist , recently seen by Dr. Radford Pax - cardiologist bp today is at goal.   Gait instability: wife is very concerned because he has been grabbing the walls when walking , also seems to be off balance at times, he is willing to have PT evaluate him at home. He does not drive, his wife transports him   Angina/pulmonary hypertension :no recent episodes of chest pain,  he has to use two pillows due to orthopnea , he still has sob with mild activity like taking a shower  Under the care of cardiologist   Prostate cancer: he has multiple bone scans and negative for metastasisNo longer has bone pain.He is back on Lupron and last PSA improved.   Diastolic dysfunction stage II : may be the cause of SOB with activity, he has orthopnea but denies lower extremity edema   1. Left ventricular ejection fraction, by visual estimation, is 60 to 65%. The left ventricle has normal function. There is moderately increased left ventricular hypertrophy. 2. Left ventricular diastolic parameters are consistent with Grade II diastolic dysfunction (pseudonormalization). 3. Elevated left atrial pressure. 4. Global right ventricle has normal systolic function.The right ventricular size is mildly enlarged. 5. Small pericardial effusion. Measures up to 0.9cm adjacent to LV lateral wall 6. Left atrial size was mildly dilated. 7. Right atrial size was normal. 8. The mitral valve is normal in structure. No evidence of mitral valve regurgitation. 9. The tricuspid valve is normal in structure. Tricuspid valve regurgitation moderate. 10. The aortic valve is tricuspid. Aortic valve regurgitation is not visualized. Mild aortic valve sclerosis without stenosis. 11. The pulmonic valve was normal in  structure. Pulmonic valve regurgitation is trivial. 12. The inferior vena cava is normal in size with greater than 50% respiratory variability, suggesting right atrial pressure of 3 mmHg. 13. The tricuspid regurgitant velocity is 3.58 m/s, and with an assumed right atrial pressure of 3 mmHg, the estimated right ventricular systolic pressure is moderately elevated at 54.3 mmHg. Left Ventricle:  CKI stageIVcontinue follow up with nephrologist. - [facility], Dr. Justin Mend , fistula placed on left upper arm back in 11/2016 and is doing well, has not started HD yet, getting bp managed by nephrologist . He has secondary hyperparathyroidism, low vitamin D . Dr. Justin Mend did not check his vitamin D on his last labs back in Nov and we will check it today. He states he has good urine output and denies pruritis   Dyslipidemia: still taking Atorvastatin ,compliant with medication, denies side effects. Last LDL was 56. No side effectswe will recheck labs   Dementia: wife states symptoms are stable, slightly forgetful, wife is now dispensing his medications.  MMS 19 first time, refuses to recheckHe was on Aricept 10 mg, it was started on the  Fall 2018, seen by Neurologist. Wife stopped giving him the medication because it was not working. He stopped working in 2019 he retired after a fall Fall 2019. He is not quiet today, wife said he is forgetful like taking the rash 3 days before the day it was supposed to be placed outside, he was defensive   Glaucoma open angle : he is having blurred vision left eye, had surgery and is using eye drops,he  has not been driving, he has swelling, diagnosed as severe, still able to see, he is compliant with eye appointments   PVD: swelling resolved, has some cramping, he wears compression stocking hoses , he does not walk far because he gets SOB, unchanged   Malnutrition: his weight has gone down from 220 lbs average to 182 lbs for a while lbut now is more in the 170  lbs range His wife is giving him protein shakes intermittently,however he eats very little and twice daily, explained he needs to add calories and eat more frequently to try to gain weight. We will continue to monitor   Major Depression he was taking zoloft but wife stopped giving him medication , wife did most of the talking and he seemed annoyed at times. He likes sitting outside in the sun, wife is concerned because of medications, discussed compromising.   Patient Active Problem List   Diagnosis Date Noted  . Unstable angina pectoris ([place]) 07/14/2020  . Mild protein-calorie malnutrition ([place]) 07/14/2020  . Mild major depression ([place]) 01/10/2020  . CKD (chronic kidney disease) stage 3, GFR 30-59 ml/min (HCC) 04/05/2019  . Metabolic acidosis, normal anion gap (NAG) 07/27/2018  . Dementia without behavioral disturbance ([place]) 07/27/2018  . Glaucoma 07/27/2018  . Syncope 07/27/2018  . Secondary hyperparathyroidism ([place]) 11/26/2016  . Mild cognitive impairment 01/03/2016  . Elevated prostate specific antigen (PSA) 09/08/2015  . Bone pain 06/13/2015  . Spinal stenosis 06/13/2015  . OP (osteoporosis) 03/20/2015  . Allergic rhinitis 03/20/2015  . Lumbar canal stenosis 03/20/2015  . Vitamin D deficiency 03/20/2015  . Restless legs syndrome 03/20/2015  . Drug-induced gynecomastia 03/20/2015  . Diaphragmatic hernia 03/20/2015  . Acquired cyst of kidney 03/20/2015  . Left ventricular hypertrophy by electrocardiogram 03/20/2015  . Leg pain 03/20/2015  . History of pneumonia 03/20/2015  . Non-rheumatic tricuspid valve insufficiency 03/20/2015  . Dysmetabolic syndrome 58/85/0277  . Pulmonary hypertension ([place]) 10/31/2014  . Benign essential HTN 07/29/2014  . Dyslipidemia 07/29/2014  . PVD (peripheral vascular disease) ([place])   . Anemia of chronic disease   . IBS (irritable bowel syndrome)   . Bradycardia   . ED (erectile dysfunction) of organic origin 06/29/2012  . Genuine stress  incontinence, male 06/29/2012  . Malignant neoplasm of prostate ([place]) 06/29/2012    Past Surgical History:  Procedure Laterality Date  . BASCILIC VEIN TRANSPOSITION Left 09/05/2016   Procedure: FIRST STAGE BASILIC VEIN TRANSPOSITION left arm;  Surgeon: Serafina Mitchell, MD;  Location: [facility];  Service: Vascular;  Laterality: Left;  . BASCILIC VEIN TRANSPOSITION Left 11/14/2016   Procedure: BASCILIC VEIN TRANSPOSITION-LEFT ARM 2ND STAGE;  Surgeon: Serafina Mitchell, MD;  Location: [facility];  Service: Vascular;  Laterality: Left;  . COLONOSCOPY    . EYE SURGERY Left 09/11/2017  . PROSTATE SURGERY    . PROSTATECTOMY      Family History  Problem Relation Age of Onset  . CVA Mother   . Hypertension Mother   . Dementia Mother   . Heart attack Father   . Heart disease Father        before age 42  . Hypertension Father   . Hepatitis C Sister   . Dementia Sister     Social History   Tobacco Use  . Smoking status: Former Smoker    Packs/day: 0.30    Years: 20.00    Pack years: 6.00    Types: Cigarettes    Quit date: 08/26/1978    Years since quitting:  42.2  . Smokeless tobacco: Never Used  . Tobacco comment: smoking cessation materials not required  Substance Use Topics  . Alcohol use: No    Alcohol/week: 0.0 standard drinks     Current Outpatient Medications:  .  amLODipine (NORVASC) 10 MG tablet, Take 1 tablet (10 mg total) by mouth daily., Disp: 90 tablet, Rfl: 1 .  aspirin 81 MG tablet, Take 81 mg by mouth daily. , Disp: , Rfl:  .  atorvastatin (LIPITOR) 40 MG tablet, Take 1 tablet (40 mg total) by mouth every evening., Disp: 90 tablet, Rfl: 1 .  calcitRIOL (ROCALTROL) 0.25 MCG capsule, every other day. , Disp: , Rfl:  .  hydrALAZINE (APRESOLINE) 25 MG tablet, Take 1 tablet (25 mg total) by mouth 3 (three) times daily., Disp: 270 tablet, Rfl: 3 .  vitamin C (ASCORBIC ACID) 500 MG tablet, Take 500 mg by mouth daily., Disp: , Rfl:  .  Vitamin E 400 UNITS CHEW, Chew 400 Units by  mouth daily. , Disp: , Rfl:   Allergies  Allergen Reactions  . Ace Inhibitors Cough    cough    I personally reviewed active problem list, medication list, allergies, family history, social history, health maintenance with the patient/caregiver today.   ROS  Constitutional: Negative for fever or weight change.  Respiratory: Negative for cough . Positive for shortness of breath.   Cardiovascular: Negative for chest pain or palpitations.  Gastrointestinal: Negative for abdominal pain, no bowel changes.  Musculoskeletal:positive for gait problem but no joint swelling.  Skin: Negative for rash.  Neurological: Negative for dizziness or headache.  No other specific complaints in a complete review of systems (except as listed in HPI above).  Objective  Vitals:   11/13/20 1321  BP: 132/68  Pulse: 80  Resp: 16  Temp: 98.4 F (36.9 C)  TempSrc: Oral  SpO2: 97%  Weight: 172 lb (78 kg)  Height: 6\' 2"  (1.88 m)    Body mass index is 22.08 kg/m.  Physical Exam  Constitutional: Patient appears well-developed and malnourished   No distress.  HEENT: head atraumatic, normocephalic, he has a sac over left upper eye ball, protruding  neck supple Cardiovascular: Normal rate, regular rhythm and normal heart sounds.  2/6 SEM No BLE edema. Pulmonary/Chest: Effort normal and breath sounds normal. No respiratory distress. Abdominal: Soft.  There is no tenderness. Psychiatric: Patient has a normal mood and affect. behavior is normal. Judgment and thought content normal.  PHQ2/9: Depression screen [facility] 2/9 11/13/2020 09/21/2020 07/14/2020 04/14/2020 01/28/2020  Decreased Interest 3 0 0 0 0  Down, Depressed, Hopeless 0 0 0 0 0  PHQ - 2 Score 3 0 0 0 0  Altered sleeping 0 - - 0 0  Tired, decreased energy 3 - - 0 0  Change in appetite 0 - - 0 0  Feeling bad or failure about yourself  0 - - 0 0  Trouble concentrating 0 - - 0 0  Moving slowly or fidgety/restless 0 - - 0 0  Suicidal thoughts 0 - - 0  0  PHQ-9 Score 6 - - 0 0  Difficult doing work/chores - - - Not difficult at all -  Some recent data might be hidden    phq 9 is positive   Fall Risk: Fall Risk  11/13/2020 09/21/2020 07/14/2020 04/14/2020 01/28/2020  Falls in the past year? 0 0 0 0 0  Number falls in past yr: 0 0 0 0 0  Injury with Fall? 0 0 0  0 0  Risk for fall due to : - Impaired vision - - -  Risk for fall due to: Comment - - - - -  Follow up - Falls prevention discussed - - -     Functional Status Survey: Is the patient deaf or have difficulty hearing?: No Does the patient have difficulty seeing, even when wearing glasses/contacts?: Yes Does the patient have difficulty concentrating, remembering, or making decisions?: Yes Does the patient have difficulty walking or climbing stairs?: No Does the patient have difficulty dressing or bathing?: No Does the patient have difficulty doing errands alone such as visiting a doctor's office or shopping?: No    Assessment & Plan  1. Chronic kidney disease, stage IV (severe) (HCC)  - COMPLETE METABOLIC PANEL WITH GFR - CBC with Differential/Platelet  2. Benign essential HTN   3. Anemia, unspecified type  - Iron, TIBC and Ferritin Panel  4. Dyslipidemia  - Lipid panel  5. Mild major depression ([place])   6. Dementia without behavioral disturbance, unspecified dementia type ([place])  Discussed resuming medication   7. Prostate cancer ([place])   8. Grade II diastolic dysfunction   9. Mild protein-calorie malnutrition ([place])  Continue protein shake   10. PVD (peripheral vascular disease) ([place])   11. Secondary hyperparathyroidism of renal origin ([place])  Checked by nephrologist   12. Benign hypertension with chronic kidney disease, stage IV ([place])   13. Primary open angle glaucoma of both eyes, unspecified glaucoma stage  Keep follow up with ophthalmologist   14. Vitamin D deficiency  - VITAMIN D 25 Hydroxy (Vit-D Deficiency, Fractures)  15. Pulmonary  hypertension ([place])   16. Unstable angina pectoris ([place])  Under the care of cardiologist   17. Gait instability  - Ambulatory referral to [facility]

## 2020-11-13 ENCOUNTER — Other Ambulatory Visit: Payer: Self-pay

## 2020-11-13 ENCOUNTER — Ambulatory Visit (INDEPENDENT_AMBULATORY_CARE_PROVIDER_SITE_OTHER): Payer: Medicare HMO | Admitting: Family Medicine

## 2020-11-13 ENCOUNTER — Encounter: Payer: Self-pay | Admitting: Family Medicine

## 2020-11-13 VITALS — BP 132/68 | HR 80 | Temp 98.4°F | Resp 16 | Ht 74.0 in | Wt 172.0 lb

## 2020-11-13 DIAGNOSIS — D649 Anemia, unspecified: Secondary | ICD-10-CM

## 2020-11-13 DIAGNOSIS — N2581 Secondary hyperparathyroidism of renal origin: Secondary | ICD-10-CM

## 2020-11-13 DIAGNOSIS — E785 Hyperlipidemia, unspecified: Secondary | ICD-10-CM | POA: Diagnosis not present

## 2020-11-13 DIAGNOSIS — I10 Essential (primary) hypertension: Secondary | ICD-10-CM | POA: Diagnosis not present

## 2020-11-13 DIAGNOSIS — F0390 Unspecified dementia without behavioral disturbance: Secondary | ICD-10-CM

## 2020-11-13 DIAGNOSIS — C61 Malignant neoplasm of prostate: Secondary | ICD-10-CM | POA: Diagnosis not present

## 2020-11-13 DIAGNOSIS — E441 Mild protein-calorie malnutrition: Secondary | ICD-10-CM

## 2020-11-13 DIAGNOSIS — I5189 Other ill-defined heart diseases: Secondary | ICD-10-CM

## 2020-11-13 DIAGNOSIS — R2681 Unsteadiness on feet: Secondary | ICD-10-CM

## 2020-11-13 DIAGNOSIS — I739 Peripheral vascular disease, unspecified: Secondary | ICD-10-CM

## 2020-11-13 DIAGNOSIS — I200 Unstable angina: Secondary | ICD-10-CM

## 2020-11-13 DIAGNOSIS — E559 Vitamin D deficiency, unspecified: Secondary | ICD-10-CM | POA: Diagnosis not present

## 2020-11-13 DIAGNOSIS — I129 Hypertensive chronic kidney disease with stage 1 through stage 4 chronic kidney disease, or unspecified chronic kidney disease: Secondary | ICD-10-CM

## 2020-11-13 DIAGNOSIS — N184 Chronic kidney disease, stage 4 (severe): Secondary | ICD-10-CM

## 2020-11-13 DIAGNOSIS — F320 Major depressive disorder, single episode, mild: Secondary | ICD-10-CM | POA: Diagnosis not present

## 2020-11-13 DIAGNOSIS — H401130 Primary open-angle glaucoma, bilateral, stage unspecified: Secondary | ICD-10-CM

## 2020-11-13 DIAGNOSIS — I272 Pulmonary hypertension, unspecified: Secondary | ICD-10-CM

## 2020-11-14 LAB — COMPLETE METABOLIC PANEL WITH GFR
AG Ratio: 1.3 (calc) (ref 1.0–2.5)
ALT: 13 U/L (ref 9–46)
AST: 27 U/L (ref 10–35)
Albumin: 4.3 g/dL (ref 3.6–5.1)
Alkaline phosphatase (APISO): 65 U/L (ref 35–144)
BUN/Creatinine Ratio: 12 (calc) (ref 6–22)
BUN: 49 mg/dL — ABNORMAL HIGH (ref 7–25)
CO2: 25 mmol/L (ref 20–32)
Calcium: 10.1 mg/dL (ref 8.6–10.3)
Chloride: 108 mmol/L (ref 98–110)
Creat: 3.96 mg/dL — ABNORMAL HIGH (ref 0.70–1.11)
GFR, Est African American: 15 mL/min/{1.73_m2} — ABNORMAL LOW (ref >=60)
GFR, Est Non African American: 13 mL/min/{1.73_m2} — ABNORMAL LOW (ref >=60)
Globulin: 3.4 g/dL (calc) (ref 1.9–3.7)
Glucose, Bld: 108 mg/dL — ABNORMAL HIGH (ref 65–99)
Potassium: 5.3 mmol/L (ref 3.5–5.3)
Sodium: 140 mmol/L (ref 135–146)
Total Bilirubin: 0.6 mg/dL (ref 0.2–1.2)
Total Protein: 7.7 g/dL (ref 6.1–8.1)

## 2020-11-14 LAB — CBC WITH DIFFERENTIAL/PLATELET
Absolute Monocytes: 394 cells/uL (ref 200–950)
Basophils Absolute: 20 cells/uL (ref 0–200)
Basophils Relative: 0.6 %
Eosinophils Absolute: 82 cells/uL (ref 15–500)
Eosinophils Relative: 2.4 %
HCT: 28.8 % — ABNORMAL LOW (ref 38.5–50.0)
Hemoglobin: 9.7 g/dL — ABNORMAL LOW (ref 13.2–17.1)
Lymphs Abs: 1295 cells/uL (ref 850–3900)
MCH: 31.7 pg (ref 27.0–33.0)
MCHC: 33.7 g/dL (ref 32.0–36.0)
MCV: 94.1 fL (ref 80.0–100.0)
MPV: 11.3 fL (ref 7.5–12.5)
Monocytes Relative: 11.6 %
Neutro Abs: 1608 cells/uL (ref 1500–7800)
Neutrophils Relative %: 47.3 %
Platelets: 156 10*3/uL (ref 140–400)
RBC: 3.06 10*6/uL — ABNORMAL LOW (ref 4.20–5.80)
RDW: 12.3 % (ref 11.0–15.0)
Total Lymphocyte: 38.1 %
WBC: 3.4 10*3/uL — ABNORMAL LOW (ref 3.8–10.8)

## 2020-11-14 LAB — IRON,TIBC AND FERRITIN PANEL
%SAT: 34 % (calc) (ref 20–48)
Ferritin: 128 ng/mL (ref 24–380)
Iron: 76 ug/dL (ref 50–180)
TIBC: 223 mcg/dL (calc) — ABNORMAL LOW (ref 250–425)

## 2020-11-14 LAB — VITAMIN D 25 HYDROXY (VIT D DEFICIENCY, FRACTURES): Vit D, 25-Hydroxy: 21 ng/mL — ABNORMAL LOW (ref 30–100)

## 2020-11-14 LAB — LIPID PANEL
Cholesterol: 154 mg/dL (ref <200)
HDL: 62 mg/dL (ref >=40)
LDL Cholesterol (Calc): 74 mg/dL (calc)
Non-HDL Cholesterol (Calc): 92 mg/dL (calc) (ref <130)
Total CHOL/HDL Ratio: 2.5 (calc) (ref <5.0)
Triglycerides: 92 mg/dL (ref <150)

## 2020-11-16 DIAGNOSIS — I129 Hypertensive chronic kidney disease with stage 1 through stage 4 chronic kidney disease, or unspecified chronic kidney disease: Secondary | ICD-10-CM | POA: Diagnosis not present

## 2020-11-16 DIAGNOSIS — M810 Age-related osteoporosis without current pathological fracture: Secondary | ICD-10-CM | POA: Diagnosis not present

## 2020-11-16 DIAGNOSIS — F0390 Unspecified dementia without behavioral disturbance: Secondary | ICD-10-CM | POA: Diagnosis not present

## 2020-11-16 DIAGNOSIS — D631 Anemia in chronic kidney disease: Secondary | ICD-10-CM | POA: Diagnosis not present

## 2020-11-16 DIAGNOSIS — H401130 Primary open-angle glaucoma, bilateral, stage unspecified: Secondary | ICD-10-CM | POA: Diagnosis not present

## 2020-11-16 DIAGNOSIS — I739 Peripheral vascular disease, unspecified: Secondary | ICD-10-CM | POA: Diagnosis not present

## 2020-11-16 DIAGNOSIS — N184 Chronic kidney disease, stage 4 (severe): Secondary | ICD-10-CM | POA: Diagnosis not present

## 2020-11-16 DIAGNOSIS — C61 Malignant neoplasm of prostate: Secondary | ICD-10-CM | POA: Diagnosis not present

## 2020-11-16 DIAGNOSIS — M48061 Spinal stenosis, lumbar region without neurogenic claudication: Secondary | ICD-10-CM | POA: Diagnosis not present

## 2020-11-24 DIAGNOSIS — C61 Malignant neoplasm of prostate: Secondary | ICD-10-CM | POA: Diagnosis not present

## 2020-11-24 DIAGNOSIS — N184 Chronic kidney disease, stage 4 (severe): Secondary | ICD-10-CM | POA: Diagnosis not present

## 2020-11-24 DIAGNOSIS — F0390 Unspecified dementia without behavioral disturbance: Secondary | ICD-10-CM | POA: Diagnosis not present

## 2020-11-24 DIAGNOSIS — I129 Hypertensive chronic kidney disease with stage 1 through stage 4 chronic kidney disease, or unspecified chronic kidney disease: Secondary | ICD-10-CM | POA: Diagnosis not present

## 2020-11-24 DIAGNOSIS — I739 Peripheral vascular disease, unspecified: Secondary | ICD-10-CM | POA: Diagnosis not present

## 2020-11-24 DIAGNOSIS — M810 Age-related osteoporosis without current pathological fracture: Secondary | ICD-10-CM | POA: Diagnosis not present

## 2020-11-24 DIAGNOSIS — H401130 Primary open-angle glaucoma, bilateral, stage unspecified: Secondary | ICD-10-CM | POA: Diagnosis not present

## 2020-11-24 DIAGNOSIS — M48061 Spinal stenosis, lumbar region without neurogenic claudication: Secondary | ICD-10-CM | POA: Diagnosis not present

## 2020-11-24 DIAGNOSIS — D631 Anemia in chronic kidney disease: Secondary | ICD-10-CM | POA: Diagnosis not present

## 2020-11-30 DIAGNOSIS — M48061 Spinal stenosis, lumbar region without neurogenic claudication: Secondary | ICD-10-CM | POA: Diagnosis not present

## 2020-11-30 DIAGNOSIS — N184 Chronic kidney disease, stage 4 (severe): Secondary | ICD-10-CM | POA: Diagnosis not present

## 2020-11-30 DIAGNOSIS — I129 Hypertensive chronic kidney disease with stage 1 through stage 4 chronic kidney disease, or unspecified chronic kidney disease: Secondary | ICD-10-CM | POA: Diagnosis not present

## 2020-11-30 DIAGNOSIS — M810 Age-related osteoporosis without current pathological fracture: Secondary | ICD-10-CM | POA: Diagnosis not present

## 2020-11-30 DIAGNOSIS — D631 Anemia in chronic kidney disease: Secondary | ICD-10-CM | POA: Diagnosis not present

## 2020-11-30 DIAGNOSIS — F0390 Unspecified dementia without behavioral disturbance: Secondary | ICD-10-CM | POA: Diagnosis not present

## 2020-11-30 DIAGNOSIS — C61 Malignant neoplasm of prostate: Secondary | ICD-10-CM | POA: Diagnosis not present

## 2020-11-30 DIAGNOSIS — I739 Peripheral vascular disease, unspecified: Secondary | ICD-10-CM | POA: Diagnosis not present

## 2020-11-30 DIAGNOSIS — H401130 Primary open-angle glaucoma, bilateral, stage unspecified: Secondary | ICD-10-CM | POA: Diagnosis not present

## 2020-12-04 DIAGNOSIS — I129 Hypertensive chronic kidney disease with stage 1 through stage 4 chronic kidney disease, or unspecified chronic kidney disease: Secondary | ICD-10-CM | POA: Diagnosis not present

## 2020-12-04 DIAGNOSIS — C61 Malignant neoplasm of prostate: Secondary | ICD-10-CM | POA: Diagnosis not present

## 2020-12-04 DIAGNOSIS — M48061 Spinal stenosis, lumbar region without neurogenic claudication: Secondary | ICD-10-CM | POA: Diagnosis not present

## 2020-12-04 DIAGNOSIS — F0390 Unspecified dementia without behavioral disturbance: Secondary | ICD-10-CM | POA: Diagnosis not present

## 2020-12-04 DIAGNOSIS — D631 Anemia in chronic kidney disease: Secondary | ICD-10-CM | POA: Diagnosis not present

## 2020-12-04 DIAGNOSIS — I739 Peripheral vascular disease, unspecified: Secondary | ICD-10-CM | POA: Diagnosis not present

## 2020-12-04 DIAGNOSIS — N184 Chronic kidney disease, stage 4 (severe): Secondary | ICD-10-CM | POA: Diagnosis not present

## 2020-12-04 DIAGNOSIS — H401130 Primary open-angle glaucoma, bilateral, stage unspecified: Secondary | ICD-10-CM | POA: Diagnosis not present

## 2020-12-04 DIAGNOSIS — M810 Age-related osteoporosis without current pathological fracture: Secondary | ICD-10-CM | POA: Diagnosis not present

## 2020-12-08 DIAGNOSIS — N184 Chronic kidney disease, stage 4 (severe): Secondary | ICD-10-CM | POA: Diagnosis not present

## 2020-12-08 DIAGNOSIS — C61 Malignant neoplasm of prostate: Secondary | ICD-10-CM | POA: Diagnosis not present

## 2020-12-08 DIAGNOSIS — M48061 Spinal stenosis, lumbar region without neurogenic claudication: Secondary | ICD-10-CM | POA: Diagnosis not present

## 2020-12-08 DIAGNOSIS — M810 Age-related osteoporosis without current pathological fracture: Secondary | ICD-10-CM | POA: Diagnosis not present

## 2020-12-08 DIAGNOSIS — I739 Peripheral vascular disease, unspecified: Secondary | ICD-10-CM | POA: Diagnosis not present

## 2020-12-08 DIAGNOSIS — H401130 Primary open-angle glaucoma, bilateral, stage unspecified: Secondary | ICD-10-CM | POA: Diagnosis not present

## 2020-12-08 DIAGNOSIS — D631 Anemia in chronic kidney disease: Secondary | ICD-10-CM | POA: Diagnosis not present

## 2020-12-08 DIAGNOSIS — I129 Hypertensive chronic kidney disease with stage 1 through stage 4 chronic kidney disease, or unspecified chronic kidney disease: Secondary | ICD-10-CM | POA: Diagnosis not present

## 2020-12-08 DIAGNOSIS — F0390 Unspecified dementia without behavioral disturbance: Secondary | ICD-10-CM | POA: Diagnosis not present

## 2020-12-12 DIAGNOSIS — H401130 Primary open-angle glaucoma, bilateral, stage unspecified: Secondary | ICD-10-CM | POA: Diagnosis not present

## 2020-12-12 DIAGNOSIS — D631 Anemia in chronic kidney disease: Secondary | ICD-10-CM | POA: Diagnosis not present

## 2020-12-12 DIAGNOSIS — M810 Age-related osteoporosis without current pathological fracture: Secondary | ICD-10-CM | POA: Diagnosis not present

## 2020-12-12 DIAGNOSIS — I129 Hypertensive chronic kidney disease with stage 1 through stage 4 chronic kidney disease, or unspecified chronic kidney disease: Secondary | ICD-10-CM | POA: Diagnosis not present

## 2020-12-12 DIAGNOSIS — N184 Chronic kidney disease, stage 4 (severe): Secondary | ICD-10-CM | POA: Diagnosis not present

## 2020-12-12 DIAGNOSIS — F0390 Unspecified dementia without behavioral disturbance: Secondary | ICD-10-CM | POA: Diagnosis not present

## 2020-12-12 DIAGNOSIS — C61 Malignant neoplasm of prostate: Secondary | ICD-10-CM | POA: Diagnosis not present

## 2020-12-12 DIAGNOSIS — I739 Peripheral vascular disease, unspecified: Secondary | ICD-10-CM | POA: Diagnosis not present

## 2020-12-12 DIAGNOSIS — M48061 Spinal stenosis, lumbar region without neurogenic claudication: Secondary | ICD-10-CM | POA: Diagnosis not present

## 2020-12-14 ENCOUNTER — Other Ambulatory Visit: Payer: Self-pay | Admitting: *Deleted

## 2020-12-14 ENCOUNTER — Other Ambulatory Visit: Payer: Self-pay | Admitting: Family Medicine

## 2020-12-14 DIAGNOSIS — D631 Anemia in chronic kidney disease: Secondary | ICD-10-CM | POA: Diagnosis not present

## 2020-12-14 DIAGNOSIS — E785 Hyperlipidemia, unspecified: Secondary | ICD-10-CM

## 2020-12-14 DIAGNOSIS — N183 Chronic kidney disease, stage 3 unspecified: Secondary | ICD-10-CM

## 2020-12-14 DIAGNOSIS — H401130 Primary open-angle glaucoma, bilateral, stage unspecified: Secondary | ICD-10-CM | POA: Diagnosis not present

## 2020-12-14 DIAGNOSIS — C61 Malignant neoplasm of prostate: Secondary | ICD-10-CM | POA: Diagnosis not present

## 2020-12-14 DIAGNOSIS — I739 Peripheral vascular disease, unspecified: Secondary | ICD-10-CM | POA: Diagnosis not present

## 2020-12-14 DIAGNOSIS — M810 Age-related osteoporosis without current pathological fracture: Secondary | ICD-10-CM | POA: Diagnosis not present

## 2020-12-14 DIAGNOSIS — F0390 Unspecified dementia without behavioral disturbance: Secondary | ICD-10-CM | POA: Diagnosis not present

## 2020-12-14 DIAGNOSIS — N184 Chronic kidney disease, stage 4 (severe): Secondary | ICD-10-CM | POA: Diagnosis not present

## 2020-12-14 DIAGNOSIS — M48061 Spinal stenosis, lumbar region without neurogenic claudication: Secondary | ICD-10-CM | POA: Diagnosis not present

## 2020-12-14 DIAGNOSIS — I129 Hypertensive chronic kidney disease with stage 1 through stage 4 chronic kidney disease, or unspecified chronic kidney disease: Secondary | ICD-10-CM | POA: Diagnosis not present

## 2020-12-14 MED ORDER — HYDRALAZINE HCL 25 MG PO TABS: 25.0000 mg | ORAL_TABLET | Freq: Three times a day (TID) | ORAL | 1 refills | Status: DC

## 2020-12-14 MED ORDER — AMLODIPINE BESYLATE 10 MG PO TABS: 10.0000 mg | ORAL_TABLET | Freq: Every day | ORAL | 1 refills | Status: DC

## 2020-12-14 MED ORDER — ATORVASTATIN CALCIUM 40 MG PO TABS: 40.0000 mg | ORAL_TABLET | Freq: Every evening | ORAL | 1 refills | Status: DC

## 2020-12-14 NOTE — Telephone Encounter (Signed)
Medication Refill - Medication: atorvastatin (LIPITOR) 40 MG tablet , amLODipine (NORVASC) 10 MG tablet     Preferred Pharmacy (with phone number or street name):   [place], [place] Phone:  [phone]  Fax:  [phone]       Agent: Please be advised that RX refills may take up to 3 business days. We ask that you follow-up with your pharmacy.

## 2020-12-18 DIAGNOSIS — M48061 Spinal stenosis, lumbar region without neurogenic claudication: Secondary | ICD-10-CM | POA: Diagnosis not present

## 2020-12-18 DIAGNOSIS — H401130 Primary open-angle glaucoma, bilateral, stage unspecified: Secondary | ICD-10-CM | POA: Diagnosis not present

## 2020-12-18 DIAGNOSIS — M810 Age-related osteoporosis without current pathological fracture: Secondary | ICD-10-CM | POA: Diagnosis not present

## 2020-12-18 DIAGNOSIS — D631 Anemia in chronic kidney disease: Secondary | ICD-10-CM | POA: Diagnosis not present

## 2020-12-18 DIAGNOSIS — C61 Malignant neoplasm of prostate: Secondary | ICD-10-CM | POA: Diagnosis not present

## 2020-12-18 DIAGNOSIS — I129 Hypertensive chronic kidney disease with stage 1 through stage 4 chronic kidney disease, or unspecified chronic kidney disease: Secondary | ICD-10-CM | POA: Diagnosis not present

## 2020-12-18 DIAGNOSIS — F0390 Unspecified dementia without behavioral disturbance: Secondary | ICD-10-CM | POA: Diagnosis not present

## 2020-12-18 DIAGNOSIS — N184 Chronic kidney disease, stage 4 (severe): Secondary | ICD-10-CM | POA: Diagnosis not present

## 2020-12-18 DIAGNOSIS — I739 Peripheral vascular disease, unspecified: Secondary | ICD-10-CM | POA: Diagnosis not present

## 2020-12-29 ENCOUNTER — Ambulatory Visit
Admission: RE | Admit: 2020-12-29 | Discharge: 2020-12-29 | Disposition: A | Discharge status: Home / Self Care | Payer: Medicare HMO | Attending: Family Medicine | Admitting: Family Medicine

## 2020-12-29 ENCOUNTER — Ambulatory Visit (INDEPENDENT_AMBULATORY_CARE_PROVIDER_SITE_OTHER): Payer: Medicare HMO | Admitting: Family Medicine

## 2020-12-29 ENCOUNTER — Other Ambulatory Visit: Payer: Self-pay

## 2020-12-29 ENCOUNTER — Ambulatory Visit
Admission: RE | Admit: 2020-12-29 | Discharge: 2020-12-29 | Disposition: A | Discharge status: Home / Self Care | Payer: Medicare HMO | Source: Ambulatory Visit | Attending: Family Medicine | Admitting: Family Medicine

## 2020-12-29 ENCOUNTER — Encounter: Payer: Self-pay | Admitting: Family Medicine

## 2020-12-29 VITALS — BP 138/86 | HR 86 | Temp 98.6°F | Resp 16 | Ht 74.0 in | Wt 173.0 lb

## 2020-12-29 DIAGNOSIS — M7989 Other specified soft tissue disorders: Secondary | ICD-10-CM | POA: Diagnosis not present

## 2020-12-29 DIAGNOSIS — L608 Other nail disorders: Secondary | ICD-10-CM

## 2020-12-29 DIAGNOSIS — M25572 Pain in left ankle and joints of left foot: Secondary | ICD-10-CM | POA: Diagnosis not present

## 2020-12-29 DIAGNOSIS — M19072 Primary osteoarthritis, left ankle and foot: Secondary | ICD-10-CM | POA: Diagnosis not present

## 2020-12-29 NOTE — Progress Notes (Signed)
4/22/20222:03 PM  Patrick [name]. 01-Feb-1932, 87 y.o., male [id number]  Chief Complaint  Patient presents with  . Foot Swelling    Left, x2 days    HPI:   Patient is a 87 y.o. male with past medical history significant for HTN, HLD who presents today for foot swelling.  Left foot swelling started 2 days ago Is worried it is strained Wife had ordered a foot peddler It wasn't working and he was attempting to use it He had swelling and pain and his foot at that time Since then it has resolved. He does not see podiatry Reports baseline numbness to his toes on that foot Has not needed to take anything for pain   Depression screen [facility] 2/9 12/29/2020 11/13/2020 09/21/2020  Decreased Interest 0 3 0  Down, Depressed, Hopeless 0 0 0  PHQ - 2 Score 0 3 0  Altered sleeping - 0 -  Tired, decreased energy - 3 -  Change in appetite - 0 -  Feeling bad or failure about yourself  - 0 -  Trouble concentrating - 0 -  Moving slowly or fidgety/restless - 0 -  Suicidal thoughts - 0 -  PHQ-9 Score - 6 -  Difficult doing work/chores - - -  Some recent data might be hidden    Fall Risk  12/29/2020 11/13/2020 09/21/2020 07/14/2020 04/14/2020  Falls in the past year? 0 0 0 0 0  Number falls in past yr: 0 0 0 0 0  Injury with Fall? 0 0 0 0 0  Risk for fall due to : - - Impaired vision - -  Risk for fall due to: Comment - - - - -  Follow up - - Falls prevention discussed - -     Allergies  Allergen Reactions  . Ace Inhibitors Cough    cough    Prior to Admission medications   Medication Sig Start Date End Date Taking? Authorizing Provider  amLODipine (NORVASC) 10 MG tablet Take 1 tablet (10 mg total) by mouth daily. 12/14/20  Yes Steele Sizer, MD  aspirin 81 MG tablet Take 81 mg by mouth daily.  10/05/09  Yes [provider]  atorvastatin (LIPITOR) 40 MG tablet Take 1 tablet (40 mg total) by mouth every evening. 12/14/20  Yes Sowles, Drue Stager, MD  calcitRIOL (ROCALTROL) 0.25 MCG  capsule every other day.  07/23/19  Yes [provider]  hydrALAZINE (APRESOLINE) 25 MG tablet Take 1 tablet (25 mg total) by mouth 3 (three) times daily. 12/14/20  Yes Turner, Eber Hong, MD  vitamin C (ASCORBIC ACID) 500 MG tablet Take 500 mg by mouth daily.   Yes [provider]  Vitamin E 400 UNITS CHEW Chew 400 Units by mouth daily.    Yes [provider]    Past Medical History:  Diagnosis Date  . Anemia   . Arthritis   . Bradycardia   . CKD (chronic kidney disease), stage IV ([place])   . GERD (gastroesophageal reflux disease)   . High cholesterol   . Hypertension   . IBS (irritable bowel syndrome)   . Pneumonia   . Prostate cancer ([place])   . Pulmonary HTN ([place]) 11/05/2015   54mmHg on echo 2017  . PVD (peripheral vascular disease) ([place])   . Syncope 07/2018    Past Surgical History:  Procedure Laterality Date  . BASCILIC VEIN TRANSPOSITION Left 09/05/2016   Procedure: FIRST STAGE BASILIC VEIN TRANSPOSITION left arm;  Surgeon: Serafina Mitchell, MD;  Location[facility];  Service: Vascular;  Laterality: Left;  . BASCILIC VEIN TRANSPOSITION Left 11/14/2016   Procedure: BASCILIC VEIN TRANSPOSITION-LEFT ARM 2ND STAGE;  Surgeon: Serafina Mitchell, MD;  Location: [facility];  Service: Vascular;  Laterality: Left;  . COLONOSCOPY    . EYE SURGERY Left 09/11/2017  . PROSTATE SURGERY    . PROSTATECTOMY      Social History   Tobacco Use  . Smoking status: Former Smoker    Packs/day: 0.30    Years: 20.00    Pack years: 6.00    Types: Cigarettes    Quit date: 08/26/1978    Years since quitting: 42.3  . Smokeless tobacco: Never Used  . Tobacco comment: smoking cessation materials not required  Substance Use Topics  . Alcohol use: No    Alcohol/week: 0.0 standard drinks    Family History  Problem Relation Age of Onset  . CVA Mother   . Hypertension Mother   . Dementia Mother   . Heart attack Father   . Heart disease Father        before age 66  . Hypertension  Father   . Hepatitis C Sister   . Dementia Sister     Review of Systems  Respiratory: Negative.   Cardiovascular: Negative.   Genitourinary: Negative.   Musculoskeletal: Positive for joint pain (Previous left ankle pain). Negative for falls.  Neurological: Positive for tingling (Left toes at baseline).     OBJECTIVE:  Today's Vitals   12/29/20 1326  BP: 138/86  Pulse: 86  Resp: 16  Temp: 98.6 F (37 C)  TempSrc: Oral  SpO2: 96%  Weight: 173 lb (78.5 kg)  Height: 6\' 2"  (1.88 m)   Body mass index is 22.21 kg/m.   Physical Exam Vitals reviewed.  Constitutional:      Appearance: Normal appearance.  HENT:     Head: Normocephalic and atraumatic.  Eyes:     Conjunctiva/sclera: Conjunctivae normal.     Pupils: Pupils are equal, round, and reactive to light.  Cardiovascular:     Rate and Rhythm: Normal rate and regular rhythm.     Pulses: Normal pulses.     Heart sounds: Normal heart sounds. No murmur heard. No friction rub. No gallop.   Pulmonary:     Effort: Pulmonary effort is normal. No respiratory distress.     Breath sounds: Normal breath sounds. No stridor. No wheezing or rales.  Abdominal:     General: Bowel sounds are normal.     Palpations: Abdomen is soft.     Tenderness: There is no abdominal tenderness.  Musculoskeletal:     Right lower leg: No edema.     Left lower leg: No edema.     Right ankle: Normal.     Left ankle: Normal.     Right foot: Normal.     Left foot: Normal range of motion and normal capillary refill. No swelling, deformity, tenderness or bony tenderness. Normal pulse.  Feet:     Left foot:     Skin integrity: Skin integrity normal.     Toenail Condition: Left toenails are abnormally thick.  Skin:    General: Skin is warm and dry.  Neurological:     General: No focal deficit present.     Mental Status: He is alert and oriented to person, place, and time.     Gait: Gait abnormal (Baseline shuffeling gait).  Psychiatric:         Mood and Affect: Mood normal.  Behavior: Behavior normal.     No results found for this or any previous visit (from the past 24 hour(s)).  No results found.   ASSESSMENT and PLAN  Problem List Items Addressed This Visit   None   Visit Diagnoses    Acute left ankle pain    -  Primary   Relevant Orders   DG Foot Complete Left   Toenail deformity       Relevant Orders   Ambulatory referral to Podiatry       Plan . RTC/ED precautions discussed . Will follow up with imaging   Return if symptoms worsen or fail to improve.    Huston Foley Ishmel Acevedo, FNP-BC [facility]

## 2020-12-29 NOTE — Patient Instructions (Signed)
Ankle Sprain  An ankle sprain is a stretch or tear in one of the tough tissues (ligaments) that connect the bones in your ankle. An ankle sprain can happen when the ankle rolls outward (inversion sprain) or inward (eversion sprain). What are the causes? This condition is caused by rolling or twisting the ankle. What increases the risk? You are more likely to develop this condition if you play sports. What are the signs or symptoms? Symptoms of this condition include:  Pain in your ankle.  Swelling.  Bruising. This may happen right after you sprain your ankle or 1-2 days later.  Trouble standing or walking. How is this diagnosed? This condition is diagnosed with:  A physical exam. During the exam, your doctor will press on certain parts of your foot and ankle and try to move them in certain ways.  X-ray imaging. These may be taken to see how bad the sprain is and to check for broken bones. How is this treated? This condition may be treated with:  A brace or splint. This is used to keep the ankle from moving until it heals.  An elastic bandage. This is used to support the ankle.  Crutches.  Pain medicine.  Surgery. This may be needed if the sprain is very bad.  Physical therapy. This may help to improve movement in the ankle. Follow these instructions at home: If you have a brace or a splint:  Wear the brace or splint as told by your doctor. Remove it only as told by your doctor.  Loosen the brace or splint if your toes: ? Tingle. ? Lose feeling (become numb). ? Turn cold and blue.  Keep the brace or splint clean.  If the brace or splint is not waterproof: ? Do not let it get wet. ? Cover it with a watertight covering when you take a bath or a shower. If you have an elastic bandage (dressing):  Remove it to shower or bathe.  Try not to move your ankle much, but wiggle your toes from time to time. This helps to prevent swelling.  Adjust the dressing if it feels  too tight.  Loosen the dressing if your foot: ? Loses feeling. ? Tingles. ? Becomes cold and blue. Managing pain, stiffness, and swelling  Take over-the-counter and prescription medicines only as told by doctor.  For 2-3 days, keep your ankle raised (elevated) above the level of your heart.  If told, put ice on the injured area: ? If you have a removable brace or splint, remove it as told by your doctor. ? Put ice in a plastic bag. ? Place a towel between your skin and the bag. ? Leave the ice on for 20 minutes, 2-3 times a day.   General instructions  Rest your ankle.  Do not use your injured leg to support your body weight until your doctor says that you can. Use crutches as told by your doctor.  Do not use any products that contain nicotine or tobacco, such as cigarettes, e-cigarettes, and chewing tobacco. If you need help quitting, ask your doctor.  Keep all follow-up visits as told by your doctor. Contact a doctor if:  Your bruises or swelling are quickly getting worse.  Your pain does not get better after you take medicine. Get help right away if:  You cannot feel your toes or foot.  Your foot or toes look blue.  You have very bad pain that gets worse. Summary  An ankle sprain is a  stretch or tear in one of the tough tissues (ligaments) that connect the bones in your ankle.  This condition is caused by rolling or twisting the ankle.  Symptoms include pain, swelling, bruising, and trouble walking.  To help with pain and swelling, put ice on the injured ankle, raise your ankle above the level of your heart, and use an elastic bandage. Also, rest as told by your doctor.  Keep all follow-up visits as told by your doctor. This is important. This information is not intended to replace advice given to you by your health care provider. Make sure you discuss any questions you have with your health care provider. Document Revised: 01/20/2018 Document Reviewed:  01/20/2018 Elsevier Patient Education  2021 [employer]vier Inc.  

## 2021-01-01 ENCOUNTER — Telehealth: Payer: Self-pay | Admitting: Family Medicine

## 2021-01-01 DIAGNOSIS — H401130 Primary open-angle glaucoma, bilateral, stage unspecified: Secondary | ICD-10-CM | POA: Diagnosis not present

## 2021-01-01 DIAGNOSIS — M48061 Spinal stenosis, lumbar region without neurogenic claudication: Secondary | ICD-10-CM | POA: Diagnosis not present

## 2021-01-01 DIAGNOSIS — N184 Chronic kidney disease, stage 4 (severe): Secondary | ICD-10-CM | POA: Diagnosis not present

## 2021-01-01 DIAGNOSIS — C61 Malignant neoplasm of prostate: Secondary | ICD-10-CM | POA: Diagnosis not present

## 2021-01-01 DIAGNOSIS — F0390 Unspecified dementia without behavioral disturbance: Secondary | ICD-10-CM | POA: Diagnosis not present

## 2021-01-01 DIAGNOSIS — M25572 Pain in left ankle and joints of left foot: Secondary | ICD-10-CM

## 2021-01-01 DIAGNOSIS — I739 Peripheral vascular disease, unspecified: Secondary | ICD-10-CM | POA: Diagnosis not present

## 2021-01-01 DIAGNOSIS — M810 Age-related osteoporosis without current pathological fracture: Secondary | ICD-10-CM | POA: Diagnosis not present

## 2021-01-01 DIAGNOSIS — D631 Anemia in chronic kidney disease: Secondary | ICD-10-CM | POA: Diagnosis not present

## 2021-01-01 DIAGNOSIS — I129 Hypertensive chronic kidney disease with stage 1 through stage 4 chronic kidney disease, or unspecified chronic kidney disease: Secondary | ICD-10-CM | POA: Diagnosis not present

## 2021-01-01 NOTE — Telephone Encounter (Signed)
Pt's wife Altha Harm is calling to see if the xray is back for the pt's Left ankle pain.  Left foot numbness and pain. Please advise CB- [phone]

## 2021-01-01 NOTE — Telephone Encounter (Signed)
Patient has an appointment at [facility] on Friday at 9:00 for ingrown toenail. Should we put in a referral and have them look at it then as well?

## 2021-01-02 NOTE — Progress Notes (Signed)
No signs of acute injury, Arthritis and bony spurs noted. This can be addressed at podiatry.

## 2021-01-05 ENCOUNTER — Other Ambulatory Visit: Payer: Self-pay

## 2021-01-05 ENCOUNTER — Ambulatory Visit (INDEPENDENT_AMBULATORY_CARE_PROVIDER_SITE_OTHER): Payer: Medicare HMO | Admitting: Podiatry

## 2021-01-05 ENCOUNTER — Ambulatory Visit (INDEPENDENT_AMBULATORY_CARE_PROVIDER_SITE_OTHER): Payer: Medicare HMO

## 2021-01-05 DIAGNOSIS — L600 Ingrowing nail: Secondary | ICD-10-CM

## 2021-01-05 DIAGNOSIS — M25572 Pain in left ankle and joints of left foot: Secondary | ICD-10-CM | POA: Diagnosis not present

## 2021-01-05 DIAGNOSIS — M7752 Other enthesopathy of left foot: Secondary | ICD-10-CM

## 2021-01-05 MED ORDER — TRIAMCINOLONE ACETONIDE 10 MG/ML IJ SUSP
10.0000 mg | Freq: Once | INTRAMUSCULAR | Status: AC
  Administered 2021-01-05: 10 mg

## 2021-01-05 NOTE — Progress Notes (Signed)
Subjective:   Patient ID: [name]., male   DOB: 87 y.o.   MRN: [id number]   HPI Patient presents with caregiver with severely thickened painful left hallux nail that he cannot take care of and its hard to wear shoe gear with and impossible for them to cut along with pain in the left ankle of several months duration.  Does not remember specific injury and states its been bothering him quite a bit.  Patient does not smoke likes to be active if possible   Review of Systems  All other systems reviewed and are negative.       Objective:  Physical Exam Vitals and nursing note reviewed.  Constitutional:      Appearance: He is well-developed.  Pulmonary:     Effort: Pulmonary effort is normal.  Musculoskeletal:        General: Normal range of motion.  Skin:    General: Skin is warm.  Neurological:     Mental Status: He is alert.     Neurovascular status intact muscle strength was found to be moderately reduced as was range of motion with exquisite discomfort left sinus tarsi with inflammation and severely thickened dystrophic hallux nail left painful when pressed making shoe gear difficult.  Patient is found to have good digital perfusion well oriented x3     Assessment:  Severe damage with pain left hallux nail along with inflammatory capsulitis subtalar joint left     Plan:  H&P x-ray reviewed conditions discussed nail removal recommended I allowed him and caregiver to read consent form signed understanding risk.  I infiltrated the left hallux 60 mg like Marcaine mixture sterile prep done and using sterile instrumentation remove the hallux nail exposed matrix applied phenol 5 applications 30 seconds followed by alcohol lavage sterile dressing and gave instructions for soaks.  For the ankle did sterile prep injected the sinus tarsi capsule 3 mg Kenalog 5 mg Xylocaine and advised on stable shoe gear.  Reappoint to recheck  X-rays indicate that there is arthritis of the ankle  subtalar joint moderate in nature osteoporosis no indication of fracture

## 2021-01-05 NOTE — Patient Instructions (Signed)

## 2021-01-08 ENCOUNTER — Telehealth: Payer: Self-pay

## 2021-01-08 NOTE — Telephone Encounter (Signed)
Copied from [place] [id number]. Topic: General - Other >> Jan 08, 2021  3:54 PM Patrick [name] wrote: Reason for CRM: Patient wife Patrick [name] called in to inform Dr Ancil Boozer that his memory is getting worst and need something called in to the pharmacy to help him. Would like [name] call back please when done Ph# [phone]

## 2021-01-09 ENCOUNTER — Other Ambulatory Visit: Payer: Self-pay | Admitting: Podiatry

## 2021-01-09 DIAGNOSIS — M7752 Other enthesopathy of left foot: Secondary | ICD-10-CM

## 2021-01-15 NOTE — Progress Notes (Signed)
Name: Patrick [name].   MRN: [id number]    DOB: 09-24-1931   Date:01/16/2021       Progress Note  Subjective  Chief Complaint  Memory Worsening/ Medication Change  HPI  Dementia: he used to take Aricept in the past but patient and wife stopped because did not seem to improve symptoms. However he has been getting gradually worse, per wife, however he does not think he is losing his memory. His MSS is down to 13 today.  Wife states he cannot recall anything about one hour later.   He cannot recall time, date, names. He recalls things from the past He does not wonder or has hallucinations. Denies headaches  Patient Active Problem List   Diagnosis Date Noted  . Unstable angina pectoris ([place]) 07/14/2020  . Mild protein-calorie malnutrition ([place]) 07/14/2020  . Mild major depression ([place]) 01/10/2020  . CKD (chronic kidney disease) stage 3, GFR 30-59 ml/min (HCC) 04/05/2019  . Metabolic acidosis, normal anion gap (NAG) 07/27/2018  . Dementia without behavioral disturbance ([place]) 07/27/2018  . Glaucoma 07/27/2018  . Syncope 07/27/2018  . Secondary hyperparathyroidism ([place]) 11/26/2016  . Mild cognitive impairment 01/03/2016  . Elevated prostate specific antigen (PSA) 09/08/2015  . Bone pain 06/13/2015  . Spinal stenosis 06/13/2015  . OP (osteoporosis) 03/20/2015  . Allergic rhinitis 03/20/2015  . Lumbar canal stenosis 03/20/2015  . Vitamin D deficiency 03/20/2015  . Restless legs syndrome 03/20/2015  . Drug-induced gynecomastia 03/20/2015  . Diaphragmatic hernia 03/20/2015  . Acquired cyst of kidney 03/20/2015  . Left ventricular hypertrophy by electrocardiogram 03/20/2015  . Leg pain 03/20/2015  . History of pneumonia 03/20/2015  . Non-rheumatic tricuspid valve insufficiency 03/20/2015  . Dysmetabolic syndrome 32/95/1884  . Pulmonary hypertension ([place]) 10/31/2014  . Benign essential HTN 07/29/2014  . Dyslipidemia 07/29/2014  . PVD (peripheral vascular disease) ([place])   .  Anemia of chronic disease   . IBS (irritable bowel syndrome)   . Bradycardia   . ED (erectile dysfunction) of organic origin 06/29/2012  . Genuine stress incontinence, male 06/29/2012  . Malignant neoplasm of prostate ([place]) 06/29/2012    Past Surgical History:  Procedure Laterality Date  . BASCILIC VEIN TRANSPOSITION Left 09/05/2016   Procedure: FIRST STAGE BASILIC VEIN TRANSPOSITION left arm;  Surgeon: Serafina Mitchell, MD;  Location: [facility];  Service: Vascular;  Laterality: Left;  . BASCILIC VEIN TRANSPOSITION Left 11/14/2016   Procedure: BASCILIC VEIN TRANSPOSITION-LEFT ARM 2ND STAGE;  Surgeon: Serafina Mitchell, MD;  Location: [facility];  Service: Vascular;  Laterality: Left;  . COLONOSCOPY    . EYE SURGERY Left 09/11/2017  . PROSTATE SURGERY    . PROSTATECTOMY      Family History  Problem Relation Age of Onset  . CVA Mother   . Hypertension Mother   . Dementia Mother   . Heart attack Father   . Heart disease Father        before age 51  . Hypertension Father   . Hepatitis C Sister   . Dementia Sister     Social History   Tobacco Use  . Smoking status: Former Smoker    Packs/day: 0.30    Years: 20.00    Pack years: 6.00    Types: Cigarettes    Quit date: 08/26/1978    Years since quitting: 42.4  . Smokeless tobacco: Never Used  . Tobacco comment: smoking cessation materials not required  Substance Use Topics  . Alcohol use: No    Alcohol/week: 0.0 standard  drinks     Current Outpatient Medications:  .  amLODipine (NORVASC) 10 MG tablet, Take 1 tablet (10 mg total) by mouth daily., Disp: 90 tablet, Rfl: 1 .  aspirin 81 MG tablet, Take 81 mg by mouth daily. , Disp: , Rfl:  .  atorvastatin (LIPITOR) 40 MG tablet, Take 1 tablet (40 mg total) by mouth every evening., Disp: 90 tablet, Rfl: 1 .  calcitRIOL (ROCALTROL) 0.25 MCG capsule, every other day. , Disp: , Rfl:  .  hydrALAZINE (APRESOLINE) 25 MG tablet, Take 1 tablet (25 mg total) by mouth 3 (three) times daily.,  Disp: 270 tablet, Rfl: 1 .  vitamin C (ASCORBIC ACID) 500 MG tablet, Take 500 mg by mouth daily., Disp: , Rfl:  .  Vitamin E 400 UNITS CHEW, Chew 400 Units by mouth daily. , Disp: , Rfl:   Allergies  Allergen Reactions  . Ace Inhibitors Cough    cough    I personally reviewed active problem list, medication list, allergies, family history, social history, health maintenance with the patient/caregiver today.   ROS  Ten systems reviewed and is negative except as mentioned in HPI  Recent toe nail removal  Objective  Vitals:   01/16/21 1542  BP: 132/74  Pulse: 75  Resp: 16  Temp: 98.2 F (36.8 C)  TempSrc: Oral  SpO2: 98%  Weight: 172 lb 12.8 oz (78.4 kg)  Height: 6\' 2"  (1.88 m)    Body mass index is 22.19 kg/m.  Physical Exam  Constitutional: Patient appears well-developed and well-nourished.  No distress.  HEENT: head atraumatic, normocephalic, pupils equal and reactive to light,  neck supple Cardiovascular: Normal rate, regular rhythm and normal heart sounds.  No murmur heard. positive for  BLE edema, but left worse than right recent removal of left toenail - healing well . Pulmonary/Chest: Effort normal and breath sounds normal. No respiratory distress. Abdominal: Soft.  There is no tenderness. Psychiatric: Patient has a normal mood and affect. behavior is normal. Judgment and thought content normal.  Recent Results (from the past 2160 hour(s))  Lipid panel     Status: None   Collection Time: 11/13/20  1:56 PM  Result Value Ref Range   Cholesterol 154 <200 mg/dL   HDL 62 > OR = 40 mg/dL   Triglycerides 92 <150 mg/dL   LDL Cholesterol (Calc) 74 mg/dL (calc)    Comment: Reference range: <100 . Desirable range <100 mg/dL for primary prevention;   <70 mg/dL for patients with CHD or diabetic patients  with > or = 2 CHD risk factors. [name] Kitchen LDL-C is now calculated using the Martin-Hopkins  calculation, which is a validated novel method providing  better accuracy than  the Friedewald equation in the  estimation of LDL-C.  Cresenciano Genre et al. Annamaria Helling. 8299;371(69): 2061-2068  (http://education.[facility].com/faq/FAQ164)    Total CHOL/HDL Ratio 2.5 <5.0 (calc)   Non-HDL Cholesterol (Calc) 92 <130 mg/dL (calc)    Comment: For patients with diabetes plus 1 major ASCVD risk  factor, treating to a non-HDL-C goal of <100 mg/dL  (LDL-C of <70 mg/dL) is considered a therapeutic  option.   COMPLETE METABOLIC PANEL WITH GFR     Status: Abnormal   Collection Time: 11/13/20  1:56 PM  Result Value Ref Range   Glucose, Bld 108 (H) 65 - 99 mg/dL    Comment: .            Fasting reference interval . For someone without known diabetes, a glucose value between 100 and  125 mg/dL is consistent with prediabetes and should be confirmed with a follow-up test. .    BUN 49 (H) 7 - 25 mg/dL   Creat 3.96 (H) 0.70 - 1.11 mg/dL    Comment: For patients >93 years of age, the reference limit for Creatinine is approximately 13% higher for people identified as African-American. .    GFR, Est Non African American 13 (L) > OR = 60 mL/min/1.66m2   GFR, Est African American 15 (L) > OR = 60 mL/min/1.37m2   BUN/Creatinine Ratio 12 6 - 22 (calc)   Sodium 140 135 - 146 mmol/L   Potassium 5.3 3.5 - 5.3 mmol/L   Chloride 108 98 - 110 mmol/L   CO2 25 20 - 32 mmol/L   Calcium 10.1 8.6 - 10.3 mg/dL   Total Protein 7.7 6.1 - 8.1 g/dL   Albumin 4.3 3.6 - 5.1 g/dL   Globulin 3.4 1.9 - 3.7 g/dL (calc)   AG Ratio 1.3 1.0 - 2.5 (calc)   Total Bilirubin 0.6 0.2 - 1.2 mg/dL   Alkaline phosphatase (APISO) 65 35 - 144 U/L   AST 27 10 - 35 U/L    Comment: Results slightly increased due to hemolysis. [name] Kitchen    ALT 13 9 - 46 U/L  VITAMIN D 25 Hydroxy (Vit-D Deficiency, Fractures)     Status: Abnormal   Collection Time: 11/13/20  1:56 PM  Result Value Ref Range   Vit D, 25-Hydroxy 21 (L) 30 - 100 ng/mL    Comment: Vitamin D Status         25-OH Vitamin D: . Deficiency:                     <20 ng/mL Insufficiency:             20 - 29 ng/mL Optimal:                 > or = 30 ng/mL . For 25-OH Vitamin D testing on patients on  D2-supplementation and patients for whom quantitation  of D2 and D3 fractions is required, the [facility](TM) 25-OH VIT D, (D2,D3), LC/MS/MS is recommended: order  code [phone] (patients >51yrs). See Note 1 . Note 1 . For additional information, please refer to  http://education.[facility].com/faq/FAQ199  (This link is being provided for informational/ educational purposes only.)   Iron, TIBC and Ferritin Panel     Status: Abnormal   Collection Time: 11/13/20  1:56 PM  Result Value Ref Range   Iron 76 50 - 180 mcg/dL   TIBC 223 (L) 250 - 425 mcg/dL (calc)   %SAT 34 20 - 48 % (calc)   Ferritin 128 24 - 380 ng/mL  CBC with Differential/Platelet     Status: Abnormal   Collection Time: 11/13/20  1:56 PM  Result Value Ref Range   WBC 3.4 (L) 3.8 - 10.8 Thousand/uL   RBC 3.06 (L) 4.20 - 5.80 Million/uL   Hemoglobin 9.7 (L) 13.2 - 17.1 g/dL   HCT 28.8 (L) 38.5 - 50.0 %   MCV 94.1 80.0 - 100.0 fL   MCH 31.7 27.0 - 33.0 pg   MCHC 33.7 32.0 - 36.0 g/dL   RDW 12.3 11.0 - 15.0 %   Platelets 156 140 - 400 Thousand/uL   MPV 11.3 7.5 - 12.5 fL   Neutro Abs 1,608 1,500 - 7,800 cells/uL   Lymphs Abs 1,295 850 - 3,900 cells/uL   Absolute Monocytes 394 200 - 950 cells/uL   Eosinophils  Absolute 82 15 - 500 cells/uL   Basophils Absolute 20 0 - 200 cells/uL   Neutrophils Relative % 47.3 %   Total Lymphocyte 38.1 %   Monocytes Relative 11.6 %   Eosinophils Relative 2.4 %   Basophils Relative 0.6 %      PHQ2/9: Depression screen [facility] 2/9 01/16/2021 12/29/2020 11/13/2020 09/21/2020 07/14/2020  Decreased Interest 2 0 3 0 0  Down, Depressed, Hopeless 0 0 0 0 0  PHQ - 2 Score 2 0 3 0 0  Altered sleeping 0 - 0 - -  Tired, decreased energy 1 - 3 - -  Change in appetite 1 - 0 - -  Feeling bad or failure about yourself  1 - 0 - -  Trouble concentrating 2  - 0 - -  Moving slowly or fidgety/restless 1 - 0 - -  Suicidal thoughts 0 - 0 - -  PHQ-9 Score 8 - 6 - -  Difficult doing work/chores Somewhat difficult - - - -  Some recent data might be hidden    phq 9 is positive   Fall Risk: Fall Risk  01/16/2021 12/29/2020 11/13/2020 09/21/2020 07/14/2020  Falls in the past year? 0 0 0 0 0  Number falls in past yr: 0 0 0 0 0  Injury with Fall? 0 0 0 0 0  Risk for fall due to : - - - Impaired vision -  Risk for fall due to: Comment - - - - -  Follow up Falls evaluation completed - - Falls prevention discussed -     Functional Status Survey: Is the patient deaf or have difficulty hearing?: No Does the patient have difficulty seeing, even when wearing glasses/contacts?: Yes Does the patient have difficulty concentrating, remembering, or making decisions?: Yes Does the patient have difficulty walking or climbing stairs?: No Does the patient have difficulty dressing or bathing?: Yes Does the patient have difficulty doing errands alone such as visiting a doctor's office or shopping?: Yes   Assessment & Plan  1. Mild major depression ([place])  Wife wants to hold off on medication at this time, wait until next visit  2. Dementia without behavioral disturbance, unspecified dementia type (HCC)  - TSH - Vitamin B12 - VITAMIN D 25 Hydroxy (Vit-D Deficiency, Fractures) - donepezil (ARICEPT ODT) 10 MG disintegrating tablet; Take 1 tablet (10 mg total) by mouth at bedtime.  Dispense: 90 tablet; Refill: 1  3. Vitamin D deficiency

## 2021-01-16 ENCOUNTER — Ambulatory Visit (INDEPENDENT_AMBULATORY_CARE_PROVIDER_SITE_OTHER): Payer: Medicare HMO | Admitting: Family Medicine

## 2021-01-16 ENCOUNTER — Encounter: Payer: Self-pay | Admitting: Family Medicine

## 2021-01-16 ENCOUNTER — Other Ambulatory Visit: Payer: Self-pay

## 2021-01-16 VITALS — BP 132/74 | HR 75 | Temp 98.2°F | Resp 16 | Ht 74.0 in | Wt 172.8 lb

## 2021-01-16 DIAGNOSIS — F320 Major depressive disorder, single episode, mild: Secondary | ICD-10-CM

## 2021-01-16 DIAGNOSIS — E559 Vitamin D deficiency, unspecified: Secondary | ICD-10-CM

## 2021-01-16 DIAGNOSIS — F0390 Unspecified dementia without behavioral disturbance: Secondary | ICD-10-CM | POA: Diagnosis not present

## 2021-01-16 MED ORDER — DONEPEZIL HCL 10 MG PO TBDP: 10.0000 mg | ORAL_TABLET | Freq: Every day | ORAL | 1 refills | Status: DC

## 2021-01-17 LAB — VITAMIN D 25 HYDROXY (VIT D DEFICIENCY, FRACTURES): Vit D, 25-Hydroxy: 14 ng/mL — ABNORMAL LOW (ref 30–100)

## 2021-01-17 LAB — VITAMIN B12: Vitamin B-12: 302 pg/mL (ref 200–1100)

## 2021-01-17 LAB — TSH: TSH: 2.03 mIU/L (ref 0.40–4.50)

## 2021-01-31 DIAGNOSIS — N2581 Secondary hyperparathyroidism of renal origin: Secondary | ICD-10-CM | POA: Diagnosis not present

## 2021-01-31 DIAGNOSIS — E785 Hyperlipidemia, unspecified: Secondary | ICD-10-CM | POA: Diagnosis not present

## 2021-01-31 DIAGNOSIS — N184 Chronic kidney disease, stage 4 (severe): Secondary | ICD-10-CM | POA: Diagnosis not present

## 2021-01-31 DIAGNOSIS — N189 Chronic kidney disease, unspecified: Secondary | ICD-10-CM | POA: Diagnosis not present

## 2021-01-31 DIAGNOSIS — I129 Hypertensive chronic kidney disease with stage 1 through stage 4 chronic kidney disease, or unspecified chronic kidney disease: Secondary | ICD-10-CM | POA: Diagnosis not present

## 2021-01-31 DIAGNOSIS — I5030 Unspecified diastolic (congestive) heart failure: Secondary | ICD-10-CM | POA: Diagnosis not present

## 2021-01-31 DIAGNOSIS — C61 Malignant neoplasm of prostate: Secondary | ICD-10-CM | POA: Diagnosis not present

## 2021-01-31 DIAGNOSIS — I739 Peripheral vascular disease, unspecified: Secondary | ICD-10-CM | POA: Diagnosis not present

## 2021-01-31 DIAGNOSIS — D631 Anemia in chronic kidney disease: Secondary | ICD-10-CM | POA: Diagnosis not present

## 2021-01-31 DIAGNOSIS — K589 Irritable bowel syndrome without diarrhea: Secondary | ICD-10-CM | POA: Diagnosis not present

## 2021-02-01 ENCOUNTER — Telehealth: Payer: Self-pay | Admitting: Family Medicine

## 2021-02-01 NOTE — Telephone Encounter (Signed)
Caller states Nephrologists Dr. Justin Mend would like patient to d/c vitamin D and calcitRIOL (ROCALTROL) 0.25 MCG capsule due to patient calcium level being high. Spouse requesting specialist to send PCP report. Please follow up with spouse or patient at [phone]

## 2021-02-08 ENCOUNTER — Encounter: Payer: Self-pay | Admitting: Family Medicine

## 2021-02-19 DIAGNOSIS — N184 Chronic kidney disease, stage 4 (severe): Secondary | ICD-10-CM | POA: Diagnosis not present

## 2021-03-08 ENCOUNTER — Other Ambulatory Visit: Payer: Self-pay | Admitting: Family Medicine

## 2021-03-08 DIAGNOSIS — R2681 Unsteadiness on feet: Secondary | ICD-10-CM

## 2021-03-16 DIAGNOSIS — H401112 Primary open-angle glaucoma, right eye, moderate stage: Secondary | ICD-10-CM | POA: Diagnosis not present

## 2021-03-26 ENCOUNTER — Other Ambulatory Visit: Payer: Self-pay | Admitting: Family Medicine

## 2021-03-26 ENCOUNTER — Telehealth: Payer: Self-pay | Admitting: Family Medicine

## 2021-03-26 ENCOUNTER — Telehealth: Payer: Self-pay

## 2021-03-26 DIAGNOSIS — I129 Hypertensive chronic kidney disease with stage 1 through stage 4 chronic kidney disease, or unspecified chronic kidney disease: Secondary | ICD-10-CM

## 2021-03-26 DIAGNOSIS — E785 Hyperlipidemia, unspecified: Secondary | ICD-10-CM

## 2021-03-26 DIAGNOSIS — N183 Chronic kidney disease, stage 3 unspecified: Secondary | ICD-10-CM

## 2021-03-26 MED ORDER — ATORVASTATIN CALCIUM 40 MG PO TABS: 40.0000 mg | ORAL_TABLET | Freq: Every evening | ORAL | 0 refills | Status: DC

## 2021-03-26 MED ORDER — HYDRALAZINE HCL 25 MG PO TABS: 25.0000 mg | ORAL_TABLET | Freq: Three times a day (TID) | ORAL | 0 refills | Status: DC

## 2021-03-26 MED ORDER — DONEPEZIL HCL 10 MG PO TABS: 10.0000 mg | ORAL_TABLET | Freq: Every day | ORAL | 1 refills | Status: DC

## 2021-03-26 MED ORDER — AMLODIPINE BESYLATE 10 MG PO TABS: 10.0000 mg | ORAL_TABLET | Freq: Every day | ORAL | 0 refills | Status: DC

## 2021-03-26 NOTE — Telephone Encounter (Signed)
Medication Refill - Medication:  amLODipine (NORVASC) 10 MG tablet   atorvastatin (LIPITOR) 40 MG tablet  [facility]  Has the patient contacted their pharmacy? Yes.   (Agent: If no, request that the patient contact the pharmacy for the refill.) (Agent: If yes, when and what did the pharmacy advise?)  Preferred Pharmacy (with phone number or street name):  [facility] - [place], [place] - [address] Phone:  [phone]  Fax:  [phone]      Agent: Please be advised that RX refills may take up to 3 business days. We ask that you follow-up with your pharmacy.

## 2021-03-26 NOTE — Telephone Encounter (Signed)
Received a call from patients spouse stating the patient has changed pharmacies and the patient needs a refill on Hydralazine. She requested a 90 day supply be sent over to [facility].   Chart reviewed and rx sent over to [facility].   Rx(s) sent to pharmacy electronically.  Patient's spouse voiced understanding.

## 2021-03-26 NOTE — Telephone Encounter (Signed)
Mrs. Mcdowell called and wanted to let dr. Ancil Boozer know that pt can not take donepezil (ARICEPT ODT) 10 MG disintegrating tablet  She stated it made him so sick she thought he needed to go to the ER/ please advise

## 2021-04-19 DIAGNOSIS — C61 Malignant neoplasm of prostate: Secondary | ICD-10-CM | POA: Diagnosis not present

## 2021-04-19 DIAGNOSIS — R972 Elevated prostate specific antigen [PSA]: Secondary | ICD-10-CM | POA: Diagnosis not present

## 2021-05-03 DIAGNOSIS — D631 Anemia in chronic kidney disease: Secondary | ICD-10-CM | POA: Diagnosis not present

## 2021-05-03 DIAGNOSIS — I129 Hypertensive chronic kidney disease with stage 1 through stage 4 chronic kidney disease, or unspecified chronic kidney disease: Secondary | ICD-10-CM | POA: Diagnosis not present

## 2021-05-03 DIAGNOSIS — C61 Malignant neoplasm of prostate: Secondary | ICD-10-CM | POA: Diagnosis not present

## 2021-05-03 DIAGNOSIS — N184 Chronic kidney disease, stage 4 (severe): Secondary | ICD-10-CM | POA: Diagnosis not present

## 2021-05-03 DIAGNOSIS — K219 Gastro-esophageal reflux disease without esophagitis: Secondary | ICD-10-CM | POA: Diagnosis not present

## 2021-05-03 DIAGNOSIS — E785 Hyperlipidemia, unspecified: Secondary | ICD-10-CM | POA: Diagnosis not present

## 2021-05-03 DIAGNOSIS — N2581 Secondary hyperparathyroidism of renal origin: Secondary | ICD-10-CM | POA: Diagnosis not present

## 2021-05-03 DIAGNOSIS — N189 Chronic kidney disease, unspecified: Secondary | ICD-10-CM | POA: Diagnosis not present

## 2021-05-03 DIAGNOSIS — I5030 Unspecified diastolic (congestive) heart failure: Secondary | ICD-10-CM | POA: Diagnosis not present

## 2021-05-03 DIAGNOSIS — I739 Peripheral vascular disease, unspecified: Secondary | ICD-10-CM | POA: Diagnosis not present

## 2021-05-15 ENCOUNTER — Ambulatory Visit: Payer: Medicare HMO | Admitting: Family Medicine

## 2021-05-15 NOTE — Progress Notes (Deleted)
Name: Patrick [name].   MRN: [id number]    DOB: 11-25-1931   Date:05/15/2021       Progress Note  Subjective  Chief Complaint  Follow Up  HPI  HTN: He is on Norvasc and Hydralazine TID , he sees Dr. Justin Mend - nephrologist , recently seen by Dr. Radford Pax - cardiologist bp today is at goal.   Gait instability: wife is very concerned because he has been grabbing the walls when walking , also seems to be off balance at times, he is willing to have PT evaluate him at home. He does not drive, his wife transports him     Angina/pulmonary hypertension : no recent episodes of chest pain,  he has to use two pillows due to orthopnea , he still has sob with mild activity like taking a shower  Under the care of cardiologist    Prostate cancer : he has multiple bone scans and negative for metastasis No longer has bone pain. He is back on Lupron and last PSA improved.    Diastolic dysfunction stage II : may be the cause of SOB with activity, he has orthopnea but denies lower extremity edema   1. Left ventricular ejection fraction, by visual estimation, is 60 to 65%. The left ventricle has normal function. There is moderately increased left ventricular hypertrophy. 2. Left ventricular diastolic parameters are consistent with Grade II diastolic dysfunction (pseudonormalization). 3. Elevated left atrial pressure. 4. Global right ventricle has normal systolic function.The right ventricular size is mildly enlarged. 5. Small pericardial effusion. Measures up to 0.9cm adjacent to LV lateral wall 6. Left atrial size was mildly dilated. 7. Right atrial size was normal. 8. The mitral valve is normal in structure. No evidence of mitral valve regurgitation. 9. The tricuspid valve is normal in structure. Tricuspid valve regurgitation moderate. 10. The aortic valve is tricuspid. Aortic valve regurgitation is not visualized. Mild aortic valve sclerosis without stenosis. 11. The pulmonic valve was normal in  structure. Pulmonic valve regurgitation is trivial. 12. The inferior vena cava is normal in size with greater than 50% respiratory variability, suggesting right atrial pressure of 3 mmHg. 13. The tricuspid regurgitant velocity is 3.58 m/s, and with an assumed right atrial pressure of 3 mmHg, the estimated right ventricular systolic pressure is moderately elevated at 54.3 mmHg. Left Ventricle:   CKI stage IV continue follow up with nephrologist. - [facility], Dr. Justin Mend , fistula placed on left upper arm back in 11/2016 and is doing well, has not started HD yet, getting bp managed by nephrologist . He has secondary hyperparathyroidism, low vitamin D . Dr. Justin Mend did not check his vitamin D on his last labs back in Nov and we will check it today. He states he has good urine output and denies pruritis    Dyslipidemia: still taking Atorvastatin , compliant with medication , denies side effects. Last LDL was 56. No side effects we will recheck labs    Dementia:  wife states symptoms are stable, slightly forgetful, wife is now dispensing his medications.  MMS 19 first time, refuses to recheck He was on Aricept 10 mg, it was started on the  Fall 2018, seen by Neurologist. Wife stopped giving him the medication because it was not working. He stopped working in 2019 he retired after a fall Fall 2019. He is not quiet today, wife said he is forgetful like taking the rash 3 days before the day it was supposed to be placed outside, he was defensive  Glaucoma open angle : he is having blurred vision left eye, had surgery and is using eye drops, he has not been driving, he has swelling, diagnosed as severe, still able to see, he is compliant with eye appointments    PVD: swelling resolved, has some cramping, he wears compression stocking hoses , he does not walk far because he gets SOB, unchanged    Malnutrition: his weight has gone down from 220 lbs average to 182 lbs for a while lbut now is more in the 170  lbs range His wife is giving him protein shakes intermittently,however he eats very little and twice daily, explained he needs to add calories and eat more frequently to try to gain weight. We will continue to monitor   Major Depression  he was taking zoloft but wife stopped giving him medication , wife did most of the talking and he seemed annoyed at times. He likes sitting outside in the sun, wife is concerned because of medications, discussed compromising.   Patient Active Problem List   Diagnosis Date Noted   Unstable angina pectoris ([place]) 07/14/2020   Mild protein-calorie malnutrition ([place]) 07/14/2020   Mild major depression ([place]) 01/10/2020   CKD (chronic kidney disease) stage 3, GFR 30-59 ml/min (HCC) 01/60/1093   Metabolic acidosis, normal anion gap (NAG) 07/27/2018   Dementia without behavioral disturbance ([place]) 07/27/2018   Glaucoma 07/27/2018   Syncope 07/27/2018   Secondary hyperparathyroidism ([place]) 11/26/2016   Elevated prostate specific antigen (PSA) 09/08/2015   Bone pain 06/13/2015   Spinal stenosis 06/13/2015   OP (osteoporosis) 03/20/2015   Allergic rhinitis 03/20/2015   Lumbar canal stenosis 03/20/2015   Vitamin D deficiency 03/20/2015   Restless legs syndrome 03/20/2015   Drug-induced gynecomastia 03/20/2015   Diaphragmatic hernia 03/20/2015   Acquired cyst of kidney 03/20/2015   Left ventricular hypertrophy by electrocardiogram 03/20/2015   Leg pain 03/20/2015   History of pneumonia 03/20/2015   Non-rheumatic tricuspid valve insufficiency 23/55/7322   Dysmetabolic syndrome 02/54/2706   Pulmonary hypertension ([place]) 10/31/2014   Benign essential HTN 07/29/2014   Dyslipidemia 07/29/2014   PVD (peripheral vascular disease) ([place])    Anemia of chronic disease    IBS (irritable bowel syndrome)    Bradycardia    ED (erectile dysfunction) of organic origin 06/29/2012   Genuine stress incontinence, male 06/29/2012   Malignant neoplasm of prostate ([place]) 06/29/2012     Past Surgical History:  Procedure Laterality Date   BASCILIC VEIN TRANSPOSITION Left 09/05/2016   Procedure: FIRST STAGE BASILIC VEIN TRANSPOSITION left arm;  Surgeon: Serafina Mitchell, MD;  Location: [facility] OR;  Service: Vascular;  Laterality: Left;   BASCILIC VEIN TRANSPOSITION Left 11/14/2016   Procedure: BASCILIC VEIN TRANSPOSITION-LEFT ARM 2ND STAGE;  Surgeon: Serafina Mitchell, MD;  Location: [facility] OR;  Service: Vascular;  Laterality: Left;   COLONOSCOPY     EYE SURGERY Left 09/11/2017   PROSTATE SURGERY     PROSTATECTOMY      Family History  Problem Relation Age of Onset   CVA Mother    Hypertension Mother    Dementia Mother    Heart attack Father    Heart disease Father        before age 69   Hypertension Father    Hepatitis C Sister    Dementia Sister     Social History   Tobacco Use   Smoking status: Former    Packs/day: 0.30    Years: 20.00    Pack years: 6.00  Types: Cigarettes    Quit date: 08/26/1978    Years since quitting: 42.7   Smokeless tobacco: Never   Tobacco comments:    smoking cessation materials not required  Substance Use Topics   Alcohol use: No    Alcohol/week: 0.0 standard drinks     Current Outpatient Medications:    amLODipine (NORVASC) 10 MG tablet, Take 1 tablet (10 mg total) by mouth daily., Disp: 90 tablet, Rfl: 0   aspirin 81 MG tablet, Take 81 mg by mouth daily. , Disp: , Rfl:    atorvastatin (LIPITOR) 40 MG tablet, Take 1 tablet (40 mg total) by mouth every evening., Disp: 90 tablet, Rfl: 0   calcitRIOL (ROCALTROL) 0.25 MCG capsule, every other day. , Disp: , Rfl:    donepezil (ARICEPT) 10 MG tablet, Take 1 tablet (10 mg total) by mouth at bedtime., Disp: 90 tablet, Rfl: 1   hydrALAZINE (APRESOLINE) 25 MG tablet, Take 1 tablet (25 mg total) by mouth 3 (three) times daily., Disp: 270 tablet, Rfl: 0   vitamin C (ASCORBIC ACID) 500 MG tablet, Take 500 mg by mouth daily., Disp: , Rfl:    Vitamin E 400 UNITS CHEW, Chew 400 Units by  mouth daily. , Disp: , Rfl:   Allergies  Allergen Reactions   Ace Inhibitors Cough    cough    I personally reviewed {Reviewed:14835} with the patient/caregiver today.   ROS  ***  Objective  There were no vitals filed for this visit.  There is no height or weight on file to calculate BMI.  Physical Exam ***  No results found for this or any previous visit (from the past 2160 hour(s)).  Diabetic Foot Exam: Diabetic Foot Exam - Simple   No data filed    ***  PHQ2/9: Depression screen [facility] 2/9 01/16/2021 12/29/2020 11/13/2020 09/21/2020 07/14/2020  Decreased Interest 2 0 3 0 0  Down, Depressed, Hopeless 0 0 0 0 0  PHQ - 2 Score 2 0 3 0 0  Altered sleeping 0 - 0 - -  Tired, decreased energy 1 - 3 - -  Change in appetite 1 - 0 - -  Feeling bad or failure about yourself  1 - 0 - -  Trouble concentrating 2 - 0 - -  Moving slowly or fidgety/restless 1 - 0 - -  Suicidal thoughts 0 - 0 - -  PHQ-9 Score 8 - 6 - -  Difficult doing work/chores Somewhat difficult - - - -  Some recent data might be hidden    phq 9 is {gen pos [id number]} ***  Fall Risk: Fall Risk  01/16/2021 12/29/2020 11/13/2020 09/21/2020 07/14/2020  Falls in the past year? 0 0 0 0 0  Number falls in past yr: 0 0 0 0 0  Injury with Fall? 0 0 0 0 0  Risk for fall due to : - - - Impaired vision -  Risk for fall due to: Comment - - - - -  Follow up Falls evaluation completed - - Falls prevention discussed -   ***   Functional Status Survey:   ***   Assessment & Plan  *** There are no diagnoses linked to this encounter.

## 2021-06-08 ENCOUNTER — Other Ambulatory Visit: Payer: Self-pay

## 2021-06-08 ENCOUNTER — Encounter: Payer: Self-pay | Admitting: Cardiology

## 2021-06-08 ENCOUNTER — Ambulatory Visit: Payer: Medicare HMO | Admitting: Cardiology

## 2021-06-08 VITALS — BP 156/62 | HR 74 | Ht 74.0 in | Wt 166.8 lb

## 2021-06-08 DIAGNOSIS — I272 Pulmonary hypertension, unspecified: Secondary | ICD-10-CM

## 2021-06-08 DIAGNOSIS — I129 Hypertensive chronic kidney disease with stage 1 through stage 4 chronic kidney disease, or unspecified chronic kidney disease: Secondary | ICD-10-CM

## 2021-06-08 DIAGNOSIS — R9431 Abnormal electrocardiogram [ECG] [EKG]: Secondary | ICD-10-CM | POA: Diagnosis not present

## 2021-06-08 DIAGNOSIS — I10 Essential (primary) hypertension: Secondary | ICD-10-CM

## 2021-06-08 NOTE — Addendum Note (Signed)
Addended by: Antonieta Iba on: 06/08/2021 01:12 PM   Modules accepted: Orders

## 2021-06-08 NOTE — Progress Notes (Signed)
Cardiology Office Note:    Date:  06/08/2021   ID:  Patrick [name]., DOB Jan 20, 1932, MRN [id number]  PCP:  Steele Sizer, MD  Cardiologist:  None    Referring MD: Steele Sizer, MD   Chief Complaint  Patient presents with   Follow-up    Pulmonary HTN, HTN, Abnormal EKG    History of Present Illness:    Patrick [name]. is a 87 y.o. male with a hx of HTN, PVD, CKD stage III status post AV fistula placement followed by nephrology not on HD yet.  He also has a history of moderate pulmonary HTN (PASP 68mmHg on echo 2015) and dyslipidemia.  He has a chronically abnormal EKG with a normal Myoview in 2015.  He has chronic lower extremity edema which is controlled with compression hose.  He has been having progressive dementia symptoms placed on Aricept by neurology but it made him sick and he is not on it now.  Chest CT showed no acute findings except mild generalized cortical atrophy normal for his age with mild chronic microvascular ischemic changes.  He is here today for follow up.  His wife says his dementia is worsening and he is fatigued and sleeps a lot.  He denies any chest pain or pressure, SOB, DOE, PND, orthopnea, LE edema, dizziness, palpitations or syncope. He is compliant with his meds and is tolerating meds with no SE.     Past Medical History:  Diagnosis Date   Anemia    Arthritis    Bradycardia    CKD (chronic kidney disease), stage IV (HCC)    GERD (gastroesophageal reflux disease)    High cholesterol    Hypertension    IBS (irritable bowel syndrome)    Pneumonia    Prostate cancer ([place])    Pulmonary HTN ([place]) 11/05/2015   47mmHg on echo 2017   PVD (peripheral vascular disease) ([place])    Syncope 07/2018    Past Surgical History:  Procedure Laterality Date   BASCILIC VEIN TRANSPOSITION Left 09/05/2016   Procedure: FIRST STAGE BASILIC VEIN TRANSPOSITION left arm;  Surgeon: Serafina Mitchell, MD;  Location: [facility] OR;  Service: Vascular;  Laterality: Left;    BASCILIC VEIN TRANSPOSITION Left 11/14/2016   Procedure: BASCILIC VEIN TRANSPOSITION-LEFT ARM 2ND STAGE;  Surgeon: Serafina Mitchell, MD;  Location: [facility] OR;  Service: Vascular;  Laterality: Left;   COLONOSCOPY     EYE SURGERY Left 09/11/2017   PROSTATE SURGERY     PROSTATECTOMY      Current Medications: Current Meds  Medication Sig   amLODipine (NORVASC) 10 MG tablet Take 1 tablet (10 mg total) by mouth daily.   aspirin 81 MG tablet Take 81 mg by mouth daily.    atorvastatin (LIPITOR) 40 MG tablet Take 1 tablet (40 mg total) by mouth every evening.   hydrALAZINE (APRESOLINE) 25 MG tablet Take 1 tablet (25 mg total) by mouth 3 (three) times daily.   vitamin C (ASCORBIC ACID) 500 MG tablet Take 500 mg by mouth daily.   Vitamin E 400 UNITS CHEW Chew 400 Units by mouth daily.      Allergies:   Ace inhibitors   Social History   Socioeconomic History   Marital status: Married    Spouse name: Altha Harm   Number of children: 1   Years of education: Not on file   Highest education level: 6th grade  Occupational History   Occupation: Retired   Tobacco Use   Smoking status: Former  Packs/day: 0.30    Years: 20.00    Pack years: 6.00    Types: Cigarettes    Quit date: 08/26/1978    Years since quitting: 42.8   Smokeless tobacco: Never   Tobacco comments:    smoking cessation materials not required  Vaping Use   Vaping Use: Never used  Substance and Sexual Activity   Alcohol use: No    Alcohol/week: 0.0 standard drinks   Drug use: No   Sexual activity: Not Currently  Other Topics Concern   Not on file  Social History Narrative   Lives w/ wife   Caffeine use: none   Right handed    He retired 07/2018 because of medical problems   Social Determinants of Health   Financial Resource Strain: Low Risk    Difficulty of Paying Living Expenses: Not hard at all  Food Insecurity: No Food Insecurity   Worried About Charity fundraiser in the Last Year: Never true   Arboriculturist  in the Last Year: Never true  Transportation Needs: No Transportation Needs   Lack of Transportation (Medical): No   Lack of Transportation (Non-Medical): No  Physical Activity: Inactive   Days of Exercise per Week: 0 days   Minutes of Exercise per Session: 0 min  Stress: No Stress Concern Present   Feeling of Stress : Not at all  Social Connections: Moderately Integrated   Frequency of Communication with Friends and Family: More than three times a week   Frequency of Social Gatherings with Friends and Family: Never   Attends Religious Services: More than 4 times per year   Active Member of [employer] or Organizations: No   Attends Archivist Meetings: Never   Marital Status: Married     Family History: The patient's family history includes CVA in his mother; Dementia in his mother and sister; Heart attack in his father; Heart disease in his father; Hepatitis C in his sister; Hypertension in his father and mother.  ROS:   Please see the history of present illness.    ROS  All other systems reviewed and negative.   EKGs/Labs/Other Studies Reviewed:    The following studies were reviewed today: none  EKG:  EKG is  ordered today.  The ekg ordered today demonstrates NSR with nonspecific T wave abnormality  Recent Labs: 11/13/2020: ALT 13; BUN 49; Creat 3.96; Hemoglobin 9.7; Platelets 156; Potassium 5.3; Sodium 140 01/16/2021: TSH 2.03   Recent Lipid Panel    Component Value Date/Time   CHOL 154 11/13/2020 1356   CHOL 233 (H) 09/27/2015 0958   TRIG 92 11/13/2020 1356   HDL 62 11/13/2020 1356   HDL 78 09/27/2015 0958   CHOLHDL 2.5 11/13/2020 1356   VLDL 13 03/28/2017 0945   LDLCALC 74 11/13/2020 1356    Physical Exam:    VS:  BP (!) 156/62   Pulse 74   Ht 6\' 2"  (1.88 m)   Wt 166 lb 12.8 oz (75.7 kg)   SpO2 99%   BMI 21.42 kg/m     Wt Readings from Last 3 Encounters:  06/08/21 166 lb 12.8 oz (75.7 kg)  01/16/21 172 lb 12.8 oz (78.4 kg)  12/29/20 173 lb (78.5  kg)    GEN: Well nourished, well developed in no acute distress HEENT: Normal NECK: No JVD; No carotid bruits LYMPHATICS: No lymphadenopathy CARDIAC:RRR, no murmurs, rubs, gallops RESPIRATORY:  Clear to auscultation without rales, wheezing or rhonchi  ABDOMEN: Soft, non-tender, non-distended MUSCULOSKELETAL:  No edema; No deformity  SKIN: Warm and dry NEUROLOGIC:  Alert and oriented x 3 PSYCHIATRIC:  Normal affect   ASSESSMENT:    1. Pulmonary hypertension ([place])   2. Benign essential HTN   3. Hypertensive kidney disease with CKD (chronic kidney disease), stage 1-4 or unspecified chronic kidney disease   4. Nonspecific abnormal electrocardiogram (ECG) (EKG)    PLAN:    In order of problems listed above:  1.  Pulmonary HTN -moderate pulmonary HTN (PASP 73mmHg on echo 07/2019) -he was supposed to have a followup echo last year but did not follow through -repeat 2D echo to reassess PAP -normal perfusion scan 08/2019 -PFTs were never done>>if PASP still high will need PFTs  2. Hypertension -BP is borderline controlled on exam today -continue prescription drug management with amlodipine 10mg  daily, Hydralazine 25mg  TID with PRN refills -follow up with nephrology for more BP management  3. CKD stage 4 -followed by nephrology -his last creatinne was 4.14 in June 2022  4.  Abnormal EKG -T wave inversions in V1 and V2 >nonspecific T wave abnormality on EKG today -he is asymptomatic -lexi myoview with no ischemia in 2017 and asymptomatic  Medication Adjustments/Labs and Tests Ordered: Current medicines are reviewed at length with the patient today.  Concerns regarding medicines are outlined above.  Orders Placed This Encounter  Procedures   EKG 12-Lead    No orders of the defined types were placed in this encounter.   Signed, Fransico Him, MD  06/08/2021 1:04 PM    [facility]

## 2021-06-08 NOTE — Patient Instructions (Signed)
Medication Instructions:  °Your physician recommends that you continue on your current medications as directed. Please refer to the Current Medication list given to you today. ° °*If you need a refill on your cardiac medications before your next appointment, please call your pharmacy* ° °Testing/Procedures: °Your physician has requested that you have an echocardiogram. Echocardiography is a painless test that uses sound waves to create images of your heart. It provides your doctor with information about the size and shape of your heart and how well your heart’s chambers and valves are working. This procedure takes approximately one hour. There are no restrictions for this procedure. ° °Follow-Up: °At [facility], you and your health needs are our priority.  As part of our continuing mission to provide you with exceptional heart care, we have created designated Provider Care Teams.  These Care Teams include your primary Cardiologist (physician) and Advanced Practice Providers (APPs -  Physician Assistants and Nurse Practitioners) who all work together to provide you with the care you need, when you need it. ° °Your next appointment:   °1 year(s) ° °The format for your next appointment:   °In Person ° °Provider:   °Traci Turner, MD   ° ° ° °

## 2021-06-12 ENCOUNTER — Other Ambulatory Visit: Payer: Self-pay

## 2021-06-12 ENCOUNTER — Ambulatory Visit (INDEPENDENT_AMBULATORY_CARE_PROVIDER_SITE_OTHER): Payer: Medicare HMO | Admitting: Family Medicine

## 2021-06-12 ENCOUNTER — Encounter: Payer: Self-pay | Admitting: Family Medicine

## 2021-06-12 VITALS — BP 132/64 | HR 77 | Temp 98.1°F | Resp 16 | Ht 74.0 in | Wt 169.0 lb

## 2021-06-12 DIAGNOSIS — I272 Pulmonary hypertension, unspecified: Secondary | ICD-10-CM | POA: Diagnosis not present

## 2021-06-12 DIAGNOSIS — Z23 Encounter for immunization: Secondary | ICD-10-CM

## 2021-06-12 DIAGNOSIS — C61 Malignant neoplasm of prostate: Secondary | ICD-10-CM | POA: Diagnosis not present

## 2021-06-12 DIAGNOSIS — N183 Hypertensive chronic kidney disease with stage 1 through stage 4 chronic kidney disease, or unspecified chronic kidney disease: Secondary | ICD-10-CM

## 2021-06-12 DIAGNOSIS — N2581 Secondary hyperparathyroidism of renal origin: Secondary | ICD-10-CM

## 2021-06-12 DIAGNOSIS — D638 Anemia in other chronic diseases classified elsewhere: Secondary | ICD-10-CM

## 2021-06-12 DIAGNOSIS — I739 Peripheral vascular disease, unspecified: Secondary | ICD-10-CM

## 2021-06-12 DIAGNOSIS — E785 Hyperlipidemia, unspecified: Secondary | ICD-10-CM

## 2021-06-12 DIAGNOSIS — E441 Mild protein-calorie malnutrition: Secondary | ICD-10-CM

## 2021-06-12 DIAGNOSIS — I10 Essential (primary) hypertension: Secondary | ICD-10-CM | POA: Diagnosis not present

## 2021-06-12 DIAGNOSIS — I129 Hypertensive chronic kidney disease with stage 1 through stage 4 chronic kidney disease, or unspecified chronic kidney disease: Secondary | ICD-10-CM | POA: Diagnosis not present

## 2021-06-12 DIAGNOSIS — E559 Vitamin D deficiency, unspecified: Secondary | ICD-10-CM

## 2021-06-12 DIAGNOSIS — N184 Chronic kidney disease, stage 4 (severe): Secondary | ICD-10-CM | POA: Diagnosis not present

## 2021-06-12 MED ORDER — AMLODIPINE BESYLATE 10 MG PO TABS: 10.0000 mg | ORAL_TABLET | Freq: Every day | ORAL | 1 refills | Status: DC

## 2021-06-12 MED ORDER — ATORVASTATIN CALCIUM 40 MG PO TABS: 40.0000 mg | ORAL_TABLET | Freq: Every evening | ORAL | 1 refills | Status: DC

## 2021-06-12 NOTE — Progress Notes (Signed)
Name: Patrick [name].   MRN: [id number]    DOB: 1931/12/20   Date:06/12/2021       Progress Note  Subjective  Chief Complaint  Follow Up  HPI  HTN: He is on Norvasc and Hydralazine TID , he sees Dr. Justin Mend - nephrologist , and had labs done recently, we will obtain results   Gait instability: wife is very concerned because he has been grabbing the walls when walking , also seems to be off balance at times, he had home PT and was given instructions to exercise but he has not been active as recommended     Angina/pulmonary hypertension : no recent episodes of chest pain,  he has to use two pillows due to orthopnea , he still has sob with mild activity like taking a shower  He is swelling on ankles again, explained needs to raise legs or move more often around the house and wear compression stocking hoses    Prostate cancer : he has multiple bone scans and negative for metastasis No longer has bone pain. He is back on Lupron and last PSA stable.   Diastolic dysfunction stage II : may be the cause of SOB with activity, he has orthopnea and having lower extremity edema again   1. Left ventricular ejection fraction, by visual estimation, is 60 to 65%. The left ventricle has normal function. There is moderately increased left ventricular hypertrophy. 2. Left ventricular diastolic parameters are consistent with Grade II diastolic dysfunction (pseudonormalization). 3. Elevated left atrial pressure. 4. Global right ventricle has normal systolic function.The right ventricular size is mildly enlarged. 5. Small pericardial effusion. Measures up to 0.9cm adjacent to LV lateral wall 6. Left atrial size was mildly dilated. 7. Right atrial size was normal. 8. The mitral valve is normal in structure. No evidence of mitral valve regurgitation. 9. The tricuspid valve is normal in structure. Tricuspid valve regurgitation moderate. 10. The aortic valve is tricuspid. Aortic valve regurgitation is not  visualized. Mild aortic valve sclerosis without stenosis. 11. The pulmonic valve was normal in structure. Pulmonic valve regurgitation is trivial. 12. The inferior vena cava is normal in size with greater than 50% respiratory variability, suggesting right atrial pressure of 3 mmHg. 13. The tricuspid regurgitant velocity is 3.58 m/s, and with an assumed right atrial pressure of 3 mmHg, the estimated right ventricular systolic pressure is moderately elevated at 54.3 mmHg. Left Ventricle:   CKI stage IV continue follow up with nephrologist. - [facility], Dr. Justin Mend , fistula placed on left upper arm back in 11/2016 and is doing well, has not started HD yet, getting bp managed by nephrologist, Dr. Justin Mend . He has secondary hyperparathyroidism, low vitamin D . Dr. Justin Mend recently stopped his calcium and vitamin D due to levels being high.He states he has good urine output and denies pruritis    Dyslipidemia: still taking Atorvastatin , compliant with medication , denies side effects. Last LDL was a little higher at 74    Dementia:  wife states symptoms are stable, slightly forgetful, wife is now dispensing his medications.  MMS 19 first time, refuses to recheck He was on Aricept 10 mg, it was started on the  Fall 2018, seen by Neurologist. Wife stopped giving him the medication because it was not working. He stopped working in 2019 he retired after a fall Fall 2019. We sent Aricept during his last visit but it causes nausea and vomiting and she had to stop giving it to him again  Glaucoma open angle : he is having blurred vision left eye, had surgery and is using eye drops, he has not been driving,  diagnosed as severe, still able to see, he is compliant with eye appointments every 6 months    PVD: swelling resolved, has some cramping, he wears compression stocking hoses , he does not walk far because he gets SOB. Discussed importance of moving    Malnutrition: his weight is down from 220 lbs to  average in the high 160 lbs, discussed importance of frequent meals and also to add calories to his diet   Major Depression  he was taking zoloft but wife stopped giving him medication , wife did most of the talking and he seemed annoyed at times.Seems quieter than usual today   Prostate Cancer: still sees Urologist, had Lupron last year and level has been down   Patient Active Problem List   Diagnosis Date Noted   Unstable angina pectoris ([place]) 07/14/2020   Mild protein-calorie malnutrition ([place]) 07/14/2020   Mild major depression ([place]) 01/10/2020   CKD (chronic kidney disease) stage 3, GFR 30-59 ml/min (HCC) 75/64/3329   Metabolic acidosis, normal anion gap (NAG) 07/27/2018   Dementia without behavioral disturbance ([place]) 07/27/2018   Glaucoma 07/27/2018   Syncope 07/27/2018   Secondary hyperparathyroidism ([place]) 11/26/2016   Elevated prostate specific antigen (PSA) 09/08/2015   Bone pain 06/13/2015   Spinal stenosis 06/13/2015   OP (osteoporosis) 03/20/2015   Allergic rhinitis 03/20/2015   Lumbar canal stenosis 03/20/2015   Vitamin D deficiency 03/20/2015   Restless legs syndrome 03/20/2015   Drug-induced gynecomastia 03/20/2015   Diaphragmatic hernia 03/20/2015   Acquired cyst of kidney 03/20/2015   Left ventricular hypertrophy by electrocardiogram 03/20/2015   Leg pain 03/20/2015   History of pneumonia 03/20/2015   Non-rheumatic tricuspid valve insufficiency 51/88/4166   Dysmetabolic syndrome 03/08/1600   Pulmonary hypertension ([place]) 10/31/2014   Benign essential HTN 07/29/2014   Dyslipidemia 07/29/2014   PVD (peripheral vascular disease) ([place])    Anemia of chronic disease    IBS (irritable bowel syndrome)    Bradycardia    ED (erectile dysfunction) of organic origin 06/29/2012   Genuine stress incontinence, male 06/29/2012   Malignant neoplasm of prostate ([place]) 06/29/2012    Past Surgical History:  Procedure Laterality Date   BASCILIC VEIN TRANSPOSITION Left  09/05/2016   Procedure: FIRST STAGE BASILIC VEIN TRANSPOSITION left arm;  Surgeon: Serafina Mitchell, MD;  Location: [facility] OR;  Service: Vascular;  Laterality: Left;   BASCILIC VEIN TRANSPOSITION Left 11/14/2016   Procedure: BASCILIC VEIN TRANSPOSITION-LEFT ARM 2ND STAGE;  Surgeon: Serafina Mitchell, MD;  Location: [facility] OR;  Service: Vascular;  Laterality: Left;   COLONOSCOPY     EYE SURGERY Left 09/11/2017   PROSTATE SURGERY     PROSTATECTOMY      Family History  Problem Relation Age of Onset   CVA Mother    Hypertension Mother    Dementia Mother    Heart attack Father    Heart disease Father        before age 80   Hypertension Father    Hepatitis C Sister    Dementia Sister     Social History   Tobacco Use   Smoking status: Former    Packs/day: 0.30    Years: 20.00    Pack years: 6.00    Types: Cigarettes    Quit date: 08/26/1978    Years since quitting: 42.8   Smokeless tobacco: Never  Tobacco comments:    smoking cessation materials not required  Substance Use Topics   Alcohol use: No    Alcohol/week: 0.0 standard drinks     Current Outpatient Medications:    amLODipine (NORVASC) 10 MG tablet, Take 1 tablet (10 mg total) by mouth daily., Disp: 90 tablet, Rfl: 0   aspirin 81 MG tablet, Take 81 mg by mouth daily. , Disp: , Rfl:    atorvastatin (LIPITOR) 40 MG tablet, Take 1 tablet (40 mg total) by mouth every evening., Disp: 90 tablet, Rfl: 0   hydrALAZINE (APRESOLINE) 25 MG tablet, Take 1 tablet (25 mg total) by mouth 3 (three) times daily., Disp: 270 tablet, Rfl: 0   vitamin C (ASCORBIC ACID) 500 MG tablet, Take 500 mg by mouth daily., Disp: , Rfl:    Vitamin E 400 UNITS CHEW, Chew 400 Units by mouth daily. , Disp: , Rfl:    calcitRIOL (ROCALTROL) 0.25 MCG capsule, every other day.  (Patient not taking: No sig reported), Disp: , Rfl:    donepezil (ARICEPT) 10 MG tablet, Take 1 tablet (10 mg total) by mouth at bedtime. (Patient not taking: No sig reported), Disp: 90 tablet,  Rfl: 1  Allergies  Allergen Reactions   Ace Inhibitors Cough    cough    I personally reviewed active problem list, medication list, allergies, family history, social history, health maintenance with the patient/caregiver today.   ROS  Ten systems reviewed and is negative except as mentioned in HPI   Objective  Vitals:   06/12/21 1322  BP: 132/64  Pulse: 77  Resp: 16  Temp: 98.1 F (36.7 C)  SpO2: 98%  Weight: 169 lb (76.7 kg)  Height: 6\' 2"  (1.88 m)    Body mass index is 21.7 kg/m.  Physical Exam  Constitutional: Patient appears well-developed and well-nourished. Obese  No distress.  HEENT: head atraumatic, normocephalic, pupils equal and reactive to light, neck supple, throat within normal limits Cardiovascular: Normal rate, regular rhythm and normal heart sounds.  No murmur heard. 2 plus ankle  edema. Pulmonary/Chest: Effort normal and breath sounds normal. No respiratory distress. Abdominal: Soft.  There is no tenderness. Psychiatric: Patient has a normal mood and affect. behavior is normal. Quiet but cooperative    PHQ2/9: Depression screen [facility] 2/9 06/12/2021 01/16/2021 12/29/2020 11/13/2020 09/21/2020  Decreased Interest 0 2 0 3 0  Down, Depressed, Hopeless 0 0 0 0 0  PHQ - 2 Score 0 2 0 3 0  Altered sleeping 0 0 - 0 -  Tired, decreased energy 0 1 - 3 -  Change in appetite 0 1 - 0 -  Feeling bad or failure about yourself  0 1 - 0 -  Trouble concentrating 0 2 - 0 -  Moving slowly or fidgety/restless 0 1 - 0 -  Suicidal thoughts 0 0 - 0 -  PHQ-9 Score 0 8 - 6 -  Difficult doing work/chores - Somewhat difficult - - -  Some recent data might be hidden    phq 9 is negative   Fall Risk: Fall Risk  06/12/2021 01/16/2021 12/29/2020 11/13/2020 09/21/2020  Falls in the past year? 0 0 0 0 0  Number falls in past yr: 0 0 0 0 0  Injury with Fall? 0 0 0 0 0  Risk for fall due to : Impaired balance/gait - - - Impaired vision  Risk for fall due to: Comment - - - - -   Follow up Falls prevention discussed Falls evaluation completed - -  Falls prevention discussed      Functional Status Survey: Is the patient deaf or have difficulty hearing?: No Does the patient have difficulty seeing, even when wearing glasses/contacts?: Yes Does the patient have difficulty concentrating, remembering, or making decisions?: Yes Does the patient have difficulty walking or climbing stairs?: No Does the patient have difficulty dressing or bathing?: No Does the patient have difficulty doing errands alone such as visiting a doctor's office or shopping?: Yes    Assessment & Plan  1. Benign hypertension with chronic kidney disease, stage III (HCC)  - amLODipine (NORVASC) 10 MG tablet; Take 1 tablet (10 mg total) by mouth daily.  Dispense: 90 tablet; Refill: 1  2. Need for immunization against influenza  - Flu Vaccine QUAD High Dose(Fluad)  3. Dyslipidemia  - atorvastatin (LIPITOR) 40 MG tablet; Take 1 tablet (40 mg total) by mouth every evening.  Dispense: 90 tablet; Refill: 1  4. Prostate cancer ([place])   5. Chronic kidney disease, stage IV (severe) ([place])   6. Vitamin D deficiency   7. Benign essential HTN   8. Mild protein-calorie malnutrition ([place])   9. Pulmonary hypertension ([place])   10. Secondary hyperparathyroidism of renal origin ([place])   11. Benign hypertension with chronic kidney disease, stage IV ([place])   12. PVD (peripheral vascular disease) ([place])   13. Anemia of chronic disease

## 2021-06-12 NOTE — Patient Instructions (Signed)
High-Protein and High-Calorie Diet Eating high-protein and high-calorie foods can help you to gain weight, heal after an injury, and recover after an illness or surgery. The specific amount of daily protein and calories you need depends on: Your body weight. The reason this diet is recommended for you. Generally, a high-protein, high-calorie diet involves: Eating 250-500 extra calories each day. Making sure that you get enough of your daily calories from protein. Ask your health care provider how many of your calories should come from protein. Talk with a health care provider or a dietitian about how much protein and how many calories you need each day. Follow the diet as directed by your health care provider. What are tips for following this plan? Reading food labels Check the nutrition facts label for calories, grams of fat and protein. Items with more than 4 grams of protein are high-protein foods. Preparing meals Add whole milk, half-and-half, or heavy cream to cereal, pudding, soup, or hot cocoa. Add whole milk to instant breakfast drinks. Add peanut butter to oatmeal or smoothies. Add powdered milk to baked goods, smoothies, or milkshakes. Add powdered milk, cream, or butter to mashed potatoes. Add cheese to cooked vegetables. Make whole-milk yogurt parfaits. Top them with granola, fruit, or nuts. Add cottage cheese to fruit. Add avocado, cheese, or both to sandwiches or salads. Add avocado to smoothies. Add meat, poultry, or seafood to rice, pasta, casseroles, salads, and soups. Use mayonnaise when making egg salad, chicken salad, or tuna salad. Use peanut butter as a dip for fruits and vegetables or as a topping for pretzels, celery, or crackers. Add beans to casseroles, dips, and spreads. Add pureed beans to sauces and soups. Replace calorie-free drinks with calorie-containing drinks, such as milk and fruit juice. Replace water with milk or heavy cream when making foods such as  oatmeal, pudding, or cocoa. Add oil or butter to cooked vegetables and grains. Add cream cheese to sandwiches or as a topping on crackers and bread. Make cream-based pastas and soups. General information Ask your health care provider if you should take a nutritional supplement. Try to eat six small meals each day instead of three large meals. A general goal is to eat every 2 to 3 hours. Eat a balanced diet. In each meal, include one food that is high in protein and one food with fat in it. Keep nutritious snacks available, such as nuts, trail mixes, dried fruit, and yogurt. If you have kidney disease or diabetes, talk with your health care provider about how much protein is safe for you. Too much protein may put extra stress on your kidneys. Drink your calories. Choose high-calorie drinks and have them after your meals. Consider setting a timer to remind you to eat. You will want to eat even if you do not feel very hungry. What high-protein foods should I eat? Vegetables Soybeans. Peas. Grains Quinoa. Bulgur wheat. Buckwheat. Meats and other proteins Beef, pork, and poultry. Fish and seafood. Eggs. Tofu. Textured vegetable protein (TVP). Peanut butter. Nuts and seeds. Dried beans. Protein powders. Hummus. Dairy Whole milk. Whole-milk yogurt. Powdered milk. Cheese. Cottage Cheese. Eggnog. Beverages High-protein supplement drinks. Soy milk. Other foods Protein bars. The items listed above may not be a complete list of foods and beverages you can eat and drink. Contact a dietitian for more information. What high-calorie foods should I eat? Fruits Dried fruit. Fruit leather. Canned fruit in syrup. Fruit juice. Avocado. Vegetables Vegetables cooked in oil or butter. Fried potatoes. Grains Pasta. Quick   breads. Muffins. Pancakes. Ready-to-eat cereal. Meats and other proteins Peanut butter. Nuts and seeds. Dairy Heavy cream. Whipped cream. Cream cheese. Sour cream. Ice cream. Custard.  Pudding. Whole milk dairy products. Beverages Meal-replacement beverages. Nutrition shakes. Fruit juice. Seasonings and condiments Salad dressing. Mayonnaise. Alfredo sauce. Fruit preserves or jelly. Honey. Syrup. Sweets and desserts Cake. Cookies. Pie. Pastries. Candy bars. Chocolate. Fats and oils Butter or margarine. Oil. Gravy. Other foods Meal-replacement bars. The items listed above may not be a complete list of foods and beverages you can eat and drink. Contact a dietitian for more information. Summary A high-protein, high-calorie diet can help you gain weight or heal faster after an injury, illness, or surgery. To increase your protein and calories, add ingredients such as whole milk, peanut butter, cheese, beans, meat, or seafood to meal items. To get enough extra calories each day, include high-calorie foods and beverages at each meal. Adding a high-calorie drink or shake can be an easy way to help you get enough calories each day. Talk with your healthcare provider or dietitian about the best options for you. This information is not intended to replace advice given to you by your health care provider. Make sure you discuss any questions you have with your health care provider. Document Revised: 07/30/2020 Document Reviewed: 07/30/2020 Elsevier Patient Education  2022 Elsevier Inc.  

## 2021-06-27 ENCOUNTER — Ambulatory Visit (HOSPITAL_COMMUNITY): Payer: Medicare HMO | Attending: Cardiology

## 2021-06-27 ENCOUNTER — Other Ambulatory Visit: Payer: Self-pay

## 2021-06-27 DIAGNOSIS — I272 Pulmonary hypertension, unspecified: Secondary | ICD-10-CM

## 2021-06-27 LAB — ECHOCARDIOGRAM COMPLETE
AR max vel: 2.47 cm2
AV Area VTI: 2.41 cm2
AV Area mean vel: 2.25 cm2
AV Mean grad: 8.8 mmHg
AV Peak grad: 20 mmHg
Ao pk vel: 2.24 m/s
Area-P 1/2: 3.12 cm2
S' Lateral: 3 cm

## 2021-06-29 ENCOUNTER — Telehealth: Payer: Self-pay

## 2021-06-29 DIAGNOSIS — I272 Pulmonary hypertension, unspecified: Secondary | ICD-10-CM

## 2021-06-29 NOTE — Telephone Encounter (Signed)
-----   Message from Sueanne Margarita, MD sent at 06/27/2021 10:23 PM EDT ----- 2D echo showed normal heart function with mildly thickened heart function, severe pulmonary HTN, moderately dilated LA, milldy leaky MV, moderately leaky TV>>compared to prior echo, PAP have increased  and there is a moderate pericardial effusion.  He was supposed to have VQ scan and PFTs as well as sleep study in2020 for PHTN but these were never done - please order VQ scan, PFTs with DLCO and home sleep study for pulmonary HTN and refer to Dr. Aundra Dubin for further evaulation.

## 2021-06-29 NOTE — Telephone Encounter (Signed)
The patient's wife has been notified of the result and verbalized understanding.  All questions (if any) were answered. Antonieta Iba, RN 06/29/2021 11:19 AM   The patient did have a VQ scan in 2020. PFTs and sleep study were never done. Patient's wife states that he still does not want to have a sleep study done. He is willing to have PFTs and see Dr. Aundra Dubin. Orders have been placed.

## 2021-07-02 ENCOUNTER — Other Ambulatory Visit: Payer: Self-pay | Admitting: Cardiology

## 2021-07-02 NOTE — Telephone Encounter (Signed)
Spoke with the patient's wife who states that she will talk with her husband about the sleep test and call back to let us know what he decides.

## 2021-07-12 ENCOUNTER — Telehealth: Payer: Self-pay | Admitting: Cardiology

## 2021-07-12 NOTE — Telephone Encounter (Signed)
Patient's wife called to say that she hasn't heard from the pulmonary dr and she states that our office was supposed to refer the patient. Please advise

## 2021-07-12 NOTE — Telephone Encounter (Signed)
Reviewed pt's last office note and did not see any mention of pt needing pulmonary referral Will forward to Dr Radford Pax for recommendations .Adonis Housekeeper

## 2021-07-19 NOTE — Telephone Encounter (Signed)
Patient declines home sleep study and VQ scan.  He is willing to have PFTs done. Will send to schedule to arrange. Referral has been sent to [facility] for patient to be seen.

## 2021-07-26 DIAGNOSIS — C61 Malignant neoplasm of prostate: Secondary | ICD-10-CM | POA: Diagnosis not present

## 2021-07-26 DIAGNOSIS — I129 Hypertensive chronic kidney disease with stage 1 through stage 4 chronic kidney disease, or unspecified chronic kidney disease: Secondary | ICD-10-CM | POA: Diagnosis not present

## 2021-07-26 DIAGNOSIS — E785 Hyperlipidemia, unspecified: Secondary | ICD-10-CM | POA: Diagnosis not present

## 2021-07-26 DIAGNOSIS — K219 Gastro-esophageal reflux disease without esophagitis: Secondary | ICD-10-CM | POA: Diagnosis not present

## 2021-07-26 DIAGNOSIS — N184 Chronic kidney disease, stage 4 (severe): Secondary | ICD-10-CM | POA: Diagnosis not present

## 2021-07-26 DIAGNOSIS — D631 Anemia in chronic kidney disease: Secondary | ICD-10-CM | POA: Diagnosis not present

## 2021-07-26 DIAGNOSIS — I739 Peripheral vascular disease, unspecified: Secondary | ICD-10-CM | POA: Diagnosis not present

## 2021-07-26 DIAGNOSIS — N189 Chronic kidney disease, unspecified: Secondary | ICD-10-CM | POA: Diagnosis not present

## 2021-07-26 DIAGNOSIS — I5030 Unspecified diastolic (congestive) heart failure: Secondary | ICD-10-CM | POA: Diagnosis not present

## 2021-07-26 DIAGNOSIS — N2581 Secondary hyperparathyroidism of renal origin: Secondary | ICD-10-CM | POA: Diagnosis not present

## 2021-08-06 ENCOUNTER — Encounter: Payer: Self-pay | Admitting: Family Medicine

## 2021-08-21 ENCOUNTER — Ambulatory Visit: Payer: Self-pay | Admitting: *Deleted

## 2021-08-21 NOTE — Telephone Encounter (Signed)
Reason for Disposition  [1] MODERATE pain (e.g., interferes with normal activities, limping) AND [2] present > 3 days  Answer Assessment - Initial Assessment Questions 1. LOCATION: "Where is the swelling located?"  (e.g., left, right, both knees)     Left knee inner side of knee "knot"  2. SIZE and DESCRIPTION: "What does the swelling look like?"  (e.g., entire knee, localized)     Dark in color hard to touch  3. ONSET: "When did the swelling start?" "Does it come and go, or is it there all the time?"     Since October now painful to touch, limping to wak 4. PAIN: "Is there any pain?" If Yes, ask: "How bad is it?" (Scale 1-10; or mild, moderate, severe)     Painful to touch  5. SETTING: "Has there been any recent work, exercise or other activity that involved that part of the body?"      no 6. AGGRAVATING FACTORS: "What makes the knee swelling worse?" (e.g., walking, climbing stairs, running)     Walking  7. ASSOCIATED SYMPTOMS: "Is there any pain or redness?"     Warm to touch , dark in color  8. OTHER SYMPTOMS: "Do you have any other symptoms?" (e.g., chest pain, difficulty breathing, fever, calf pain)     Ankle swollen not leg  9. PREGNANCY: "Is there any chance you are pregnant?" "When was your last menstrual period?"     na  Protocols used: Knee Swelling-A-AH

## 2021-08-21 NOTE — Telephone Encounter (Signed)
C/o left leg knot , painful to touch and hard to walk.  Called patient and spoke to wife , on [place]. Review sx left knee with "knot" on inner side of knee. Started hurting today painful walking , limping. Warm to touch, dark in color, hard. No redness, no fever. Denies chest pain difficulty breathing. Ankle swelling no leg swelling. Earliest appt 08/24/21. On wait list for earlier appt. Care advise given. Patient's wife verbalized understanding of care advise and to call back or go t UC or ED if symptoms worsen.

## 2021-08-23 NOTE — Progress Notes (Signed)
Name: Patrick [name].   MRN: [id number]    DOB: 1932-09-06   Date:08/24/2021       Progress Note  Subjective  Chief Complaint  Acute visit for inflammation and knot presenting on left inner knee  HPI  Mass leg: going on for months, however growing quickly recently and caused pain 5 days ago. No redness or increase in warmth . Denies fever or chills.   Patient Active Problem List   Diagnosis Date Noted   Unstable angina pectoris ([place]) 07/14/2020   Mild protein-calorie malnutrition ([place]) 07/14/2020   Mild major depression ([place]) 01/10/2020   CKD (chronic kidney disease) stage 3, GFR 30-59 ml/min (HCC) 87/56/4332   Metabolic acidosis, normal anion gap (NAG) 07/27/2018   Dementia without behavioral disturbance ([place]) 07/27/2018   Glaucoma 07/27/2018   Secondary hyperparathyroidism ([place]) 11/26/2016   Elevated prostate specific antigen (PSA) 09/08/2015   Bone pain 06/13/2015   Spinal stenosis 06/13/2015   OP (osteoporosis) 03/20/2015   Allergic rhinitis 03/20/2015   Lumbar canal stenosis 03/20/2015   Vitamin D deficiency 03/20/2015   Restless legs syndrome 03/20/2015   Drug-induced gynecomastia 03/20/2015   Diaphragmatic hernia 03/20/2015   Acquired cyst of kidney 03/20/2015   Left ventricular hypertrophy by electrocardiogram 03/20/2015   Leg pain 03/20/2015   History of pneumonia 03/20/2015   Non-rheumatic tricuspid valve insufficiency 95/18/8416   Dysmetabolic syndrome 60/63/0160   Pulmonary hypertension ([place]) 10/31/2014   Benign essential HTN 07/29/2014   Dyslipidemia 07/29/2014   PVD (peripheral vascular disease) ([place])    Anemia of chronic disease    IBS (irritable bowel syndrome)    Bradycardia    ED (erectile dysfunction) of organic origin 06/29/2012   Genuine stress incontinence, male 06/29/2012   Malignant neoplasm of prostate ([place]) 06/29/2012    Past Surgical History:  Procedure Laterality Date   BASCILIC VEIN TRANSPOSITION Left 09/05/2016   Procedure: FIRST  STAGE BASILIC VEIN TRANSPOSITION left arm;  Surgeon: Serafina Mitchell, MD;  Location[facility] OR;  Service: Vascular;  Laterality: Left;   BASCILIC VEIN TRANSPOSITION Left 11/14/2016   Procedure: BASCILIC VEIN TRANSPOSITION-LEFT ARM 2ND STAGE;  Surgeon: Serafina Mitchell, MD;  Location: [facility] OR;  Service: Vascular;  Laterality: Left;   COLONOSCOPY     EYE SURGERY Left 09/11/2017   PROSTATE SURGERY     PROSTATECTOMY      Family History  Problem Relation Age of Onset   CVA Mother    Hypertension Mother    Dementia Mother    Heart attack Father    Heart disease Father        before age 74   Hypertension Father    Hepatitis C Sister    Dementia Sister     Social History   Tobacco Use   Smoking status: Former    Packs/day: 0.30    Years: 20.00    Pack years: 6.00    Types: Cigarettes    Quit date: 08/26/1978    Years since quitting: 43.0   Smokeless tobacco: Never   Tobacco comments:    smoking cessation materials not required  Substance Use Topics   Alcohol use: No    Alcohol/week: 0.0 standard drinks     Current Outpatient Medications:    amLODipine (NORVASC) 10 MG tablet, Take 1 tablet (10 mg total) by mouth daily., Disp: 90 tablet, Rfl: 1   aspirin 81 MG tablet, Take 81 mg by mouth daily. , Disp: , Rfl:    atorvastatin (LIPITOR) 40 MG tablet, Take  1 tablet (40 mg total) by mouth every evening., Disp: 90 tablet, Rfl: 1   hydrALAZINE (APRESOLINE) 25 MG tablet, TAKE ONE TABLET BY MOUTH THREE TIMES DAILY, Disp: 270 tablet, Rfl: 0   PFIZER COVID-19 VAC BIVALENT injection, , Disp: , Rfl:    vitamin C (ASCORBIC ACID) 500 MG tablet, Take 500 mg by mouth daily., Disp: , Rfl:    Vitamin E 400 UNITS CHEW, Chew 400 Units by mouth daily. , Disp: , Rfl:   Allergies  Allergen Reactions   Ace Inhibitors Cough    cough   Aricept [Donepezil] Nausea And Vomiting    I personally reviewed active problem list, medication list, allergies, family history, social history with the patient/caregiver  today.   ROS  Ten systems reviewed and is negative except as mentioned in HPI   Objective  Vitals:   08/24/21 1103  BP: 124/60  Pulse: 80  Resp: 16  Temp: 98 F (36.7 C)  TempSrc: Oral  SpO2: 98%  Weight: 167 lb 6.4 oz (75.9 kg)  Height: 6\' 2"  (1.88 m)    Body mass index is 21.49 kg/m.  Physical Exam  Constitutional: Patient appears well-developed and malnourished   No distress.  HEENT: head atraumatic, normocephalic, pupils equal and reactive to light, neck supple Cardiovascular: Normal rate, regular rhythm and normal heart sounds.  No murmur heard. No BLE edema. Pulmonary/Chest: Effort normal and breath sounds normal. No respiratory distress. Abdominal: Soft.  There is no tenderness. Skin: soft mass , no redness or tenderness on left inner thigh, center is raised, size is 4x5 cm Psychiatric: Patient has a normal mood and affect. behavior is normal. Judgment and thought content normal.   Recent Results (from the past 2160 hour(s))  ECHOCARDIOGRAM COMPLETE     Status: None   Collection Time: 06/27/21  2:21 PM  Result Value Ref Range   Area-P 1/2 3.12 cm2   S' Lateral 3.00 cm   AV Area mean vel 2.25 cm2   AR max vel 2.47 cm2   AV Area VTI 2.41 cm2   Ao pk vel 2.24 m/s   AV Mean grad 8.8 mmHg   AV Peak grad 20.0 mmHg     PHQ2/9: Depression screen [facility] 2/9 08/24/2021 06/12/2021 01/16/2021 12/29/2020 11/13/2020  Decreased Interest 3 0 2 0 3  Down, Depressed, Hopeless 0 0 0 0 0  PHQ - 2 Score 3 0 2 0 3  Altered sleeping 0 0 0 - 0  Tired, decreased energy 0 0 1 - 3  Change in appetite 0 0 1 - 0  Feeling bad or failure about yourself  0 0 1 - 0  Trouble concentrating 0 0 2 - 0  Moving slowly or fidgety/restless 0 0 1 - 0  Suicidal thoughts 0 0 0 - 0  PHQ-9 Score 3 0 8 - 6  Difficult doing work/chores - - Somewhat difficult - -  Some recent data might be hidden    phq 9 is negative   Fall Risk: Fall Risk  08/24/2021 06/12/2021 01/16/2021 12/29/2020 11/13/2020   Falls in the past year? 0 0 0 0 0  Number falls in past yr: 0 0 0 0 0  Injury with Fall? 0 0 0 0 0  Risk for fall due to : No Fall Risks Impaired balance/gait - - -  Risk for fall due to: Comment - - - - -  Follow up Falls prevention discussed Falls prevention discussed Falls evaluation completed - -  Assessment & Plan  1. Mass of leg, left  - Ambulatory referral to General Surgery

## 2021-08-24 ENCOUNTER — Ambulatory Visit (INDEPENDENT_AMBULATORY_CARE_PROVIDER_SITE_OTHER): Payer: Medicare HMO | Admitting: Family Medicine

## 2021-08-24 ENCOUNTER — Encounter: Payer: Self-pay | Admitting: Family Medicine

## 2021-08-24 VITALS — BP 124/60 | HR 80 | Temp 98.0°F | Resp 16 | Ht 74.0 in | Wt 167.4 lb

## 2021-08-24 DIAGNOSIS — R2242 Localized swelling, mass and lump, left lower limb: Secondary | ICD-10-CM

## 2021-08-31 ENCOUNTER — Ambulatory Visit: Payer: Medicare HMO | Admitting: Surgery

## 2021-09-17 ENCOUNTER — Telehealth: Payer: Self-pay | Admitting: Family Medicine

## 2021-09-17 ENCOUNTER — Ambulatory Visit: Payer: Medicare HMO | Admitting: Surgery

## 2021-09-17 NOTE — Telephone Encounter (Signed)
Copied from [place] [id number]. Topic: General - Other >> Sep 17, 2021 12:14 PM Patrick [name] wrote: Reason for CRM: Patient wife Patrick [name] called in to inform Dr Ancil Boozer that he will be needing some help in order to be able to stay in their home.  Say that they have spoken to the insurance company and was told that Dr Ancil Boozer will have to place an order for [name] home care nurse and what ever other services are needed. Say for [name] proper explanation and clarity of what is needed please call daughter  Patrick [name] POA at Ph# [phone] Ph# [phone]

## 2021-09-18 NOTE — Telephone Encounter (Signed)
Are you willing to see this pt at 1:00 pm on any day? You are booked for several weeks.

## 2021-09-19 ENCOUNTER — Ambulatory Visit: Payer: Medicare HMO | Admitting: Family Medicine

## 2021-09-20 NOTE — Progress Notes (Signed)
Name: Patrick [name].   MRN: [id number]    DOB: [birth year]   Date:09/24/2021       Progress Note  Subjective  Chief Complaint  Referral- Home Care  HPI  Alzheimer's Dementia: He was diagnosed years ago. He is unable to select cloths to wear, shower, cannot prepare meals , he also has vision impairment due to glaucoma and cannot see numbers on microwave - therefore cannot heat up his food. Cannot self medicate. He has set off the alarm at night because he tries to go out and cannot recall alarm code. Lives with his wife, however she is having medical problems and is having difficulty helping him . She will have back surgery soon and wondering if she can have home health to assist with ADL He is unable to due instrumental activities of daily living but wife can do that for him.   He also has malnutrition and lost another 4 lbs in the past month, he does not feel hungry .    Patient Active Problem List   Diagnosis Date Noted   Unstable angina pectoris ([place]) 07/14/2020   Mild protein-calorie malnutrition ([place]) 07/14/2020   Mild major depression ([place]) 01/10/2020   CKD (chronic kidney disease) stage 3, GFR 30-59 ml/min (HCC) 62/95/2841   Metabolic acidosis, normal anion gap (NAG) 07/27/2018   Dementia without behavioral disturbance ([place]) 07/27/2018   Glaucoma 07/27/2018   Secondary hyperparathyroidism ([place]) 11/26/2016   Elevated prostate specific antigen (PSA) 09/08/2015   Bone pain 06/13/2015   Spinal stenosis 06/13/2015   OP (osteoporosis) 03/20/2015   Allergic rhinitis 03/20/2015   Lumbar canal stenosis 03/20/2015   Vitamin D deficiency 03/20/2015   Restless legs syndrome 03/20/2015   Drug-induced gynecomastia 03/20/2015   Diaphragmatic hernia 03/20/2015   Acquired cyst of kidney 03/20/2015   Left ventricular hypertrophy by electrocardiogram 03/20/2015   Leg pain 03/20/2015   History of pneumonia 03/20/2015   Non-rheumatic tricuspid valve insufficiency 32/44/0102    Dysmetabolic syndrome 72/53/6644   Pulmonary hypertension ([place]) 10/31/2014   Benign essential HTN 07/29/2014   Dyslipidemia 07/29/2014   PVD (peripheral vascular disease) ([place])    Anemia of chronic disease    IBS (irritable bowel syndrome)    Bradycardia    ED (erectile dysfunction) of organic origin 06/29/2012   Genuine stress incontinence, male 06/29/2012   Malignant neoplasm of prostate ([place]) 06/29/2012    Past Surgical History:  Procedure Laterality Date   BASCILIC VEIN TRANSPOSITION Left 09/05/2016   Procedure: FIRST STAGE BASILIC VEIN TRANSPOSITION left arm;  Surgeon: Serafina Mitchell, MD;  Location: [facility] OR;  Service: Vascular;  Laterality: Left;   BASCILIC VEIN TRANSPOSITION Left 11/14/2016   Procedure: BASCILIC VEIN TRANSPOSITION-LEFT ARM 2ND STAGE;  Surgeon: Serafina Mitchell, MD;  Location: [facility] OR;  Service: Vascular;  Laterality: Left;   COLONOSCOPY     EYE SURGERY Left 09/11/2017   PROSTATE SURGERY     PROSTATECTOMY      Family History  Problem Relation Age of Onset   CVA Mother    Hypertension Mother    Dementia Mother    Heart attack Father    Heart disease Father        before age 76   Hypertension Father    Hepatitis C Sister    Dementia Sister     Social History   Tobacco Use   Smoking status: Former    Packs/day: 0.30    Years: 20.00    Pack years: 6.00  Types: Cigarettes    Quit date: 08/26/1978    Years since quitting: 43.1   Smokeless tobacco: Never   Tobacco comments:    smoking cessation materials not required  Substance Use Topics   Alcohol use: No    Alcohol/week: 0.0 standard drinks     Current Outpatient Medications:    amLODipine (NORVASC) 10 MG tablet, Take 1 tablet (10 mg total) by mouth daily., Disp: 90 tablet, Rfl: 1   aspirin 81 MG tablet, Take 81 mg by mouth daily. , Disp: , Rfl:    atorvastatin (LIPITOR) 40 MG tablet, Take 1 tablet (40 mg total) by mouth every evening., Disp: 90 tablet, Rfl: 1   hydrALAZINE (APRESOLINE) 25 MG  tablet, TAKE ONE TABLET BY MOUTH THREE TIMES DAILY, Disp: 270 tablet, Rfl: 0   vitamin C (ASCORBIC ACID) 500 MG tablet, Take 500 mg by mouth daily., Disp: , Rfl:    Vitamin E 400 UNITS CHEW, Chew 400 Units by mouth daily. , Disp: , Rfl:   Allergies  Allergen Reactions   Ace Inhibitors Cough    cough   Aricept [Donepezil] Nausea And Vomiting    I personally reviewed active problem list, medication list, allergies, family history, social history, health maintenance with the patient/caregiver today.   ROS  Ten systems reviewed and is negative except as mentioned in HPI   Objective  Vitals:   09/24/21 1315 09/24/21 1322  BP: (!) 142/68 134/66  Pulse: 80   Resp: 16   Temp: 98.1 F (36.7 C)   SpO2: 99%   Weight: 163 lb (73.9 kg)   Height: 6\' 2"  (1.88 m)     Body mass index is 20.93 kg/m.  Physical Exam  Constitutional: Patient appears well-developed and malnutrition No distress.  HEENT: head atraumatic, normocephalic, pupils equal and reactive to light, neck supple, throat within normal limits Cardiovascular: Normal rate, regular rhythm and normal heart sounds.  No murmur heard. 1 plus  BLE edema. Pulmonary/Chest: Effort normal and breath sounds normal. No respiratory distress. Abdominal: Soft.  There is no tenderness. Psychiatric: Calm and cooperative, he is here with niece and wife. He is aware of his name, address, date of birth. Could not recall where he was currently, date or time, but knew it was the afternoon.   Recent Results (from the past 2160 hour(s))  ECHOCARDIOGRAM COMPLETE     Status: None   Collection Time: 06/27/21  2:21 PM  Result Value Ref Range   Area-P 1/2 3.12 cm2   S' Lateral 3.00 cm   AV Area mean vel 2.25 cm2   AR max vel 2.47 cm2   AV Area VTI 2.41 cm2   Ao pk vel 2.24 m/s   AV Mean grad 8.8 mmHg   AV Peak grad 20.0 mmHg    PHQ2/9: Depression screen [facility] 2/9 09/24/2021 08/24/2021 06/12/2021 01/16/2021 12/29/2020  Decreased Interest 0 3 0 2 0   Down, Depressed, Hopeless 0 0 0 0 0  PHQ - 2 Score 0 3 0 2 0  Altered sleeping 0 0 0 0 -  Tired, decreased energy 0 0 0 1 -  Change in appetite 0 0 0 1 -  Feeling bad or failure about yourself  0 0 0 1 -  Trouble concentrating 0 0 0 2 -  Moving slowly or fidgety/restless 0 0 0 1 -  Suicidal thoughts 0 0 0 0 -  PHQ-9 Score 0 3 0 8 -  Difficult doing work/chores - - - Somewhat difficult -  Some recent data might be hidden    phq 9 is negative   Fall Risk: Fall Risk  09/24/2021 08/24/2021 06/12/2021 01/16/2021 12/29/2020  Falls in the past year? 0 0 0 0 0  Number falls in past yr: 0 0 0 0 0  Injury with Fall? 0 0 0 0 0  Risk for fall due to : No Fall Risks No Fall Risks Impaired balance/gait - -  Risk for fall due to: Comment - - - - -  Follow up Falls prevention discussed Falls prevention discussed Falls prevention discussed Falls evaluation completed -      Functional Status Survey: Is the patient deaf or have difficulty hearing?: No Does the patient have difficulty seeing, even when wearing glasses/contacts?: No Does the patient have difficulty concentrating, remembering, or making decisions?: Yes Does the patient have difficulty walking or climbing stairs?: Yes Does the patient have difficulty dressing or bathing?: Yes Does the patient have difficulty doing errands alone such as visiting a doctor's office or shopping?: Yes    Assessment & Plan   1. Dementia without behavioral disturbance ([place])  - Ambulatory referral to Home Health - Amb Referral to Palliative Care  He cannot tolerate any medications in the past. Discussed neurologist again and palliative care and they are interested in that also    2. Mild protein-calorie malnutrition ([place])  - Amb Referral to Palliative Care

## 2021-09-24 ENCOUNTER — Encounter: Payer: Self-pay | Admitting: Family Medicine

## 2021-09-24 ENCOUNTER — Ambulatory Visit (INDEPENDENT_AMBULATORY_CARE_PROVIDER_SITE_OTHER): Payer: Medicare HMO | Admitting: Family Medicine

## 2021-09-24 ENCOUNTER — Other Ambulatory Visit: Payer: Self-pay

## 2021-09-24 VITALS — BP 134/66 | HR 80 | Temp 98.1°F | Resp 16 | Ht 74.0 in | Wt 163.0 lb

## 2021-09-24 DIAGNOSIS — E441 Mild protein-calorie malnutrition: Secondary | ICD-10-CM | POA: Diagnosis not present

## 2021-09-24 DIAGNOSIS — F0390 Unspecified dementia without behavioral disturbance: Secondary | ICD-10-CM

## 2021-09-26 ENCOUNTER — Telehealth: Payer: Self-pay

## 2021-09-26 NOTE — Telephone Encounter (Signed)
Spoke with patient's wife Altha Harm and scheduled an in-person Palliative Consult for 10/04/21 @ 10AM  COVID screening was negative. No pets in home. Patient lives with wife.   Consent obtained; updated Outlook/Netsmart/Team List and Epic.   Family is aware they may be receiving a call from provider the day before or day of to confirm appointment.

## 2021-09-26 NOTE — Telephone Encounter (Signed)
Attempted to contact patient's wife Altha Harm to schedule a Palliative Care consult appointment. No answer left a message to return call.

## 2021-10-03 ENCOUNTER — Ambulatory Visit: Payer: Medicare HMO | Admitting: Surgery

## 2021-10-04 ENCOUNTER — Ambulatory Visit (INDEPENDENT_AMBULATORY_CARE_PROVIDER_SITE_OTHER): Payer: Medicare HMO

## 2021-10-04 ENCOUNTER — Other Ambulatory Visit: Payer: Self-pay | Admitting: Hospice

## 2021-10-04 ENCOUNTER — Other Ambulatory Visit: Payer: Self-pay | Admitting: Cardiology

## 2021-10-04 ENCOUNTER — Other Ambulatory Visit: Payer: Self-pay

## 2021-10-04 DIAGNOSIS — F0390 Unspecified dementia without behavioral disturbance: Secondary | ICD-10-CM | POA: Diagnosis not present

## 2021-10-04 DIAGNOSIS — R269 Unspecified abnormalities of gait and mobility: Secondary | ICD-10-CM

## 2021-10-04 DIAGNOSIS — E440 Moderate protein-calorie malnutrition: Secondary | ICD-10-CM | POA: Diagnosis not present

## 2021-10-04 DIAGNOSIS — Z0000 Encounter for general adult medical examination without abnormal findings: Secondary | ICD-10-CM | POA: Diagnosis not present

## 2021-10-04 DIAGNOSIS — Z515 Encounter for palliative care: Secondary | ICD-10-CM | POA: Diagnosis not present

## 2021-10-04 NOTE — Patient Instructions (Signed)
[name] , Thank you for taking time to come for your Medicare Wellness Visit. I appreciate your ongoing commitment to your health goals. Please review the following plan we discussed and let me know if I can assist you in the future.   Screening recommendations/referrals: Colonoscopy: no longer required Recommended yearly ophthalmology/optometry visit for glaucoma screening and checkup Recommended yearly dental visit for hygiene and checkup  Vaccinations: Influenza vaccine: done 06/12/21 Pneumococcal vaccine: done 08/26/16 Tdap vaccine: due Shingles vaccine: need record Covid-19: done 10/16/19, 11/06/19, 05/27/20, 01/31/21 & 06/30/21  Advanced directives: Please bring a copy of your health care power of attorney and living will to the office at your convenience.   Conditions/risks identified: Recommend continuing fall prevention in the home  Next appointment: Follow up in one year for your annual wellness visit.   Preventive Care 52 Years and Older, Male Preventive care refers to lifestyle choices and visits with your health care provider that can promote health and wellness. What does preventive care include? A yearly physical exam. This is also called an annual well check. Dental exams once or twice a year. Routine eye exams. Ask your health care provider how often you should have your eyes checked. Personal lifestyle choices, including: Daily care of your teeth and gums. Regular physical activity. Eating a healthy diet. Avoiding tobacco and drug use. Limiting alcohol use. Practicing safe sex. Taking low doses of aspirin every day. Taking vitamin and mineral supplements as recommended by your health care provider. What happens during an annual well check? The services and screenings done by your health care provider during your annual well check will depend on your age, overall health, lifestyle risk factors, and family history of disease. Counseling  Your health care provider  may ask you questions about your: Alcohol use. Tobacco use. Drug use. Emotional well-being. Home and relationship well-being. Sexual activity. Eating habits. History of falls. Memory and ability to understand (cognition). Work and work Statistician. Screening  You may have the following tests or measurements: Height, weight, and BMI. Blood pressure. Lipid and cholesterol levels. These may be checked every 5 years, or more frequently if you are over 22 years old. Skin check. Lung cancer screening. You may have this screening every year starting at age 86 if you have a 30-pack-year history of smoking and currently smoke or have quit within the past 15 years. Fecal occult blood test (FOBT) of the stool. You may have this test every year starting at age 86. Flexible sigmoidoscopy or colonoscopy. You may have a sigmoidoscopy every 5 years or a colonoscopy every 10 years starting at age 86. Prostate cancer screening. Recommendations will vary depending on your family history and other risks. Hepatitis C blood test. Hepatitis B blood test. Sexually transmitted disease (STD) testing. Diabetes screening. This is done by checking your blood sugar (glucose) after you have not eaten for a while (fasting). You may have this done every 1-3 years. Abdominal aortic aneurysm (AAA) screening. You may need this if you are a current or former smoker. Osteoporosis. You may be screened starting at age 86 if you are at high risk. Talk with your health care provider about your test results, treatment options, and if necessary, the need for more tests. Vaccines  Your health care provider may recommend certain vaccines, such as: Influenza vaccine. This is recommended every year. Tetanus, diphtheria, and acellular pertussis (Tdap, Td) vaccine. You may need a Td booster every 10 years. Zoster vaccine. You may need this after age 86.  Pneumococcal 13-valent conjugate (PCV13) vaccine. One dose is recommended after  age 86. Pneumococcal polysaccharide (PPSV23) vaccine. One dose is recommended after age 86. Talk to your health care provider about which screenings and vaccines you need and how often you need them. This information is not intended to replace advice given to you by your health care provider. Make sure you discuss any questions you have with your health care provider. Document Released: 09/22/2015 Document Revised: 05/15/2016 Document Reviewed: 06/27/2015 Elsevier Interactive Patient Education  2017 [place] Prevention in the Home Falls can cause injuries. They can happen to people of all ages. There are many things you can do to make your home safe and to help prevent falls. What can I do on the outside of my home? Regularly fix the edges of walkways and driveways and fix any cracks. Remove anything that might make you trip as you walk through a door, such as a raised step or threshold. Trim any bushes or trees on the path to your home. Use bright outdoor lighting. Clear any walking paths of anything that might make someone trip, such as rocks or tools. Regularly check to see if handrails are loose or broken. Make sure that both sides of any steps have handrails. Any raised decks and porches should have guardrails on the edges. Have any leaves, snow, or ice cleared regularly. Use sand or salt on walking paths during winter. Clean up any spills in your garage right away. This includes oil or grease spills. What can I do in the bathroom? Use night lights. Install grab bars by the toilet and in the tub and shower. Do not use towel bars as grab bars. Use non-skid mats or decals in the tub or shower. If you need to sit down in the shower, use a plastic, non-slip stool. Keep the floor dry. Clean up any water that spills on the floor as soon as it happens. Remove soap buildup in the tub or shower regularly. Attach bath mats securely with double-sided non-slip rug tape. Do not have  throw rugs and other things on the floor that can make you trip. What can I do in the bedroom? Use night lights. Make sure that you have a light by your bed that is easy to reach. Do not use any sheets or blankets that are too big for your bed. They should not hang down onto the floor. Have a firm chair that has side arms. You can use this for support while you get dressed. Do not have throw rugs and other things on the floor that can make you trip. What can I do in the kitchen? Clean up any spills right away. Avoid walking on wet floors. Keep items that you use a lot in easy-to-reach places. If you need to reach something above you, use a strong step stool that has a grab bar. Keep electrical cords out of the way. Do not use floor polish or wax that makes floors slippery. If you must use wax, use non-skid floor wax. Do not have throw rugs and other things on the floor that can make you trip. What can I do with my stairs? Do not leave any items on the stairs. Make sure that there are handrails on both sides of the stairs and use them. Fix handrails that are broken or loose. Make sure that handrails are as long as the stairways. Check any carpeting to make sure that it is firmly attached to the stairs. Fix any  carpet that is loose or worn. Avoid having throw rugs at the top or bottom of the stairs. If you do have throw rugs, attach them to the floor with carpet tape. Make sure that you have a light switch at the top of the stairs and the bottom of the stairs. If you do not have them, ask someone to add them for you. What else can I do to help prevent falls? Wear shoes that: Do not have high heels. Have rubber bottoms. Are comfortable and fit you well. Are closed at the toe. Do not wear sandals. If you use a stepladder: Make sure that it is fully opened. Do not climb a closed stepladder. Make sure that both sides of the stepladder are locked into place. Ask someone to hold it for you, if  possible. Clearly mark and make sure that you can see: Any grab bars or handrails. First and last steps. Where the edge of each step is. Use tools that help you move around (mobility aids) if they are needed. These include: Canes. Walkers. Scooters. Crutches. Turn on the lights when you go into a dark area. Replace any light bulbs as soon as they burn out. Set up your furniture so you have a clear path. Avoid moving your furniture around. If any of your floors are uneven, fix them. If there are any pets around you, be aware of where they are. Review your medicines with your doctor. Some medicines can make you feel dizzy. This can increase your chance of falling. Ask your doctor what other things that you can do to help prevent falls. This information is not intended to replace advice given to you by your health care provider. Make sure you discuss any questions you have with your health care provider. Document Released: 06/22/2009 Document Revised: 02/01/2016 Document Reviewed: 09/30/2014 Elsevier Interactive Patient Education  2017 [employer].

## 2021-10-04 NOTE — Progress Notes (Signed)
[facility] Consult Note Telephone: [phone]  Fax: [phone]  PATIENT NAME: Patrick [name] [address] [place] [address] [phone] (home)  DOB: 02/16/1932 MRN: [id number]  PRIMARY CARE PROVIDER:    Steele Sizer, MD,  [address] [place] [address] [phone]  REFERRING PROVIDER:   Steele Sizer, MD [address] [place],  [place] [address] [phone]  RESPONSIBLE PARTY:  Spouse Altha Harm [phone] Cell best for virtual visit Contact Information     Name Relation Home Work [place] Spouse [phone]  [phone]      I met face to face with patient and family at home. Palliative Care was asked to follow this patient by consultation request of  Steele Sizer, MD to address advance care planning, complex medical decision making and goals of care clarification. This is the initial visit.    ASSESSMENT AND / RECOMMENDATIONS:   Advance Care Planning: Our advance care planning conversation included a discussion about:    The value and importance of advance care planning  Difference between Hospice and Palliative care Exploration of goals of care in the event of a sudden injury or illness  Identification and preparation of a healthcare agent  Review and updating or creation of an  advance directive document . Decision not to resuscitate or to de-escalate disease focused treatments due to poor prognosis.  CODE STATUS: Ramifications and implications of CODE STATUS discussed.  Patient and spouse elects DO NOT RESUSCITATE.  NP signed DNR for patient to keep at home.  Goals of Care: Goals include to maximize quality of life and symptom management  I spent  20 minutes providing this initial consultation. More than 50% of the time in this consultation was spent on counseling patient and coordinating  communication. --------------------------------------------------------------------------------------------------------------------------------------  Symptom Management/Plan: Dementia: Ongoing memory loss/confusion. Impoverished thoughts, impaired judgement, word-finding difficulty. Encourage reminiscence, word search/puzzles, cueing for recollection.  Promote calm approach and engaging environment.  Optimize ongoing supportive care. Patient is ambulatory without assistive device. Education provided on using rolling walker/cane to help provide support and prevent fall Protein caloric malnutrition: Provide enough time and assistance during meals. Offer 4-6 small meals. Take Ensure daily.  ST consult as needed. Gait disturbance: Use assistive device for support.  Fall precautions discussed.  Balance of safety and performance activity. Follow up: Palliative care will continue to follow for complex medical decision making, advance care planning, and clarification of goals. Return 6 weeks or prn. Encouraged to call provider sooner with any concerns.   Family /Caregiver/Community Supports: Patient lives at home with spouse.  HOSPICE ELIGIBILITY/DIAGNOSIS: TBD  Chief Complaint: Initial Palliative care visit  HISTORY OF PRESENT ILLNESS:  Patrick [name]. is a 88 y.o. year old male  with multiple medical conditions including Dementia; Memory loss related to Dementia is chronic, worsening in line with dementia disease trajectory; this affects patient's quality of life and independence, memory loss is worse as the day progresses; cues for recollection sometimes helpful. Patient denies pain/discomfort. History of prostate CA, gait disturbance, HTN.  History obtained from review of EMR, discussion with primary team, caregiver, family and/or [name].  Review and summarization of Epic records shows history from other than patient. Rest of 10 point ROS asked and negative.  I reviewed as needed, available  labs, patient records, imaging, studies and related documents from the EMR.   ROS General: NAD EYES: denies vision changes, followed by ophthalmologist ENMT:  denies dysphagia Cardiovascular: denies chest pain/discomfort Pulmonary: denies cough, denies SOB Abdomen: endorses good appetite, denies constipation/diarrhea GU: denies dysuria, urinary frequency MSK:  endorses weakness,  no falls reported Skin: denies rashes or wounds Neurological: denies pain, denies insomnia Psych: Endorses positive mood Heme/lymph/immuno: denies bruises, abnormal bleeding  Physical Exam: Height/Weight: 6 feet 2 inches/163 Ibs down from 59 Ibs a year ago Constitutional: NAD General: Well groomed, cooperative EYES: anicteric sclera, lids intact, no discharge  ENMT: Moist mucous membrane CV: S1 S2, RRR, no LE edema Pulmonary: LCTA, no increased work of breathing, no cough, Abdomen: active BS + 4 quadrants, soft and non tender GU: no suprapubic tenderness MSK: weakness, sarcopenia, ambulatory without assistive device Skin: warm and dry, no rashes or wounds on visible skin Neuro:  weakness, otherwise non focal, memory loss Psych: non-anxious affect Hem/lymph/immuno: no widespread bruising   PAST MEDICAL HISTORY:  Active Ambulatory Problems    Diagnosis Date Noted   ED (erectile dysfunction) of organic origin 06/29/2012   Genuine stress incontinence, male 06/29/2012   PVD (peripheral vascular disease) ([facility])    Anemia of chronic disease    IBS (irritable bowel syndrome)    Bradycardia    Benign essential HTN 07/29/2014   Dyslipidemia 07/29/2014   Pulmonary hypertension ([facility]) 10/31/2014   OP (osteoporosis) 03/20/2015   Allergic rhinitis 03/20/2015   Lumbar canal stenosis 03/20/2015   Vitamin D deficiency 03/20/2015   Restless legs syndrome 03/20/2015   Drug-induced gynecomastia 03/20/2015   Diaphragmatic hernia 03/20/2015   Acquired cyst of kidney 03/20/2015   Left ventricular hypertrophy by  electrocardiogram 03/20/2015   Leg pain 03/20/2015   History of pneumonia 03/20/2015   Non-rheumatic tricuspid valve insufficiency 53/61/4431   Dysmetabolic syndrome 54/00/8676   Bone pain 06/13/2015   Malignant neoplasm of prostate ([facility]) 06/29/2012   Spinal stenosis 06/13/2015   Elevated prostate specific antigen (PSA) 09/08/2015   Secondary hyperparathyroidism ([facility]) 19/50/9326   Metabolic acidosis, normal anion gap (NAG) 07/27/2018   Dementia without behavioral disturbance ([facility]) 07/27/2018   Glaucoma 07/27/2018   CKD (chronic kidney disease) stage 3, GFR 30-59 ml/min (HCC) 04/05/2019   Mild major depression ([facility]) 01/10/2020   Unstable angina pectoris ([facility]) 07/14/2020   Mild protein-calorie malnutrition ([place]) 07/14/2020   Resolved Ambulatory Problems    Diagnosis Date Noted   GERD (gastroesophageal reflux disease)    Chronic kidney disease, stage IV (severe) ([place]) 03/20/2015   Claudication ([facility]) 06/07/2015   Mild cognitive impairment 01/03/2016   Syncope 07/27/2018   Past Medical History:  Diagnosis Date   Anemia    Arthritis    CKD (chronic kidney disease), stage IV (HCC)    High cholesterol    Hypertension    Pneumonia    Prostate cancer (HCC)    Pulmonary HTN ([facility]) 11/05/2015    SOCIAL HX:  Social History   Tobacco Use   Smoking status: Former    Packs/day: 0.30    Years: 20.00    Pack years: 6.00    Types: Cigarettes    Quit date: 08/26/1978    Years since quitting: 43.1   Smokeless tobacco: Never   Tobacco comments:    smoking cessation materials not required  Substance Use Topics   Alcohol use: No    Alcohol/week: 0.0 standard drinks     FAMILY HX:  Family History  Problem Relation Age of Onset   CVA Mother    Hypertension Mother    Dementia Mother    Heart attack Father    Heart  disease Father        before age 58   Hypertension Father    Hepatitis C Sister    Dementia Sister       ALLERGIES:  Allergies  Allergen Reactions   Ace  Inhibitors Cough    cough   Aricept [Donepezil] Nausea And Vomiting      PERTINENT MEDICATIONS:  Outpatient Encounter Medications as of 10/04/2021  Medication Sig   amLODipine (NORVASC) 10 MG tablet Take 1 tablet (10 mg total) by mouth daily.   aspirin 81 MG tablet Take 81 mg by mouth daily.    atorvastatin (LIPITOR) 40 MG tablet Take 1 tablet (40 mg total) by mouth every evening.   hydrALAZINE (APRESOLINE) 25 MG tablet TAKE ONE TABLET BY MOUTH THREE TIMES DAILY   vitamin C (ASCORBIC ACID) 500 MG tablet Take 500 mg by mouth daily.   Vitamin E 400 UNITS CHEW Chew 400 Units by mouth daily.    No facility-administered encounter medications on file as of 10/04/2021.     Thank you for the opportunity to participate in the care of [name].  The palliative care team will continue to follow. Please call our office at [phone] if we can be of additional assistance.   Note: Portions of this note were generated with L[employer]. Dictation errors may occur despite best attempts at proofreading.  Teodoro Spray, NP

## 2021-10-04 NOTE — Progress Notes (Signed)
Subjective:   Patrick [name]. is a 88 y.o. male who presents for Medicare Annual/Subsequent preventive examination.  Virtual Visit via Telephone Note  I connected with  [name]. on 10/04/21 at  3:30 PM EST by telephone and verified that I am speaking with the correct person using two identifiers.  Location: Patient: home Provider: [facility] Persons participating in the virtual visit: [place]   I discussed the limitations, risks, security and privacy concerns of performing an evaluation and management service by telephone and the availability of in person appointments. The patient expressed understanding and agreed to proceed.  Interactive audio and video telecommunications were attempted between this nurse and patient, however failed, due to patient having technical difficulties OR patient did not have access to video capability.  We continued and completed visit with audio only.  Some vital signs may be absent or patient reported.   Clemetine Marker, LPN   Review of Systems     Cardiac Risk Factors include: advanced age (>22men, >79 women);hypertension;dyslipidemia;male gender     Objective:    There were no vitals filed for this visit. There is no height or weight on file to calculate BMI.  Advanced Directives 10/04/2021 09/21/2020 04/13/2019 07/26/2018 04/09/2018 03/28/2017 01/13/2017  Does Patient Have a Medical Advance Directive? Yes Yes Yes No Yes Yes Yes  Type of Paramedic of [place];Living will [place];Living will [place];Living will - [place];Living will [place];Living will [place];Living will  Does patient want to make changes to medical advance directive? - - No - Patient declined - - - -  Copy of [place] in Chart? No - copy requested No - copy requested No - copy requested - No - copy requested -  No - copy requested  Would patient like information on creating a medical advance directive? - - - No - Patient declined - - -    Current Medications (verified) Outpatient Encounter Medications as of 10/04/2021  Medication Sig   amLODipine (NORVASC) 10 MG tablet Take 1 tablet (10 mg total) by mouth daily.   aspirin 81 MG tablet Take 81 mg by mouth daily.    atorvastatin (LIPITOR) 40 MG tablet Take 1 tablet (40 mg total) by mouth every evening.   hydrALAZINE (APRESOLINE) 25 MG tablet TAKE ONE TABLET BY MOUTH THREE TIMES DAILY   vitamin C (ASCORBIC ACID) 500 MG tablet Take 500 mg by mouth daily.   Vitamin E 400 UNITS CHEW Chew 400 Units by mouth daily.    No facility-administered encounter medications on file as of 10/04/2021.    Allergies (verified) Ace inhibitors and Aricept [donepezil]   History: Past Medical History:  Diagnosis Date   Anemia    Arthritis    Bradycardia    CKD (chronic kidney disease), stage IV (HCC)    GERD (gastroesophageal reflux disease)    High cholesterol    Hypertension    IBS (irritable bowel syndrome)    Pneumonia    Prostate cancer ([facility])    Pulmonary HTN ([facility]) 11/05/2015   4mmHg on echo 2017   PVD (peripheral vascular disease) ([facility])    Syncope 07/2018   Past Surgical History:  Procedure Laterality Date   BASCILIC VEIN TRANSPOSITION Left 09/05/2016   Procedure: FIRST STAGE BASILIC VEIN TRANSPOSITION left arm;  Surgeon: Serafina Mitchell, MD;  Location: [facility];  Service: Vascular;  Laterality: Left;  BASCILIC VEIN TRANSPOSITION Left 11/14/2016   Procedure: BASCILIC VEIN TRANSPOSITION-LEFT ARM 2ND STAGE;  Surgeon: Serafina Mitchell, MD;  Location: [facility];  Service: Vascular;  Laterality: Left;   COLONOSCOPY     EYE SURGERY Left 09/11/2017   PROSTATE SURGERY     PROSTATECTOMY     Family History  Problem Relation Age of Onset   CVA Mother    Hypertension Mother    Dementia Mother    Heart attack Father    Heart disease Father        before age 16    Hypertension Father    Hepatitis C Sister    Dementia Sister    Social History   Socioeconomic History   Marital status: Married    Spouse name: Altha Harm   Number of children: 1   Years of education: Not on file   Highest education level: 6th grade  Occupational History   Occupation: Retired   Tobacco Use   Smoking status: Former    Packs/day: 0.30    Years: 20.00    Pack years: 6.00    Types: Cigarettes    Quit date: 08/26/1978    Years since quitting: 43.1   Smokeless tobacco: Never   Tobacco comments:    smoking cessation materials not required  Vaping Use   Vaping Use: Never used  Substance and Sexual Activity   Alcohol use: No    Alcohol/week: 0.0 standard drinks   Drug use: No   Sexual activity: Not Currently  Other Topics Concern   Not on file  Social History Narrative   Lives w/ wife   Caffeine use: none   Right handed    He retired 07/2018 because of medical problems   Social Determinants of Health   Financial Resource Strain: Low Risk    Difficulty of Paying Living Expenses: Not hard at all  Food Insecurity: No Food Insecurity   Worried About Charity fundraiser in the Last Year: Never true   Arboriculturist in the Last Year: Never true  Transportation Needs: No Transportation Needs   Lack of Transportation (Medical): No   Lack of Transportation (Non-Medical): No  Physical Activity: Inactive   Days of Exercise per Week: 0 days   Minutes of Exercise per Session: 0 min  Stress: No Stress Concern Present   Feeling of Stress : Not at all  Social Connections: Moderately Integrated   Frequency of Communication with Friends and Family: More than three times a week   Frequency of Social Gatherings with Friends and Family: Never   Attends Religious Services: More than 4 times per year   Active Member of [employer] or Organizations: No   Attends Music therapist: Never   Marital Status: Married    Tobacco Counseling Counseling given: Not  Answered Tobacco comments: smoking cessation materials not required   Clinical Intake:  Pre-visit preparation completed: Yes  Pain : No/denies pain     Nutritional Risks: None Diabetes: No  How often do you need to have someone help you when you read instructions, pamphlets, or other written materials from your doctor or pharmacy?: 1 - Never    Interpreter Needed?: No  Information entered by :: Clemetine Marker LPN   Activities of Daily Living In your present state of health, do you have any difficulty performing the following activities: 10/04/2021 09/24/2021  Hearing? N N  Vision? N N  Difficulty concentrating or making decisions? Tempie Donning  Walking or climbing stairs?  Y Y  Dressing or bathing? Y Y  Doing errands, shopping? Tempie Donning  Preparing Food and eating ? N -  Using the Toilet? N -  In the past six months, have you accidently leaked urine? N -  Do you have problems with loss of bowel control? N -  Managing your Medications? Y -  Managing your Finances? Y -  Housekeeping or managing your Housekeeping? N -  Some recent data might be hidden    Patient Care Team: Steele Sizer, MD as PCP - General (Family Medicine) Edrick Oh, MD as Consulting Physician (Nephrology) Myrlene Broker, MD as Attending Physician (Urology) Sueanne Margarita, MD as Consulting Physician (Cardiology) [facility] (Ophthalmology)  Indicate any recent Medical Services you may have received from other than [facility] providers in the past year (date may be approximate).     Assessment:   This is a routine wellness examination for [facility].  Hearing/Vision screen Hearing Screening - Comments:: Pt denies hearing difficulty Vision Screening - Comments:: Vision screenings done by Dr. Alanda Slim at [facility] issues and exercise activities discussed: Current Exercise Habits: The patient does not participate in regular exercise at present, Exercise limited by: orthopedic  condition(s)   Goals Addressed             This Visit's Progress    DIET - INCREASE WATER INTAKE   Not on track    Recommend to drink at least 6-8 8oz glasses of water per day.       Depression Screen PHQ 2/9 Scores 10/04/2021 09/24/2021 08/24/2021 06/12/2021 01/16/2021 12/29/2020 11/13/2020  PHQ - 2 Score 0 0 3 0 2 0 3  PHQ- 9 Score - 0 3 0 8 - 6    Fall Risk Fall Risk  10/04/2021 09/24/2021 08/24/2021 06/12/2021 01/16/2021  Falls in the past year? 0 0 0 0 0  Number falls in past yr: 0 0 0 0 0  Injury with Fall? 0 0 0 0 0  Risk for fall due to : No Fall Risks No Fall Risks No Fall Risks Impaired balance/gait -  Risk for fall due to: Comment - - - - -  Follow up Falls prevention discussed Falls prevention discussed Falls prevention discussed Falls prevention discussed Falls evaluation completed    FALL RISK PREVENTION PERTAINING TO THE HOME:  Any stairs in or around the home? Yes  If so, are there any without handrails? No  Home free of loose throw rugs in walkways, pet beds, electrical cords, etc? Yes  Adequate lighting in your home to reduce risk of falls? Yes   ASSISTIVE DEVICES UTILIZED TO PREVENT FALLS:  Life alert? No  Use of a cane, walker or w/c? No  Grab bars in the bathroom? Yes  Shower chair or bench in shower? Yes  Elevated toilet seat or a handicapped toilet? Yes   TIMED UP AND GO:  Was the test performed? No . Telephonic visit  Cognitive Function: Cognitive status assessed by direct observation. Patient has current diagnosis of cognitive impairment. Patient is unable to complete screening 6CIT or MMSE.     MMSE - Mini Mental State Exam 01/01/2018 07/07/2017  Orientation to time 1 0  Orientation to Place 4 5  Registration 3 0  Attention/ Calculation 0 0  Recall 0 0  Language- name 2 objects 2 2  Language- repeat 1 1  Language- follow 3 step command 3 3  Language- read & follow direction 1 1  Write a  sentence 1 1  Copy design 1 0  Total score 17 13      6CIT Screen 09/21/2020 04/13/2019 04/09/2018  What Year? 0 points 0 points 4 points  What month? 3 points 0 points 3 points  What time? 0 points 0 points 3 points  Count back from 20 0 points 0 points 0 points  Months in reverse 4 points 4 points 0 points  Repeat phrase 10 points 8 points 10 points  Total Score 17 12 20     Immunizations Immunization History  Administered Date(s) Administered   Fluad Quad(high Dose 65+) 06/09/2019, 06/12/2020, 06/12/2021   Influenza, High Dose Seasonal PF 06/13/2015, 08/26/2016, 07/07/2017, 06/08/2018, 06/09/2019, 06/12/2020   PFIZER(Purple Top)SARS-COV-2 Vaccination 10/16/2019, 11/06/2019, 05/27/2020, 01/31/2021, 06/30/2021   Pneumococcal Conjugate-13 03/23/2015   Pneumococcal Polysaccharide-23 08/26/2016    TDAP status: Due, Education has been provided regarding the importance of this vaccine. Advised may receive this vaccine at local pharmacy or Health Dept. Aware to provide a copy of the vaccination record if obtained from local pharmacy or Health Dept. Verbalized acceptance and understanding.  Flu Vaccine status: Up to date  Pneumococcal vaccine status: Up to date  Covid-19 vaccine status: Completed vaccines  Qualifies for Shingles Vaccine? Yes   Zostavax completed No   Shingrix Completed?: Yes - completed per patient; need records  Screening Tests Health Maintenance  Topic Date Due   TETANUS/TDAP  Never done   Zoster Vaccines- Shingrix (1 of 2) Never done   COVID-19 Vaccine (6 - Booster for Pfizer series) 08/25/2021   Pneumonia Vaccine 36+ Years old  Completed   INFLUENZA VACCINE  Completed   HPV VACCINES  Aged Out    Health Maintenance  Health Maintenance Due  Topic Date Due   TETANUS/TDAP  Never done   Zoster Vaccines- Shingrix (1 of 2) Never done   COVID-19 Vaccine (6 - Booster for Pfizer series) 08/25/2021    Colorectal cancer screening: No longer required.   Lung Cancer Screening: (Low Dose CT Chest recommended if Age  39-80 years, 30 pack-year currently smoking OR have quit w/in 15years.) does not qualify.    Additional Screening:  Hepatitis C Screening: does not qualify  Vision Screening: Recommended annual ophthalmology exams for early detection of glaucoma and other disorders of the eye. Is the patient up to date with their annual eye exam?  Yes  Who is the provider or what is the name of the office in which the patient attends annual eye exams? Dr. Alanda Slim.   Dental Screening: Recommended annual dental exams for proper oral hygiene  Community Resource Referral / Chronic Care Management: CRR required this visit?  No   CCM required this visit?  No      Plan:     I have personally reviewed and noted the following in the patients chart:   Medical and social history Use of alcohol, tobacco or illicit drugs  Current medications and supplements including opioid prescriptions. Patient is not currently taking opioid prescriptions. Functional ability and status Nutritional status Physical activity Advanced directives List of other physicians Hospitalizations, surgeries, and ER visits in previous 12 months Vitals Screenings to include cognitive, depression, and falls Referrals and appointments  In addition, I have reviewed and discussed with patient certain preventive protocols, quality metrics, and best practice recommendations. A written personalized care plan for preventive services as well as general preventive health recommendations were provided to patient.   Due to this being a telephonic visit, the after visit summary with patients personalized  plan was offered to patient via mail or my-chart. Patient declined at this time.  Clemetine Marker, LPN   3/34/3568   Nurse Notes: none

## 2021-10-12 ENCOUNTER — Ambulatory Visit: Payer: Medicare HMO | Admitting: Family Medicine

## 2021-10-19 ENCOUNTER — Ambulatory Visit: Payer: Medicare HMO | Admitting: Family Medicine

## 2021-11-01 ENCOUNTER — Other Ambulatory Visit: Payer: Self-pay

## 2021-11-01 ENCOUNTER — Other Ambulatory Visit: Payer: Self-pay | Admitting: Hospice

## 2021-11-01 DIAGNOSIS — R269 Unspecified abnormalities of gait and mobility: Secondary | ICD-10-CM

## 2021-11-01 DIAGNOSIS — F0390 Unspecified dementia without behavioral disturbance: Secondary | ICD-10-CM

## 2021-11-01 DIAGNOSIS — E440 Moderate protein-calorie malnutrition: Secondary | ICD-10-CM

## 2021-11-01 DIAGNOSIS — Z515 Encounter for palliative care: Secondary | ICD-10-CM

## 2021-11-01 NOTE — Progress Notes (Signed)
[facility] Consult Note Telephone: [phone]  Fax: [phone]  PATIENT NAME: Patrick [name] [address] [place] [address] [phone] (home)  DOB: 07/05/1932 MRN: [id number]  PRIMARY CARE PROVIDER:    Steele Sizer, MD,  [address] [place] [address] [phone]  REFERRING PROVIDER:   Steele Sizer, MD [address] [place],  [place] [address] [phone]  RESPONSIBLE PARTY:  Spouse Patrick [name] [phone] Cell best for virtual visit Contact Information     Name Relation Home Work [place] Spouse [phone]  [phone]      TELEHEALTH VISIT STATEMENT Due to the COVID-19 crisis, this visit was done via telemedicine from my office and it was initiated and consent by this patient and or family.  I connected with patient OR PROXY by a telephone/video  and verified that I am speaking with the correct person. I discussed the limitations of evaluation and management by telemedicine. The patient expressed understanding and agreed to proceed.  Patrick [name] is home with patient during visit Palliative Care was asked to follow this patient to address advance care planning, complex medical decision making and goals of care clarification.      ASSESSMENT AND / RECOMMENDATIONS:   CODE STATUS: Ramifications and implications of CODE STATUS discussed.  Patient and spouse elects DO NOT RESUSCITATE.  NP signed DNR for patient to keep at home.  Goals of Care: Goals include to maximize quality of life and symptom management  Symptom Management/Plan: Dementia: worsening short memory loss/confusion. Impoverished thoughts, impaired judgement, word-finding difficulty. Encourage reminiscence, word search/puzzles, cueing for recollection.  Promote calm approach and engaging environment.  Optimize ongoing supportive care.  Protein caloric malnutrition: Provide enough time and  assistance during meals. Offer 4-6 small meals. Take Ensure daily.  ST consult as needed. Available Albumin level at lower end of normal.   Latest Reference Range & Units Most Recent  Albumin 3.5 - 5.0 g/dL 3.5 07/29/18 03:46  Repeat CBC CMP. Next fu with PCP in April '23 Current weight 170 Ibs down from 220 Ibs a year ago Gait disturbance: Use assistive device for support; encouraged use of rolling walker/cane to help provide support and prevent fall, no clutter/trauma rugs and pathways.  Balance of safety and performance activity. Follow up: Palliative care will continue to follow for complex medical decision making, advance care planning, and clarification of goals. Return 6 weeks or prn. Encouraged to call provider sooner with any concerns.   Family /Caregiver/Community Supports: Patient lives at home with spouse.  HOSPICE ELIGIBILITY/DIAGNOSIS: TBD  Chief Complaint: Follow-up visit  HISTORY OF PRESENT ILLNESS:  Patrick [name]. is a 88 y.o. year old male  with multiple morbidities including worsening dementia, protein caloric malnutrition history of prostate CA, gait disturbance, HTN.  Patient denied pain/discomfort, endorsed memory loss. History obtained from review of EMR, discussion with primary team, caregiver, family and/or [name].  Review and summarization of Epic records shows history from other than patient. Rest of 10 point ROS asked and negative.  I reviewed as needed, available labs, patient records, imaging, studies and related documents from the EMR.  PAST MEDICAL HISTORY:  Active Ambulatory Problems    Diagnosis Date Noted   ED (erectile dysfunction) of organic origin 06/29/2012   Genuine stress incontinence, male 06/29/2012   PVD (peripheral vascular disease) (HCC)    Anemia of chronic disease    IBS (irritable bowel syndrome)    Bradycardia  Benign essential HTN 07/29/2014   Dyslipidemia 07/29/2014   Pulmonary hypertension ([place]) 10/31/2014   OP  (osteoporosis) 03/20/2015   Allergic rhinitis 03/20/2015   Lumbar canal stenosis 03/20/2015   Vitamin D deficiency 03/20/2015   Restless legs syndrome 03/20/2015   Drug-induced gynecomastia 03/20/2015   Diaphragmatic hernia 03/20/2015   Acquired cyst of kidney 03/20/2015   Left ventricular hypertrophy by electrocardiogram 03/20/2015   Leg pain 03/20/2015   History of pneumonia 03/20/2015   Non-rheumatic tricuspid valve insufficiency 95/18/8416   Dysmetabolic syndrome 60/63/0160   Bone pain 06/13/2015   Malignant neoplasm of prostate ([place]) 06/29/2012   Spinal stenosis 06/13/2015   Elevated prostate specific antigen (PSA) 09/08/2015   Secondary hyperparathyroidism ([place]) 10/93/2355   Metabolic acidosis, normal anion gap (NAG) 07/27/2018   Dementia without behavioral disturbance ([place]) 07/27/2018   Glaucoma 07/27/2018   CKD (chronic kidney disease) stage 3, GFR 30-59 ml/min (HCC) 04/05/2019   Mild major depression ([facility]) 01/10/2020   Unstable angina pectoris ([facility]) 07/14/2020   Mild protein-calorie malnutrition ([facility]) 07/14/2020   Resolved Ambulatory Problems    Diagnosis Date Noted   GERD (gastroesophageal reflux disease)    Chronic kidney disease, stage IV (severe) ([facility]) 03/20/2015   Claudication ([facility]) 06/07/2015   Mild cognitive impairment 01/03/2016   Syncope 07/27/2018   Past Medical History:  Diagnosis Date   Anemia    Arthritis    CKD (chronic kidney disease), stage IV (HCC)    High cholesterol    Hypertension    Pneumonia    Prostate cancer ([facility])    Pulmonary HTN ([facility]) 11/05/2015    SOCIAL HX:  Social History   Tobacco Use   Smoking status: Former    Packs/day: 0.30    Years: 20.00    Pack years: 6.00    Types: Cigarettes    Quit date: 08/26/1978    Years since quitting: 43.2   Smokeless tobacco: Never   Tobacco comments:    smoking cessation materials not required  Substance Use Topics   Alcohol use: No    Alcohol/week: 0.0 standard drinks     FAMILY  HX:  Family History  Problem Relation Age of Onset   CVA Mother    Hypertension Mother    Dementia Mother    Heart attack Father    Heart disease Father        before age 49   Hypertension Father    Hepatitis C Sister    Dementia Sister       ALLERGIES:  Allergies  Allergen Reactions   Ace Inhibitors Cough    cough   Aricept [Donepezil] Nausea And Vomiting      PERTINENT MEDICATIONS:  Outpatient Encounter Medications as of 11/01/2021  Medication Sig   amLODipine (NORVASC) 10 MG tablet Take 1 tablet (10 mg total) by mouth daily.   aspirin 81 MG tablet Take 81 mg by mouth daily.    atorvastatin (LIPITOR) 40 MG tablet Take 1 tablet (40 mg total) by mouth every evening.   hydrALAZINE (APRESOLINE) 25 MG tablet TAKE ONE TABLET BY MOUTH THREE TIMES DAILY   vitamin C (ASCORBIC ACID) 500 MG tablet Take 500 mg by mouth daily.   Vitamin E 400 UNITS CHEW Chew 400 Units by mouth daily.    No facility-administered encounter medications on file as of 11/01/2021.   I spent 45 minutes providing this consultation; time includes spent with patient/family, chart review and documentation. More than 50% of the time in this consultation was spent on care  coordination   Thank you for the opportunity to participate in the care of [name].  The palliative care team will continue to follow. Please call our office at [phone] if we can be of additional assistance.   Note: Portions of this note were generated with Lobbyist. Dictation errors may occur despite best attempts at proofreading.  Teodoro Spray, NP

## 2021-12-16 ENCOUNTER — Other Ambulatory Visit: Payer: Self-pay

## 2021-12-16 ENCOUNTER — Inpatient Hospital Stay (HOSPITAL_COMMUNITY)
Admission: EM | Admit: 2021-12-16 | Discharge: 2022-01-10 | DRG: 056 | Disposition: A | Discharge status: Hospice | Payer: Medicare HMO | Attending: Family Medicine | Admitting: Family Medicine

## 2021-12-16 ENCOUNTER — Emergency Department (HOSPITAL_COMMUNITY): Payer: Medicare HMO

## 2021-12-16 ENCOUNTER — Observation Stay (HOSPITAL_COMMUNITY): Payer: Medicare HMO

## 2021-12-16 DIAGNOSIS — D61818 Other pancytopenia: Secondary | ICD-10-CM | POA: Diagnosis present

## 2021-12-16 DIAGNOSIS — I10 Essential (primary) hypertension: Secondary | ICD-10-CM | POA: Diagnosis present

## 2021-12-16 DIAGNOSIS — G2581 Restless legs syndrome: Secondary | ICD-10-CM | POA: Diagnosis present

## 2021-12-16 DIAGNOSIS — N179 Acute kidney failure, unspecified: Secondary | ICD-10-CM | POA: Diagnosis not present

## 2021-12-16 DIAGNOSIS — I739 Peripheral vascular disease, unspecified: Secondary | ICD-10-CM | POA: Diagnosis present

## 2021-12-16 DIAGNOSIS — E7800 Pure hypercholesterolemia, unspecified: Secondary | ICD-10-CM | POA: Diagnosis present

## 2021-12-16 DIAGNOSIS — Z515 Encounter for palliative care: Secondary | ICD-10-CM | POA: Diagnosis not present

## 2021-12-16 DIAGNOSIS — K661 Hemoperitoneum: Secondary | ICD-10-CM | POA: Diagnosis not present

## 2021-12-16 DIAGNOSIS — N289 Disorder of kidney and ureter, unspecified: Secondary | ICD-10-CM

## 2021-12-16 DIAGNOSIS — D649 Anemia, unspecified: Secondary | ICD-10-CM | POA: Diagnosis not present

## 2021-12-16 DIAGNOSIS — Z8546 Personal history of malignant neoplasm of prostate: Secondary | ICD-10-CM | POA: Diagnosis not present

## 2021-12-16 DIAGNOSIS — E860 Dehydration: Secondary | ICD-10-CM | POA: Diagnosis present

## 2021-12-16 DIAGNOSIS — R58 Hemorrhage, not elsewhere classified: Secondary | ICD-10-CM | POA: Diagnosis not present

## 2021-12-16 DIAGNOSIS — Z79899 Other long term (current) drug therapy: Secondary | ICD-10-CM | POA: Diagnosis not present

## 2021-12-16 DIAGNOSIS — S0990XA Unspecified injury of head, initial encounter: Secondary | ICD-10-CM | POA: Diagnosis not present

## 2021-12-16 DIAGNOSIS — I129 Hypertensive chronic kidney disease with stage 1 through stage 4 chronic kidney disease, or unspecified chronic kidney disease: Secondary | ICD-10-CM | POA: Diagnosis not present

## 2021-12-16 DIAGNOSIS — F02C0 Dementia in other diseases classified elsewhere, severe, without behavioral disturbance, psychotic disturbance, mood disturbance, and anxiety: Secondary | ICD-10-CM | POA: Diagnosis not present

## 2021-12-16 DIAGNOSIS — N185 Chronic kidney disease, stage 5: Secondary | ICD-10-CM | POA: Diagnosis present

## 2021-12-16 DIAGNOSIS — I272 Pulmonary hypertension, unspecified: Secondary | ICD-10-CM | POA: Diagnosis not present

## 2021-12-16 DIAGNOSIS — G309 Alzheimer's disease, unspecified: Principal | ICD-10-CM | POA: Diagnosis present

## 2021-12-16 DIAGNOSIS — E861 Hypovolemia: Secondary | ICD-10-CM | POA: Diagnosis present

## 2021-12-16 DIAGNOSIS — Z751 Person awaiting admission to adequate facility elsewhere: Secondary | ICD-10-CM

## 2021-12-16 DIAGNOSIS — W06XXXA Fall from bed, initial encounter: Secondary | ICD-10-CM | POA: Diagnosis not present

## 2021-12-16 DIAGNOSIS — Z7401 Bed confinement status: Secondary | ICD-10-CM | POA: Diagnosis not present

## 2021-12-16 DIAGNOSIS — D638 Anemia in other chronic diseases classified elsewhere: Secondary | ICD-10-CM | POA: Diagnosis present

## 2021-12-16 DIAGNOSIS — E43 Unspecified severe protein-calorie malnutrition: Secondary | ICD-10-CM | POA: Diagnosis not present

## 2021-12-16 DIAGNOSIS — Z7189 Other specified counseling: Secondary | ICD-10-CM | POA: Diagnosis not present

## 2021-12-16 DIAGNOSIS — R531 Weakness: Secondary | ICD-10-CM | POA: Diagnosis not present

## 2021-12-16 DIAGNOSIS — I3139 Other pericardial effusion (noninflammatory): Secondary | ICD-10-CM | POA: Diagnosis present

## 2021-12-16 DIAGNOSIS — K683 Retroperitoneal hematoma: Secondary | ICD-10-CM

## 2021-12-16 DIAGNOSIS — Y92230 Patient room in hospital as the place of occurrence of the external cause: Secondary | ICD-10-CM | POA: Diagnosis not present

## 2021-12-16 DIAGNOSIS — M5030 Other cervical disc degeneration, unspecified cervical region: Secondary | ICD-10-CM | POA: Diagnosis not present

## 2021-12-16 DIAGNOSIS — R404 Transient alteration of awareness: Secondary | ICD-10-CM | POA: Diagnosis not present

## 2021-12-16 DIAGNOSIS — R296 Repeated falls: Secondary | ICD-10-CM | POA: Diagnosis present

## 2021-12-16 DIAGNOSIS — W19XXXA Unspecified fall, initial encounter: Secondary | ICD-10-CM | POA: Diagnosis not present

## 2021-12-16 DIAGNOSIS — N2581 Secondary hyperparathyroidism of renal origin: Secondary | ICD-10-CM | POA: Diagnosis present

## 2021-12-16 DIAGNOSIS — R9082 White matter disease, unspecified: Secondary | ICD-10-CM | POA: Diagnosis not present

## 2021-12-16 DIAGNOSIS — Z66 Do not resuscitate: Secondary | ICD-10-CM | POA: Diagnosis not present

## 2021-12-16 DIAGNOSIS — F0390 Unspecified dementia without behavioral disturbance: Secondary | ICD-10-CM | POA: Diagnosis not present

## 2021-12-16 DIAGNOSIS — Z87891 Personal history of nicotine dependence: Secondary | ICD-10-CM

## 2021-12-16 DIAGNOSIS — R410 Disorientation, unspecified: Secondary | ICD-10-CM | POA: Diagnosis not present

## 2021-12-16 DIAGNOSIS — M810 Age-related osteoporosis without current pathological fracture: Secondary | ICD-10-CM | POA: Diagnosis present

## 2021-12-16 DIAGNOSIS — R55 Syncope and collapse: Secondary | ICD-10-CM | POA: Diagnosis not present

## 2021-12-16 DIAGNOSIS — W2209XA Striking against other stationary object, initial encounter: Secondary | ICD-10-CM | POA: Diagnosis present

## 2021-12-16 DIAGNOSIS — Y92009 Unspecified place in unspecified non-institutional (private) residence as the place of occurrence of the external cause: Secondary | ICD-10-CM | POA: Diagnosis not present

## 2021-12-16 DIAGNOSIS — E875 Hyperkalemia: Secondary | ICD-10-CM | POA: Diagnosis not present

## 2021-12-16 DIAGNOSIS — Z823 Family history of stroke: Secondary | ICD-10-CM

## 2021-12-16 DIAGNOSIS — K5900 Constipation, unspecified: Secondary | ICD-10-CM | POA: Diagnosis present

## 2021-12-16 DIAGNOSIS — I151 Hypertension secondary to other renal disorders: Secondary | ICD-10-CM | POA: Diagnosis not present

## 2021-12-16 DIAGNOSIS — I700 Atherosclerosis of aorta: Secondary | ICD-10-CM | POA: Diagnosis present

## 2021-12-16 DIAGNOSIS — K8689 Other specified diseases of pancreas: Secondary | ICD-10-CM | POA: Diagnosis present

## 2021-12-16 DIAGNOSIS — N189 Chronic kidney disease, unspecified: Secondary | ICD-10-CM | POA: Diagnosis present

## 2021-12-16 DIAGNOSIS — R112 Nausea with vomiting, unspecified: Secondary | ICD-10-CM | POA: Diagnosis not present

## 2021-12-16 DIAGNOSIS — H4010X0 Unspecified open-angle glaucoma, stage unspecified: Secondary | ICD-10-CM | POA: Diagnosis present

## 2021-12-16 DIAGNOSIS — Z681 Body mass index (BMI) 19 or less, adult: Secondary | ICD-10-CM

## 2021-12-16 DIAGNOSIS — Z91148 Patient's other noncompliance with medication regimen for other reason: Secondary | ICD-10-CM

## 2021-12-16 DIAGNOSIS — N3289 Other specified disorders of bladder: Secondary | ICD-10-CM | POA: Diagnosis not present

## 2021-12-16 DIAGNOSIS — R54 Age-related physical debility: Secondary | ICD-10-CM | POA: Diagnosis present

## 2021-12-16 DIAGNOSIS — I161 Hypertensive emergency: Secondary | ICD-10-CM

## 2021-12-16 DIAGNOSIS — Z8249 Family history of ischemic heart disease and other diseases of the circulatory system: Secondary | ICD-10-CM

## 2021-12-16 DIAGNOSIS — Z7982 Long term (current) use of aspirin: Secondary | ICD-10-CM

## 2021-12-16 DIAGNOSIS — S199XXA Unspecified injury of neck, initial encounter: Secondary | ICD-10-CM | POA: Diagnosis not present

## 2021-12-16 DIAGNOSIS — R627 Adult failure to thrive: Secondary | ICD-10-CM | POA: Diagnosis not present

## 2021-12-16 DIAGNOSIS — Z888 Allergy status to other drugs, medicaments and biological substances status: Secondary | ICD-10-CM

## 2021-12-16 DIAGNOSIS — I120 Hypertensive chronic kidney disease with stage 5 chronic kidney disease or end stage renal disease: Secondary | ICD-10-CM | POA: Diagnosis present

## 2021-12-16 DIAGNOSIS — R633 Feeding difficulties, unspecified: Secondary | ICD-10-CM | POA: Diagnosis present

## 2021-12-16 DIAGNOSIS — Z789 Other specified health status: Secondary | ICD-10-CM | POA: Diagnosis not present

## 2021-12-16 DIAGNOSIS — F03B0 Unspecified dementia, moderate, without behavioral disturbance, psychotic disturbance, mood disturbance, and anxiety: Secondary | ICD-10-CM | POA: Diagnosis not present

## 2021-12-16 DIAGNOSIS — N184 Chronic kidney disease, stage 4 (severe): Secondary | ICD-10-CM | POA: Diagnosis not present

## 2021-12-16 DIAGNOSIS — E559 Vitamin D deficiency, unspecified: Secondary | ICD-10-CM | POA: Diagnosis present

## 2021-12-16 DIAGNOSIS — N2889 Other specified disorders of kidney and ureter: Secondary | ICD-10-CM | POA: Diagnosis not present

## 2021-12-16 DIAGNOSIS — L8990 Pressure ulcer of unspecified site, unspecified stage: Secondary | ICD-10-CM | POA: Insufficient documentation

## 2021-12-16 DIAGNOSIS — S80211A Abrasion, right knee, initial encounter: Secondary | ICD-10-CM | POA: Diagnosis not present

## 2021-12-16 LAB — COMPREHENSIVE METABOLIC PANEL
ALT: 14 U/L (ref 0–44)
AST: 11 U/L — ABNORMAL LOW (ref 15–41)
Albumin: 4.2 g/dL (ref 3.5–5.0)
Alkaline Phosphatase: 54 U/L (ref 38–126)
Anion gap: 9 (ref 5–15)
BUN: 68 mg/dL — ABNORMAL HIGH (ref 8–23)
CO2: 21 mmol/L — ABNORMAL LOW (ref 22–32)
Calcium: 10.3 mg/dL (ref 8.9–10.3)
Chloride: 109 mmol/L (ref 98–111)
Creatinine, Ser: 6.45 mg/dL — ABNORMAL HIGH (ref 0.61–1.24)
GFR, Estimated: 8 mL/min — ABNORMAL LOW (ref >60)
Glucose, Bld: 104 mg/dL — ABNORMAL HIGH (ref 70–99)
Potassium: 5 mmol/L (ref 3.5–5.1)
Sodium: 139 mmol/L (ref 135–145)
Total Bilirubin: 0.7 mg/dL (ref 0.3–1.2)
Total Protein: 7.7 g/dL (ref 6.5–8.1)

## 2021-12-16 LAB — CBC WITH DIFFERENTIAL/PLATELET
Abs Immature Granulocytes: 0 10*3/uL (ref 0.00–0.07)
Basophils Absolute: 0 10*3/uL (ref 0.0–0.1)
Basophils Relative: 0 %
Eosinophils Absolute: 0 10*3/uL (ref 0.0–0.5)
Eosinophils Relative: 0 %
HCT: 30.3 % — ABNORMAL LOW (ref 39.0–52.0)
Hemoglobin: 10.1 g/dL — ABNORMAL LOW (ref 13.0–17.0)
Immature Granulocytes: 0 %
Lymphocytes Relative: 25 %
Lymphs Abs: 0.7 10*3/uL (ref 0.7–4.0)
MCH: 31.9 pg (ref 26.0–34.0)
MCHC: 33.3 g/dL (ref 30.0–36.0)
MCV: 95.6 fL (ref 80.0–100.0)
Monocytes Absolute: 0.2 10*3/uL (ref 0.1–1.0)
Monocytes Relative: 7 %
Neutro Abs: 1.9 10*3/uL (ref 1.7–7.7)
Neutrophils Relative %: 68 %
Platelets: 128 10*3/uL — ABNORMAL LOW (ref 150–400)
RBC: 3.17 MIL/uL — ABNORMAL LOW (ref 4.22–5.81)
RDW: 12.8 % (ref 11.5–15.5)
WBC: 2.9 10*3/uL — ABNORMAL LOW (ref 4.0–10.5)
nRBC: 0 % (ref 0.0–0.2)

## 2021-12-16 LAB — TROPONIN I (HIGH SENSITIVITY)
Troponin I (High Sensitivity): 17 ng/L (ref <18)
Troponin I (High Sensitivity): 20 ng/L — ABNORMAL HIGH (ref <18)
Troponin I (High Sensitivity): 20 ng/L — ABNORMAL HIGH (ref <18)
Troponin I (High Sensitivity): 19 ng/L — ABNORMAL HIGH (ref <18)

## 2021-12-16 MED ORDER — HEPARIN SODIUM (PORCINE) 5000 UNIT/ML IJ SOLN
5000.0000 [IU] | Freq: Three times a day (TID) | INTRAMUSCULAR | Status: DC
  Administered 2021-12-17 – 2021-12-24 (×23): 5000 [IU] via SUBCUTANEOUS
  Filled 2021-12-16 (×24): qty 1

## 2021-12-16 MED ORDER — LACTATED RINGERS IV SOLN: INTRAVENOUS | Status: DC

## 2021-12-16 MED ORDER — LACTATED RINGERS IV SOLN: INTRAVENOUS | Status: AC

## 2021-12-16 MED ORDER — HYDRALAZINE HCL 25 MG PO TABS
25.0000 mg | ORAL_TABLET | Freq: Three times a day (TID) | ORAL | Status: DC
  Filled 2021-12-16: qty 1

## 2021-12-16 MED ORDER — AMLODIPINE BESYLATE 10 MG PO TABS
10.0000 mg | ORAL_TABLET | Freq: Once | ORAL | Status: AC
  Administered 2021-12-17: 10 mg via ORAL
  Filled 2021-12-16: qty 1

## 2021-12-16 MED ORDER — HYDRALAZINE HCL 20 MG/ML IJ SOLN
10.0000 mg | Freq: Once | INTRAMUSCULAR | Status: AC
  Administered 2021-12-16: 10 mg via INTRAVENOUS
  Filled 2021-12-16: qty 1

## 2021-12-16 MED ORDER — HYDRALAZINE HCL 25 MG PO TABS: 25.0000 mg | ORAL_TABLET | Freq: Three times a day (TID) | ORAL | Status: DC

## 2021-12-16 MED ORDER — AMLODIPINE BESYLATE 5 MG PO TABS
10.0000 mg | ORAL_TABLET | Freq: Once | ORAL | Status: AC
  Administered 2021-12-16: 10 mg via ORAL
  Filled 2021-12-16: qty 2

## 2021-12-16 MED ORDER — ATORVASTATIN CALCIUM 40 MG PO TABS
40.0000 mg | ORAL_TABLET | Freq: Every evening | ORAL | Status: DC
  Administered 2021-12-17 – 2021-12-24 (×7): 40 mg via ORAL
  Filled 2021-12-16 (×7): qty 1

## 2021-12-16 MED ORDER — LACTATED RINGERS IV BOLUS
1000.0000 mL | Freq: Once | INTRAVENOUS | Status: AC
  Administered 2021-12-16: 1000 mL via INTRAVENOUS

## 2021-12-16 NOTE — ED Triage Notes (Signed)
BIB EMS from home for increased weakness, n/v/ and increased agitation. Decrease in oral intake. Pt had 2 syncopal episodes in the last 3 days and 1 fall yesterday. He did not hit his head. Pt is oriented to self only w/hx of dementia  ?

## 2021-12-16 NOTE — H&P (Addendum)
Family Medicine Teaching Service ?Hospital Admission History and Physical ?Service Pager: [phone] ? ?Patient name: Patrick [name]. Medical record number: [id number] ?Date of birth: 08-Feb-1932 Age: 88 y.o. Gender: male ? ?Primary Care Provider: Steele Sizer, MD ?Consultants: Nephrology, palliative  ?Code Status: DNR  ?Preferred Emergency Contact:  ? Name Relation Home Work Mobile  ? [place]  [phone]  ? ?Chief Complaint: Unstable ambulation, previous fall, uncontrolled HTN  ? ?Assessment and Plan: ?Patrick P Caliph [place]ak. is a 88 y.o. male presenting with uncontrolled HTN, previous fall, unstable ambulation. PMHx is significant for HTN, CKD, dementia, glaucoma, dyslipidemia, h/o prostate cancer, osteoporosis, protein calorie malnutrition, ACD.  ? ?Uncontrolled HTN  ?Patient has been hypertensive to 180-210s/77-100s in ED and is asymptomatic currently (Per patient, although unreliable historian). Noncompliant with medications over last couple of days with decreased PO intake, which is likely contributing. No presence of stroke symptoms, without new focal neurological abnormalities, and patient denies pain. Will decrease BP slowly to ensure adequate perfusion to the kidneys, monitoring creatinine with regular BMPs. Home medications: Hydralazine 25 mg TID, Amlodipine 10 mg. In the ER, patient received Hydralazine 10 mg IV x1, Amlodipine 10 mg oral x1, with 1L LR bolus.  ?- Admit to FPTS (med-tele), Dr. Owens Shark attending  ?- Continue with AM CBC with diff, RFP, mg  ?- Continue home Amlodipine  ?- Home Hydralazine to restart 4/10 unless severe HTN overnight ?- mIVF 125 mL/hr LR x7 hours  ?- start heparin subq q8 in AM  ?- Continue cardiac monitoring ?- VS per floor protocol>monitor closely  ?- SLP to see for swallow study>passed, ordered renal diet  ? ?Acute on Chronic Kidney Failure  Secondary Hyperparathyroidism  Vitamin D deficiency  ?Patient is seen at [facility] with Dr.  Justin Mend, has an AV fistula in place. Cr 3.96>6.45 with GFR 8 today. Patient had not received dialysis as his kidney function had been improving and monitored with [facility]. This AKI is likely prerenal in nature and related to dehydration as patient was hypovolemic on exam with lack of nutrition and recent vomiting. Possibly related to obstruction as well given history of prostate cancer, so we will monitor output as well.  ?- AM Consult to nephrology ?- Strict I/Os  ?- Consider imaging if worsening in setting of rehydration  ?- Continue LR as above  ?- CXR to assess baseline volume status  ? ?Alzheimer's dementia  Unstable Ambulation  H/o Fall  ?Patient has history of Alzheimer's dementia that appears to have been gradually worsening. He takes no medication for this and is cared for at home by his wife. Patient unable to participate in ADLs per previous note. The change in ambulation and falls could be related to worsening dementia symptoms, lack of nutrition/vitamin deficiency, increased intracranial pressure, stroke (although unlikely given neuro exam). CT head showed no acute findings, ruling out hemorrhage. Consider further imaging if focal neurological deficit is present and would order vitamin B12/folate levels. Patient would likely benefit from nursing facility placement as wife has had medical hardships preventing her from taking care of patient in the home (see H&P), though we will confirm with family.  ?- Consider vitamin B12, folate  ?- Consider further MRI if worsening  ?- Delirium precautions  ?- Fall precautions  ?- neuro checks q shift  ?- reassess neuro exam in AM  ?- Consider neurology consult if worsening ?- PT/OT eval in the AM ? ?GOC  ?Patient has seen palliative care outpatient with [facility], at  the time wanted to maximize quality of life and symptoms management. Patient and wife would benefit from further discussion with inpatient Palliative to discuss goals in this setting.    ?- Consult to palliative for further discussion  ? ?Pancytopenia  H/o ACD ?WBC 2.9, Hgb 10.1, plt 128, could be related to bone marrow infiltration versus failure. Likely cause could be very poor nutrition. Less likely malignant, infectious, or myelodysplastic causes.  ?- Follow up AM CBC with diff  ?- Consider oncology involvement if worsening  ? ?Dyslipidemia  ?Last lipid panel: LDL 74, Triglycerides 92, HDL 62, total 154 on 11/13/20.  ?- Continue home Atorvastatin 40 mg daily.  ? ?Protein calorie malnutrition  Weight Loss  ?The patient's weight has been downtrending overtime as he is not eating, likely related to dementia progression versus metastasis.  ?- Adding ensure TID with meals  ?- Possibly involve RD  ? ?Open Angle Glaucoma  ?Nodule and swelling in the eye with abnormalities on eye exam are chronic and have been present for years, per wife.  ? ?H/o prostate cancer  ?In 2022, prostatic cancer was negative for metastasis via bone scan (per last PCP note). Patient has been seen by urology in 2022, has had Lupron in the past but not taking now.  ? ?MDD, stable  ?Took Zoloft in the past, no longer takes this medication. Euthymic mood today.  ? ?FEN/GI: Renal diet   ?Prophylaxis: Hep subq ? ?Disposition: Med-tele  ? ?History of Present Illness:  Patrick Quaintance. is a 88 y.o. male presenting with difficulty with ambulation and a fall. PMHx is significant for HTN, CKD, dementia, glaucoma, dyslipidemia, h/o prostate cancer, osteoporosis, protein calorie malnutrition, ACD. History provided by wife via phone.  ? ?His wife brought him to the ED because he fell last night but did not hit his head or have LOC, has been stumbling with walking, vomited last night x1-NBNB. Wife reports that he has not been taking his medications over the last two days and has not been "acting like himself." She notes that he has to grab the wall every time he walks. Patient has refused his medications for 2 days and has had greatly  difficulty eating over the past couple of years. Patient has lost sixty pounds in the last six months, will not eat and water.  ? ?Wife reports that the nodule on the eye has been present for three years with a history of glaucoma. She reports that his optometrist stated he will likely lose the eye.  ? ?Patient has an AV fistula in the left arm. He has seen [facility] but has not had dialysis.  ? ?[facility] follows patient outpatient, he is DNR.  ? ?Of note, wife has a "slipped disk" and is due to have surgery. She has been unable to care for him since the beginning of this year given her lack of mobility.  ? ?Per patient report in room (level 5 caveat):  ?Unsure of what medications he takes every day. Has not taken his medications for "a while" because he doesn't take any normally. Denies difficulty with walking. Does report that he walked into a cabinet in his kitchen and hit his cheek/eye and has been having problems every since (3 weeks ago). He ran into the cabinet twice and denies passing out. 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 He lives home with wife and has a daughter. ? ?Denies alcohol, smoking, illicit drug use.  ? ?Last had  a BM about 1 week ago.  ? ?Review Of Systems: Per HPI with the following additions:  ? ?Review of Systems  ?Constitutional:  Negative for appetite change, fatigue and fever.  ?HENT:  Negative for congestion and sore throat.   ?Eyes:  Positive for visual disturbance (in the left eye).  ?Respiratory:  Negative for cough and shortness of breath.   ?Cardiovascular:  Negative for chest pain, palpitations and leg swelling.  ?Gastrointestinal:  Negative for abdominal pain, diarrhea, nausea and vomiting.  ?Genitourinary:  Negative for decreased urine volume, difficulty urinating and dysuria.  ?Skin:  Negative for rash.  ?Neurological:  Negative for dizziness, weakness, light-headedness and headaches.   ? ?Patient Active Problem List  ? Diagnosis Date Noted  ?  HTN (hypertension) 12/16/2021  ? Unstable angina pectoris ([place]) 07/14/2020  ? Mild protein-calorie malnutrition ([facility]) 07/14/2020  ? Mild major depression ([place]) 01/10/2020  ? CKD (chronic kidney disease) stage 3

## 2021-12-16 NOTE — ED Provider Notes (Signed)
?[facility] ?Provider Note ? ? ?CSN: [id number] ?Arrival date & time: 12/16/21  1327 ? ?  ? ?History ? ?Chief Complaint  ?Patient presents with  ? Weakness  ? ? ?Ashford P Damont Balles. is a 88 y.o. male. ? ?HPI ? ?  ? ?Keyvon P Artemus Romanoff. is a 88 y.o. year old male  with multiple morbidities including worsening dementia, protein caloric malnutrition, history of prostate CA, gait disturbance, HTN, PVD, hyperlipidemia comes in with chief complaint of weakness. ? ?Patient has dementia and unable to provide meaningful history.  He is oriented to self and knows his birthday. ? ?I spoke with patient's wife, Mrs. Bueford.  She indicates that patient has not been himself since last Monday.  He has had very little to eat and drink, he has been unsteady with his gait, more confused and meaner than usual.  He had 2 falls and had a large volume emesis on Thursday and then again yesterday.  Patient also has not been taking his medications, stating that God will take care of him. ? ?Normally patient ambulates without difficulty and in fact helps her with small chores around the house. ? ? ? ?Home Medications ?Prior to Admission medications   ?Medication Sig Start Date End Date Taking? Authorizing Provider  ?amLODipine (NORVASC) 10 MG tablet Take 1 tablet (10 mg total) by mouth daily. 06/12/21   Steele Sizer, MD  ?aspirin 81 MG tablet Take 81 mg by mouth daily.  10/05/09   [provider]  ?atorvastatin (LIPITOR) 40 MG tablet Take 1 tablet (40 mg total) by mouth every evening. 06/12/21   Steele Sizer, MD  ?hydrALAZINE (APRESOLINE) 25 MG tablet TAKE ONE TABLET BY MOUTH THREE TIMES DAILY 10/04/21   Sueanne Margarita, MD  ?vitamin C (ASCORBIC ACID) 500 MG tablet Take 500 mg by mouth daily.    [provider]  ?Vitamin E 400 UNITS CHEW Chew 400 Units by mouth daily.     [provider]  ?   ? ?Allergies    ?Ace inhibitors and Aricept [donepezil]   ? ?Review of Systems    ?Review of Systems ? ?Physical Exam ?Updated Vital Signs ?BP (!) 212/77   Pulse (!) 57   Temp 97.7 ?F (36.5 ?C) (Oral)   Resp (!) 9   SpO2 100%  ?Physical Exam ?Vitals and nursing note reviewed.  ?Constitutional:   ?   Appearance: He is well-developed.  ?Eyes:  ?   Comments: Edema of the left conjunctiva, patient's left eye stops at midline with lateral gaze  ?Cardiovascular:  ?   Rate and Rhythm: Normal rate.  ?Pulmonary:  ?   Effort: Pulmonary effort is normal.  ?Musculoskeletal:  ?   Cervical back: Neck supple.  ?Skin: ?   General: Skin is warm.  ?Neurological:  ?   Mental Status: He is alert. Mental status is at baseline. He is disoriented.  ?   Cranial Nerves: No cranial nerve deficit.  ?   Sensory: No sensory deficit.  ?   Motor: No weakness.  ?   Comments: Internuclear ophthalmoplegia type finding over the left eye  ? ? ?ED Results / Procedures / Treatments   ?Labs ?(all labs ordered are listed, but only abnormal results are displayed) ?Labs Reviewed  ?COMPREHENSIVE METABOLIC PANEL - Abnormal; Notable for the following components:  ?    Result Value  ? CO2 21 (*)   ? Glucose, Bld 104 (*)   ? BUN 68 (*)   ?  Creatinine, Ser 6.45 (*)   ? AST 11 (*)   ? GFR, Estimated 8 (*)   ? All other components within normal limits  ?CBC WITH DIFFERENTIAL/PLATELET - Abnormal; Notable for the following components:  ? WBC 2.9 (*)   ? RBC 3.17 (*)   ? Hemoglobin 10.1 (*)   ? HCT 30.3 (*)   ? Platelets 128 (*)   ? All other components within normal limits  ?TROPONIN I (HIGH SENSITIVITY)  ?TROPONIN I (HIGH SENSITIVITY)  ? ? ?EKG ?EKG Interpretation ? ?Date/Time:  Sunday December 16 2021 13:41:15 EDT ?Ventricular Rate:  65 ?PR Interval:  180 ?QRS Duration: 110 ?QT Interval:  426 ?QTC Calculation: 443 ?R Axis:   71 ?Text Interpretation: Sinus rhythm Anteroseptal infarct, age indeterminate No acute changes No significant change since last tracing Confirmed by Varney Biles [phone]) on 12/16/2021 4:32:33 PM ? ?Radiology ?CT Head Wo  Contrast ? ?Result Date: 12/16/2021 ?CLINICAL DATA:  Head and neck trauma EXAM: CT HEAD WITHOUT CONTRAST CT CERVICAL SPINE WITHOUT CONTRAST TECHNIQUE: Multidetector CT imaging of the head and cervical spine was performed following the standard protocol without intravenous contrast. Multiplanar CT image reconstructions of the cervical spine were also generated. RADIATION DOSE REDUCTION: This exam was performed according to the departmental dose-optimization program which includes automated exposure control, adjustment of the mA and/or kV according to patient size and/or use of iterative reconstruction technique. COMPARISON:  None. FINDINGS: CT HEAD FINDINGS Brain: No evidence of acute infarction, hemorrhage, hydrocephalus, extra-axial collection or mass lesion/mass effect. Mild periventricular white matter hypodensity. Vascular: No hyperdense vessel or unexpected calcification. Skull: Normal. Negative for fracture or focal lesion. Sinuses/Orbits: No acute finding. Other: None. CT CERVICAL SPINE FINDINGS Alignment: Normal. Skull base and vertebrae: No acute fracture. No primary bone lesion or focal pathologic process. Soft tissues and spinal canal: No prevertebral fluid or swelling. No visible canal hematoma. Disc levels: Moderate multilevel disc space height loss and osteophytosis throughout. Upper chest: Negative. Other: None. IMPRESSION: 1. No acute intracranial pathology. Small-vessel white matter disease. 2. No fracture or static subluxation of the cervical spine. 3. Moderate multilevel cervical disc degenerative disease. Electronically Signed   By: Delanna Ahmadi M.D.   On: 12/16/2021 14:59  ? ?CT Cervical Spine Wo Contrast ? ?Result Date: 12/16/2021 ?CLINICAL DATA:  Head and neck trauma EXAM: CT HEAD WITHOUT CONTRAST CT CERVICAL SPINE WITHOUT CONTRAST TECHNIQUE: Multidetector CT imaging of the head and cervical spine was performed following the standard protocol without intravenous contrast. Multiplanar CT image  reconstructions of the cervical spine were also generated. RADIATION DOSE REDUCTION: This exam was performed according to the departmental dose-optimization program which includes automated exposure control, adjustment of the mA and/or kV according to patient size and/or use of iterative reconstruction technique. COMPARISON:  None. FINDINGS: CT HEAD FINDINGS Brain: No evidence of acute infarction, hemorrhage, hydrocephalus, extra-axial collection or mass lesion/mass effect. Mild periventricular white matter hypodensity. Vascular: No hyperdense vessel or unexpected calcification. Skull: Normal. Negative for fracture or focal lesion. Sinuses/Orbits: No acute finding. Other: None. CT CERVICAL SPINE FINDINGS Alignment: Normal. Skull base and vertebrae: No acute fracture. No primary bone lesion or focal pathologic process. Soft tissues and spinal canal: No prevertebral fluid or swelling. No visible canal hematoma. Disc levels: Moderate multilevel disc space height loss and osteophytosis throughout. Upper chest: Negative. Other: None. IMPRESSION: 1. No acute intracranial pathology. Small-vessel white matter disease. 2. No fracture or static subluxation of the cervical spine. 3. Moderate multilevel cervical disc degenerative disease. Electronically Signed  By: Delanna Ahmadi M.D.   On: 12/16/2021 14:59   ? ?Procedures ?[name] KitchenCritical Care ?Performed by: Varney Biles, MD ?Authorized by: Varney Biles, MD  ? ?Critical care provider statement:  ?  Critical care time (minutes):  51 ?  Critical care was necessary to treat or prevent imminent or life-threatening deterioration of the following conditions:  Circulatory failure and renal failure ?  Critical care was time spent personally by me on the following activities:  Development of treatment plan with patient or surrogate, discussions with consultants, evaluation of patient's response to treatment, examination of patient, ordering and review of laboratory studies, ordering  and review of radiographic studies, ordering and performing treatments and interventions, pulse oximetry, re-evaluation of patient's condition and review of old charts  ? ? ?Medications Ordered in ED ?Medications

## 2021-12-17 ENCOUNTER — Observation Stay (HOSPITAL_COMMUNITY): Payer: Medicare HMO

## 2021-12-17 ENCOUNTER — Observation Stay (HOSPITAL_BASED_OUTPATIENT_CLINIC_OR_DEPARTMENT_OTHER): Payer: Medicare HMO

## 2021-12-17 DIAGNOSIS — F03B0 Unspecified dementia, moderate, without behavioral disturbance, psychotic disturbance, mood disturbance, and anxiety: Secondary | ICD-10-CM | POA: Diagnosis not present

## 2021-12-17 DIAGNOSIS — Z66 Do not resuscitate: Secondary | ICD-10-CM | POA: Diagnosis not present

## 2021-12-17 DIAGNOSIS — F0390 Unspecified dementia without behavioral disturbance: Secondary | ICD-10-CM

## 2021-12-17 DIAGNOSIS — N179 Acute kidney failure, unspecified: Secondary | ICD-10-CM | POA: Diagnosis not present

## 2021-12-17 DIAGNOSIS — E43 Unspecified severe protein-calorie malnutrition: Secondary | ICD-10-CM | POA: Diagnosis not present

## 2021-12-17 DIAGNOSIS — Z515 Encounter for palliative care: Secondary | ICD-10-CM

## 2021-12-17 DIAGNOSIS — I3139 Other pericardial effusion (noninflammatory): Secondary | ICD-10-CM | POA: Diagnosis not present

## 2021-12-17 DIAGNOSIS — N185 Chronic kidney disease, stage 5: Secondary | ICD-10-CM | POA: Diagnosis not present

## 2021-12-17 DIAGNOSIS — I161 Hypertensive emergency: Secondary | ICD-10-CM

## 2021-12-17 DIAGNOSIS — I151 Hypertension secondary to other renal disorders: Secondary | ICD-10-CM | POA: Diagnosis not present

## 2021-12-17 DIAGNOSIS — Z7189 Other specified counseling: Secondary | ICD-10-CM

## 2021-12-17 DIAGNOSIS — N2889 Other specified disorders of kidney and ureter: Secondary | ICD-10-CM

## 2021-12-17 DIAGNOSIS — I120 Hypertensive chronic kidney disease with stage 5 chronic kidney disease or end stage renal disease: Secondary | ICD-10-CM | POA: Diagnosis not present

## 2021-12-17 DIAGNOSIS — R627 Adult failure to thrive: Secondary | ICD-10-CM | POA: Diagnosis not present

## 2021-12-17 DIAGNOSIS — I129 Hypertensive chronic kidney disease with stage 1 through stage 4 chronic kidney disease, or unspecified chronic kidney disease: Secondary | ICD-10-CM | POA: Diagnosis not present

## 2021-12-17 DIAGNOSIS — G309 Alzheimer's disease, unspecified: Secondary | ICD-10-CM | POA: Diagnosis not present

## 2021-12-17 DIAGNOSIS — I272 Pulmonary hypertension, unspecified: Secondary | ICD-10-CM

## 2021-12-17 DIAGNOSIS — N184 Chronic kidney disease, stage 4 (severe): Secondary | ICD-10-CM | POA: Diagnosis not present

## 2021-12-17 DIAGNOSIS — D61818 Other pancytopenia: Secondary | ICD-10-CM | POA: Diagnosis not present

## 2021-12-17 DIAGNOSIS — W19XXXA Unspecified fall, initial encounter: Secondary | ICD-10-CM | POA: Diagnosis not present

## 2021-12-17 LAB — URINALYSIS, ROUTINE W REFLEX MICROSCOPIC
Bacteria, UA: NONE SEEN
Bilirubin Urine: NEGATIVE
Glucose, UA: NEGATIVE mg/dL
Hgb urine dipstick: NEGATIVE
Ketones, ur: NEGATIVE mg/dL
Leukocytes,Ua: NEGATIVE
Nitrite: NEGATIVE
Protein, ur: 100 mg/dL — AB
Specific Gravity, Urine: 1.016 (ref 1.005–1.030)
pH: 6 (ref 5.0–8.0)

## 2021-12-17 LAB — CBC WITH DIFFERENTIAL/PLATELET
Abs Immature Granulocytes: 0.01 10*3/uL (ref 0.00–0.07)
Basophils Absolute: 0 10*3/uL (ref 0.0–0.1)
Basophils Relative: 0 %
Eosinophils Absolute: 0 10*3/uL (ref 0.0–0.5)
Eosinophils Relative: 1 %
HCT: 25.9 % — ABNORMAL LOW (ref 39.0–52.0)
Hemoglobin: 8.5 g/dL — ABNORMAL LOW (ref 13.0–17.0)
Immature Granulocytes: 0 %
Lymphocytes Relative: 22 %
Lymphs Abs: 0.7 10*3/uL (ref 0.7–4.0)
MCH: 31.1 pg (ref 26.0–34.0)
MCHC: 32.8 g/dL (ref 30.0–36.0)
MCV: 94.9 fL (ref 80.0–100.0)
Monocytes Absolute: 0.3 10*3/uL (ref 0.1–1.0)
Monocytes Relative: 10 %
Neutro Abs: 2.2 10*3/uL (ref 1.7–7.7)
Neutrophils Relative %: 67 %
Platelets: 108 10*3/uL — ABNORMAL LOW (ref 150–400)
RBC: 2.73 MIL/uL — ABNORMAL LOW (ref 4.22–5.81)
RDW: 13 % (ref 11.5–15.5)
WBC: 3.3 10*3/uL — ABNORMAL LOW (ref 4.0–10.5)
nRBC: 0 % (ref 0.0–0.2)

## 2021-12-17 LAB — RETICULOCYTES
Immature Retic Fract: 7.5 % (ref 2.3–15.9)
RBC.: 2.75 MIL/uL — ABNORMAL LOW (ref 4.22–5.81)
Retic Count, Absolute: 37.4 10*3/uL (ref 19.0–186.0)
Retic Ct Pct: 1.4 % (ref 0.4–3.1)

## 2021-12-17 LAB — ECHOCARDIOGRAM COMPLETE
Area-P 1/2: 3.72 cm2
Calc EF: 65.6 %
Height: 74 in
S' Lateral: 2.7 cm
Single Plane A2C EF: 67.5 %
Single Plane A4C EF: 60.2 %
Weight: 2384.5 oz

## 2021-12-17 LAB — RENAL FUNCTION PANEL
Albumin: 3.2 g/dL — ABNORMAL LOW (ref 3.5–5.0)
Anion gap: 7 (ref 5–15)
BUN: 61 mg/dL — ABNORMAL HIGH (ref 8–23)
CO2: 21 mmol/L — ABNORMAL LOW (ref 22–32)
Calcium: 9.9 mg/dL (ref 8.9–10.3)
Chloride: 113 mmol/L — ABNORMAL HIGH (ref 98–111)
Creatinine, Ser: 5.82 mg/dL — ABNORMAL HIGH (ref 0.61–1.24)
GFR, Estimated: 9 mL/min — ABNORMAL LOW (ref >60)
Glucose, Bld: 109 mg/dL — ABNORMAL HIGH (ref 70–99)
Phosphorus: 4.1 mg/dL (ref 2.5–4.6)
Potassium: 4.5 mmol/L (ref 3.5–5.1)
Sodium: 141 mmol/L (ref 135–145)

## 2021-12-17 LAB — MAGNESIUM: Magnesium: 2 mg/dL (ref 1.7–2.4)

## 2021-12-17 MED ORDER — HYDRALAZINE HCL 50 MG PO TABS
50.0000 mg | ORAL_TABLET | Freq: Three times a day (TID) | ORAL | Status: DC
  Administered 2021-12-17 – 2021-12-24 (×21): 50 mg via ORAL
  Filled 2021-12-17 (×21): qty 1

## 2021-12-17 MED ORDER — ENSURE ENLIVE PO LIQD
237.0000 mL | Freq: Two times a day (BID) | ORAL | Status: DC
  Administered 2021-12-17 – 2021-12-18 (×4): 237 mL via ORAL

## 2021-12-17 MED ORDER — AMLODIPINE BESYLATE 10 MG PO TABS
10.0000 mg | ORAL_TABLET | Freq: Every day | ORAL | Status: DC
  Administered 2021-12-18 – 2021-12-24 (×7): 10 mg via ORAL
  Filled 2021-12-17 (×7): qty 1

## 2021-12-17 MED ORDER — LACTATED RINGERS IV SOLN: INTRAVENOUS | Status: AC

## 2021-12-17 MED ORDER — HYDRALAZINE HCL 25 MG PO TABS
25.0000 mg | ORAL_TABLET | Freq: Three times a day (TID) | ORAL | Status: DC
  Administered 2021-12-17 (×2): 25 mg via ORAL
  Filled 2021-12-17: qty 1

## 2021-12-17 MED ORDER — POLYETHYLENE GLYCOL 3350 17 G PO PACK
17.0000 g | PACK | Freq: Every day | ORAL | Status: DC
  Administered 2021-12-17 – 2021-12-23 (×7): 17 g via ORAL
  Filled 2021-12-17 (×7): qty 1

## 2021-12-17 NOTE — Progress Notes (Signed)
Family Medicine Teaching Service ?Daily Progress Note ?Intern Pager: [phone] ? ?Patient name: Patrick [name]. Medical record number: [id number] ?Date of birth: Feb 04, 1932 Age: 88 y.o. Gender: male ? ?Primary Care Provider: Steele Sizer, MD ?Consultants: Nephrology, Palliative ?Code Status: DNR ? ?Pt Overview and Major Events to Date:  ?12/16/21: Admitted to [facility]  ? ?Assessment and Plan: ? ?Patrick [name]. is a 88 y.o. male presenting with uncontrolled HTN, previous fall, unstable ambulation, new AKI. PMHx is significant for HTN, CKD, dementia, glaucoma, dyslipidemia, h/o prostate cancer, osteoporosis, protein calorie malnutrition, ACD, pulmonary HTN.  ? ?AKI on CKD stage IV, likely prerenal  ?No signs of obstruction, improvement with fluids (obtaining updated echo with history of severe pulm HTN, see below). Losing appetite with no ability to maintain adequate hydration. Renal U/S with chronic polycystic renal disease, no acute findings.  ?- Ensure assistance with meals  ?- Strict I/Os  ?- Nephrology consulted, appreciate recommendations  ?- Palliative involvement  ?- May need more timed fluids, await echo ?- Hold nephrotoxic agents  ? ?Uncontrolled HTN  ?Patient has had a gradual decrease in blood pressure, this AM 160s-180s/80s-90s. Hydralazine dose increased to 50 TID and continued Amlodipine at 10 mg daily. Continue to slowly decrease pressure.  ?- Continue Hydralazine and Amlodipine as above  ?- Monitor VS  ?- Continuous cardiac monitoring  ? ?Alzheimer's Disease  Falls at home  Difficulty with ambulation  ?Nutritional deficit evident with dehydration, appears difficulty with wife's health makes it difficult to care for Patrick [name]. Likely a component of the cause of his worsening symptoms in conjunction with progression of Alzheimer's. Low likelyhood of stroke, CT negative without focal neurological deficits. B12 and folate ordered for AM to assess for vitamin deficiency.  ?- Ordered orthostatics ?-  Delirium and fall precautions  ?- PT/OT evaluate and treat  ?- further imaging or neuro consult if worsening or neuro exam changes  ?- encourage PO intake  ?- palliative to see  ? ?Severe Pulmonary HTN  ?Updated echocardiogram ordered with previous echo in 06/2021 showing severe pulmonary HTN, moderate TV regurgitation, dilated LA, moderate pericardial effusion, LVEF 60-65%.  ?-- Was ordered VQ scan, PFTs, and sleep study outpatient  ?-- Repeat echo ordered given need for fluids  ? ?Pancytopenia  ?WBC 2.9>3.3, Hgb 10.1>8.9, plt 128>108. Likely bone marrow failure with low reticulocyte count.  ?- Peripheral smear ordered, consider oncologic involvement  ?- await GOC recommendations  ?- AM CBC  ? ?Protein Calorie Malnutrition  Weight Loss  ?Progression of Alzheimer's contributing to lack of appetite.  ?- Assistance with meals  ?- strict I/os  ?- Ensure added  ?- may need fluid resuscitation  ? ?[place]  ?Palliative to see and discuss placement after discharge as well as goals with treatment.  ? ?FEN/GI: Renal diet  ?PPx: Heparin subq ?Dispo:SNF pending clinical improvement . Barriers include further evaluation and palliative assessment.  ? ?Subjective:  ?His is doing well this AM without complaint. No new symptoms.  ? ?Objective: ?Temp:  [97.7 ?F (36.5 ?C)-98.2 ?F (36.8 ?C)] 98 ?F (36.7 ?C) (04/10 0093) ?Pulse Rate:  [57-81] 68 (04/10 8182) ?Resp:  [9-18] 18 (04/10 9937) ?BP: (161-213)/(72-107) 161/81 (04/10 1696) ?SpO2:  [98 %-100 %] 100 % (04/10 7893) ?Weight:  [67.6 kg] 67.6 kg (04/09 1936) ?Physical Exam: ?General: NAD, frail and elderly, pleasant man  ?Cardiovascular: rrr with no murmur/gallops/rubs ?Respiratory: CTAB  ?Abdomen: nttp without distension  ?Extremities: able to move extremities  ?Neuro: unchanged from previous exam, no  new focal deficits, symmetric patellar and triceps reflexes, unable to stabilize self with standing, needs assistance to walk, no foot drop ? ?Laboratory: ?Recent Labs  ?Lab  12/16/21 ?1433 12/17/21 ?0451  ?WBC 2.9* 3.3*  ?HGB 10.1* 8.5*  ?HCT 30.3* 25.9*  ?PLT 128* 108*  ? ?Recent Labs  ?Lab 12/16/21 ?1433 12/17/21 ?0451  ?NA 139 141  ?K 5.0 4.5  ?CL 109 113*  ?CO2 21* 21*  ?BUN 68* 61*  ?CREATININE 6.45* 5.82*  ?CALCIUM 10.3 9.9  ?PROT 7.7  --   ?BILITOT 0.7  --   ?ALKPHOS 54  --   ?ALT 14  --   ?AST 11*  --   ?GLUCOSE 104* 109*  ? ? ? ? ?Imaging/Diagnostic Tests: ?DG Chest 2 View ? ?Result Date: 12/16/2021 ?CLINICAL DATA:  Weakness with nausea and vomiting. EXAM: CHEST - 2 VIEW COMPARISON:  August 17, 2019 FINDINGS: The heart size and mediastinal contours are within normal limits. Both lungs are clear. The visualized skeletal structures are unremarkable. IMPRESSION: No active cardiopulmonary disease. Electronically Signed   By: Virgina Norfolk M.D.   On: 12/16/2021 18:59  ? ?CT Head Wo Contrast ? ?Result Date: 12/16/2021 ?IMPRESSION: 1. No acute intracranial pathology. Small-vessel white matter disease. 2. No fracture or static subluxation of the cervical spine. 3. Moderate multilevel cervical disc degenerative disease.  ? ?CT Cervical Spine Wo Contrast ? ?Result Date: 12/16/2021 ?IMPRESSION: 1. No acute intracranial pathology. Small-vessel white matter disease. 2. No fracture or static subluxation of the cervical spine. 3. Moderate multilevel cervical disc degenerative disease.  ? ?US RENAL ? ?Result Date: 12/17/2021 ?IMPRESSION: 1. Chronic polycystic renal disease, with progression since a 2011 CT Abdomen and Pelvis, and underlying chronic medical renal disease with atrophied and echogenic renal cortex. 2. No obstructive uropathy.  Unremarkable bladder. ? ?Erskine Emery, MD ?12/17/2021, 12:11 PM ?PGY-1, [place] ?[place] Intern pager: [phone], text pages welcome ? ?

## 2021-12-17 NOTE — Hospital Course (Addendum)
Patrick [name]. is a 88 y.o. male who was admitted to [place] at [facility] for difficulty with ambulation and falls at home.  Past medical history significant for hypertension, CKD, dementia, glaucoma, dyslipidemia, history of prostate cancer, osteoporosis, protein calorie malnutrition, anemia of chronic disease. Hospital course is outlined below (See H&P for more details).  ? ?Brief hospital course: ?Patient was initially admitted due to worsening instability with ambulation and recurrent falls. Acute work-up including head imaging was negative and was felt to be multifactorial with dementia progression. An echocardiogram had also been obtained and did show pulmonary hypertension. His labs were significant for worsening renal function and was intermittently hyperkalemic requiring intermittent dosing of Lokelma. Nephrology evaluated the patient and patient was not a good candidate for HD. Wife was requesting full scope care at that time, palliative discussions were initiated with the persistent electrolyte issues present. On 4/17, patient's hemoglobin was down-trending and was not improving despite 3 Units of PRBC being transfused. GI was consulted with recommendations for abdominal imaging. CT abdomen/pelvis showed bilateral retroperitoneal hematomas involving the iliopsoas muscles, dilated pancreatic duct, and pulmonary nodules. Given the significant anemia and the retroperitoneal hematomas, the prognosis is poor. Palliative had further discussions with the family and the goal became comfort care.  ? ?Alzheimer's disease falls at home instability ?Wife presented with patient initially for complaint of instability with ambulation and falls at home that was worse than normal.  This is likely multifactorial in the setting of nutritional deficiency and Alzheimer's progression.  No evidence of focal neurologic deficits to indicate new onset stroke, CT head ruled out hemorrhage.  ? ?Goals of care protein calorie  malnutrition ?Palliative consulted and there was a decision to pursue symptom management.  Wife was spoken to, and she understood that the patient has poor prognosis and ultimately decided on hospice care. ? ?Items for follow-up: ?Discharge to hospice ? ?

## 2021-12-17 NOTE — Care Management Obs Status (Signed)
MEDICARE OBSERVATION STATUS NOTIFICATION ? ? ?Patient Details  ?Name: Patrick [name]. ?MRN: [id number] ?Date of Birth: 1932/05/17 ? ? ?Medicare Observation Status Notification Given:  Yes ? ? ? ?Tom-Johnson, Renea Ee, RN ?12/17/2021, 3:29 PM ?

## 2021-12-17 NOTE — Progress Notes (Addendum)
FPTS Night Rounding Progress Note ? ?S:Went to bedside, patient asleep. I did not wake the patient. Discussed with nurse who denies any concerns.  ? ?O: ?BP (!) 163/92 (BP Location: Right Arm)   Pulse 80   Temp 97.8 ?F (36.6 ?C) (Oral)   Resp 15   Ht '6\' 2"'$  (1.88 m)   Wt 67.6 kg   SpO2 98%   BMI 19.13 kg/m?   ?General: Patient resting comfortably, in no acute distress. ?Resp: normal work of breathing ? ?A/P: ?Patient admitted for uncontrolled hypertension. Most recently BP has been improved in the 412I systolic. Received IV hydralazine 10 mg. Resumed home hydralazine 25 mg tid and amlodipine 10 mg daily. Continue to monitor BP closely. Renal ultrasound ordered to assess for significantly worsening Cr. Vitals and orders reviewed. Remainder of plan per day team.  ? Donney Dice, DO ?12/17/2021, 1:23 AM ?PGY-2, [place] ?Service pager [phone]  ?

## 2021-12-17 NOTE — Consult Note (Addendum)
? ?Palliative Care Consult Note  ?                                ?Date: 12/17/2021  ? ?Patient Name: Patrick [name].  ?DOB: [birth year]  MRN: [id number]  Age / Sex: 88 y.o., male  ?PCP: Steele Sizer, MD ?Referring Physician: Martyn Malay, MD ? ?Reason for Consultation: Establishing goals of care ? ?HPI/Patient Profile: 88 y.o. male  with past medical history of dementia, CKD stage IV, protein calorie malnutrition, history of prostate cancer, osteoporosis, hypertension, and hyperlipidemia who presented to the emergency department on 12/16/2021 with fall at home and unstable ambulation.  Found to have creatinine of 6.45.  Blood pressure in the ED 180-210/77-100.  He was admitted to [facility] with acute on chronic kidney failure and uncontrolled hypertension. ? ? ?Past Medical History:  ?Diagnosis Date  ? Anemia   ? Arthritis   ? Bradycardia   ? CKD (chronic kidney disease), stage IV ([place])   ? GERD (gastroesophageal reflux disease)   ? High cholesterol   ? Hypertension   ? IBS (irritable bowel syndrome)   ? Pneumonia   ? Prostate cancer ([place])   ? Pulmonary HTN ([place]) 11/05/2015  ? 27mHg on echo 2017  ? PVD (peripheral vascular disease) ([place]   ? Syncope 07/2018  ? ? ?Subjective:  ? ?I have reviewed medical records including EPIC notes, labs and imaging, and went to see patient at bedside.  He is out of bed to the recliner. He is oriented to self only.  He has no acute complaints.  Bedside RN reports that patient only ate a few bites of his breakfast tray this morning.  ? ?I spoke with his wife CAltha Harmby phone to discuss diagnosis, prognosis, [place] E[place] wishes, disposition, and options.  ?Patient is active with outpatient palliative services with a [facility].  Last home visit was 11/01/2021.   ?I re-introduced Palliative Medicine as specialized medical care for people living with serious illness. It focuses on providing relief from the symptoms and stress of a serious  illness.  ? ?Life Review: ?HCostantinoand CAltha Harmhave been married for 65 years.  He is retired from work as a mDealer  [place]  [place] 1 daughter [place]  [place] ? ?Functional status: ?HAlirezaand [place]tha [place]  Prior to his fall 2 days ago, he was ambulatory without assistance.  He was independent with ADLs.  CAltha Harmreports that his decline over the last week is a drastic change from his baseline functional status.  However she does report that his nutritional status has been declining over the past year.  She confirms that he does not eat very much at home and that he has had a 50 pound weight loss over the past year. ? ?Today's Discussion: ?We discussed his current illness and what it means in the larger context of his ongoing co-morbidities.  Christine verbalizes understanding that HAnashas chronic kidney disease.  Provided education on the natural trajectory of chronic illness, emphasizing that it is non-curable and progressive. Discussed that chronic illness can result in decreased functional status over time, as patients may not return to previous baseline after an illness. ? ?We discussed that creatinine has improved  to 5.82 today.  Discussed that per nephrology, patient's CKD is likely multifactorial and may be due to possible dehydration as well as notably large polycystic kidneys on ultrasound.  Discussed that per nephrology there is currently no indication for dialysis.  Altha Harm understands that he has a fistula in his left arm due to possible need for dialysis in the future.  She is unsure whether he would want to proceed with dialysis if needed; she states they have not had this conversation yet. ? ?We reviewed that dementia is a progressive, non-curable disease underlying the patient's current acute medical  conditions. We reviewed specific indicators of advanced dementia, such as decreased ability to communicate, decreased mobility, decreased oral intake, and incontinence of bowel/bladder. ? ?Altha Harm shares that she has "2 pinched nerves" in her spine and is due to have surgery for 24.  She is currently depending on friends and family for help with meals and household task.  She reports she has a good support system in place.  After surgery, she has been told that her recovery will take 6 to 8 weeks. ? ?Despite her own health problems, Christine's goal is for [facility] to improve and return home.  Discussed that current recommendation from PT/OT was for home health and home PT services.  She does mention wanting Dmitri to go to rehab while she is recovering from surgery.   ? ?The difference between aggressive medical intervention and comfort care was considered in light of the patient's goals of care. Hospice and Palliative Care services outpatient were reviewed and offered. ? ?At this point, Altha Harm wishes to continue all current interventions and is hopeful for improvement. Discussed the importance of continued conversation with family and the medical team regarding overall plan of care and treatment options, ensuring decisions are within the context of the patients values and GOCs. ? ? ?Review of Systems  ?Unable to perform ROS: Dementia  ? ?Objective:  ? ?Primary Diagnoses: ?Present on Admission: ? HTN (hypertension) ? ? ?Physical Exam ?Vitals reviewed.  ?Constitutional:   ?   General: He is not in acute distress. ?   Appearance: He is ill-appearing.  ?Pulmonary:  ?   Effort: Pulmonary effort is normal.  ?Neurological:  ?   Mental Status: He is alert.  ?   Comments: Oriented to self  ?Psychiatric:     ?   Cognition and Memory: Cognition is impaired. Memory is impaired.  ? ? ?Vital Signs:  ?BP (!) 161/81   Pulse 68   Temp 98 ?F (36.7 ?C)   Resp 18   Ht '6\' 2"'$  (1.88 m)   Wt 67.6 kg   SpO2 100%   BMI 19.13  kg/m?  ? ?Palliative Assessment/Data: PPS 40% ? ? ? ? ?Assessment & Plan:  ? ?SUMMARY OF RECOMMENDATIONS   ?DNR/DNI as previously documented ?Continue all current interventions with watchful waiting ?Wife is hopeful for improvement and her goal is for patient to return home ?PMT will continue to follow ?Continue outpatient palliative at discharge ? ?Primary Decision Maker: ?NEXT OF KIN, wife Altha Harm ? ?Prognosis:  ?Unable to determine ? ?Discharge Planning:  ?To Be Determined  ? ? ?Thank you for allowing [place] to participate in the care of [place]. ? ?Time Total: 75 minutes ? ?Greater than 50%  of this time was spent counseling and coordinating care related to the above assessment and plan. ? ?Signed by: ?Elie Confer, NP ?Palliative Medicine Team ? ?Team Phone # [phone] (Nights/Weekends) ? ? ? ? ? ? ? ? ? ? ? ? ? ? ? ? ?

## 2021-12-17 NOTE — Progress Notes (Signed)
?  Echocardiogram ?2D Echocardiogram has been performed. ? ?Patrick [name] ?12/17/2021, 2:57 PM ?

## 2021-12-17 NOTE — Progress Notes (Addendum)
Occupational Therapy Evaluation ?Patient Details ?Name: Patrick [name]. ?MRN: [id number] ?DOB: 1932/07/20 ?Today's Date: 12/17/2021 ? ? ?History of Present Illness 88 y.o. male presenting with uncontrolled HTN, previous fall, unstable ambulation. PMHx is significant for HTN, CKD, dementia, glaucoma, dyslipidemia, h/o prostate cancer, osteoporosis, protein calorie malnutrition, ACD.  ? ?Clinical Impression ?  ?Pt oriented only to self and had at least 2 falls PTA. Able to mobilize with Min A a very short distance, @ 10, ft before reaching for the seat to sit down. Currently requires mod A with ADL tasks.  Pt had an untouched tray, however pt states he is "full and "can't eat anymore". BP sitting 153/86; standing 131/71; after activity 131/70. Pt demonstrates a decline in function and will need direct assistance with all mobility and ADL in order to safely DC home, most likely at wc level due to level of weakness. If wife is not able to provide assistance, recommend placement at SNF. Acute OT to follow.  ?   ? ?Recommendations for follow up therapy are one component of a multi-disciplinary discharge planning process, led by the attending physician.  Recommendations may be updated based on patient status, additional functional criteria and insurance authorization.  ? ?Follow Up Recommendations ? Skilled nursing-short term rehab (<3 hours/day) unless wife has additional assistance at home ?  ?Assistance Recommended at Discharge Frequent or constant Supervision/Assistance  ?Patient can return home with the following A lot of help with walking and/or transfers;A lot of help with bathing/dressing/bathroom;Assistance with feeding;Assistance with cooking/housework;Direct supervision/assist for medications management;Assist for transportation;Direct supervision/assist for financial management;Help with stairs or ramp for entrance ? ?  ?Functional Status Assessment ? Patient has had a recent decline in their functional  status and/or demonstrates limited ability to make significant improvements in function in a reasonable and predictable amount of time  ?Equipment Recommendations ? Wheelchair (measurements OT) (if DC home)  ?  ?Recommendations for Other Services   ? ? ?  ?Precautions / Restrictions Precautions ?Precautions: Fall  ? ?  ? ?Mobility Bed Mobility ?Overal bed mobility: Needs Assistance ?Bed Mobility: Supine to Sit ?  ?  ?Supine to sit: Min guard ?  ?  ?  ?  ? ?Transfers ?Overall transfer level: Needs assistance ?  ?Transfers: Sit to/from Stand ?Sit to Stand: Min assist ?  ?  ?  ?  ?  ?  ?  ? ?  ?Balance Overall balance assessment: Needs assistance, History of Falls ?  ?Sitting balance-Leahy Scale: Fair ?  ?  ?  ?Standing balance-Leahy Scale: Poor ?  ?  ?  ?  ?  ?  ?  ?  ?  ?  ?  ?  ?   ? ?ADL either performed or assessed with clinical judgement  ? ?ADL Overall ADL's : Needs assistance/impaired ?  ?  ?  ?  ?  ?  ?  ?  ?  ?  ?  ?  ?  ?  ?  ?  ?  ?  ?Functional mobility during ADLs: Minimal assistance;Cueing for safety ?General ADL Comments: Mod A for all ADL tasks; poor PO intake; pt states he was full however none of his food had been touched  ? ? ? ?Vision Baseline Vision/History: 3 Glaucoma (? blind L eye) ?   ?   ?Perception   ?  ?Praxis   ?  ? ?Pertinent Vitals/Pain Pain Assessment ?Pain Assessment: Faces ?Pain Score: 0-No pain  ? ? ? ?Hand Dominance Right ?  ?  Extremity/Trunk Assessment Upper Extremity Assessment ?Upper Extremity Assessment: Generalized weakness ?  ?Lower Extremity Assessment ?Lower Extremity Assessment: Generalized weakness ?  ?Cervical / Trunk Assessment ?Cervical / Trunk Assessment: Kyphotic ?  ?Communication Communication ?Communication: No difficulties ?  ?Cognition Arousal/Alertness: Awake/alert ?Behavior During Therapy: Flat affect ?Overall Cognitive Status: No family/caregiver present to determine baseline cognitive functioning (hx of dementia; orineted to self only; difficulty sustaining  attention to task; tangential) ?  ?  ?  ?  ?  ?  ?  ?  ?  ?  ?  ?  ?  ?  ?  ?  ?  ?  ?  ?General Comments  appears orthostatic ? ?  ?Exercises   ?  ?Shoulder Instructions    ? ? ?Home Living Family/patient expects to be discharged to:: Private residence ?Living Arrangements: Spouse/significant other ?Available Help at Discharge: Available 24 hours/day ?Type of Home: House ?Home Access: Stairs to enter ?Entrance Stairs-Number of Steps: 3 ?  ?Home Layout: One level ?  ?  ?Bathroom Shower/Tub: Walk-in shower ?  ?  ?  ?  ?Home Equipment: Shower seat ?  ?Additional Comments: unreliable historian ?  ? ?  ?Prior Functioning/Environment Prior Level of Function : Patient poor historian/Family not available (pr reportts independent; had 2 falls PTA and pt states he has not fallen; poor PO intake PTA) ?  ?  ?  ?  ?  ?  ?  ?  ?  ? ?  ?  ?OT Problem List: Decreased strength;Decreased activity tolerance;Impaired balance (sitting and/or standing);Decreased cognition;Decreased safety awareness ?  ?   ?OT Treatment/Interventions: Self-care/ADL training;Therapeutic exercise;DME and/or AE instruction;Therapeutic activities;Cognitive remediation/compensation;Patient/family education;Other (comment)  ?  ?OT Goals(Current goals can be found in the care plan section) Acute Rehab OT Goals ?Patient Stated Goal: to get comfortable` ?OT Goal Formulation: Patient unable to participate in goal setting ?Time For Goal Achievement: 12/31/21 ?Potential to Achieve Goals: Fair  ?OT Frequency: Min 2X/week ?  ? ?Co-evaluation   ?  ?  ?  ?  ? ?  ?AM-PAC OT "6 Clicks" Daily Activity     ?Outcome Measure Help from another person eating meals?: A Little ?Help from another person taking care of personal grooming?: A Little ?Help from another person toileting, which includes using toliet, bedpan, or urinal?: A Lot ?Help from another person bathing (including washing, rinsing, drying)?: A Lot ?Help from another person to put on and taking off regular upper  body clothing?: A Little ?Help from another person to put on and taking off regular lower body clothing?: A Lot ?6 Click Score: 15 ?  ?End of Session Equipment Utilized During Treatment: Gait belt ?Nurse Communication: Mobility status ? ?Activity Tolerance: Patient tolerated treatment well ?Patient left: in chair;with call bell/phone within reach;with chair alarm set ? ?OT Visit Diagnosis: Unsteadiness on feet (R26.81);Other abnormalities of gait and mobility (R26.89);Muscle weakness (generalized) (M62.81);History of falling (Z91.81);Other symptoms and signs involving cognitive function  ?              ?Time: 1027-1050 ?OT Time Calculation (min): 23 min ?Charges:  OT General Charges ?$OT Visit: 1 Visit ?OT Evaluation ?$OT Eval Moderate Complexity: 1 Mod ? ?[facility], OT/L  ? ?Acute OT Clinical Specialist ?Acute Rehabilitation Services ?Pager [phone] ?Office [phone]  ? ?Tynetta Bachmann,HILLARY ?12/17/2021, 11:02 AM ?

## 2021-12-17 NOTE — Consult Note (Signed)
Nephrology Consult  ? ?Requesting provider: Dorris Singh ?Service requesting consult: family medicine ?Reason for consult: AKI on CKD IV ? ? ?Assessment/Recommendations: Patrick [name]. is a/an 88 y.o. male with a past medical history HTN, CKD IV, dementia, malnutrition who present w/ fall and FTT c/b AKI  ? ?AKI on CKD IV: CKD multifactorial but notably large polycystic kidneys on RUS. AKI likely non-oliguric. Maybe some dehydration and improving with supportive care alone. No obvious uremic signs and more c/w baseline dementia ?-Continue to monitor daily Cr, Dose meds for GFR ?-Monitor Daily I/Os, Daily weight  ?-Maintain MAP>65 for optimal renal perfusion.  ?-Avoid nephrotoxic medications including NSAIDs ?-Use synthetic opioids (Fentanyl/Dilaudid) if needed ?-Currently no indication for HD given minimal to no uremia and Crt improving ?-The patient has FTT and I think his candidacy for dialysis is deteriorating. He may benefit from palliative care consulting ?-Obtian UA ? ?Fall/FTT: Significant weight loss in the outpatient setting.  Patient appears to be declining.  May benefit from palliative care.  Management per primary team ? ?Hypertension: Worsened in the setting of noncompliance.  Continue current medications ideally with more slow improvements ? ?Pancytopenia: Anemia likely multifactorial but unclear cause of leukopenia and thrombocytopenia.  Management per primary.  With his weight loss I think underlying malignancy would be a concern ? ? ?Recommendations conveyed to primary service.  ? ? ?Reesa Chew ?[facility] ?12/17/2021 ?10:12 AM ? ? ?_____________________________________________________________________________________ ?CC: Fall ? ?History of Present Illness: Patrick [name]. is a/an 88 y.o. male with a past medical history of HTN, CKD IV, dementia, malnutrition who presents with difficulties ambulating and fall. ? ?The patient has dementia and therefore has a difficult  time providing history.  History predominantly obtained per chart review. ? ?Patient was brought to the emergency department yesterday by his wife because he fell at home.  He did not hit his head or lose consciousness.  She noted that he had been stumbling and having a hard time walking prior to the fall.  He also had an episode of the day he came in.  She notes that he has been acting less like himself for several days prior to presentation.  It was also noted that he was refusing his medications for a couple days prior to coming to the hospital.  He had a very difficult time eating for the past couple years and sometimes does not like to take his medications.  She noted 60 pound weight loss over the past 6 months. ? ?Patient's creatinine was noted to be 6.5 on arrival and is 5.8 today.  Patient's baseline is around 3.7.  Patient has had significant hypertension in the setting of not taking his medications.  Patient has followed with [facility].  He has fistula.  Has not had an acute need to start dialysis thus far. ? ? ?Medications:  ?Current Facility-Administered Medications  ?Medication Dose Route Frequency Provider Last Rate Last Admin  ? [START ON 12/18/2021] amLODipine (NORVASC) tablet 10 mg  10 mg Oral Daily Espinoza, Alejandra, DO      ? atorvastatin (LIPITOR) tablet 40 mg  40 mg Oral QPM Maxwell, Allee, MD      ? feeding supplement (ENSURE ENLIVE / ENSURE PLUS) liquid 237 mL  237 mL Oral BID BM Maxwell, Allee, MD      ? heparin injection 5,000 Units  5,000 Units Subcutaneous Q8H Maxwell, Allee, MD      ? hydrALAZINE (APRESOLINE) tablet 25 mg  25  mg Oral TID Eppie Gibson, MD   25 mg at 12/17/21 8938  ?  ? ?ALLERGIES ?Ace inhibitors and Aricept [donepezil] ? ?MEDICAL HISTORY ?Past Medical History:  ?Diagnosis Date  ? Anemia   ? Arthritis   ? Bradycardia   ? CKD (chronic kidney disease), stage IV ([place])   ? GERD (gastroesophageal reflux disease)   ? High cholesterol   ? Hypertension   ? IBS  (irritable bowel syndrome)   ? Pneumonia   ? Prostate cancer ([place])   ? Pulmonary HTN ([place]) 11/05/2015  ? 80mHg on echo 2017  ? PVD [place]ipheral vascular disease) [place]   ? Syncope 07/2018  ?  ? ?SOCIAL HISTORY ?Social History  ? ?Socioeconomic History  ? Marital status: Married  ?  Spouse name: CAltha Harm ? Number of children: 1  ? Years of education: Not on file  ? Highest education level: 6th grade  ?Occupational History  ? Occupation: Retired   ?Tobacco Use  ? Smoking status: Former  ?  Packs/day: 0.30  ?  Years: 20.00  ?  Pack years: 6.00  ?  Types: Cigarettes  ?  Quit date: 08/26/1978  ?  Years since quitting: 43.3  ? Smokeless tobacco: Never  ? Tobacco comments:  ?  smoking cessation materials not required  ?Vaping Use  ? Vaping Use: Never used  ?Substance and Sexual Activity  ? Alcohol use: No  ?  Alcohol/week: 0.0 standard drinks  ? Drug use: No  ? Sexual activity: Not Currently  ?Other Topics Concern  ? Not on file  ?Social History Narrative  ? Lives w/ wife  ? Caffeine use: none  ? Right handed   ? He retired 07/2018 because of medical problems  ? ?Social Determinants of Health  ? ?Financial Resource Strain: Low Risk   ? Difficulty of Paying Living Expenses: Not hard at all  ?Food Insecurity: No Food Insecurity  ? Worried About RCharity fundraiserin the Last Year: Never true  ? Ran Out of Food in the Last Year: Never true  ?Transportation Needs: No Transportation Needs  ? Lack of Transportation (Medical): No  ? Lack of Transportation (Non-Medical): No  ?Physical Activity: Inactive  ? Days of Exercise per Week: 0 days  ? Minutes of Exercise per Session: 0 min  ?Stress: No Stress Concern Present  ? Feeling of Stress : Not at all  ?Social Connections: Moderately Integrated  ? Frequency of Communication with Friends and Family: More than three times a week  ? Frequency of Social Gatherings with Friends and Family: Never  ? Attends Religious Services: More than 4 times per year  ? Active Member of Clubs or  Organizations: No  ? Attends CArchivistMeetings: Never  ? Marital Status: Married  ?Intimate Partner Violence: Not At Risk  ? Fear of Current or Ex-Partner: No  ? Emotionally Abused: No  ? Physically Abused: No  ? Sexually Abused: No  ?  ? ?FAMILY HISTORY ?Family History  ?Problem Relation Age of Onset  ? CVA Mother   ? Hypertension Mother   ? Dementia Mother   ? Heart attack Father   ? Heart disease Father   ?     before age 86 ? Hypertension Father   ? Hepatitis C Sister   ? Dementia Sister   ?  ? ? ?Review of Systems: ?12 systems reviewed ?Otherwise as per HPI, all other systems reviewed and negative ? ?Physical Exam: ?Vitals:  ?  12/17/21 5625 12/17/21 6389  ?BP: (!) 188/90 (!) 161/81  ?Pulse:  68  ?Resp:  18  ?Temp:  98 ?F (36.7 ?C)  ?SpO2:  100%  ? ?No intake/output data recorded. ? ?Intake/Output Summary (Last 24 hours) at 12/17/2021 1012 ?Last data filed at 12/17/2021 0200 ?Gross per 24 hour  ?Intake 647.66 ml  ?Output --  ?Net 647.66 ml  ? ?General: Chronically ill-appearing, thin, no distress ?HEENT: anicteric sclera, oropharynx clear without lesions ?CV: Normal rate, no rub ?Lungs: clear to auscultation bilaterally, normal work of breathing ?Abd: soft, non-tender, non-distended ?Skin: no visible lesions or rashes ?Psych: alert, engaged, appropriate mood and affect ?Musculoskeletal: no obvious deformities ?Neuro: normal speech, oriented to person but not place, time, situation ? ?Test Results ?Reviewed ?Lab Results  ?Component Value Date  ? NA 141 12/17/2021  ? K 4.5 12/17/2021  ? CL 113 (H) 12/17/2021  ? CO2 21 (L) 12/17/2021  ? BUN 61 (H) 12/17/2021  ? CREATININE 5.82 (H) 12/17/2021  ? CALCIUM 9.9 12/17/2021  ? ALBUMIN 3.2 (L) 12/17/2021  ? PHOS 4.1 12/17/2021  ? ? ?CBC ?Recent Labs  ?Lab 12/16/21 ?1433 12/17/21 ?0451  ?WBC 2.9* 3.3*  ?NEUTROABS 1.9 2.2  ?HGB 10.1* 8.5*  ?HCT 30.3* 25.9*  ?MCV 95.6 94.9  ?PLT 128* 108*  ? ? ?I have reviewed all relevant outside healthcare records related to the  patient's current hospitalization ? ?

## 2021-12-17 NOTE — Evaluation (Signed)
Physical Therapy Evaluation ?Patient Details ?Name: Patrick [name]. ?MRN: [id number] ?DOB: Feb 20, 1932 ?Today's Date: 12/17/2021 ? ?History of Present Illness ? 88 y.o. male presenting with uncontrolled HTN, previous fall, unstable ambulation. PMHx is significant for HTN, CKD, dementia, glaucoma, dyslipidemia, h/o prostate cancer, osteoporosis, protein calorie malnutrition, ACD.  ?Clinical Impression ? Pt presents to PT from home where he has had several recent falls. With PT pt able to amb in hallway with min guard assist for safety. Tried using a rolling walker which was unfamiliar to pt and he needed cues to use. Unsure if he would use a walker effectively at home. He also amb some distance without any device and gait was not dramatically different. With pt's cognitive deficits and balance deficits he will always be a fall risk in any setting. Will try a rollator on next visit to see if it improves stability. Feel pt can return home if wife can provide some supervision/min guard with mobility. If wife unable to care for pt may need to look at other options.    ?   ? ?Recommendations for follow up therapy are one component of a multi-disciplinary discharge planning process, led by the attending physician.  Recommendations may be updated based on patient status, additional functional criteria and insurance authorization. ? ?Follow Up Recommendations Home health PT ? ?  ?Assistance Recommended at Discharge Frequent or constant Supervision/Assistance  ?Patient can return home with the following ? A little help with walking and/or transfers;Help with stairs or ramp for entrance;Assist for transportation ? ?  ?Equipment Recommendations Rollator (4 wheels)  ?Recommendations for Other Services ?    ?  ?Functional Status Assessment Patient has had a recent decline in their functional status and/or demonstrates limited ability to make significant improvements in function in a reasonable and predictable amount of time  ? ?   ?Precautions / Restrictions Precautions ?Precautions: Fall  ? ?  ? ?Mobility ? Bed Mobility ?Overal bed mobility: Needs Assistance ?Bed Mobility: Supine to Sit, Sit to Supine ?  ?  ?Supine to sit: Min guard ?Sit to supine: Min guard ?  ?General bed mobility comments: Verbal cues to stay on task and assist for safety ?  ? ?Transfers ?Overall transfer level: Needs assistance ?Equipment used: None, Rolling walker (2 wheels) ?Transfers: Sit to/from Stand ?Sit to Stand: Min guard ?  ?  ?  ?  ?  ?General transfer comment: Assist for safety and incr time to rise ?  ? ?Ambulation/Gait ?Ambulation/Gait assistance: Min guard ?Gait Distance (Feet): 200 Feet ?Assistive device: Rolling walker (2 wheels), None ?Gait Pattern/deviations: Step-through pattern, Decreased step length - right, Decreased step length - left, Shuffle, Trunk flexed ?Gait velocity: decr ?Gait velocity interpretation: <1.8 ft/sec, indicate of risk for recurrent falls ?  ?General Gait Details: Assist for safety and verbal cues for correct use of walker ? ?Stairs ?  ?  ?  ?  ?  ? ?Wheelchair Mobility ?  ? ?Modified Rankin (Stroke Patients Only) ?  ? ?  ? ?Balance Overall balance assessment: Needs assistance, History of Falls ?Sitting-balance support: No upper extremity supported ?Sitting balance-Leahy Scale: Good ?  ?  ?Standing balance support: No upper extremity supported, During functional activity ?Standing balance-Leahy Scale: Fair ?Standing balance comment: static standing without support ?  ?  ?  ?  ?  ?  ?  ?  ?  ?  ?  ?   ? ? ? ?Pertinent Vitals/Pain Pain Assessment ?Pain Assessment: No/denies pain  ? ? ?  Home Living Family/patient expects to be discharged to:: Private residence ?Living Arrangements: Spouse/significant other ?Available Help at Discharge: Available 24 hours/day ?Type of Home: House ?Home Access: Stairs to enter ?  ?Entrance Stairs-Number of Steps: 3 ?  ?Home Layout: One level ?Home Equipment: Shower seat ?Additional Comments:  unreliable historian  ?  ?Prior Function Prior Level of Function : Patient poor historian/Family not available ?  ?  ?  ?  ?  ?  ?Mobility Comments: Per visitor pt ambulates without assistive device but has had a couple of falls lately ?  ?  ? ? ?Hand Dominance  ? Dominant Hand: Right ? ?  ?Extremity/Trunk Assessment  ? Upper Extremity Assessment ?Upper Extremity Assessment: Defer to OT evaluation ?  ? ?Lower Extremity Assessment ?Lower Extremity Assessment: Generalized weakness ?  ? ?Cervical / Trunk Assessment ?Cervical / Trunk Assessment: Kyphotic  ?Communication  ? Communication: No difficulties  ?Cognition Arousal/Alertness: Awake/alert ?Behavior During Therapy: Flat affect ?Overall Cognitive Status: No family/caregiver present to determine baseline cognitive functioning ?  ?  ?  ?  ?  ?  ?  ?  ?  ?  ?  ?  ?  ?  ?  ?  ?General Comments: hx of dementia; orineted to self only; difficulty sustaining attention to task; tangential. Pt perseverating on thinking he saw his wife earlier across the hall ?  ?  ? ?  ?General Comments   ? ?  ?Exercises    ? ?Assessment/Plan  ?  ?PT Assessment Patient needs continued PT services  ?PT Problem List Decreased strength;Decreased activity tolerance;Decreased balance;Decreased mobility;Decreased cognition;Decreased safety awareness ? ?   ?  ?PT Treatment Interventions DME instruction;Gait training;Functional mobility training;Stair training;Therapeutic activities;Therapeutic exercise;Balance training;Cognitive remediation;Patient/family education   ? ?PT Goals (Current goals can be found in the Care Plan section)  ?Acute Rehab PT Goals ?Patient Stated Goal: Pt didn't state ?PT Goal Formulation: With patient ?Time For Goal Achievement: 12/31/21 ?Potential to Achieve Goals: Good ? ?  ?Frequency Min 3X/week ?  ? ? ?Co-evaluation   ?  ?  ?  ?  ? ? ?  ?AM-PAC PT "6 Clicks" Mobility  ?Outcome Measure Help needed turning from your back to your side while in a flat bed without using  bedrails?: None ?Help needed moving from lying on your back to sitting on the side of a flat bed without using bedrails?: A Little ?Help needed moving to and from a bed to a chair (including a wheelchair)?: A Little ?Help needed standing up from a chair using your arms (e.g., wheelchair or bedside chair)?: A Little ?Help needed to walk in hospital room?: A Little ?Help needed climbing 3-5 steps with a railing? : A Little ?6 Click Score: 19 ? ?  ?End of Session Equipment Utilized During Treatment: Gait belt ?Activity Tolerance: Patient tolerated treatment well ?Patient left: in bed;with call bell/phone within reach;with bed alarm set;Other (comment) (vascular in room) ?Nurse Communication: Mobility status ?PT Visit Diagnosis: Other abnormalities of gait and mobility (R26.89);Muscle weakness (generalized) (M62.81);History of falling (Z91.81) ?  ? ?Time: 1194-1740 ?PT Time Calculation (min) (ACUTE ONLY): 19 min ? ? ?Charges:   PT Evaluation ?$PT Eval Moderate Complexity: 1 Mod ?  ?  ?   ? ? ?[facility] ?Acute Rehabilitation Services ?Office [phone] ? ? ?Shary Decamp [facility] ?12/17/2021, 4:05 PM ? ?

## 2021-12-18 DIAGNOSIS — I151 Hypertension secondary to other renal disorders: Secondary | ICD-10-CM | POA: Diagnosis not present

## 2021-12-18 DIAGNOSIS — I161 Hypertensive emergency: Secondary | ICD-10-CM | POA: Diagnosis not present

## 2021-12-18 DIAGNOSIS — E43 Unspecified severe protein-calorie malnutrition: Secondary | ICD-10-CM | POA: Diagnosis not present

## 2021-12-18 DIAGNOSIS — I10 Essential (primary) hypertension: Secondary | ICD-10-CM | POA: Diagnosis not present

## 2021-12-18 DIAGNOSIS — W19XXXA Unspecified fall, initial encounter: Secondary | ICD-10-CM | POA: Diagnosis not present

## 2021-12-18 DIAGNOSIS — F0390 Unspecified dementia without behavioral disturbance: Secondary | ICD-10-CM | POA: Diagnosis not present

## 2021-12-18 DIAGNOSIS — Z7189 Other specified counseling: Secondary | ICD-10-CM | POA: Diagnosis not present

## 2021-12-18 DIAGNOSIS — D61818 Other pancytopenia: Secondary | ICD-10-CM | POA: Diagnosis not present

## 2021-12-18 DIAGNOSIS — N184 Chronic kidney disease, stage 4 (severe): Secondary | ICD-10-CM | POA: Diagnosis not present

## 2021-12-18 DIAGNOSIS — Z515 Encounter for palliative care: Secondary | ICD-10-CM | POA: Diagnosis not present

## 2021-12-18 DIAGNOSIS — N2889 Other specified disorders of kidney and ureter: Secondary | ICD-10-CM | POA: Diagnosis not present

## 2021-12-18 DIAGNOSIS — Z66 Do not resuscitate: Secondary | ICD-10-CM | POA: Diagnosis not present

## 2021-12-18 DIAGNOSIS — N179 Acute kidney failure, unspecified: Secondary | ICD-10-CM | POA: Diagnosis not present

## 2021-12-18 DIAGNOSIS — N185 Chronic kidney disease, stage 5: Secondary | ICD-10-CM | POA: Diagnosis not present

## 2021-12-18 DIAGNOSIS — N289 Disorder of kidney and ureter, unspecified: Secondary | ICD-10-CM

## 2021-12-18 DIAGNOSIS — R627 Adult failure to thrive: Secondary | ICD-10-CM | POA: Diagnosis not present

## 2021-12-18 DIAGNOSIS — I129 Hypertensive chronic kidney disease with stage 1 through stage 4 chronic kidney disease, or unspecified chronic kidney disease: Secondary | ICD-10-CM | POA: Diagnosis not present

## 2021-12-18 LAB — BASIC METABOLIC PANEL
Anion gap: 9 (ref 5–15)
BUN: 58 mg/dL — ABNORMAL HIGH (ref 8–23)
CO2: 18 mmol/L — ABNORMAL LOW (ref 22–32)
Calcium: 10 mg/dL (ref 8.9–10.3)
Chloride: 113 mmol/L — ABNORMAL HIGH (ref 98–111)
Creatinine, Ser: 5.18 mg/dL — ABNORMAL HIGH (ref 0.61–1.24)
GFR, Estimated: 10 mL/min — ABNORMAL LOW (ref >60)
Glucose, Bld: 88 mg/dL (ref 70–99)
Potassium: 4.6 mmol/L (ref 3.5–5.1)
Sodium: 140 mmol/L (ref 135–145)

## 2021-12-18 LAB — CBC
HCT: 26.6 % — ABNORMAL LOW (ref 39.0–52.0)
Hemoglobin: 8.9 g/dL — ABNORMAL LOW (ref 13.0–17.0)
MCH: 31.8 pg (ref 26.0–34.0)
MCHC: 33.5 g/dL (ref 30.0–36.0)
MCV: 95 fL (ref 80.0–100.0)
Platelets: 102 10*3/uL — ABNORMAL LOW (ref 150–400)
RBC: 2.8 MIL/uL — ABNORMAL LOW (ref 4.22–5.81)
RDW: 12.9 % (ref 11.5–15.5)
WBC: 3.3 10*3/uL — ABNORMAL LOW (ref 4.0–10.5)
nRBC: 0 % (ref 0.0–0.2)

## 2021-12-18 LAB — PATHOLOGIST SMEAR REVIEW

## 2021-12-18 LAB — FOLATE: Folate: 8.3 ng/mL (ref >5.9)

## 2021-12-18 LAB — VITAMIN B12: Vitamin B-12: 529 pg/mL (ref 180–914)

## 2021-12-18 MED ORDER — CARVEDILOL 3.125 MG PO TABS
3.1250 mg | ORAL_TABLET | Freq: Two times a day (BID) | ORAL | Status: DC
  Administered 2021-12-18 (×2): 3.125 mg via ORAL
  Filled 2021-12-18 (×2): qty 1

## 2021-12-18 MED ORDER — ENSURE ENLIVE PO LIQD
237.0000 mL | Freq: Three times a day (TID) | ORAL | Status: DC
  Administered 2021-12-18 – 2021-12-24 (×17): 237 mL via ORAL

## 2021-12-18 NOTE — Progress Notes (Signed)
Physical Therapy Treatment ?Patient Details ?Name: Patrick [name]. ?MRN: [id number] ?DOB: [birth year] ?Today's Date: 12/18/2021 ? ? ?History of Present Illness 88 y.o. male presenting with uncontrolled HTN, previous fall, unstable ambulation. PMHx is significant for HTN, CKD, dementia, glaucoma, dyslipidemia, h/o prostate cancer, osteoporosis, protein calorie malnutrition, ACD. ? ?  ?PT Comments  ? ? Pt not as steady today and requiring a little more assist with ambulation and transfers. Since pt is requiring hands on assist for mobility and wife isn't abel to provide that currently updated recommendation to SNF with hope that with some further therapy, pt's mobility and balance will improve to the point that he can eventually return home with wife.   ?Recommendations for follow up therapy are one component of a multi-disciplinary discharge planning process, led by the attending physician.  Recommendations may be updated based on patient status, additional functional criteria and insurance authorization. ? ?Follow Up Recommendations ? Skilled nursing-short term rehab (<3 hours/day) ?  ?  ?Assistance Recommended at Discharge Frequent or constant Supervision/Assistance  ?Patient can return home with the following A little help with walking and/or transfers;Help with stairs or ramp for entrance;Assist for transportation ?  ?Equipment Recommendations ? Rollator (4 wheels)  ?  ?Recommendations for Other Services   ? ? ?  ?Precautions / Restrictions Precautions ?Precautions: Fall  ?  ? ?Mobility ? Bed Mobility ?Overal bed mobility: Needs Assistance ?Bed Mobility: Supine to Sit ?  ?  ?Supine to sit: Min guard ?  ?  ?General bed mobility comments: Verbal cues to stay on task and assist for safety ?  ? ?Transfers ?Overall transfer level: Needs assistance ?Equipment used: Rollator (4 wheels) ?Transfers: Sit to/from Stand ?Sit to Stand: Min assist ?  ?  ?  ?  ?  ?General transfer comment: Assist for balance and incr time to  rise ?  ? ?Ambulation/Gait ?Ambulation/Gait assistance: Min assist ?Gait Distance (Feet): 200 Feet ?Assistive device: None, Rollator (4 wheels) ?Gait Pattern/deviations: Step-through pattern, Decreased step length - right, Decreased step length - left, Shuffle, Trunk flexed, Knee flexed in stance - right, Knee flexed in stance - left ?Gait velocity: decr ?Gait velocity interpretation: <1.8 ft/sec, indicate of risk for recurrent falls ?  ?General Gait Details: Assist for balance and safety. Verbal cues for use of rollator ? ? ?Stairs ?  ?  ?  ?  ?  ? ? ?Wheelchair Mobility ?  ? ?Modified Rankin (Stroke Patients Only) ?  ? ? ?  ?Balance Overall balance assessment: Needs assistance, History of Falls ?Sitting-balance support: No upper extremity supported ?Sitting balance-Leahy Scale: Good ?  ?  ?Standing balance support: No upper extremity supported, During functional activity ?Standing balance-Leahy Scale: Poor ?Standing balance comment: static standing with mn guard assist ?  ?  ?  ?  ?  ?  ?  ?  ?  ?  ?  ?  ? ?  ?Cognition Arousal/Alertness: Awake/alert ?Behavior During Therapy: Flat affect ?Overall Cognitive Status: No family/caregiver present to determine baseline cognitive functioning ?  ?  ?  ?  ?  ?  ?  ?  ?  ?  ?  ?  ?  ?  ?  ?  ?General Comments: Hx of dementia, Tangential and perseverating on song lyrics. Follows 1 step commands. Decr awareness of safety ?  ?  ? ?  ?Exercises   ? ?  ?General Comments   ?  ?  ? ?Pertinent Vitals/Pain Pain Assessment ?Pain Assessment: No/denies pain  ? ? ?  Home Living   ?  ?  ?  ?  ?  ?  ?  ?  ?  ?   ?  ?Prior Function    ?  ?  ?   ? ?PT Goals (current goals can now be found in the care plan section) Acute Rehab PT Goals ?Patient Stated Goal: Pt didn't state ?Progress towards PT goals: Progressing toward goals ? ?  ?Frequency ? ? ? Min 3X/week ? ? ? ?  ?PT Plan Discharge plan needs to be updated  ? ? ?Co-evaluation   ?  ?  ?  ?  ? ?  ?AM-PAC PT "6 Clicks" Mobility   ?Outcome  Measure ? Help needed turning from your back to your side while in a flat bed without using bedrails?: None ?Help needed moving from lying on your back to sitting on the side of a flat bed without using bedrails?: A Little ?Help needed moving to and from a bed to a chair (including a wheelchair)?: A Little ?Help needed standing up from a chair using your arms (e.g., wheelchair or bedside chair)?: A Little ?Help needed to walk in hospital room?: A Little ?Help needed climbing 3-5 steps with a railing? : A Little ?6 Click Score: 19 ? ?  ?End of Session Equipment Utilized During Treatment: Gait belt ?Activity Tolerance: Patient tolerated treatment well ?Patient left: with call bell/phone within reach;in chair;with chair alarm set (waist alarm belt on in chair) ?Nurse Communication: Mobility status ?PT Visit Diagnosis: Other abnormalities of gait and mobility (R26.89);Muscle weakness (generalized) (M62.81);History of falling (Z91.81) ?  ? ? ?Time: 3500-9381 ?PT Time Calculation (min) (ACUTE ONLY): 25 min ? ?Charges:  $Gait Training: 23-37 mins          ?          ? ?[facility] PT ?Acute Rehabilitation Services ?Office [phone] ? ? ? ?Shary Decamp [facility] ?12/18/2021, 11:22 AM ? ?

## 2021-12-18 NOTE — Progress Notes (Signed)
Family Medicine Teaching Service ?Daily Progress Note ?Intern Pager: [phone] ? ?Patient name: Patrick [name]. Medical record number: [id number] ?Date of birth: April 11, 1932 Age: 88 y.o. Gender: male ? ?Primary Care Provider: Steele Sizer, MD ?Consultants: Nephrology, palliative ?Code Status: DNR  ? ?Pt Overview and Major Events to Date:  ?12/16/21: Admitted to [facility]  ?  ? ?Assessment and Plan: ?Mychael P Alexandru Moorer. is a 88 y.o. male presenting with uncontrolled HTN, previous fall, unstable ambulation, new AKI. PMHx is significant for HTN, CKD, dementia, glaucoma, dyslipidemia, h/o prostate cancer, osteoporosis, protein calorie malnutrition, ACD, pulmonary HTN.  ? ?AKI on CKD stage IV, likely prerenal  ?Nephrology saw, recommended UA that was fairly unremarkable (cloudy appearance with proteinuria). Last bladder scan volume 71m, UOP documented as 4533mwith 3 undocumented voids. No dialysis needed currently, continued improvement with Cr 5.82>5.18 with GFR 10. Continue with encouraging fluid intake.  ?- Nephrology saw, appreciate recommendations  ?- AM BMP ?- Encourage fluid intake  ?- Bladder scans q8 ?- Strict I/Os  ?- Avoid nephrotoxic agents  ? ?Uncontrolled HTN  ?BP this AM 192/85, has not received morning medications as of yet. Continue monitoring VS and continue Hydral 50 TID, coreg 3.125 BID, and Amlodipine 10 mg.  ?- Continue antihypertensives as above ?- Consider increasing hydralazine as needed  ?- Monitor VS  ? ?Severe Pulmonary HTN  Pericardial Effusion  ?Updated echo from 4/10 shows: LVEF 55-60% with mild concentric LVH and G1DD, moderate pericardial effusion, trivial MV regurg, mild TV regurg, Aortic valve trivial regurg, PASP of 52. Given preserved EF, we continued with timed fluid resuscitation.  Consider cardiology consult given pulmonary HTN and pericardial effusion for assistance in treatment, although we must assess risk versus benefit of treatments with multiple comorbidities.  ?- Continue  Coreg, hydral, and Amlodipine for BP control ?- Consider cards consult  ?- Monitor for volume overload  ? ?Alzheimer's Disease  Falls at home  Difficulty with ambulation  Protein Calorie Malnutrition  ? Vitamin B12 and folate wnl. However, patient still likely has nutritional deficiency to some extent with his progression of Alzheimer's. This difficulty with ADLs is unlikely to improve without known reversible cause. PT recommended HHPT and OT recommended SNF, will discuss with wife. Low stroke concern with neurological exam at baseline and unchanged.  ?- PT/OT eval and treat  ?- Water offered q4 hours  ?- renal diet with 2L fluid restriction  ?- assistance with meals  ?- fluid resuscitation as needed  ?- Ensure added with meals  ?- MiraLAX added to assist with stools  ? ?[place]?Patient appears to be deteriorating with his dementia and has been seeing [facility] outpatient. Wife reported to palliative that her goal was to have patient return home, would like to continue all current interventions. We can optimize patient's HTN and CKD. Maintaining nutrition and hydration at home appears that it would be difficult as wife is still dealing with health issues. Here, we are assisting with meals and offering water regularly. Appreciate PMT assistance, they will continue to follow inpatient, and hopeful patient will follow up outpatient for continued conversations. Patient will have poor prognosis if he cannot appropriately hydrate without assistance with fluids, has relatively poor prognosis with Alzheimer's progression.  ?- Palliative following, appreciate recommendations  ?- Continue conversations with wife  ?- Provide home assistance with wife if able  ?- Currently prefer HHPT services to SNF   ? ? ? ?FEN/GI: Renal Diet with 2L restriction ?PPx: Hep subq ?Dispo: Home pending  optimization of HTN and CKD. Barriers include further clinical improvement and discussions with wife.  ? ?Subjective:  ?Patient has no  complaints this AM, no questions.  ? ?Objective: ?Temp:  [97.7 ?F (36.5 ?C)-98.1 ?F (36.7 ?C)] 98.1 ?F (36.7 ?C) (04/11 3734) ?Pulse Rate:  [72-78] 78 (04/11 0433) ?Resp:  [16-17] 16 (04/11 0433) ?BP: (164-192)/(75-85) 192/85 (04/11 0433) ?SpO2:  [97 %-99 %] 99 % (04/11 0433) ?Physical Exam: ?General: NAD, nontoxic, resting in bed, groggy   ?Cardiovascular: RRR without murmur/gallop/rub ?Respiratory: CTAB  ?Extremities: Able to move extremities  ?Neuro: Difficult to assess given lack of participation this AM, no facial drooping, forming words appropriately  ? ?Laboratory: ?Recent Labs  ?Lab 12/16/21 ?1433 12/17/21 ?0451 12/18/21 ?0243  ?WBC 2.9* 3.3* 3.3*  ?HGB 10.1* 8.5* 8.9*  ?HCT 30.3* 25.9* 26.6*  ?PLT 128* 108* 102*  ? ?Recent Labs  ?Lab 12/16/21 ?1433 12/17/21 ?0451 12/18/21 ?0243  ?NA 139 141 140  ?K 5.0 4.5 4.6  ?CL 109 113* 113*  ?CO2 21* 21* 18*  ?BUN 68* 61* 58*  ?CREATININE 6.45* 5.82* 5.18*  ?CALCIUM 10.3 9.9 10.0  ?PROT 7.7  --   --   ?BILITOT 0.7  --   --   ?ALKPHOS 54  --   --   ?ALT 14  --   --   ?AST 11*  --   --   ?GLUCOSE 104* 109* 88  ? ? ? ?Erskine Emery, MD ?12/18/2021, 8:16 AM ?PGY-1, [facility] ?[place] Intern pager: [phone], text pages welcome ? ?

## 2021-12-18 NOTE — Progress Notes (Signed)
Initial Nutrition Assessment ? ?DOCUMENTATION CODES:  ?Severe malnutrition in context of chronic illness ? ?INTERVENTION:  ?-Ensure Enlive po TID, each supplement provides 350 kcal and 20 grams of protein. ?-MVI with minerals daily ? ?NUTRITION DIAGNOSIS:  ?Severe Malnutrition related to chronic illness (dementia) as evidenced by severe fat depletion, severe muscle depletion, percent weight loss. ? ?GOAL:  ?Patient will meet greater than or equal to 90% of their needs ? ?MONITOR:  ?PO intake, Supplement acceptance, Labs, Weight trends ? ?REASON FOR ASSESSMENT:  ?Malnutrition Screening Tool ?  ? ?ASSESSMENT:  ?Pt with PMH significant for HTN, CKD IV, dementia, and malnutrition admitted with FTT and AKI. ? ?Nephrology following and noted pt appears to be declining with candidacy for dialysis deteriorating. PMT involved for GOC.  ? ?Pt sleeping and did not wake to RD voice/touch. Will attempt to obtain diet/weight history at follow-up. Per RN, pt doing well with ONS. Recommend continue current nutrition plan of care and monitor for ongoing [place] conversations.  ? ?Weight history reviewed. Pt with clinically significant 11.9% weight loss x6 months (documented to weigh 76.7 kg on 06/12/21) ? ?PO Intake: 0-50%  ? ?UOP: 473m x24 hours ?I/O: +18622msince admit ? ?Medications: ? feeding supplement  237 mL Oral BID BM  ? polyethylene glycol  17 g Oral Daily  ? ?Labs: ?Recent Labs  ?Lab 12/16/21 ?1433 12/17/21 ?0451 12/18/21 ?0243  ?NA 139 141 140  ?K 5.0 4.5 4.6  ?CL 109 113* 113*  ?CO2 21* 21* 18*  ?BUN 68* 61* 58*  ?CREATININE 6.45* 5.82* 5.18*  ?CALCIUM 10.3 9.9 10.0  ?MG  --  2.0  --   ?PHOS  --  4.1  --   ?GLUCOSE 104* 109* 88  ?Hgb 8.9 ? ?NUTRITION - FOCUSED PHYSICAL EXAM: ?Flowsheet Row Most Recent Value  ?Orbital Region Severe depletion  ?Upper Arm Region Severe depletion  ?Thoracic and Lumbar Region Severe depletion  ?Buccal Region Moderate depletion  ?Temple Region Severe depletion  ?Clavicle Bone Region Severe  depletion  ?Clavicle and Acromion Bone Region Severe depletion  ?Scapular Bone Region Severe depletion  ?Dorsal Hand Severe depletion  ?Patellar Region Severe depletion  ?Anterior Thigh Region Severe depletion  ?Posterior Calf Region Severe depletion  ?Edema (RD Assessment) None  ?Hair Reviewed  ?Eyes Unable to assess  ?Mouth Unable to assess  ?Skin Reviewed  ?Nails Reviewed  ? ?Diet Order:   ?Diet Order   ? ?       ?  Diet regular Room service appropriate? Yes; Fluid consistency: Thin  Diet effective now       ?  ? ?  ?  ? ?  ? ?EDUCATION NEEDS:  ?Not appropriate for education at this time ? ?Skin:  Skin Assessment: Reviewed RN Assessment ? ?Last BM:  PTA ? ?Height:  ?Ht Readings from Last 1 Encounters:  ?12/16/21 '6\' 2"'$  (1.88 m)  ? ?Weight:  ?Wt Readings from Last 5 Encounters:  ?12/16/21 67.6 kg  ?09/24/21 73.9 kg  ?08/24/21 75.9 kg  ?06/12/21 76.7 kg  ?06/08/21 75.7 kg  ? ?BMI:  Body mass index is 19.13 kg/m?. ? ?Estimated Nutritional Needs:  ?Kcal:  2000-2200 ?Protein:  100-110 grams ?Fluid:  2L ? ? ?AmTheone Stanley MS, RD, LDN (she/her/hers) ?RD pager number and weekend/on-call pager number located in [place]?

## 2021-12-18 NOTE — Progress Notes (Signed)
FPTS Interim Progress Note ? ?Spoke to wife, Altha Harm, about preferences of discharge for [name] after medical optimization. She expresses that she does not have help at home and has difficulty getting around the house herself as she is suffering from a back injury that will require surgery in the near future. After discussion, we decided that SNF, if appropriate, would be the best decision for the patient's well being. If SNF is needed, wife would prefer a surrounding nursing facility in the area. We discussed that his prognosis is still poor with his dementia and comorbidities. Will continue having conversations with wife surrounding [place].   ? ?Erskine Emery, MD ?12/18/2021, 9:08 AM ?PGY-1, [facility] ?Service pager [phone] ? ?

## 2021-12-18 NOTE — Progress Notes (Signed)
Nephrology Follow-Up Consult note ? ? ?Assessment/Recommendations: Patrick [name]. is a/an 88 y.o. male with a past medical history significant for HTN, CKD IV, dementia, malnutrition who present w/ fall and FTT c/b AKI  ?  ?AKI on CKD IV: CKD multifactorial but notably large polycystic kidneys on RUS. AKI likely non-oliguric. Maybe some dehydration and improving with supportive care alone. No obvious uremic signs and more c/w baseline dementia ?-Continue to monitor daily Cr, Dose meds for GFR ?-Monitor Daily I/Os, Daily weight  ?-Maintain MAP>65 for optimal renal perfusion.  ?-Avoid nephrotoxic medications including NSAIDs ?-Use synthetic opioids (Fentanyl/Dilaudid) if needed ?-Currently no indication for HD given no obvious uremia and Crt improving ?-The patient has FTT and I think his candidacy for dialysis is deteriorating. Pal care involved ?-If crt improves tomorrow we may sign off and plan to follow up closely outpatient. Will need ongoing [place] conversations about HD candidacy given his overall decline ?  ?Fall/FTT: Significant weight loss in the outpatient setting.  Patient appears to be declining. Pal care involved.  Management per primary team ?  ?Hypertension: Worsened in the setting of noncompliance.  Starting coreg 3.'125mg'$  BID today ?  ?Pancytopenia: Anemia likely multifactorial but unclear cause of leukopenia and thrombocytopenia.  Management per primary.  With his weight loss I think underlying malignancy would be a concern ? ?Moderate pericardial effusion: noted on echo. Mgmt per primary team and cardiology if needed. Hesitant to increase hydral at this time as it can rarely cause pericardial effusion via SLE like syndrome ? ?Dispo: snf ? ? ?Recommendations conveyed to primary service.  ? ? ?Reesa Chew ?[facility] ?12/18/2021 ?11:52 AM ? ?___________________________________________________________ ? ?CC: aki, ftt, fall ? ?Interval History/Subjective: Patient states he feels  fine today.  Specifically denies fevers, chills, chest pain, nausea, vomiting, poor appetite. ? ? ?Medications:  ?Current Facility-Administered Medications  ?Medication Dose Route Frequency Provider Last Rate Last Admin  ? amLODipine (NORVASC) tablet 10 mg  10 mg Oral Daily Espinoza, Alejandra, DO   10 mg at 12/18/21 0956  ? atorvastatin (LIPITOR) tablet 40 mg  40 mg Oral QPM Maxwell, Allee, MD   40 mg at 12/17/21 1736  ? carvedilol (COREG) tablet 3.125 mg  3.125 mg Oral BID WC Reesa Chew, MD   3.125 mg at 12/18/21 0957  ? feeding supplement (ENSURE ENLIVE / ENSURE PLUS) liquid 237 mL  237 mL Oral BID BM Zigmund Daniel, Allee, MD   237 mL at 12/18/21 0957  ? heparin injection 5,000 Units  5,000 Units Subcutaneous Q8H Erskine Emery, MD   5,000 Units at 12/18/21 5361  ? hydrALAZINE (APRESOLINE) tablet 50 mg  50 mg Oral TID Holley Bouche, MD   50 mg at 12/18/21 0957  ? polyethylene glycol (MIRALAX / GLYCOLAX) packet 17 g  17 g Oral Daily Ganta, Anupa, DO   17 g at 12/18/21 0956  ?  ? ? ?Review of Systems: ?10 systems reviewed and negative except per interval history/subjective ? ?Physical Exam: ?Vitals:  ? 12/18/21 0433 12/18/21 0905  ?BP: (!) 192/85 (!) 180/90  ?Pulse: 78 70  ?Resp: 16 15  ?Temp: 98.1 ?F (36.7 ?C) (!) 97.4 ?F (36.3 ?C)  ?SpO2: 99% 100%  ? ?Total I/O ?In: 357 [P.O.:357] ?Out: -  ? ?Intake/Output Summary (Last 24 hours) at 12/18/2021 1152 ?Last data filed at 12/18/2021 1002 ?Gross per 24 hour  ?Intake 1422.84 ml  ?Output 450 ml  ?Net 972.84 ml  ? ?Constitutional: Chronically ill-appearing, no distress ?ENMT: ears  and nose without scars or lesions, MMM ?CV: normal rate, no edema ?Respiratory: Bilateral chest rise normal work of breathing ?Gastrointestinal: soft, non-tender, no palpable masses or hernias ?Skin: no visible lesions or rashes ?Psych: alert, appropriate mood and affect ? ? ?Test Results ?I personally reviewed new and old clinical labs and radiology tests ?Lab Results  ?Component Value Date   ? NA 140 12/18/2021  ? K 4.6 12/18/2021  ? CL 113 (H) 12/18/2021  ? CO2 18 (L) 12/18/2021  ? BUN 58 (H) 12/18/2021  ? CREATININE 5.18 (H) 12/18/2021  ? CALCIUM 10.0 12/18/2021  ? ALBUMIN 3.2 (L) 12/17/2021  ? PHOS 4.1 12/17/2021  ? ? ?CBC ?Recent Labs  ?Lab 12/16/21 ?1433 12/17/21 ?0451 12/18/21 ?0243  ?WBC 2.9* 3.3* 3.3*  ?NEUTROABS 1.9 2.2  --   ?HGB 10.1* 8.5* 8.9*  ?HCT 30.3* 25.9* 26.6*  ?MCV 95.6 94.9 95.0  ?PLT 128* 108* 102*  ? ? ? ? ? ?

## 2021-12-18 NOTE — Progress Notes (Signed)
FPTS Night Rounding Progress Note ? ?S:Went to check on patient, he was sleeping so I did not wake him.  ? ?O: ?BP (!) 172/85 (BP Location: Right Arm)   Pulse 72   Temp 98.1 ?F (36.7 ?C) (Oral)   Resp 17   Ht '6\' 2"'$  (1.88 m)   Wt 67.6 kg   SpO2 97%   BMI 19.13 kg/m?   ?General: Patient sleeping comfortably, in no acute distress. ?Resp: normal work of breathing  ? ?A/P: ?Vitals and orders reviewed. Monitoring bladder scans every 8 hours, most recently measured 214 and 90 mL. Will continue to monitor BP, consider administration of additional antihypertensive if BP systolic >309M consistently. Plan per day team, no changes. ? Donney Dice, DO ?12/18/2021, 12:47 AM ?PGY-2, [place] ?Service pager [phone]  ?

## 2021-12-18 NOTE — Progress Notes (Signed)
?                                                                                                                                                     ?                                                   ?Palliative Medicine Progress Note  ? ?Patient Name: Patrick [name].       Date: 12/18/2021 ?DOB: 11/18/1931  Age: 88 y.o. MRN#: [id number] ?Attending Physician: Martyn Malay, MD ?Primary Care Physician: Steele Sizer, MD ?Admit Date: 12/16/2021 ? ? ?HPI/Patient Profile: ?88 y.o. male  with past medical history of dementia, CKD stage IV, protein calorie malnutrition, history of prostate cancer, osteoporosis, hypertension, and hyperlipidemia who presented to the emergency department on 12/16/2021 with fall at home and unstable ambulation.  Found to have creatinine of 6.45.  Blood pressure in the ED 180-210/77-100.  He was admitted to [facility] with acute on chronic kidney failure and uncontrolled hypertension. ? ?Subjective: ?Chart reviewed. Note that creatinine has decreased to 5.18 today.  ? ?Assessed patient at bedside.  He is out of bed to the recliner. Oriented to self with no acute complaints.  Note that lunch tray remains in front of patient with what appears to be a few bites eaten. ? ?I spoke with his wife Patrick [name] by phone she confirms that plan is for rehab after discharge.  She verbalizes understanding that his overall prognosis remains poor.  We discussed that rehab is short-term (usually 3 weeks) with the goal of improving functional status. Discussed that this is often difficult for patients with advanced dementia.  Discussed that if patient does not improve in rehab he will likely need long-term care.  Patrick [name] feels that it is unlikely he will improve enough to return home.  Christine verbalizes concerns about ability to pay for long-term care and plans to start the application process for Medicaid in the near future. ? ?I discussed with Patrick [name] my recommendation to consider the addition of hospice  support at facility for [facility] if/when he requires long-term placement. I explained that hospice would be extra care for patient, communication with family, and assistance with symptom management and care when he/she further declines.  Christine verbalizes understanding and agrees that hospice would likely be beneficial in the future. ? ? ?Objective: ? ?Physical Exam ?Constitutional:   ?   General: He is not in acute distress. ?   Appearance: He is ill-appearing.  ?Pulmonary:  ?   Effort: Pulmonary effort is normal.  ?Neurological:  ?   Mental Status: He is alert.  ?   Motor: Weakness present.  ?  Comments: Oriented to self   ?Psychiatric:     ?   Cognition and Memory: Cognition is impaired. Memory is impaired.  ?         ? ?Vital Signs: BP (!) 151/74   Pulse 64   Temp (!) 97.4 ?F (36.3 ?C) (Oral)   Resp 16   Ht '6\' 2"'$  (1.88 m)   Wt 67.6 kg   SpO2 100%   BMI 19.13 kg/m?  ?SpO2: SpO2: 100 % ?O2 Device: O2 Device: Room Air ?O2 Flow Rate:   ? ? ?LBM: Last BM Date :  (Pt unable to recall) ? ?   ?Palliative Assessment/Data: PPS 40% ? ? ? ? ?Palliative Medicine Assessment & Plan  ? ?Assessment: ?Principal Problem: ?  HTN (hypertension) ?  ? ?Recommendations/Plan: ?DNR/DNI as previously documented ?Continue all current interventions with watchful waiting ?Wife is hopeful for improvement but also understands that overall prognosis is poor ?Plan is for SNF/rehab; patient may ultimately need long-term placement ?PMT will continue to follow ?Continue outpatient palliative discharge ? ? ?Prognosis: ? Unable to determine, less than 6 months would not be surprising ? ?Discharge Planning: ?[place] for rehab with Palliative care service follow-up ? ? ? ?Thank you for allowing the Palliative Medicine Team to assist in the care of this patient. ? ? ?MDM - high ? ? ?Lavena Bullion, NP ? ? ?Please contact Palliative Medicine Team phone at [phone] for questions and concerns.  ?For individual providers,  please see AMION. ? ? ? ? ? ?

## 2021-12-19 DIAGNOSIS — E43 Unspecified severe protein-calorie malnutrition: Secondary | ICD-10-CM | POA: Diagnosis not present

## 2021-12-19 DIAGNOSIS — N185 Chronic kidney disease, stage 5: Secondary | ICD-10-CM | POA: Diagnosis not present

## 2021-12-19 DIAGNOSIS — R627 Adult failure to thrive: Secondary | ICD-10-CM | POA: Diagnosis not present

## 2021-12-19 DIAGNOSIS — I129 Hypertensive chronic kidney disease with stage 1 through stage 4 chronic kidney disease, or unspecified chronic kidney disease: Secondary | ICD-10-CM | POA: Diagnosis not present

## 2021-12-19 DIAGNOSIS — I151 Hypertension secondary to other renal disorders: Secondary | ICD-10-CM | POA: Diagnosis not present

## 2021-12-19 DIAGNOSIS — I10 Essential (primary) hypertension: Secondary | ICD-10-CM

## 2021-12-19 DIAGNOSIS — N2889 Other specified disorders of kidney and ureter: Secondary | ICD-10-CM | POA: Diagnosis not present

## 2021-12-19 DIAGNOSIS — N179 Acute kidney failure, unspecified: Secondary | ICD-10-CM | POA: Diagnosis not present

## 2021-12-19 DIAGNOSIS — D61818 Other pancytopenia: Secondary | ICD-10-CM | POA: Diagnosis not present

## 2021-12-19 DIAGNOSIS — W19XXXA Unspecified fall, initial encounter: Secondary | ICD-10-CM | POA: Diagnosis not present

## 2021-12-19 DIAGNOSIS — N184 Chronic kidney disease, stage 4 (severe): Secondary | ICD-10-CM | POA: Diagnosis not present

## 2021-12-19 DIAGNOSIS — F0390 Unspecified dementia without behavioral disturbance: Secondary | ICD-10-CM | POA: Diagnosis not present

## 2021-12-19 LAB — BASIC METABOLIC PANEL
Anion gap: 7 (ref 5–15)
BUN: 64 mg/dL — ABNORMAL HIGH (ref 8–23)
CO2: 22 mmol/L (ref 22–32)
Calcium: 10 mg/dL (ref 8.9–10.3)
Chloride: 111 mmol/L (ref 98–111)
Creatinine, Ser: 5.4 mg/dL — ABNORMAL HIGH (ref 0.61–1.24)
GFR, Estimated: 10 mL/min — ABNORMAL LOW (ref >60)
Glucose, Bld: 105 mg/dL — ABNORMAL HIGH (ref 70–99)
Potassium: 5.1 mmol/L (ref 3.5–5.1)
Sodium: 140 mmol/L (ref 135–145)

## 2021-12-19 LAB — CBC
HCT: 26.5 % — ABNORMAL LOW (ref 39.0–52.0)
Hemoglobin: 8.9 g/dL — ABNORMAL LOW (ref 13.0–17.0)
MCH: 31.2 pg (ref 26.0–34.0)
MCHC: 33.6 g/dL (ref 30.0–36.0)
MCV: 93 fL (ref 80.0–100.0)
Platelets: 109 10*3/uL — ABNORMAL LOW (ref 150–400)
RBC: 2.85 MIL/uL — ABNORMAL LOW (ref 4.22–5.81)
RDW: 12.9 % (ref 11.5–15.5)
WBC: 4 10*3/uL (ref 4.0–10.5)
nRBC: 0 % (ref 0.0–0.2)

## 2021-12-19 MED ORDER — CARVEDILOL 6.25 MG PO TABS
6.2500 mg | ORAL_TABLET | Freq: Two times a day (BID) | ORAL | Status: DC
  Administered 2021-12-19 – 2021-12-20 (×4): 6.25 mg via ORAL
  Filled 2021-12-19 (×4): qty 1

## 2021-12-19 NOTE — Progress Notes (Signed)
Nephrology Follow-Up Consult note ? ? ?Assessment/Recommendations: Patrick P Darty Jr. is a/an 88 y.o. male with a past medical history significant for HTN, CKD IV, dementia, malnutrition who present w/ fall and FTT c/b AKI  ?  ?AKI on CKD IV: CKD multifactorial but notably large polycystic kidneys on RUS. AKI likely non-oliguric. Maybe some dehydration that improved with supportive care. No obvious uremic signs and more c/w baseline dementia ?-Creatinine seems to have plateaued around 5.4.  There could be some ongoing dehydration component likely related to poor p.o. intake for the patient.  I do not think further IV fluids are necessary at this time and would encourage p.o. intake as this will be his main source of hydration after he leaves the hospital ?-His creatinine may not get much better but could slowly downtrend with time if the patient maintains his hydration.  I do not think he has to stay in the hospital for his kidney function alone ?-Appreciate the help from palliative care.  Given his failure to thrive I do not think he is a good long-term dialysis candidate.  I agree with possibly enrolling help from hospice to help with the patient's symptoms. ?-Continue to monitor daily Cr, Dose meds for GFR ?-Monitor Daily I/Os, Daily weight  ?-Maintain MAP>65 for optimal renal perfusion.  ?-Avoid nephrotoxic medications including NSAIDs ?-Use synthetic opioids (Fentanyl/Dilaudid) if needed ?  ?Fall/FTT: Significant weight loss in the outpatient setting.  Patient appears to be declining. Pal care involved and appreciate help ?  ?Hypertension: Worsened in the setting of noncompliance.  Can increase carvedilol today ?  ?Pancytopenia: Anemia likely multifactorial but unclear cause of leukopenia and thrombocytopenia.  Management per primary.  With his weight loss I think underlying malignancy would be a concern ? ?Moderate pericardial effusion: noted on echo. Mgmt per primary team and cardiology if needed. Hesitant  to increase hydral at this time as it can rarely cause pericardial effusion via SLE like syndrome ? ?Dispo: snf.  The patient is stable for discharge from nephrology standpoint. ? ? ?Recommendations conveyed to primary service.  ? ? ? J  ?[facility] ?12/19/2021 ?10:13 AM ? ?___________________________________________________________ ? ?CC: aki, ftt, fall ? ?Interval History/Subjective: Patient tired today but when I wake him up he denies any complaints.  Creatinine slightly higher today at 5.4. ? ?Patient met with palliative care yesterday who also discussed care with his wife.  Given his overall demise his family understands that he is unlikely to have significant improvement with time.  May need to enroll help from hospice in the future. ? ? ?Medications:  ?Current Facility-Administered Medications  ?Medication Dose Route Frequency Provider Last Rate Last Admin  ? amLODipine (NORVASC) tablet 10 mg  10 mg Oral Daily Espinoza, Alejandra, DO   10 mg at 12/19/21 0934  ? atorvastatin (LIPITOR) tablet 40 mg  40 mg Oral QPM Maxwell, Allee, MD   40 mg at 12/18/21 1705  ? carvedilol (COREG) tablet 6.25 mg  6.25 mg Oral BID WC ,  J, MD   6.25 mg at 12/19/21 0934  ? feeding supplement (ENSURE ENLIVE / ENSURE PLUS) liquid 237 mL  237 mL Oral TID BM Brown, Carina M, MD   237 mL at 12/19/21 0934  ? heparin injection 5,000 Units  5,000 Units Subcutaneous Q8H Maxwell, Allee, MD   5,000 Units at 12/19/21 0540  ? hydrALAZINE (APRESOLINE) tablet 50 mg  50 mg Oral TID Sowell, Brandon, MD   50 mg at 12/19/21 0934  ? polyethylene   glycol (MIRALAX / GLYCOLAX) packet 17 g  17 g Oral Daily Ganta, Anupa, DO   17 g at 12/19/21 0934  ?  ? ? ?Review of Systems: ?10 systems reviewed and negative except per interval history/subjective ? ?Physical Exam: ?Vitals:  ? 12/19/21 0449 12/19/21 0901  ?BP: (!) 165/78 (!) 171/85  ?Pulse: 61 61  ?Resp: 18 15  ?Temp: (!) 97.5 ?F (36.4 ?C) 98.1 ?F (36.7 ?C)  ?SpO2:  100% 98%  ? ?No intake/output data recorded. ? ?Intake/Output Summary (Last 24 hours) at 12/19/2021 1013 ?Last data filed at 12/19/2021 0600 ?Gross per 24 hour  ?Intake 697 ml  ?Output 700 ml  ?Net -3 ml  ? ?Constitutional: Chronically ill-appearing, no distress ?ENMT: ears and nose without scars or lesions, MMM ?CV: normal rate, trace edema in ankle ?Respiratory: Bilateral chest rise normal work of breathing ?Gastrointestinal: soft, non-tender, no palpable masses or hernias ?Skin: no visible lesions or rashes ?Psych: alert, appropriate mood and affect ? ? ?Test Results ?I personally reviewed new and old clinical labs and radiology tests ?Lab Results  ?Component Value Date  ? NA 140 12/19/2021  ? K 5.1 12/19/2021  ? CL 111 12/19/2021  ? CO2 22 12/19/2021  ? BUN 64 (H) 12/19/2021  ? CREATININE 5.40 (H) 12/19/2021  ? CALCIUM 10.0 12/19/2021  ? ALBUMIN 3.2 (L) 12/17/2021  ? PHOS 4.1 12/17/2021  ? ? ?CBC ?Recent Labs  ?Lab 12/16/21 ?1433 12/17/21 ?0451 12/18/21 ?0243 12/19/21 ?0308  ?WBC 2.9* 3.3* 3.3* 4.0  ?NEUTROABS 1.9 2.2  --   --   ?HGB 10.1* 8.5* 8.9* 8.9*  ?HCT 30.3* 25.9* 26.6* 26.5*  ?MCV 95.6 94.9 95.0 93.0  ?PLT 128* 108* 102* 109*  ? ? ? ? ? ?

## 2021-12-19 NOTE — Progress Notes (Signed)
FPTS Brief Note ?Reviewed patient's vitals, recent notes.  ?Vitals:  ? 12/19/21 1709 12/19/21 2106  ?BP: (!) 164/71 (!) 158/68  ?Pulse: 78 (!) 55  ?Resp: 16 15  ?Temp: 97.6 ?F (36.4 ?C) 98.2 ?F (36.8 ?C)  ?SpO2: 100%   ? ?At this time, no change in plan from day progress note. Patient is medically stable, pending SNF placement.   ?Patrick Bihm, DO ?Page [phone] with questions about this patient.  ?  ?

## 2021-12-19 NOTE — [place] FL2 (Signed)
?[place] MEDICAID FL2 LEVEL OF CARE SCREENING TOOL  ?  ? ?IDENTIFICATION  ?Patient Name: ?Patrick [name]. Birthdate: 12/13/1931 Sex: male Admission Date (Current Location): ?12/16/2021  ?[place] and [place] Number: ? [place] ?  Facility and Address:  ?[facility], [place] [address] [place], [place] [address] ?     Provider Number: ?[id number]  ?Attending Physician Name and Address:  ?Martyn Malay, MD ? Relative Name and Phone Number:  ?Joanna Hall, [phone] ?   ?Current Level of Care: ?Hospital Recommended Level of Care: ?[place] Prior Approval Number: ?  ? ?Date Approved/Denied: ?  PASRR Number: ?[id number] A ? ?Discharge Plan: ?SNF ?  ? ?Current Diagnoses: ?Patient Active Problem List  ? Diagnosis Date Noted  ? Protein-calorie malnutrition, severe 12/18/2021  ? Acute kidney insufficiency 12/18/2021  ? HTN (hypertension) 12/16/2021  ? Unstable angina pectoris ([place]) 07/14/2020  ? Mild protein-calorie malnutrition ([place]) 07/14/2020  ? Mild major depression ([place]) 01/10/2020  ? CKD (chronic kidney disease) stage 3, GFR 30-59 ml/min (HCC) 04/05/2019  ? Metabolic acidosis, normal anion gap (NAG) 07/27/2018  ? Dementia without behavioral disturbance ([place]) 07/27/2018  ? Glaucoma 07/27/2018  ? Secondary hyperparathyroidism ([place]) 11/26/2016  ? Elevated prostate specific antigen (PSA) 09/08/2015  ? Bone pain 06/13/2015  ? Spinal stenosis 06/13/2015  ? OP (osteoporosis) 03/20/2015  ? Allergic rhinitis 03/20/2015  ? Lumbar canal stenosis 03/20/2015  ? Vitamin D deficiency 03/20/2015  ? Restless legs syndrome 03/20/2015  ? Drug-induced gynecomastia 03/20/2015  ? Diaphragmatic hernia 03/20/2015  ? Acquired cyst of kidney 03/20/2015  ? Left ventricular hypertrophy by electrocardiogram 03/20/2015  ? Leg pain 03/20/2015  ? History of pneumonia 03/20/2015  ? Non-rheumatic tricuspid valve insufficiency 03/20/2015  ? Dysmetabolic syndrome 03/54/6568  ? Pulmonary hypertension ([place])  10/31/2014  ? Benign essential HTN 07/29/2014  ? Dyslipidemia 07/29/2014  ? PVD (peripheral vascular disease) ([place])   ? Anemia of chronic disease   ? IBS (irritable bowel syndrome)   ? Bradycardia   ? ED (erectile dysfunction) of organic origin 06/29/2012  ? Genuine stress incontinence, male 06/29/2012  ? Malignant neoplasm of prostate ([place]) 06/29/2012  ? ? ?Orientation RESPIRATION BLADDER Height & Weight   ?  ?Self ? Normal Incontinent, External catheter Weight: 149 lb 0.5 oz (67.6 kg) ?Height:  '6\' 2"'$  (188 cm)  ?BEHAVIORAL SYMPTOMS/MOOD NEUROLOGICAL BOWEL NUTRITION STATUS  ?    Continent Diet (See DC summary)  ?AMBULATORY STATUS COMMUNICATION OF NEEDS Skin   ?Supervision Verbally Normal ?  ?  ?  ?    ?     ?     ? ? ?Personal Care Assistance Level of Assistance  ?Bathing, Feeding, Dressing Bathing Assistance: Limited assistance ?Feeding assistance: Independent ?Dressing Assistance: Limited assistance ?   ? ?Functional Limitations Info  ?Sight, Hearing, Speech Sight Info: Adequate ?Hearing Info: Adequate ?Speech Info: Adequate  ? ? ?SPECIAL CARE FACTORS FREQUENCY  ?PT (By licensed PT), OT (By licensed OT)   ?  ?PT Frequency: 5x week ?OT Frequency: 5x week ?  ?  ?  ?   ? ? ?Contractures Contractures Info: Not present  ? ? ?Additional Factors Info  ?Code Status, Allergies Code Status Info: DNR ?Allergies Info: Ace Inhibitors, Aricept ?  ?  ?  ?   ? ?Current Medications (12/19/2021):  This is the current hospital active medication list ?Current Facility-Administered Medications  ?Medication Dose Route Frequency Provider Last Rate Last Admin  ? amLODipine (NORVASC) tablet 10 mg  10 mg Oral Daily Sharion Settler, DO   10 mg at 12/19/21 0934  ? atorvastatin (LIPITOR) tablet 40 mg  40 mg Oral QPM Zigmund Daniel, Allee, MD   40 mg at 12/18/21 1705  ? carvedilol (COREG) tablet 6.25 mg  6.25 mg Oral BID WC Reesa Chew, MD   6.25 mg at 12/19/21 0934  ? feeding supplement (ENSURE ENLIVE / ENSURE PLUS) liquid 237 mL  237 mL  Oral TID BM Martyn Malay, MD   237 mL at 12/19/21 0934  ? heparin injection 5,000 Units  5,000 Units Subcutaneous Q8H Erskine Emery, MD   5,000 Units at 12/19/21 0540  ? hydrALAZINE (APRESOLINE) tablet 50 mg  50 mg Oral TID Holley Bouche, MD   50 mg at 12/19/21 0934  ? polyethylene glycol (MIRALAX / GLYCOLAX) packet 17 g  17 g Oral Daily Ganta, Anupa, DO   17 g at 12/19/21 0934  ? ? ? ?Discharge Medications: ?Please see discharge summary for a list of discharge medications. ? ?Relevant Imaging Results: ? ?Relevant Lab Results: ? ? ?Additional Information ?SS# [id number] ? ?Coralee Pesa, LCSWA ? ? ? ? ?

## 2021-12-19 NOTE — Progress Notes (Signed)
FPTS Interim Progress Note ? ?S:Went to bedside to check on patient, patient sleeping and I did not awaken him.  ? ?O: ?BP (!) 155/70 (BP Location: Right Arm)   Pulse 60   Temp 98 ?F (36.7 ?C) (Oral)   Resp 18   Ht '6\' 2"'$  (1.88 m)   Wt 67.6 kg   SpO2 99%   BMI 19.13 kg/m?   ?General: Patient resting comfortably, in no acute distress. ?Resp: normal work of breathing noted  ? ?A/P: ?Vitals and orders reviewed. Awaiting next bladder scans, nurse shares that his recent bladder scans have been good. No changes to treatment plan per day team.  ? Donney Dice, DO ?12/19/2021, 1:19 AM ?PGY-2, [place] ?Service pager [phone]  ?

## 2021-12-19 NOTE — TOC Initial Note (Addendum)
Transition of Care (TOC) - Initial/Assessment Note  ? ? ?Patient Details  ?Name: Patrick [name]. ?MRN: [id number] ?Date of Birth: December 13, 1931 ? ?Transition of Care (TOC) CM/SW Contact:    ?Paulene Floor Samarion Ehle, LCSWA ?Phone Number: ?12/19/2021, 10:37 AM ? ?Clinical Narrative:                 ?CSW received consult for possible SNF placement at time of discharge. CSW spoke with patient's spouse as patient is only oriented to person.  Patient's spouse expressed understanding of PT recommendation and is agreeable to SNF placement at time of discharge and reports preference for a facility in [place]. CSW discussed insurance authorization process and will provide Medicare SNF ratings list. Patient has received 5 COVID vaccines. CSW will send out referrals for review.  No further questions reported at this time.  ? ?Facility will have to begin insurance auth once the patient receives bed offers. ? ?Skilled Nursing Rehab Facilities-   [email or web address]   Ratings out of 5 possible   ?Name Address  Phone # Quality Care Staffing Health Inspection Overall  ?[facility] [address] [place] '5 5 2 4  '$ ?[place]  [address], [place] [phone] '4 2 5 5  '$ ?[facility] [place], [place] '4 1 1 1  '$ ?[facility] [address], [place] '2 2 4 4  '$ ?[facility] [address], [place] '1 1 2 1  '$ ?[place] [phone] '2 1 4 3  '$ ?[place] '5 2 2 3  '$ ?[facility] [address] [place] '5 2 2 3  '$ ?[facility] [phone] '5 1 2 2  '$ ?[facility] [address], [place] [phone] '4 1 1 1  '$ ?[facility] [address], [place] [phone] '4 1 2 1  '$ ?[facility]  [phone] '4 1 1 1  '$ ?        ?[place], [place]      ?[facility] '4 2 3 3  '$ ?[facility] [place] '3 1 5 4  '$ ?[place] [address] [place] [address], [place] [phone] '2 1 1 1  '$ ?[facility] [address] [place] [phone] '2 2 3 3  '$ ?        ?[address] (no [facility]) [address], [place] [phone] '4 5 5 5  '$ ?[facility] (No Humana) [address], [place] '4 1 4 3  '$ ?[place] (No UHC) [address], [place] [phone] '5 5 5 5  '$ ?[facility] [address] [employer] [phone] '3 2 4 4  '$ ?[facility] [address] [phone] '3 3 4 4  '$ ?[place] [address], [place] '1 1 2 1  '$ ?[facility] [address] [place] [phone] '2 1 1 1  '$ ?[facility] [address] [place] [phone] '5 2 4 5  '$ ?[facility] [address] [place] '3 1 1 1  '$ ?[facility] [place], [place] '2 1 2 1  '$ ?        ?[facility] [place] '1 1 1 1  '$ ?[place] [address], [place]  [phone] '2 4 2 2  '$ ?[facility] [place] [address], [place] [phone] '5 2 3 4  '$ ?[place] [address], [place] '2 1 1 1  '$ ?[place] (No Humana) [address] [place] '2 1 3 2  '$ ?[facility] [address], [place] [phone] '3 1 1 1  '$ ?        ?[facility], [place] '5 4 5 5  '$ ?[facility])  [address], [place] '2 2 3 3  '$ ?[facility]) [place], [place] '3 2 4 4  '$ ?[place] [address] [place] '4 3 4 4  '$ ?[address] [place], [place] '3 3 1 1  '$ ?[facility]) [address] [place] [phone] '2 2 4 4  '$ ?  ? ?Expected Discharge Plan: [place] ?Barriers to Discharge: Ship broker, SNF Pending bed offer ? ? ?Patient Goals and CMS Choice ?  ?CMS Medicare.gov Compare Post Acute Care list provided to:: Patient Represenative (must comment) ?Choice offered to / list presented to : Spouse ? ?Expected Discharge Plan and Services ?Expected Discharge Plan: [place] ?  ?  ?  ?Living arrangements for the past 2 months: Apartment ?                ?  ?  ?  ?  ?  ?  ?  ?  ?  ?  ? ?Prior Living Arrangements/Services ?Living arrangements for the past 2 months: Apartment ?Lives with:: Spouse ?Patient language and need for interpreter reviewed:: Yes ?       ?  ?Care giver support system in place?: Yes (comment) ?  ?Criminal Activity/Legal Involvement Pertinent to Current Situation/Hospitalization: No - Comment as needed ? ?Activities of Daily Living ?Home Assistive Devices/Equipment: None ?ADL Screening (condition at time of admission) ?Patient's cognitive ability adequate to safely complete daily activities?: No ?Is the patient deaf or have difficulty hearing?: No ?Does the patient have difficulty seeing, even when wearing glasses/contacts?: No ?Does the patient have difficulty concentrating, remembering, or making decisions?: Yes ?Patient able to express need for assistance with ADLs?: Yes ?Does the patient have difficulty dressing or bathing?: Yes ?Independently performs ADLs?: No ?Communication: Independent ?Dressing (OT): Needs assistance ?Is this a change from baseline?: Pre-admission baseline ?Feeding: Needs assistance ?Is this a change from baseline?: Pre-admission baseline ?Toileting: Needs assistance ?Does the patient have difficulty walking or climbing stairs?: Yes ?Weakness of Legs: Both ?Weakness of Arms/Hands: Both ? ?Permission Sought/Granted ?  ?Permission granted to share information with : Yes, Verbal  Permission Granted ?   ? Permission granted to share info w AGENCY: SNF per spouse ?   ?   ? ?Emotional Assessment ?  ?  ?  ?Orientation: : Oriented to Self ?Alcohol / Substance Use: Not Applicable ?Psych Involvement: No (comment) ? ?Admission diagnosis:  HTN (hypertension) [I10] ?Hypertensive emergency [I16.1] ?Acute renal failure superimposed on stage 5 chronic kidney disease, not on chronic dialysis, unspecified acute renal failure type ([place]) [N17.9, N18.5] ?Patient Active Problem List  ? Diagnosis Date Noted  ? Protein-calorie malnutrition, severe 12/18/2021  ? Acute kidney insufficiency 12/18/2021  ? HTN (hypertension) 12/16/2021  ? Unstable angina pectoris ([place]) 07/14/2020  ? Mild protein-calorie malnutrition ([place]) 07/14/2020  ? Mild major depression ([place]) 01/10/2020  ? CKD (chronic kidney disease) stage 3, GFR 30-59 ml/min (HCC) 04/05/2019  ? Metabolic acidosis, normal anion gap (NAG) 07/27/2018  ?  Dementia without behavioral disturbance ([place]) 07/27/2018  ? Glaucoma 07/27/2018  ? Secondary hyperparathyroidism ([place]) 11/26/2016  ? Elevated prostate specific antigen (PSA) 09/08/2015  ? Bone pain 06/13/2015  ? Spinal stenosis 06/13/2015  ? OP (osteoporosis) 03/20/2015  ? Allergic rhinitis 03/20/2015  ? Lumbar canal stenosis 03/20/2015  ? Vitamin D deficiency 03/20/2015  ? Restless legs syndrome 03/20/2015  ? Drug-induced gynecomastia 03/20/2015  ? Diaphragmatic hernia 03/20/2015  ? Acquired cyst of kidney 03/20/2015  ? Left ventricular hypertrophy by electrocardiogram 03/20/2015  ? Leg pain 03/20/2015  ? History of pneumonia 03/20/2015  ? Non-rheumatic tricuspid valve insufficiency 03/20/2015  ? Dysmetabolic syndrome 27/02/2375  ? Pulmonary hypertension ([place]) 10/31/2014  ? Benign essential HTN 07/29/2014  ? Dyslipidemia 07/29/2014  ? PVD (peripheral vascular disease) ([place])   ? Anemia of chronic disease   ? IBS (irritable bowel syndrome)   ? Bradycardia   ? ED (erectile dysfunction) of organic origin 06/29/2012  ?  Genuine stress incontinence, male 06/29/2012  ? Malignant neoplasm of prostate ([place]) 06/29/2012  ? ?PCP:  Steele Sizer, MD ?Pharmacy:   ?[facility] [place] - [address]

## 2021-12-19 NOTE — Progress Notes (Signed)
FPTS Interim Progress Note ? ?Spoke to wife about the patient, decision for SNF vs home health discussed. Patient has acutely worsened in ADL and abilities around the house and does not have sufficient assistance for his safety while in home. SNF does appear to be the best option. He is now medically stable for SNF placement, as nephrology has given further recommendations. TOC to assist with placement.  ? ?Erskine Emery, MD ?12/19/2021, 4:57 PM ?PGY-1, [place] ?Service pager [phone] ? ?

## 2021-12-19 NOTE — Progress Notes (Signed)
Family Medicine Teaching Service ?Daily Progress Note ?Intern Pager: [phone] ? ?Patient name: Patrick [name]. Medical record number: [id number] ?Date of birth: 1932/07/12 Age: 88 y.o. Gender: male ? ?Primary Care Provider: Steele Sizer, MD ?Consultants: Nephrology, Palliative  ?Code Status: DNR  ? ?Pt Overview and Major Events to Date:  ?12/16/21: Admitted to [facility]  ? ?Assessment and Plan: ? ?Patrick [name]. is a 88 y.o. male presenting with uncontrolled HTN, previous fall, unstable ambulation, new AKI. PMHx is significant for HTN, CKD, dementia, glaucoma, dyslipidemia, h/o prostate cancer, osteoporosis, protein calorie malnutrition, ACD, pulmonary HTN.  ? ?AKI on CKD stage IV ?Nephrology is following, creatinine bumped today 5.18 > 5.4 with GFR 10.  Output documented at 700 mL with 1 undocumented void, p.o. intake approximately 1 L over the last 24 hours which is improved from yesterday.  Electrolytes remain within limits.  No signs of uremia this a.m., will follow-up with nephrology for recommendations.  Bladder scan most recently 60 mL. ?- Encourage p.o. intake ?- Appreciate recommendations from nephrology ?- AM BMP ?- Bladder scans every 8 hours with strict ins and outs ?- Avoid nephrotoxic agents ? ?Uncontrolled hypertension ?Blood pressure this morning 150s to 160s/70s with heart rate in the 60s.  Carvedilol increased this morning, monitor for bradycardic events. ?- Continue with amlodipine 10 mg daily, carvedilol 6.25 mg twice daily, hydralazine 50 mg 3 times daily ?- Monitor vital signs ? ?Pancytopenia, stable  ?White blood cell count up to 4, hemoglobin 8.9, platelets 109.  Upon chart review, he has had pancytopenia for quite some time.  Smear reveals hypersegmented neutrophils consistent with anemia, no schistocytosis, no blasts present.  No active signs of bleed on exam today.  This appears to be secondary to bone marrow failure. ?- Monitor with CBC ? ?Alzheimer's disease falls at home,  difficulty with ambulation ?Protein calorie malnutrition ?PT and OT recommend SNF, with shared decision making with wife, this appears to be the best idea.  Continue with assistance with meals and offering water regularly.  ?- PT and OT following ?- Water offered every 4 hours ?- Regular diet without fluid restriction ?- Assistance with meals ?- Fluid resuscitation as needed ?- Ensure with meals ?- MiraLAX to assist with constipation ? ?Patrick  ?Palliative is following inpatient, wife has decided to continue with SNF, possibly needs long-term placement.  Patient is DNR, and wife understands prognosis.  We will continue these discussions. ?- Palliative following, appreciate recommendations ?- Continue conversations with wife ?- Likely to go to SNF in the next couple of days  ? ?FEN/GI: Regular diet ?PPx: Subq hep  ?Dispo: Disposition to SNF pending further nephrology recommendations, should be optimized for placement in 1-2 days.  ? ?Subjective:  ?Patient denies any complaints this morning.  He does not have any pain. ? ?Objective: ?Temp:  [97.4 ?F (36.3 ?C)-98 ?F (36.7 ?C)] 97.5 ?F (36.4 ?C) (04/12 0449) ?Pulse Rate:  [60-70] 61 (04/12 0449) ?Resp:  [15-18] 18 (04/12 0449) ?BP: (148-180)/(64-90) 165/78 (04/12 0449) ?SpO2:  [99 %-100 %] 100 % (04/12 0449) ?Physical Exam: ?General: No acute distress, nontoxic, groggy ?Cardiovascular: Regular rate and rhythm without murmur, gallop, rub ?Respiratory: Clear to auscultation bilaterally ?Abdomen: Nondistended ?Extremities: FROM ?Neuro: No evidence of facial drooping with appropriate responsiveness to stimuli ? ?Laboratory: ?Recent Labs  ?Lab 12/17/21 ?0451 12/18/21 ?0243 12/19/21 ?0308  ?WBC 3.3* 3.3* 4.0  ?HGB 8.5* 8.9* 8.9*  ?HCT 25.9* 26.6* 26.5*  ?PLT 108* 102* 109*  ? ?Recent Labs  ?  Lab 12/16/21 ?1433 12/17/21 ?0451 12/18/21 ?0243 12/19/21 ?0308  ?NA 139 141 140 140  ?K 5.0 4.5 4.6 5.1  ?CL 109 113* 113* 111  ?CO2 21* 21* 18* 22  ?BUN 68* 61* 58* 64*  ?CREATININE 6.45*  5.82* 5.18* 5.40*  ?CALCIUM 10.3 9.9 10.0 10.0  ?PROT 7.7  --   --   --   ?BILITOT 0.7  --   --   --   ?ALKPHOS 54  --   --   --   ?ALT 14  --   --   --   ?AST 11*  --   --   --   ?GLUCOSE 104* 109* 88 105*  ? ? ?Erskine Emery, MD ?12/19/2021, 8:52 AM ?PGY-1, [place] [facility] ?[place] Intern pager: [phone], text pages welcome ? ?

## 2021-12-20 DIAGNOSIS — R627 Adult failure to thrive: Secondary | ICD-10-CM | POA: Diagnosis not present

## 2021-12-20 DIAGNOSIS — N179 Acute kidney failure, unspecified: Secondary | ICD-10-CM | POA: Diagnosis not present

## 2021-12-20 DIAGNOSIS — N184 Chronic kidney disease, stage 4 (severe): Secondary | ICD-10-CM | POA: Diagnosis not present

## 2021-12-20 DIAGNOSIS — E43 Unspecified severe protein-calorie malnutrition: Secondary | ICD-10-CM | POA: Diagnosis not present

## 2021-12-20 DIAGNOSIS — I3139 Other pericardial effusion (noninflammatory): Secondary | ICD-10-CM | POA: Diagnosis not present

## 2021-12-20 DIAGNOSIS — Z66 Do not resuscitate: Secondary | ICD-10-CM | POA: Diagnosis not present

## 2021-12-20 DIAGNOSIS — F0390 Unspecified dementia without behavioral disturbance: Secondary | ICD-10-CM | POA: Diagnosis not present

## 2021-12-20 DIAGNOSIS — I10 Essential (primary) hypertension: Secondary | ICD-10-CM | POA: Diagnosis not present

## 2021-12-20 DIAGNOSIS — W19XXXA Unspecified fall, initial encounter: Secondary | ICD-10-CM | POA: Diagnosis not present

## 2021-12-20 DIAGNOSIS — I272 Pulmonary hypertension, unspecified: Secondary | ICD-10-CM | POA: Diagnosis not present

## 2021-12-20 DIAGNOSIS — I129 Hypertensive chronic kidney disease with stage 1 through stage 4 chronic kidney disease, or unspecified chronic kidney disease: Secondary | ICD-10-CM | POA: Diagnosis not present

## 2021-12-20 DIAGNOSIS — I120 Hypertensive chronic kidney disease with stage 5 chronic kidney disease or end stage renal disease: Secondary | ICD-10-CM | POA: Diagnosis not present

## 2021-12-20 DIAGNOSIS — Z515 Encounter for palliative care: Secondary | ICD-10-CM | POA: Diagnosis not present

## 2021-12-20 DIAGNOSIS — G309 Alzheimer's disease, unspecified: Secondary | ICD-10-CM | POA: Diagnosis not present

## 2021-12-20 DIAGNOSIS — D61818 Other pancytopenia: Secondary | ICD-10-CM | POA: Diagnosis not present

## 2021-12-20 LAB — CBC
HCT: 26.9 % — ABNORMAL LOW (ref 39.0–52.0)
Hemoglobin: 8.9 g/dL — ABNORMAL LOW (ref 13.0–17.0)
MCH: 31.1 pg (ref 26.0–34.0)
MCHC: 33.1 g/dL (ref 30.0–36.0)
MCV: 94.1 fL (ref 80.0–100.0)
Platelets: 101 10*3/uL — ABNORMAL LOW (ref 150–400)
RBC: 2.86 MIL/uL — ABNORMAL LOW (ref 4.22–5.81)
RDW: 12.9 % (ref 11.5–15.5)
WBC: 3 10*3/uL — ABNORMAL LOW (ref 4.0–10.5)
nRBC: 0 % (ref 0.0–0.2)

## 2021-12-20 LAB — BASIC METABOLIC PANEL
Anion gap: 4 — ABNORMAL LOW (ref 5–15)
Anion gap: 6 (ref 5–15)
BUN: 73 mg/dL — ABNORMAL HIGH (ref 8–23)
BUN: 74 mg/dL — ABNORMAL HIGH (ref 8–23)
CO2: 20 mmol/L — ABNORMAL LOW (ref 22–32)
CO2: 22 mmol/L (ref 22–32)
Calcium: 9.6 mg/dL (ref 8.9–10.3)
Calcium: 9.7 mg/dL (ref 8.9–10.3)
Chloride: 113 mmol/L — ABNORMAL HIGH (ref 98–111)
Chloride: 110 mmol/L (ref 98–111)
Creatinine, Ser: 5.24 mg/dL — ABNORMAL HIGH (ref 0.61–1.24)
Creatinine, Ser: 5.58 mg/dL — ABNORMAL HIGH (ref 0.61–1.24)
GFR, Estimated: 10 mL/min — ABNORMAL LOW (ref >60)
GFR, Estimated: 9 mL/min — ABNORMAL LOW (ref >60)
Glucose, Bld: 91 mg/dL (ref 70–99)
Glucose, Bld: 112 mg/dL — ABNORMAL HIGH (ref 70–99)
Potassium: 5.4 mmol/L — ABNORMAL HIGH (ref 3.5–5.1)
Potassium: 5 mmol/L (ref 3.5–5.1)
Sodium: 137 mmol/L (ref 135–145)
Sodium: 138 mmol/L (ref 135–145)

## 2021-12-20 MED ORDER — SODIUM ZIRCONIUM CYCLOSILICATE 10 G PO PACK
10.0000 g | PACK | Freq: Once | ORAL | Status: AC
  Administered 2021-12-20: 10 g via ORAL
  Filled 2021-12-20: qty 1

## 2021-12-20 NOTE — Progress Notes (Signed)
Occupational Therapy Treatment ?Patient Details ?Name: Patrick [name]. ?MRN: [id number] ?DOB: [birth year] ?Today's Date: 12/20/2021 ? ? ?History of present illness 88 y.o. male presenting with uncontrolled HTN, previous fall, unstable ambulation. PMHx is significant for HTN, CKD, dementia, glaucoma, dyslipidemia, h/o prostate cancer, osteoporosis, protein calorie malnutrition, ACD. ?  ?OT comments ? Patient received in bed and agreeable to OT session. Patient asked to use bathroom and had to be reminded of external catheter. Patient required cues for correct hand placement on RW and safety with use. Patient was able to stand at sink ~8 minutes to perform grooming tasks with verbal cues for sequencing. Patient performed LB dressing seated in recliner with supervision to change socks. Patient was pleasant during treatment  but continues to require cues and assistance for safety with ADL tasks and functional transfers.   ? ?Recommendations for follow up therapy are one component of a multi-disciplinary discharge planning process, led by the attending physician.  Recommendations may be updated based on patient status, additional functional criteria and insurance authorization. ?   ?Follow Up Recommendations ? Skilled nursing-short term rehab (<3 hours/day)  ?  ?Assistance Recommended at Discharge Frequent or constant Supervision/Assistance  ?Patient can return home with the following ? A lot of help with walking and/or transfers;A lot of help with bathing/dressing/bathroom;Assistance with feeding;Assistance with cooking/housework;Direct supervision/assist for medications management;Assist for transportation;Direct supervision/assist for financial management;Help with stairs or ramp for entrance ?  ?Equipment Recommendations ? Wheelchair (measurements OT)  ?  ?Recommendations for Other Services   ? ?  ?Precautions / Restrictions Precautions ?Precautions: Fall ?Restrictions ?Weight Bearing Restrictions: No  ? ? ?   ? ?Mobility Bed Mobility ?Overal bed mobility: Needs Assistance ?Bed Mobility: Supine to Sit ?  ?  ?Supine to sit: Min guard ?  ?  ?General bed mobility comments: verbal cues to initiate ?  ? ?Transfers ?Overall transfer level: Needs assistance ?Equipment used: Rolling walker (2 wheels) ?Transfers: Sit to/from Stand ?Sit to Stand: Min assist ?  ?  ?  ?  ?  ?General transfer comment: min assist for walker management and verbal cues for hand placement on walker and when sitting ?  ?  ?Balance Overall balance assessment: Needs assistance, History of Falls ?Sitting-balance support: No upper extremity supported ?Sitting balance-Leahy Scale: Good ?  ?  ?Standing balance support: No upper extremity supported, During functional activity ?Standing balance-Leahy Scale: Poor ?Standing balance comment: no UE support while standing at sink performing grooming tasks with min guard assist for safety ?  ?  ?  ?  ?  ?  ?  ?  ?  ?  ?  ?   ? ?ADL either performed or assessed with clinical judgement  ? ?ADL Overall ADL's : Needs assistance/impaired ?  ?  ?Grooming: Wash/dry hands;Wash/dry face;Oral care;Standing;Min guard;Cueing for sequencing ?Grooming Details (indicate cue type and reason): Patient required cues for sequencing and stood at sink for ~8 minutes at sink ?  ?  ?  ?  ?  ?  ?Lower Body Dressing: Supervision/safety;Sitting/lateral leans ?Lower Body Dressing Details (indicate cue type and reason): changed socks ?Toilet Transfer: Minimal assistance ?Toilet Transfer Details (indicate cue type and reason): simulated to recliner ?  ?  ?  ?  ?  ?General ADL Comments: patient often repeated song lyrics during self care tasks and requiered frequent cues for safety ?  ? ?Extremity/Trunk Assessment   ?  ?  ?  ?  ?  ? ?Vision   ?  ?  ?  Perception   ?  ?Praxis   ?  ? ?Cognition Arousal/Alertness: Awake/alert ?Behavior During Therapy: Flat affect ?Overall Cognitive Status: No family/caregiver present to determine baseline cognitive  functioning ?  ?  ?  ?  ?  ?  ?  ?  ?  ?  ?  ?  ?  ?  ?  ?  ?General Comments: perseverated on song lyrics and required frequent cues for safety and walker use ?  ?  ?   ?Exercises   ? ?  ?Shoulder Instructions   ? ? ?  ?General Comments    ? ? ?Pertinent Vitals/ Pain       Pain Assessment ?Pain Assessment: No/denies pain ? ?Home Living   ?  ?  ?  ?  ?  ?  ?  ?  ?  ?  ?  ?  ?  ?  ?  ?  ?  ?  ? ?  ?Prior Functioning/Environment    ?  ?  ?  ?   ? ?Frequency ? Min 2X/week  ? ? ? ? ?  ?Progress Toward Goals ? ?OT Goals(current goals can now be found in the care plan section) ? Progress towards OT goals: Progressing toward goals ? ?Acute Rehab OT Goals ?OT Goal Formulation: Patient unable to participate in goal setting ?Time For Goal Achievement: 12/31/21 ?Potential to Achieve Goals: Fair ?ADL Goals ?Pt Will Transfer to Toilet: with supervision;ambulating ?Pt Will Perform Toileting - Clothing Manipulation and hygiene: with supervision ?Additional ADL Goal #1: Complete grooming adn ADL task standing at sink x 10 min with S  ?Plan Discharge plan remains appropriate   ? ?Co-evaluation ? ? ?   ?  ?  ?  ?  ? ?  ?AM-PAC OT "6 Clicks" Daily Activity     ?Outcome Measure ? ? Help from another person eating meals?: A Little ?Help from another person taking care of personal grooming?: A Little ?Help from another person toileting, which includes using toliet, bedpan, or urinal?: A Lot ?Help from another person bathing (including washing, rinsing, drying)?: A Lot ?Help from another person to put on and taking off regular upper body clothing?: A Little ?Help from another person to put on and taking off regular lower body clothing?: A Lot ?6 Click Score: 15 ? ?  ?End of Session Equipment Utilized During Treatment: Gait belt;Rolling walker (2 wheels) ? ?OT Visit Diagnosis: Unsteadiness on feet (R26.81);Other abnormalities of gait and mobility (R26.89);Muscle weakness (generalized) (M62.81);History of falling (Z91.81);Other symptoms and  signs involving cognitive function ?  ?Activity Tolerance Patient tolerated treatment well ?  ?Patient Left in chair;with call bell/phone within reach;with chair alarm set ?  ?Nurse Communication Mobility status ?  ? ?   ? ?Time: 3354-5625 ?OT Time Calculation (min): 25 min ? ?Charges: OT General Charges ?$OT Visit: 1 Visit ?OT Treatments ?$Self Care/Home Management : 23-37 mins ? ?Lodema Hong, OTA ?Acute Rehabilitation Services  ?Pager [phone] ?Office [phone] ? ? ?Welcome ?12/20/2021, 12:01 PM ?

## 2021-12-20 NOTE — Discharge Instructions (Signed)
Dear Patrick [name].,  ? ?Thank you so much for allowing [place] to be part of your care!  You were admitted to [facility] for increased falls and difficulty walking.  ? ?You had a kidney injury and high blood pressure that we treated with fluids and medications.  ? ? ?POST-HOSPITAL & CARE INSTRUCTIONS ? ?Please let PCP/Specialists know of any changes that were made.  ?Please see medications section of this packet for any medication changes.  ? ?DOCTOR'S APPOINTMENT & FOLLOW UP CARE INSTRUCTIONS  ?No future appointments. ? ?RETURN PRECAUTIONS: ? ?Please return with chest pain, loss of consciousness, shortness of breath, abnormal change in mental status ? ?Take care and be well! ? ?Family Medicine Teaching Service  ?[place]  ?[facility]  ?[address] [place], [place] [address] ?[phone] ? ?

## 2021-12-20 NOTE — TOC Progression Note (Signed)
Transition of Care (TOC) - Initial/Assessment Note  ? ? ?Patient Details  ?Name: Patrick [name]. ?MRN: [id number] ?Date of Birth: [birth year] ? ?Transition of Care (TOC) CM/SW Contact:    ?Paulene Floor Brelan Hannen, LCSWA ?Phone Number: ?12/20/2021, 8:31 AM ? ?Clinical Narrative:                 ?CSW presented bed offers to the patient's wife.  The wife choose [facility].   ? ?CSW contacted [place] with [facility].  The facility will begin insurance auth today.   ? ?Expected Discharge Plan: [place] ?Barriers to Discharge: Ship broker, SNF Pending bed offer ? ? ?Patient Goals and CMS Choice ?  ?CMS Medicare.gov Compare Post Acute Care list provided to:: Patient Represenative (must comment) ?Choice offered to / list presented to : Spouse ? ?Expected Discharge Plan and Services ?Expected Discharge Plan: [place] ?  ?  ?  ?Living arrangements for the past 2 months: Apartment ?                ?  ?  ?  ?  ?  ?  ?  ?  ?  ?  ? ?Prior Living Arrangements/Services ?Living arrangements for the past 2 months: Apartment ?Lives with:: Spouse ?Patient language and need for interpreter reviewed:: Yes ?       ?  ?Care giver support system in place?: Yes (comment) ?  ?Criminal Activity/Legal Involvement Pertinent to Current Situation/Hospitalization: No - Comment as needed ? ?Activities of Daily Living ?Home Assistive Devices/Equipment: None ?ADL Screening (condition at time of admission) ?Patient's cognitive ability adequate to safely complete daily activities?: No ?Is the patient deaf or have difficulty hearing?: No ?Does the patient have difficulty seeing, even when wearing glasses/contacts?: No ?Does the patient have difficulty concentrating, remembering, or making decisions?: Yes ?Patient able to express need for assistance with ADLs?: Yes ?Does the patient have difficulty dressing or bathing?: Yes ?Independently performs ADLs?: No ?Communication: Independent ?Dressing (OT): Needs assistance ?Is  this a change from baseline?: Pre-admission baseline ?Feeding: Needs assistance ?Is this a change from baseline?: Pre-admission baseline ?Toileting: Needs assistance ?Does the patient have difficulty walking or climbing stairs?: Yes ?Weakness of Legs: Both ?Weakness of Arms/Hands: Both ? ?Permission Sought/Granted ?  ?Permission granted to share information with : Yes, Verbal Permission Granted ?   ? Permission granted to share info w AGENCY: SNF per spouse ?   ?   ? ?Emotional Assessment ?  ?  ?  ?Orientation: : Oriented to Self ?Alcohol / Substance Use: Not Applicable ?Psych Involvement: No (comment) ? ?Admission diagnosis:  HTN (hypertension) [I10] ?Hypertensive emergency [I16.1] ?Acute renal failure superimposed on stage 5 chronic kidney disease, not on chronic dialysis, unspecified acute renal failure type ([place]) [N17.9, N18.5] ?Patient Active Problem List  ? Diagnosis Date Noted  ? Protein-calorie malnutrition, severe 12/18/2021  ? Acute kidney insufficiency 12/18/2021  ? HTN (hypertension) 12/16/2021  ? Unstable angina pectoris ([place]) 07/14/2020  ? Mild protein-calorie malnutrition ([place]) 07/14/2020  ? Mild major depression ([place]) 01/10/2020  ? CKD (chronic kidney disease) stage 3, GFR 30-59 ml/min (HCC) 04/05/2019  ? Metabolic acidosis, normal anion gap (NAG) 07/27/2018  ? Dementia without behavioral disturbance ([place]) 07/27/2018  ? Glaucoma 07/27/2018  ? Secondary hyperparathyroidism ([place]) 11/26/2016  ? Elevated prostate specific antigen (PSA) 09/08/2015  ? Bone pain 06/13/2015  ? Spinal stenosis 06/13/2015  ? OP (osteoporosis) 03/20/2015  ? Allergic rhinitis 03/20/2015  ? Lumbar canal stenosis 03/20/2015  ? Vitamin D  deficiency 03/20/2015  ? Restless legs syndrome 03/20/2015  ? Drug-induced gynecomastia 03/20/2015  ? Diaphragmatic hernia 03/20/2015  ? Acquired cyst of kidney 03/20/2015  ? Left ventricular hypertrophy by electrocardiogram 03/20/2015  ? Leg pain 03/20/2015  ? History of pneumonia 03/20/2015  ?  Non-rheumatic tricuspid valve insufficiency 03/20/2015  ? Dysmetabolic syndrome 26/33/3545  ? Pulmonary hypertension ([place]) 10/31/2014  ? Benign essential HTN 07/29/2014  ? Dyslipidemia 07/29/2014  ? PVD (peripheral vascular disease) ([place])   ? Anemia of chronic disease   ? IBS (irritable bowel syndrome)   ? Bradycardia   ? ED (erectile dysfunction) of organic origin 06/29/2012  ? Genuine stress incontinence, male 06/29/2012  ? Malignant neoplasm of prostate ([place]) 06/29/2012  ? ?PCP:  Steele Sizer, MD ?Pharmacy:   ?[facility] [place] [address] ?[place] [address] ?Phone: [phone] Fax: [phone] ? ?[facility] - [place], [place] - [address] ?[address] ?[place] [address] ?Phone: [phone] Fax: [phone] ? ? ? ? ?Social Determinants of Health (SDOH) Interventions ?  ? ?Readmission Risk Interventions ?   ? View : No data to display.  ?  ?  ?  ? ? ? ?

## 2021-12-20 NOTE — Progress Notes (Addendum)
Family Medicine Teaching Service ?Daily Progress Note ?Intern Pager: [phone] ? ?Patient name: Patrick [name]. Medical record number: [id number] ?Date of birth: 08/06/1932 Age: 88 y.o. Gender: male ? ?Primary Care Provider: Steele Sizer, MD ?Consultants: Nephrology, palliative  ?Code Status: DNR  ? ?Pt Overview and Major Events to Date:  ?12/16/21: Admitted to [facility]  ? ?Assessment and Plan: ?Dom P Loel Betancur. is a 88 y.o. male presenting with uncontrolled HTN, previous fall, unstable ambulation, new AKI. PMHx is significant for HTN, CKD, dementia, glaucoma, dyslipidemia, h/o prostate cancer, osteoporosis, protein calorie malnutrition, ACD, pulmonary HTN.  ? ?AKI on CKD stage IV ?Patient's creatinine has improved today from 5.4 > 5.24.  GFR of 10.  Nephrology has followed throughout this admission and has given input.  Last recommendation was to continue encouraging oral hydration and to have him follow-up outpatient as new creatinine may be new baseline without need to keep him inpatient for increased creatinine.  Urine output 850 mL.  Unfortunately, the patient is experiencing hyperkalemia to 5.4 (see below). ?- Appreciate nephrology recommendations ?- Monitor urine output ?- Encourage oral hydration ?- Follow-up with [facility] outpatient ? ?Hyperkalemia ?Patient has a potassium of 5.4 from 5.1 yesterday.  Patient had liberalized diet to encourage p.o. intake yesterday.  However, I believe this was probably the cause of his increase in potassium.  I have changed back to a renal diet without fluid restriction, Lokelma was given as well.  We will follow-up on an EKG and a 1500 BMP. If elevated K tomorrow, would consider prolonged course of Lokelma daily.  ?- Renal diet without fluids restrictions  ?- EKG pending  ?- BMP'@1500'$   ?- s/p lokelma 10 mEq ? ?Pancytopenia, stable ?White blood cell count 4 > 3, hemoglobin 8.9, platelets 109 > 101.  B12 and folate within normal limits with smear showing  hypersegmented neutrophils.  MMA sent, likely needs to be further worked up outpatient if indicated. ?- MMA pending ? ?Pericardial effusion  pulmonary hypertension ?Present on echo from October 2022 and echo updated from this admission.  Bedside ultrasound performed yesterday, appears to be small circumferential pericardial effusion and stable.  Would need to follow-up with cardiology outpatient for this. ? ?Alzheimer's disease falls at home, difficulty with ambulation ?Protein calorie malnutrition ?GOC ?PT and OT have recommended SNF, I have been in conversations with wife and she agrees.  Patient has had a gradual decline and inability to take care of himself and get around the house.  We will work with to see for possible SNF placements when patient is medically cleared. ? ?Uncontrolled hypertension ?Patient's blood pressures this morning have been 150s/70s.  We currently have him on amlodipine 10 mg daily, carvedilol 6.25 mg twice daily, and hydralazine 50 mg 3 times daily. ?- Continue with regimen as above ?- Monitor vital signs ? ?FEN/GI: Renal diet  ?PPx: Subq hep  ?Dispo:SNF tomorrow. Barriers include resolving electrolyte abnormalities.  ? ?Subjective:  ?Patient has no complaints this morning, will try to eat his breakfast.  ? ?Objective: ?Temp:  [97.6 ?F (36.4 ?C)-98.2 ?F (36.8 ?C)] 98 ?F (36.7 ?C) (04/13 0519) ?Pulse Rate:  [55-78] 59 (04/13 0519) ?Resp:  [15-18] 18 (04/13 0519) ?BP: (154-192)/(68-85) 154/78 (04/13 7782) ?SpO2:  [97 %-100 %] 97 % (04/13 0519) ?Physical Exam: ?General: Alert, NAD, groggy but easy to arouse  ?Heart: Regular rate and rhythm with no murmurs appreciated ?Lungs: CTA bilaterally, no wheezing ?Skin: Warm and dry ?Extremities: No lower extremity edema ?Neuro: no facial  drooping, murmured answers to verbal stimuli ? ? ?Laboratory: ?Recent Labs  ?Lab 12/18/21 ?0243 12/19/21 ?0308 12/20/21 ?0315  ?WBC 3.3* 4.0 3.0*  ?HGB 8.9* 8.9* 8.9*  ?HCT 26.6* 26.5* 26.9*  ?PLT 102* 109* 101*   ? ?Recent Labs  ?Lab 12/16/21 ?1433 12/17/21 ?0451 12/18/21 ?0243 12/19/21 ?0308 12/20/21 ?0315  ?NA 139   < > 140 140 137  ?K 5.0   < > 4.6 5.1 5.4*  ?CL 109   < > 113* 111 113*  ?CO2 21*   < > 18* 22 20*  ?BUN 68*   < > 58* 64* 73*  ?CREATININE 6.45*   < > 5.18* 5.40* 5.24*  ?CALCIUM 10.3   < > 10.0 10.0 9.6  ?PROT 7.7  --   --   --   --   ?BILITOT 0.7  --   --   --   --   ?ALKPHOS 54  --   --   --   --   ?ALT 14  --   --   --   --   ?AST 11*  --   --   --   --   ?GLUCOSE 104*   < > 88 105* 91  ? < > = values in this interval not displayed.  ? ? ? ? ?Erskine Emery, MD ?12/20/2021, 8:40 AM ?PGY-1, [facility] ?[place] Intern pager: [phone], text pages welcome ? ?

## 2021-12-20 NOTE — Progress Notes (Addendum)
Nephrology Follow-Up Consult note ? ? ?Assessment/Recommendations: Patrick [name]. is a/an 88 y.o. male with a past medical history significant for HTN, CKD IV, dementia, malnutrition who present w/ fall and FTT c/b AKI  ?  ?AKI on CKD IV: CKD multifactorial but notably large polycystic kidneys on RUS. AKI likely non-oliguric. Maybe some dehydration that improved with supportive care. No obvious uremic signs and more c/w baseline dementia ?-Creatinine seems to have plateaued around 5.4.  There could be some ongoing dehydration component likely related to poor p.o. intake for the patient.  I do not think further IV fluids are necessary at this time and would encourage p.o. intake as this will be his main source of hydration after he leaves the hospital.  IV fluids would only be a Band-Aid in the interim ?-His creatinine may not get much better but could slowly downtrend with time if the patient maintains his hydration.  I do not think he has to stay in the hospital for his kidney function alone ?-Appreciate the help from palliative care.  Given his failure to thrive I do not think he is a good long-term dialysis candidate.  I agree with possibly enrolling help from hospice to help with the patient's symptoms. ?-Agree with [facility] today for mild hyperkalemia.  Do not think he needs to discharge on this medication.  Could be used as needed but also would consider minimizing interventions going forward ?-Continue to monitor daily Cr, Dose meds for GFR ?-Monitor Daily I/Os, Daily weight  ?-Maintain MAP>65 for optimal renal perfusion.  ?-Avoid nephrotoxic medications including NSAIDs ?-Use synthetic opioids (Fentanyl/Dilaudid) if needed ?  ?Fall/FTT: Significant weight loss in the outpatient setting.  Patient appears to be declining. Pal care involved and appreciate help.  Patient likely discharging to SNF.  May benefit from hospice care in future ?  ?Hypertension: Worsened in the setting of noncompliance.  Blood  pressure continues to be up and down.  Given the patient's overall goals of care I would maintain medications at current doses ?  ?Pancytopenia: Anemia likely multifactorial but unclear cause of leukopenia and thrombocytopenia.  Management per primary.  With his weight loss I think underlying malignancy would be a concern ? ?Moderate pericardial effusion: noted on echo.  Noted to be stable from previous imaging ? ?Dispo: snf.  The patient is stable for discharge from nephrology standpoint.  We will sign off at this time.  We will be available outpatient to follow-up and have further conversations about his kidney dysfunction and goals of care if needed.  However, if the patient does later enlist hospice services I do not think that we need to be heavily involved ? ? ?Recommendations conveyed to primary service.  ? ? ?Reesa Chew ?[facility] ?12/20/2021 ?9:16 AM ? ?___________________________________________________________ ? ?CC: aki, ftt, fall ? ?Interval History/Subjective: Patient awake and alert today eating breakfast.  States that he feels really good.  We discussed that he is likely leaving the hospital soon. ? ?The patient and I had a good conversation about his kidney function and plans going forward.  We discussed that because of his age and multiple medical conditions dialysis is probably not the best option for him.  I think that ongoing supportive care is the best option the patient seemed understand but it is obvious that his insight is fairly limited because of his dementia. ? ? ?Medications:  ?Current Facility-Administered Medications  ?Medication Dose Route Frequency Provider Last Rate Last Admin  ? amLODipine (NORVASC) tablet  10 mg  10 mg Oral Daily Sharion Settler, DO   10 mg at 12/20/21 0845  ? atorvastatin (LIPITOR) tablet 40 mg  40 mg Oral QPM Zigmund Daniel, Allee, MD   40 mg at 12/19/21 1713  ? carvedilol (COREG) tablet 6.25 mg  6.25 mg Oral BID WC Reesa Chew, MD   6.25  mg at 12/20/21 0848  ? feeding supplement (ENSURE ENLIVE / ENSURE PLUS) liquid 237 mL  237 mL Oral TID BM Martyn Malay, MD   237 mL at 12/20/21 0847  ? heparin injection 5,000 Units  5,000 Units Subcutaneous Q8H Erskine Emery, MD   5,000 Units at 12/20/21 0515  ? hydrALAZINE (APRESOLINE) tablet 50 mg  50 mg Oral TID Holley Bouche, MD   50 mg at 12/20/21 0846  ? polyethylene glycol (MIRALAX / GLYCOLAX) packet 17 g  17 g Oral Daily Ganta, Anupa, DO   17 g at 12/20/21 0847  ?  ? ? ?Review of Systems: ?10 systems reviewed and negative except per interval history/subjective ? ?Physical Exam: ?Vitals:  ? 12/20/21 0519 12/20/21 0641  ?BP: (!) 192/79 (!) 154/78  ?Pulse: (!) 59   ?Resp: 18   ?Temp: 98 ?F (36.7 ?C)   ?SpO2: 97%   ? ?Total I/O ?In: 360 [P.O.:360] ?Out: -  ? ?Intake/Output Summary (Last 24 hours) at 12/20/2021 0916 ?Last data filed at 12/20/2021 0851 ?Gross per 24 hour  ?Intake 1277 ml  ?Output 850 ml  ?Net 427 ml  ? ?Constitutional: Chronically ill-appearing, no distress ?ENMT: ears and nose without scars or lesions, MMM ?CV: normal rate, trace edema in ankle ?Respiratory: Bilateral chest rise normal work of breathing ?Gastrointestinal: soft, non-tender, no palpable masses or hernias ?Skin: no visible lesions or rashes ?Psych: alert, appropriate mood and affect ? ? ?Test Results ?I personally reviewed new and old clinical labs and radiology tests ?Lab Results  ?Component Value Date  ? NA 137 12/20/2021  ? K 5.4 (H) 12/20/2021  ? CL 113 (H) 12/20/2021  ? CO2 20 (L) 12/20/2021  ? BUN 73 (H) 12/20/2021  ? CREATININE 5.24 (H) 12/20/2021  ? CALCIUM 9.6 12/20/2021  ? ALBUMIN 3.2 (L) 12/17/2021  ? PHOS 4.1 12/17/2021  ? ? ?CBC ?Recent Labs  ?Lab 12/16/21 ?1433 12/17/21 ?0451 12/18/21 ?0243 12/19/21 ?0308 12/20/21 ?0315  ?WBC 2.9* 3.3* 3.3* 4.0 3.0*  ?NEUTROABS 1.9 2.2  --   --   --   ?HGB 10.1* 8.5* 8.9* 8.9* 8.9*  ?HCT 30.3* 25.9* 26.6* 26.5* 26.9*  ?MCV 95.6 94.9 95.0 93.0 94.1  ?PLT 128* 108* 102* 109* 101*   ? ? ? ? ? ?

## 2021-12-20 NOTE — Progress Notes (Signed)
FPTS Brief Note ?Reviewed patient's vitals, recent notes.  ?Vitals:  ? 12/20/21 1631 12/20/21 1941  ?BP: 136/67 (!) 141/47  ?Pulse: (!) 53 (!) 53  ?Resp: 16 16  ?Temp: (!) 97.3 ?F (36.3 ?C) 97.9 ?F (36.6 ?C)  ?SpO2: 100% 100%  ? ?At this time, no change in plan from day progress note. Will continue to monitor HR.  ?Larae Grooms, Mahima Hottle, DO ?Page [phone] with questions about this patient.  ?  ?

## 2021-12-21 ENCOUNTER — Telehealth: Payer: Self-pay | Admitting: Family Medicine

## 2021-12-21 DIAGNOSIS — F0390 Unspecified dementia without behavioral disturbance: Secondary | ICD-10-CM | POA: Diagnosis not present

## 2021-12-21 DIAGNOSIS — N179 Acute kidney failure, unspecified: Secondary | ICD-10-CM | POA: Diagnosis not present

## 2021-12-21 DIAGNOSIS — I10 Essential (primary) hypertension: Secondary | ICD-10-CM | POA: Diagnosis not present

## 2021-12-21 LAB — BASIC METABOLIC PANEL
Anion gap: 7 (ref 5–15)
BUN: 81 mg/dL — ABNORMAL HIGH (ref 8–23)
CO2: 22 mmol/L (ref 22–32)
Calcium: 9.8 mg/dL (ref 8.9–10.3)
Chloride: 107 mmol/L (ref 98–111)
Creatinine, Ser: 5.77 mg/dL — ABNORMAL HIGH (ref 0.61–1.24)
GFR, Estimated: 9 mL/min — ABNORMAL LOW (ref >60)
Glucose, Bld: 103 mg/dL — ABNORMAL HIGH (ref 70–99)
Potassium: 4.9 mmol/L (ref 3.5–5.1)
Sodium: 136 mmol/L (ref 135–145)

## 2021-12-21 MED ORDER — CARVEDILOL 6.25 MG PO TABS: 6.2500 mg | ORAL_TABLET | Freq: Two times a day (BID) | ORAL | Status: DC

## 2021-12-21 MED ORDER — CARVEDILOL 3.125 MG PO TABS
3.1250 mg | ORAL_TABLET | Freq: Two times a day (BID) | ORAL | Status: DC
Start: 2021-12-21 — End: 2021-12-25
  Administered 2021-12-21 – 2021-12-24 (×7): 3.125 mg via ORAL
  Filled 2021-12-21 (×8): qty 1

## 2021-12-21 NOTE — Progress Notes (Signed)
Family Medicine Teaching Service ?Daily Progress Note ?Intern Pager: [phone] ? ?Patient name: Patrick [name]. Medical record number: [id number] ?Date of birth: 1932/09/03 Age: 88 y.o. Gender: male ? ?Primary Care Provider: Steele Sizer, MD ?Consultants: Nephrology, Palliative Care ?Code Status: DNR ? ?Pt Overview and Major Events to Date:  ?4/9- admitted ? ?Assessment and Plan: ?Patrick [name]. is a 88 y.o. male presenting with uncontrolled HTN, previous fall, unstable ambulation, new AKI. PMHx is significant for HTN, CKD, dementia, glaucoma, dyslipidemia, h/o prostate cancer, osteoporosis, protein calorie malnutrition, ACD, pulmonary HTN.  ?  ?AKI on CKD IV  Hyperkalemia, resolved ?No BMP today due to lab holiday. UOP ON 821m. Will check BMP tomorrow if patient is still here. Patient is stable for discharge to SNF. ?- BMP on Sunday ?- Continue to monitor I/O and daily weights ?- Goal MAP >65 for optimal renal perfusion ?- Encourage PO intake ?- Avoid nephrotoxic agents ? ?Pancytopenia, stable ?Lab holiday.  ?- CBC on Sunday if still here ? ?Alzheimer's disease  Falls at home  Difficulty with ambulation ?Protein Calorie Malnutrition ?GOC ?Patient continues working with PT/OT on an inpt basis. Awaiting SNF.  ?- PT/OT ?- Encourage PO ?- Ensure supplements with meals TID ? ?HTN ?BPs 130-150s/60s-80s. HR modestly improved with Coreg dose reduction yesterday (high 50s, up from low 50s).  ?- Continue Coreg 3.'125mg'$  BID ?- Continue home Amlodipine and Hydralazine ? ?Pericardial Effusion  Pulmonary HTN ?Stable. ?- Outpt follow-up ? ?FEN/GI: Renal diet ?PPx: SQ heparin  ?Dispo:SNF. Barriers include placement.  ? ?Subjective:  ?Mr. SMortimerhas no complaints at this time. Is eager to discharge to SNF when a bed comes available.  ? ?Objective: ?Temp:  [97.9 ?F (36.6 ?C)-98.4 ?F (36.9 ?C)] 97.9 ?F (36.6 ?C) (04/15 01245 ?Pulse Rate:  [55-60] 59 (04/15 0437) ?Resp:  [15-19] 15 (04/15 0437) ?BP: (130-176)/(56-86)  176/73 (04/15 0437) ?SpO2:  [97 %-100 %] 97 % (04/15 0437) ?Physical Exam: ?General: Sleeping on arrival, awakens easily to voice ?Cardiovascular: Regular rate and rhythm, no murmur ?Respiratory: Lungs clear, normal WOB on RA ?Abdomen: Non-tender, non-distended ?Extremities: Without LE edema ? ?Laboratory: ?Recent Labs  ?Lab 12/18/21 ?0243 12/19/21 ?0308 12/20/21 ?0315  ?WBC 3.3* 4.0 3.0*  ?HGB 8.9* 8.9* 8.9*  ?HCT 26.6* 26.5* 26.9*  ?PLT 102* 109* 101*  ? ?Recent Labs  ?Lab 12/16/21 ?1433 12/17/21 ?0451 12/20/21 ?0809904/13/23 ?1502 12/21/21 ?0238  ?NA 139   < > 137 138 136  ?K 5.0   < > 5.4* 5.0 4.9  ?CL 109   < > 113* 110 107  ?CO2 21*   < > 20* 22 22  ?BUN 68*   < > 73* 74* 81*  ?CREATININE 6.45*   < > 5.24* 5.58* 5.77*  ?CALCIUM 10.3   < > 9.6 9.7 9.8  ?PROT 7.7  --   --   --   --   ?BILITOT 0.7  --   --   --   --   ?ALKPHOS 54  --   --   --   --   ?ALT 14  --   --   --   --   ?AST 11*  --   --   --   --   ?GLUCOSE 104*   < > 91 112* 103*  ? < > = values in this interval not displayed.  ? ? ?Imaging/Diagnostic Tests: ?No new imaging, tests ? ?SEppie Gibson MD ?12/22/2021, 6:12 AM ?PGY-1, [facility] ?  [place] Intern pager: [phone], text pages welcome ? ?

## 2021-12-21 NOTE — TOC Progression Note (Addendum)
Transition of Care (TOC) - Initial/Assessment Note  ? ? ?Patient Details  ?Name: Patrick [name]. ?MRN: [id number] ?Date of Birth: [birth year] ? ?Transition of Care (TOC) CM/SW Contact:    ?Paulene Floor Eila Runyan, LCSWA ?Phone Number: ?12/21/2021, 1:18 PM ? ?Clinical Narrative:                 ?Insurance authorization is still pending per [place] with admissions at [facility]. ? ?13:30-  CSW was contacted by [place] with admissions at [facility] and informed that the insurance company's computer system was down and the earliest that insurance auth would be received is Monday 12/24/2021. ? ? ?Expected Discharge Plan: [place] ?Barriers to Discharge: Ship broker, SNF Pending bed offer ? ? ?Patient Goals and CMS Choice ?  ?CMS Medicare.gov Compare Post Acute Care list provided to:: Patient Represenative (must comment) ?Choice offered to / list presented to : Spouse ? ?Expected Discharge Plan and Services ?Expected Discharge Plan: [place] ?  ?  ?  ?Living arrangements for the past 2 months: Apartment ?                ?  ?  ?  ?  ?  ?  ?  ?  ?  ?  ? ?Prior Living Arrangements/Services ?Living arrangements for the past 2 months: Apartment ?Lives with:: Spouse ?Patient language and need for interpreter reviewed:: Yes ?       ?  ?Care giver support system in place?: Yes (comment) ?  ?Criminal Activity/Legal Involvement Pertinent to Current Situation/Hospitalization: No - Comment as needed ? ?Activities of Daily Living ?Home Assistive Devices/Equipment: None ?ADL Screening (condition at time of admission) ?Patient's cognitive ability adequate to safely complete daily activities?: No ?Is the patient deaf or have difficulty hearing?: No ?Does the patient have difficulty seeing, even when wearing glasses/contacts?: No ?Does the patient have difficulty concentrating, remembering, or making decisions?: Yes ?Patient able to express need for assistance with ADLs?: Yes ?Does the patient have difficulty  dressing or bathing?: Yes ?Independently performs ADLs?: No ?Communication: Independent ?Dressing (OT): Needs assistance ?Is this a change from baseline?: Pre-admission baseline ?Feeding: Needs assistance ?Is this a change from baseline?: Pre-admission baseline ?Toileting: Needs assistance ?Does the patient have difficulty walking or climbing stairs?: Yes ?Weakness of Legs: Both ?Weakness of Arms/Hands: Both ? ?Permission Sought/Granted ?  ?Permission granted to share information with : Yes, Verbal Permission Granted ?   ? Permission granted to share info w AGENCY: SNF per spouse ?   ?   ? ?Emotional Assessment ?  ?  ?  ?Orientation: : Oriented to Self ?Alcohol / Substance Use: Not Applicable ?Psych Involvement: No (comment) ? ?Admission diagnosis:  HTN (hypertension) [I10] ?Hypertensive emergency [I16.1] ?Acute renal failure superimposed on stage 5 chronic kidney disease, not on chronic dialysis, unspecified acute renal failure type ([place]) [N17.9, N18.5] ?Patient Active Problem List  ? Diagnosis Date Noted  ? Protein-calorie malnutrition, severe 12/18/2021  ? Acute kidney insufficiency 12/18/2021  ? HTN (hypertension) 12/16/2021  ? Unstable angina pectoris ([place]) 07/14/2020  ? Mild protein-calorie malnutrition ([place]) 07/14/2020  ? Mild major depression ([place]) 01/10/2020  ? CKD (chronic kidney disease) stage 3, GFR 30-59 ml/min (HCC) 04/05/2019  ? Metabolic acidosis, normal anion gap (NAG) 07/27/2018  ? Dementia without behavioral disturbance ([place]) 07/27/2018  ? Glaucoma 07/27/2018  ? Secondary hyperparathyroidism ([place]) 11/26/2016  ? Elevated prostate specific antigen (PSA) 09/08/2015  ? Bone pain 06/13/2015  ? Spinal stenosis 06/13/2015  ? OP (osteoporosis) 03/20/2015  ?  Allergic rhinitis 03/20/2015  ? Lumbar canal stenosis 03/20/2015  ? Vitamin D deficiency 03/20/2015  ? Restless legs syndrome 03/20/2015  ? Drug-induced gynecomastia 03/20/2015  ? Diaphragmatic hernia 03/20/2015  ? Acquired cyst of kidney 03/20/2015  ?  Left ventricular hypertrophy by electrocardiogram 03/20/2015  ? Leg pain 03/20/2015  ? History of pneumonia 03/20/2015  ? Non-rheumatic tricuspid valve insufficiency 03/20/2015  ? Dysmetabolic syndrome 09/81/1914  ? Pulmonary hypertension ([place]) 10/31/2014  ? Benign essential HTN 07/29/2014  ? Dyslipidemia 07/29/2014  ? PVD (peripheral vascular disease) ([place])   ? Anemia of chronic disease   ? IBS (irritable bowel syndrome)   ? Bradycardia   ? ED (erectile dysfunction) of organic origin 06/29/2012  ? Genuine stress incontinence, male 06/29/2012  ? Malignant neoplasm of prostate ([place]) 06/29/2012  ? ?PCP:  Steele Sizer, MD ?Pharmacy:   ?[facility] [place] [address] ?[place] ?[place] [address] ?Phone: [phone] Fax: [phone] ? ?[facility] - [place], [place] - [address] ?[address] ?[place] [address] ?Phone: [phone] Fax: [phone] ? ? ? ? ?Social Determinants of Health (SDOH) Interventions ?  ? ?Readmission Risk Interventions ?   ? View : No data to display.  ?  ?  ?  ? ? ? ?

## 2021-12-21 NOTE — Progress Notes (Signed)
Family Medicine Teaching Service ?Daily Progress Note ?Intern Pager: [phone] ? ?Patient name: Patrick [name]. Medical record number: [id number] ?Date of birth: April 20, 1932 Age: 88 y.o. Gender: male ? ?Primary Care Provider: Steele Sizer, MD ?Consultants: Nephrology, palliative  ?Code Status: DNR  ? ?Pt Overview and Major Events to Date:  ?12/16/21: Admitted to [place]  ? ?Assessment and Plan: ? ?Patrick [name]. is a 88 y.o. male presenting with uncontrolled HTN, previous fall, unstable ambulation, new AKI. PMHx is significant for HTN, CKD, dementia, glaucoma, dyslipidemia, h/o prostate cancer, osteoporosis, protein calorie malnutrition, ACD, pulmonary HTN.  ?  ?AKI on CKD stage IV ?Creatinine increased again today from 5.58 > 5.77, patient may be at new baseline.  Nephrology has been following, increasing creatinine should not keep the patient in the hospital as we encourage oral hydration.  Potassium regulated today with electrolytes wnl.  Patient is not a good long-term dialysis candidate, has dementia and poor p.o. intake with failure to thrive. We will space BMPs now that electrolytes have stabilized. Awaiting SNF placement and now medically clear for discharge. ?- Nephrology following, we appreciate their recommendations ?- Monitor daily ins and outs and daily weight ?- Maintain MAP >65 for optimal renal perfusion ?- Avoid nephrotoxic medications  ?- Encourage p.o. intake ? ?Hyperkalemia, resolved ?Resolved with Lokelma, EKG unremarkable.  Encourage hydration. ? ?Pancytopenia, stable ?Counts have been stable since admission.  MMA still pending.  Appears to be in the setting of bone marrow failure. Will avoid needle stick burden by spacing labwork.  ? ?Alzheimer's disease falls at home, difficulty with ambulation ?Protein calorie malnutrition ?GOC ?Awaiting SNF placement as now medically stable for discharge. Continuing to encourage PO intake and discuss [place] conversations with wife. Palliative to see  outpatient.  ?- Ensure with meals  ?- MiraLAX for stools  ?- Encourage PO hydration  ? ?Uncontrolled hypertension ?BP 140-150s/50s, bradycardic after increase in Coreg. Decrease coreg back to dose of 3.125 mg BID. Amlodipine and Hydralazine on regimen as well.  ?- Continue medications as above ? ?Pericardial Effusion  Pulmonary HTN  ?Stable, monitor outpatient.  ? ?FEN/GI: Renal  ?PPx: Hep sq ?Dispo:SNF  pending placement . Patient is medically clear for discharge.  ? ?Subjective:  ?Patient denies any pain this morning and will try to eat breakfast.  ? ?Objective: ?Temp:  [97.3 ?F (36.3 ?C)-98 ?F (36.7 ?C)] 98 ?F (36.7 ?C) (04/14 0459) ?Pulse Rate:  [53-56] 56 (04/14 0459) ?Resp:  [15-18] 18 (04/14 0459) ?BP: (133-153)/(47-67) 153/57 (04/14 0459) ?SpO2:  [97 %-100 %] 97 % (04/14 0459) ?Physical Exam: ?General: NAD, groggy and nontoxic in appearance  ?Cardiovascular: RRR, no murmur/gallops/rubs  ?Respiratory: CTAB with normal WOB  ?Abdomen: NTTP, nondistended ?Extremities: No lower extremity edema  ? ?Laboratory: ?Recent Labs  ?Lab 12/18/21 ?0243 12/19/21 ?0308 12/20/21 ?0315  ?WBC 3.3* 4.0 3.0*  ?HGB 8.9* 8.9* 8.9*  ?HCT 26.6* 26.5* 26.9*  ?PLT 102* 109* 101*  ? ?Recent Labs  ?Lab 12/16/21 ?1433 12/17/21 ?0451 12/20/21 ?4431 12/20/21 ?1502 12/21/21 ?0238  ?NA 139   < > 137 138 136  ?K 5.0   < > 5.4* 5.0 4.9  ?CL 109   < > 113* 110 107  ?CO2 21*   < > 20* 22 22  ?BUN 68*   < > 73* 74* 81*  ?CREATININE 6.45*   < > 5.24* 5.58* 5.77*  ?CALCIUM 10.3   < > 9.6 9.7 9.8  ?PROT 7.7  --   --   --   --   ?  BILITOT 0.7  --   --   --   --   ?ALKPHOS 54  --   --   --   --   ?ALT 14  --   --   --   --   ?AST 11*  --   --   --   --   ?GLUCOSE 104*   < > 91 112* 103*  ? < > = values in this interval not displayed.  ? ? ? ?Erskine Emery, MD ?12/21/2021, 7:58 AM ?PGY-1, [facility] ?[place] Intern pager: [phone], text pages welcome ? ?

## 2021-12-21 NOTE — Telephone Encounter (Signed)
Pt wife CHRISTINE Barg(SPOUSE) is calling to let Dr. Ancil Boozer know that the patient is in the hospital. And upon discharge, he may be going to a nursing home. Please advise CB-  [phone] ?

## 2021-12-21 NOTE — Progress Notes (Signed)
Physical Therapy Treatment ?Patient Details ?Name: Patrick [name]. ?MRN: [id number] ?DOB: 10-27-1931 ?Today's Date: 12/21/2021 ? ? ?History of Present Illness 88 y.o. male presenting with uncontrolled HTN, previous fall, unstable ambulation. PMHx is significant for HTN, CKD, dementia, glaucoma, dyslipidemia, h/o prostate cancer, osteoporosis, protein calorie malnutrition, ACD. ? ?  ?PT Comments  ? ? Patient progressing towards therapy goals, however continues to require minA for ambulation with rollator. Patient leaning towards L at times during ambulation but patient stating that was normal for him. Unsure of accuracy as no family present. Patient also stated "I just learned I was in the hospital yesterday." Patient seems pleasantly confused. Continue to recommend SNF for ongoing Physical Therapy as wife is unable to physically assist patient at home. He will need to be at supervision level to return home with wife.     ?   ?Recommendations for follow up therapy are one component of a multi-disciplinary discharge planning process, led by the attending physician.  Recommendations may be updated based on patient status, additional functional criteria and insurance authorization. ? ?Follow Up Recommendations ? Skilled nursing-short term rehab (<3 hours/day) ?  ?  ?Assistance Recommended at Discharge Frequent or constant Supervision/Assistance  ?Patient can return home with the following A little help with walking and/or transfers;Help with stairs or ramp for entrance;Assist for transportation ?  ?Equipment Recommendations ? Rollator (4 wheels)  ?  ?Recommendations for Other Services   ? ? ?  ?Precautions / Restrictions Precautions ?Precautions: Fall ?Restrictions ?Weight Bearing Restrictions: No  ?  ? ?Mobility ? Bed Mobility ?Overal bed mobility: Needs Assistance ?Bed Mobility: Supine to Sit, Sit to Supine ?  ?  ?Supine to sit: Supervision ?Sit to supine: Supervision ?  ?General bed mobility comments: supervision  for safety ?  ? ?Transfers ?Overall transfer level: Needs assistance ?Equipment used: Rollator (4 wheels) ?Transfers: Sit to/from Stand ?Sit to Stand: Min guard ?  ?  ?  ?  ?  ?General transfer comment: increased time required to come up to standing from low bed surface. Min guard for safety ?  ? ?Ambulation/Gait ?Ambulation/Gait assistance: Min assist ?Gait Distance (Feet): 220 Feet ?Assistive device: Rollator (4 wheels) ?Gait Pattern/deviations: Step-through pattern, Decreased step length - right, Decreased step length - left, Shuffle, Trunk flexed, Knee flexed in stance - right, Knee flexed in stance - left ?Gait velocity: decreased ?  ?  ?General Gait Details: assist for balance and safety. Patient leaning to L during mobility at times. Cues for close proximity to rollator with poor follow through ? ? ?Stairs ?  ?  ?  ?  ?  ? ? ?Wheelchair Mobility ?  ? ?Modified Rankin (Stroke Patients Only) ?  ? ? ?  ?Balance Overall balance assessment: Needs assistance, History of Falls ?Sitting-balance support: No upper extremity supported ?Sitting balance-Leahy Scale: Good ?  ?  ?Standing balance support: During functional activity, Bilateral upper extremity supported ?Standing balance-Leahy Scale: Poor ?  ?  ?  ?  ?  ?  ?  ?  ?  ?  ?  ?  ?  ? ?  ?Cognition Arousal/Alertness: Awake/alert ?Behavior During Therapy: Flat affect ?Overall Cognitive Status: No family/caregiver present to determine baseline cognitive functioning ?  ?  ?  ?  ?  ?  ?  ?  ?  ?  ?  ?  ?  ?  ?  ?  ?General Comments: patient stating "I just learned I was in the hospital yesterday" ?  ?  ? ?  ?  Exercises   ? ?  ?General Comments   ?  ?  ? ?Pertinent Vitals/Pain Pain Assessment ?Pain Assessment: No/denies pain  ? ? ?Home Living   ?  ?  ?  ?  ?  ?  ?  ?  ?  ?   ?  ?Prior Function    ?  ?  ?   ? ?PT Goals (current goals can now be found in the care plan section) Acute Rehab PT Goals ?Patient Stated Goal: Pt didn't state ?PT Goal Formulation: With  patient ?Time For Goal Achievement: 12/31/21 ?Potential to Achieve Goals: Good ?Progress towards PT goals: Progressing toward goals ? ?  ?Frequency ? ? ? Min 3X/week ? ? ? ?  ?PT Plan Current plan remains appropriate  ? ? ?Co-evaluation   ?  ?  ?  ?  ? ?  ?AM-PAC PT "6 Clicks" Mobility   ?Outcome Measure ? Help needed turning from your back to your side while in a flat bed without using bedrails?: None ?Help needed moving from lying on your back to sitting on the side of a flat bed without using bedrails?: A Little ?Help needed moving to and from a bed to a chair (including a wheelchair)?: A Little ?Help needed standing up from a chair using your arms (e.g., wheelchair or bedside chair)?: A Little ?Help needed to walk in hospital room?: A Little ?Help needed climbing 3-5 steps with a railing? : A Little ?6 Click Score: 19 ? ?  ?End of Session Equipment Utilized During Treatment: Gait belt ?Activity Tolerance: Patient tolerated treatment well ?Patient left: in bed;with call bell/phone within reach;with bed alarm set ?Nurse Communication: Mobility status ?PT Visit Diagnosis: Other abnormalities of gait and mobility (R26.89);Muscle weakness (generalized) (M62.81);History of falling (Z91.81) ?  ? ? ?Time: 7322-0254 ?PT Time Calculation (min) (ACUTE ONLY): 26 min ? ?Charges:  $Gait Training: 23-37 mins          ?          ? ?Charlee Whitebread A. Gilford Rile, PT, DPT ?Acute Rehabilitation Services ?Pager [phone] ?Office [phone] ? ? ? ?Kalsey Lull A Layali Freund ?12/21/2021, 5:19 PM ? ?

## 2021-12-21 NOTE — Progress Notes (Signed)
FPTS Brief Note ?Reviewed patient's vitals, recent notes.  ?Vitals:  ? 12/21/21 1728 12/21/21 2021  ?BP:  (!) 153/86  ?Pulse: (!) 56 60  ?Resp:  16  ?Temp:  97.9 ?F (36.6 ?C)  ?SpO2:  97%  ? ?At this time, no change in plan from day progress note.  ?Juletta Berhe, DO ?Page [phone] with questions about this patient.  ?  ?

## 2021-12-22 DIAGNOSIS — F0390 Unspecified dementia without behavioral disturbance: Secondary | ICD-10-CM | POA: Diagnosis not present

## 2021-12-22 DIAGNOSIS — N179 Acute kidney failure, unspecified: Secondary | ICD-10-CM

## 2021-12-22 DIAGNOSIS — I10 Essential (primary) hypertension: Secondary | ICD-10-CM | POA: Diagnosis not present

## 2021-12-22 LAB — METHYLMALONIC ACID, SERUM: Methylmalonic Acid, Quantitative: 619 nmol/L — ABNORMAL HIGH (ref 0–378)

## 2021-12-22 MED ORDER — ACETAMINOPHEN 325 MG PO TABS
650.0000 mg | ORAL_TABLET | Freq: Four times a day (QID) | ORAL | Status: DC | PRN
  Administered 2021-12-22: 650 mg via ORAL
  Filled 2021-12-22: qty 2

## 2021-12-22 NOTE — Plan of Care (Signed)

## 2021-12-22 NOTE — Plan of Care (Signed)
?  Problem: Education: ?Goal: Knowledge of General Education information will improve ?Description: Including pain rating scale, medication(s)/side effects and non-pharmacologic comfort measures ?Outcome: Progressing ?  ?Problem: Health Behavior/Discharge Planning: ?Goal: Ability to manage health-related needs will improve ?Outcome: Progressing ?  ?Problem: Clinical Measurements: ?Goal: Ability to maintain clinical measurements within normal limits will improve ?Outcome: Progressing ?Goal: Diagnostic test results will improve ?Outcome: Progressing ?Goal: Cardiovascular complication will be avoided ?Outcome: Progressing ?  ?Problem: Nutrition: ?Goal: Adequate nutrition will be maintained ?Outcome: Progressing ?  ?Problem: Coping: ?Goal: Level of anxiety will decrease ?Outcome: Progressing ?  ?Problem: Elimination: ?Goal: Will not experience complications related to bowel motility ?Outcome: Progressing ?Goal: Will not experience complications related to urinary retention ?Outcome: Progressing ?  ?Problem: Pain Managment: ?Goal: General experience of comfort will improve ?Outcome: Progressing ?  ?Problem: Safety: ?Goal: Ability to remain free from injury will improve ?Outcome: Progressing ?  ?Problem: Skin Integrity: ?Goal: Risk for impaired skin integrity will decrease ?Outcome: Progressing ?  ?Problem: Education: ?Goal: Knowledge of disease and its progression will improve ?Outcome: Progressing ?Goal: Individualized Educational Video(s) ?Outcome: Progressing ?  ?Problem: Fluid Volume: ?Goal: Compliance with measures to maintain balanced fluid volume will improve ?Outcome: Progressing ?  ?Problem: Health Behavior/Discharge Planning: ?Goal: Ability to manage health-related needs will improve ?Outcome: Progressing ?  ?Problem: Nutritional: ?Goal: Ability to make healthy dietary choices will improve ?Outcome: Progressing ?  ?Problem: Clinical Measurements: ?Goal: Complications related to the disease process, condition or  treatment will be avoided or minimized ?Outcome: Progressing ?  ?

## 2021-12-22 NOTE — Progress Notes (Signed)
Family Medicine Teaching Service ?Daily Progress Note ?Intern Pager: [phone] ? ?Patient name: Patrick [name]. Medical record number: [id number] ?Date of birth: 02/25/1932 Age: 88 y.o. Gender: male ? ?Primary Care Provider: Steele Sizer, MD ?Consultants: Nephrology, palliative care ?Code Status: DNR ? ?Pt Overview and Major Events to Date:  ?4/9-admitted ? ?Assessment and Plan: ?Deundre P Skippy [place]. is a 88 y.o. male presenting with uncontrolled HTN, previous fall, unstable ambulation, new AKI. PMHx is significant for HTN, CKD, dementia, glaucoma, dyslipidemia, h/o prostate cancer, osteoporosis, protein calorie malnutrition, ACD, pulmonary HTN.  ? ?AKI on CKD stage IV ?BMP today shows potassium elevated at 5.8.  Urine output of 10 cc per chart review.  Patient remains stable for discharge to SNF ?-BMP AM ?-Continue to monitor I's and O's and daily weights ?-Avoid nephrotoxic agents ? ?Hyperkalemia: ?A.m. potassium of 5.8.  Will give 10g Lokelma.  Recheck potassium at 1000. ? ?Pancytopenia ?A.m. labs show hemoglobin 8.0 down from 8.9 yesterday, platelets of 100 similar to yesterday, WBC 5.1. ?-Next CBC 4/18 ? ?History of Alzheimer's disease  protein calorie malnutrition ?Continues working with PT OT as he awaits SNF placement. ?-Continue working with PT/OT and encourage p.o. intake and Ensure supplementation ? ?Hypertension: ?Most recent blood pressures of 138/57. ?-Continue Coreg 3.125 twice daily ?-Continue home amiodarone and hydralazine ? ?Pericardial effusion and pulmonary hypertension ?-Outpatient follow-up ? ?FEN/GI: Renal diet ?PPx: Subcu heparin ?Dispo: SNF, awaiting placement ? ?Subjective:  ?Patient with no complaints this morning. ? ?Objective: ?Temp:  [97.5 ?F (36.4 ?C)-98.2 ?F (36.8 ?C)] 98 ?F (36.7 ?C) (04/15 2011) ?Pulse Rate:  [55-62] 62 (04/15 2011) ?Resp:  [14-15] 14 (04/15 2011) ?BP: (133-176)/(65-73) 147/68 (04/15 2011) ?SpO2:  [96 %-99 %] 99 % (04/15 2011) ?Weight:  [70.4 kg] 70.4 kg  (04/15 2017) ?Physical Exam: ?General: Elderly male lying in bed ?Cardiovascular: RRR, no murmurs ?Respiratory: CTA ?Abdomen: No abdominal discomfort to palpation ? ?Laboratory: ?Recent Labs  ?Lab 12/18/21 ?0243 12/19/21 ?0308 12/20/21 ?0315  ?WBC 3.3* 4.0 3.0*  ?HGB 8.9* 8.9* 8.9*  ?HCT 26.6* 26.5* 26.9*  ?PLT 102* 109* 101*  ? ?Recent Labs  ?Lab 12/16/21 ?1433 12/17/21 ?0451 12/20/21 ?0932 12/20/21 ?1502 12/21/21 ?0238  ?NA 139   < > 137 138 136  ?K 5.0   < > 5.4* 5.0 4.9  ?CL 109   < > 113* 110 107  ?CO2 21*   < > 20* 22 22  ?BUN 68*   < > 73* 74* 81*  ?CREATININE 6.45*   < > 5.24* 5.58* 5.77*  ?CALCIUM 10.3   < > 9.6 9.7 9.8  ?PROT 7.7  --   --   --   --   ?BILITOT 0.7  --   --   --   --   ?ALKPHOS 54  --   --   --   --   ?ALT 14  --   --   --   --   ?AST 11*  --   --   --   --   ?GLUCOSE 104*   < > 91 112* 103*  ? < > = values in this interval not displayed.  ? ? ? ?[name], DO ?12/22/2021, 10:48 PM ?PGY-3, [place] ?[place] Intern pager: [phone], text pages welcome  ?

## 2021-12-23 ENCOUNTER — Other Ambulatory Visit: Payer: Self-pay | Admitting: Family Medicine

## 2021-12-23 DIAGNOSIS — D61818 Other pancytopenia: Secondary | ICD-10-CM | POA: Diagnosis not present

## 2021-12-23 DIAGNOSIS — Z515 Encounter for palliative care: Secondary | ICD-10-CM | POA: Diagnosis not present

## 2021-12-23 DIAGNOSIS — G309 Alzheimer's disease, unspecified: Secondary | ICD-10-CM | POA: Diagnosis not present

## 2021-12-23 DIAGNOSIS — F0390 Unspecified dementia without behavioral disturbance: Secondary | ICD-10-CM | POA: Diagnosis not present

## 2021-12-23 DIAGNOSIS — I120 Hypertensive chronic kidney disease with stage 5 chronic kidney disease or end stage renal disease: Secondary | ICD-10-CM | POA: Diagnosis not present

## 2021-12-23 DIAGNOSIS — Z66 Do not resuscitate: Secondary | ICD-10-CM | POA: Diagnosis not present

## 2021-12-23 DIAGNOSIS — I3139 Other pericardial effusion (noninflammatory): Secondary | ICD-10-CM | POA: Diagnosis not present

## 2021-12-23 DIAGNOSIS — N179 Acute kidney failure, unspecified: Secondary | ICD-10-CM | POA: Diagnosis not present

## 2021-12-23 DIAGNOSIS — E785 Hyperlipidemia, unspecified: Secondary | ICD-10-CM

## 2021-12-23 DIAGNOSIS — I10 Essential (primary) hypertension: Secondary | ICD-10-CM | POA: Diagnosis not present

## 2021-12-23 DIAGNOSIS — I272 Pulmonary hypertension, unspecified: Secondary | ICD-10-CM | POA: Diagnosis not present

## 2021-12-23 DIAGNOSIS — E43 Unspecified severe protein-calorie malnutrition: Secondary | ICD-10-CM | POA: Diagnosis not present

## 2021-12-23 LAB — BASIC METABOLIC PANEL
Anion gap: 10 (ref 5–15)
Anion gap: 8 (ref 5–15)
BUN: 92 mg/dL — ABNORMAL HIGH (ref 8–23)
BUN: 93 mg/dL — ABNORMAL HIGH (ref 8–23)
CO2: 20 mmol/L — ABNORMAL LOW (ref 22–32)
CO2: 21 mmol/L — ABNORMAL LOW (ref 22–32)
Calcium: 9.6 mg/dL (ref 8.9–10.3)
Calcium: 9.9 mg/dL (ref 8.9–10.3)
Chloride: 105 mmol/L (ref 98–111)
Chloride: 106 mmol/L (ref 98–111)
Creatinine, Ser: 5.99 mg/dL — ABNORMAL HIGH (ref 0.61–1.24)
Creatinine, Ser: 5.91 mg/dL — ABNORMAL HIGH (ref 0.61–1.24)
GFR, Estimated: 8 mL/min — ABNORMAL LOW (ref >60)
GFR, Estimated: 9 mL/min — ABNORMAL LOW (ref >60)
Glucose, Bld: 92 mg/dL (ref 70–99)
Glucose, Bld: 134 mg/dL — ABNORMAL HIGH (ref 70–99)
Potassium: 5.8 mmol/L — ABNORMAL HIGH (ref 3.5–5.1)
Potassium: 5.4 mmol/L — ABNORMAL HIGH (ref 3.5–5.1)
Sodium: 135 mmol/L (ref 135–145)
Sodium: 135 mmol/L (ref 135–145)

## 2021-12-23 LAB — CBC
HCT: 23.6 % — ABNORMAL LOW (ref 39.0–52.0)
Hemoglobin: 8 g/dL — ABNORMAL LOW (ref 13.0–17.0)
MCH: 32.1 pg (ref 26.0–34.0)
MCHC: 33.9 g/dL (ref 30.0–36.0)
MCV: 94.8 fL (ref 80.0–100.0)
Platelets: 100 10*3/uL — ABNORMAL LOW (ref 150–400)
RBC: 2.49 MIL/uL — ABNORMAL LOW (ref 4.22–5.81)
RDW: 13.2 % (ref 11.5–15.5)
WBC: 5.1 10*3/uL (ref 4.0–10.5)
nRBC: 0 % (ref 0.0–0.2)

## 2021-12-23 MED ORDER — SODIUM ZIRCONIUM CYCLOSILICATE 10 G PO PACK
10.0000 g | PACK | Freq: Once | ORAL | Status: AC
  Administered 2021-12-23: 10 g via ORAL
  Filled 2021-12-23: qty 1

## 2021-12-23 MED ORDER — ONDANSETRON 4 MG PO TBDP
4.0000 mg | ORAL_TABLET | Freq: Once | ORAL | Status: AC
  Administered 2021-12-23: 4 mg via ORAL
  Filled 2021-12-23: qty 1

## 2021-12-23 NOTE — Progress Notes (Signed)
Interim Progress Note ? ?Received page from RN that patient was feeling nauseous.  Called RN who stated that patient spit up.  Went up to see patient, RN at the bedside as well.  RN reports that patient has not eaten as well today as he did yesterday. He had some green spit up. Patient denies any abdominal pain, chest pain, nausea.  He is sitting in the recliner. ? ?On examination patient appears elderly, frail, weak.  His heart is regular rate and rhythm. No chest pain tenderness. Abdomen soft.  ? ?A/P ?Zofran ODT x1. EKG attempted but there was significant artifact present despite multiple attempts. Doubt cardiac etiology at this time- patient initially presented with vomiting as well. His BUN is elevated from his poor renal function and this could be a cause of the nausea. Last BMP showed improving potassium to 5.4 and another dose of Lokelma was ordered. Next BMP tomorrow morning. He is DNR and is discharging to SNF as early as tomorrow- no utility in continuing cardiac monitoring at this time so will discontinue.  ?

## 2021-12-23 NOTE — Progress Notes (Signed)
FPTS Interim Progress Note ? ?S: Contacted by nursing about an episode of syncope that occurred after the patient was trying to get back in bed while sitting up.  Fell back into bed but then awakened.  Nurse reports that he said he felt nauseous prior to this episode.  He did not hit his head.  Vital signs were stable when entering the room.  It appears that this episode was likely orthostatic in nature.  Patient was alert and responsive in room to what appears to be his baseline during this admission.  ? ?O: ?BP (!) 156/60   Pulse (!) 59   Temp 98.9 ?F (37.2 ?C) (Axillary)   Resp 20   Ht '6\' 2"'$  (1.88 m)   Wt 70.4 kg   SpO2 100%   BMI 19.93 kg/m?   ?Neuro: No new abnormalities on neurological exam  ?Cards: RRR w/o m/g/r ?Resp: CTAB  ?Ext: Good perfusion with palpable DP pulses ? ?A/P: ?Patient had an episode of syncope that appears to be secondary to orthostasis in the setting of dehydration and poor p.o. intake.  Patient has had a recent echo with an EF of 55 to 60% normal left ventricular function, mild concentric left ventricular hypertrophy with grade 1 diastolic dysfunction.  He also had a previous CT of the head that showed no acute intracranial pathology during this admission.  I have low suspicion that this is due to cardiac etiology or new intracranial pathology.  We will obtain updated EKG due to recent electrolyte derangements and heavy artifact on previous EKG from today.  Follow-up on EKG results, will monitor patient throughout the night, will follow-up on BMP in the morning. ? ?Erskine Emery, MD ?12/23/2021, 10:06 PM ?PGY-1, [place] ?Service pager [phone] ? ?

## 2021-12-23 NOTE — Progress Notes (Signed)
Per CCMD, pt had "asystole alarm with artifact". RN in pt room and pt alert. Pt denies chest pain, abdominal pain. Pt began feeling nauseous with green emesis. Dr. Nita Sells to bedside. New order for EKG. EKG obtained.  ?

## 2021-12-23 NOTE — Progress Notes (Signed)
FPTS Brief Note ?Reviewed patient's vitals, recent notes.  ?Vitals:  ? 12/23/21 1451 12/23/21 1555  ?BP: (!) 139/56 (!) 163/60  ?Pulse:  68  ?Resp:  15  ?Temp:  97.7 ?F (36.5 ?C)  ?SpO2:  100%  ? ?Patient with intermittent hyperkalemia-treated with Lokelma intermittently.  He may benefit from discharged with Lokelma doses regularly.  Awaiting insurance authorization for SNF placement.  We will monitor for bradycardic episodes, stable in the 60s at this time. ? ?A.M. CBC and BMP ordered.  ? ?At this time, no change in plan from day progress note.  ?Patrick Emery, MD ?Page [phone] with questions about this patient.  ? ? ?

## 2021-12-23 NOTE — Plan of Care (Signed)

## 2021-12-24 ENCOUNTER — Telehealth: Payer: Self-pay | Admitting: *Deleted

## 2021-12-24 DIAGNOSIS — Y92230 Patient room in hospital as the place of occurrence of the external cause: Secondary | ICD-10-CM | POA: Diagnosis not present

## 2021-12-24 DIAGNOSIS — N2581 Secondary hyperparathyroidism of renal origin: Secondary | ICD-10-CM | POA: Diagnosis present

## 2021-12-24 DIAGNOSIS — E43 Unspecified severe protein-calorie malnutrition: Secondary | ICD-10-CM | POA: Diagnosis present

## 2021-12-24 DIAGNOSIS — F02C0 Dementia in other diseases classified elsewhere, severe, without behavioral disturbance, psychotic disturbance, mood disturbance, and anxiety: Secondary | ICD-10-CM | POA: Diagnosis present

## 2021-12-24 DIAGNOSIS — R296 Repeated falls: Secondary | ICD-10-CM | POA: Diagnosis present

## 2021-12-24 DIAGNOSIS — Y92009 Unspecified place in unspecified non-institutional (private) residence as the place of occurrence of the external cause: Secondary | ICD-10-CM | POA: Diagnosis not present

## 2021-12-24 DIAGNOSIS — N189 Chronic kidney disease, unspecified: Secondary | ICD-10-CM | POA: Diagnosis present

## 2021-12-24 DIAGNOSIS — E875 Hyperkalemia: Secondary | ICD-10-CM | POA: Diagnosis not present

## 2021-12-24 DIAGNOSIS — Z79899 Other long term (current) drug therapy: Secondary | ICD-10-CM | POA: Diagnosis not present

## 2021-12-24 DIAGNOSIS — D649 Anemia, unspecified: Secondary | ICD-10-CM | POA: Diagnosis not present

## 2021-12-24 DIAGNOSIS — G2581 Restless legs syndrome: Secondary | ICD-10-CM | POA: Diagnosis present

## 2021-12-24 DIAGNOSIS — N3289 Other specified disorders of bladder: Secondary | ICD-10-CM | POA: Diagnosis not present

## 2021-12-24 DIAGNOSIS — Z789 Other specified health status: Secondary | ICD-10-CM | POA: Diagnosis not present

## 2021-12-24 DIAGNOSIS — I10 Essential (primary) hypertension: Secondary | ICD-10-CM | POA: Diagnosis not present

## 2021-12-24 DIAGNOSIS — I700 Atherosclerosis of aorta: Secondary | ICD-10-CM | POA: Diagnosis present

## 2021-12-24 DIAGNOSIS — D638 Anemia in other chronic diseases classified elsewhere: Secondary | ICD-10-CM | POA: Diagnosis present

## 2021-12-24 DIAGNOSIS — F0390 Unspecified dementia without behavioral disturbance: Secondary | ICD-10-CM | POA: Diagnosis not present

## 2021-12-24 DIAGNOSIS — N185 Chronic kidney disease, stage 5: Secondary | ICD-10-CM | POA: Diagnosis present

## 2021-12-24 DIAGNOSIS — I3139 Other pericardial effusion (noninflammatory): Secondary | ICD-10-CM | POA: Diagnosis present

## 2021-12-24 DIAGNOSIS — W2209XA Striking against other stationary object, initial encounter: Secondary | ICD-10-CM | POA: Diagnosis present

## 2021-12-24 DIAGNOSIS — Z515 Encounter for palliative care: Secondary | ICD-10-CM | POA: Diagnosis not present

## 2021-12-24 DIAGNOSIS — Z681 Body mass index (BMI) 19 or less, adult: Secondary | ICD-10-CM | POA: Diagnosis not present

## 2021-12-24 DIAGNOSIS — G309 Alzheimer's disease, unspecified: Secondary | ICD-10-CM | POA: Diagnosis not present

## 2021-12-24 DIAGNOSIS — K661 Hemoperitoneum: Secondary | ICD-10-CM | POA: Diagnosis not present

## 2021-12-24 DIAGNOSIS — W06XXXA Fall from bed, initial encounter: Secondary | ICD-10-CM | POA: Diagnosis not present

## 2021-12-24 DIAGNOSIS — R58 Hemorrhage, not elsewhere classified: Secondary | ICD-10-CM | POA: Diagnosis not present

## 2021-12-24 DIAGNOSIS — E7800 Pure hypercholesterolemia, unspecified: Secondary | ICD-10-CM | POA: Diagnosis present

## 2021-12-24 DIAGNOSIS — Z7189 Other specified counseling: Secondary | ICD-10-CM | POA: Diagnosis not present

## 2021-12-24 DIAGNOSIS — N179 Acute kidney failure, unspecified: Secondary | ICD-10-CM | POA: Diagnosis not present

## 2021-12-24 DIAGNOSIS — Z91148 Patient's other noncompliance with medication regimen for other reason: Secondary | ICD-10-CM | POA: Diagnosis not present

## 2021-12-24 DIAGNOSIS — I120 Hypertensive chronic kidney disease with stage 5 chronic kidney disease or end stage renal disease: Secondary | ICD-10-CM | POA: Diagnosis present

## 2021-12-24 DIAGNOSIS — I272 Pulmonary hypertension, unspecified: Secondary | ICD-10-CM | POA: Diagnosis not present

## 2021-12-24 DIAGNOSIS — Z8546 Personal history of malignant neoplasm of prostate: Secondary | ICD-10-CM | POA: Diagnosis not present

## 2021-12-24 DIAGNOSIS — M810 Age-related osteoporosis without current pathological fracture: Secondary | ICD-10-CM | POA: Diagnosis present

## 2021-12-24 DIAGNOSIS — D61818 Other pancytopenia: Secondary | ICD-10-CM | POA: Diagnosis not present

## 2021-12-24 DIAGNOSIS — Z66 Do not resuscitate: Secondary | ICD-10-CM | POA: Diagnosis present

## 2021-12-24 DIAGNOSIS — I161 Hypertensive emergency: Secondary | ICD-10-CM | POA: Diagnosis not present

## 2021-12-24 DIAGNOSIS — R627 Adult failure to thrive: Secondary | ICD-10-CM | POA: Diagnosis not present

## 2021-12-24 LAB — BASIC METABOLIC PANEL
Anion gap: 9 (ref 5–15)
Anion gap: 12 (ref 5–15)
BUN: 99 mg/dL — ABNORMAL HIGH (ref 8–23)
BUN: 108 mg/dL — ABNORMAL HIGH (ref 8–23)
CO2: 21 mmol/L — ABNORMAL LOW (ref 22–32)
CO2: 20 mmol/L — ABNORMAL LOW (ref 22–32)
Calcium: 10 mg/dL (ref 8.9–10.3)
Calcium: 9.5 mg/dL (ref 8.9–10.3)
Chloride: 106 mmol/L (ref 98–111)
Chloride: 103 mmol/L (ref 98–111)
Creatinine, Ser: 6.63 mg/dL — ABNORMAL HIGH (ref 0.61–1.24)
Creatinine, Ser: 7.03 mg/dL — ABNORMAL HIGH (ref 0.61–1.24)
GFR, Estimated: 7 mL/min — ABNORMAL LOW (ref >60)
GFR, Estimated: 7 mL/min — ABNORMAL LOW (ref >60)
Glucose, Bld: 104 mg/dL — ABNORMAL HIGH (ref 70–99)
Glucose, Bld: 122 mg/dL — ABNORMAL HIGH (ref 70–99)
Potassium: 6 mmol/L — ABNORMAL HIGH (ref 3.5–5.1)
Potassium: 6.2 mmol/L — ABNORMAL HIGH (ref 3.5–5.1)
Sodium: 136 mmol/L (ref 135–145)
Sodium: 135 mmol/L (ref 135–145)

## 2021-12-24 LAB — CBC
HCT: 21.5 % — ABNORMAL LOW (ref 39.0–52.0)
HCT: 17.9 % — ABNORMAL LOW (ref 39.0–52.0)
Hemoglobin: 7.2 g/dL — ABNORMAL LOW (ref 13.0–17.0)
Hemoglobin: 6 g/dL — CL (ref 13.0–17.0)
MCH: 31.6 pg (ref 26.0–34.0)
MCH: 31.6 pg (ref 26.0–34.0)
MCHC: 33.5 g/dL (ref 30.0–36.0)
MCHC: 33.5 g/dL (ref 30.0–36.0)
MCV: 94.3 fL (ref 80.0–100.0)
MCV: 94.2 fL (ref 80.0–100.0)
Platelets: 61 10*3/uL — ABNORMAL LOW (ref 150–400)
Platelets: 100 10*3/uL — ABNORMAL LOW (ref 150–400)
RBC: 2.28 MIL/uL — ABNORMAL LOW (ref 4.22–5.81)
RBC: 1.9 MIL/uL — ABNORMAL LOW (ref 4.22–5.81)
RDW: 13.2 % (ref 11.5–15.5)
RDW: 12.9 % (ref 11.5–15.5)
WBC: 5.7 10*3/uL (ref 4.0–10.5)
WBC: 5.3 10*3/uL (ref 4.0–10.5)
nRBC: 0 % (ref 0.0–0.2)
nRBC: 0 % (ref 0.0–0.2)

## 2021-12-24 LAB — HEMOGLOBIN AND HEMATOCRIT, BLOOD
HCT: 19.1 % — ABNORMAL LOW (ref 39.0–52.0)
Hemoglobin: 6.3 g/dL — CL (ref 13.0–17.0)

## 2021-12-24 LAB — PREPARE RBC (CROSSMATCH)

## 2021-12-24 LAB — ABO/RH: ABO/RH(D): B NEG

## 2021-12-24 MED ORDER — POLYETHYLENE GLYCOL 3350 17 G PO PACK
17.0000 g | PACK | Freq: Two times a day (BID) | ORAL | Status: DC
  Administered 2021-12-24 – 2021-12-25 (×3): 17 g via ORAL
  Filled 2021-12-24 (×3): qty 1

## 2021-12-24 MED ORDER — SODIUM ZIRCONIUM CYCLOSILICATE 10 G PO PACK
10.0000 g | PACK | ORAL | Status: AC
  Administered 2021-12-24 – 2021-12-25 (×2): 10 g via ORAL
  Filled 2021-12-24 (×2): qty 1

## 2021-12-24 MED ORDER — SODIUM CHLORIDE 0.9% IV SOLUTION: Freq: Once | INTRAVENOUS | Status: AC

## 2021-12-24 MED ORDER — SODIUM ZIRCONIUM CYCLOSILICATE 10 G PO PACK
10.0000 g | PACK | ORAL | Status: AC
  Administered 2021-12-24 (×2): 10 g via ORAL
  Filled 2021-12-24 (×2): qty 1

## 2021-12-24 MED ORDER — SENNA 8.6 MG PO TABS
1.0000 | ORAL_TABLET | Freq: Two times a day (BID) | ORAL | Status: DC
  Administered 2021-12-24 (×2): 8.6 mg via ORAL
  Filled 2021-12-24 (×2): qty 1

## 2021-12-24 NOTE — Progress Notes (Signed)
FPTS Brief Progress Note ? ?S: Went to bedside to assess patient. He denies any pain at present, has ice pack on his right arm.  ? ? ?O: ?BP (!) 125/50 (BP Location: Right Arm)   Pulse (!) 55   Temp 97.8 ?F (36.6 ?C) (Oral)   Resp 18   Ht '6\' 2"'$  (1.88 m)   Wt 70.4 kg   SpO2 97%   BMI 19.93 kg/m?   ?Gen: NAD, nontoxic in appearance  ?Resp: Normal WOB  ?Ext: RUE, RFA edema and ecchymosis over IV site, no warmth, NTTP, able to move right hand ? ?A/P: ?Patient has received 1uprbcs, had infiltration of PIV during transfusion. Post H/H hg of 6> repeat H/H 6.2. Decided to move forward with another unit of blood. Will have received a total of 2U during this hospitalization. Repeat H/H post 2nd transfusion pending. New PIV in place and functioning properly. No concern for DVT at this time, will hold off on U/S.  ? ?Hyperkalemia in setting of CKD and worsening creatinine. Lokelma x2 given over the course of the day on 4/17. Updated K was 6.2>reordered Lokelma x2 doses throughout the night. Updated BMP ordered for AM. Patient is a poor candidate for dialysis, would likely benefit from [facility] to go to snf with.  ? ?EKG ordered again for hyperkalemia, sinus bradycardia.  ? ?Erskine Emery, MD ?12/24/2021, 10:09 PM ?PGY-1, [place] [facility] Night Resident  ?Please page [phone] with questions.  ? ? ?

## 2021-12-24 NOTE — Telephone Encounter (Signed)
Received a call from patient's wife Altha Harm to let palliative care know that patient is currently at [facility] and it looks like patient will be discharged to a SNF. She says he is not doing well. Communication sent to our hospital liaison team to follow. According to notes, it appears that [facility] is currently awaiting insurance authorization.  ?

## 2021-12-24 NOTE — TOC Progression Note (Addendum)
Transition of Care (TOC) - Initial/Assessment Note  ? ? ?Patient Details  ?Name: Patrick [name]. ?MRN: [id number] ?Date of Birth: [birth year] ? ?Transition of Care (TOC) CM/SW Contact:    ?Paulene Floor Bethel Gaglio, LCSWA ?Phone Number: ?12/24/2021, 10:41 AM ? ?Clinical Narrative:                 ?CSW received notification from [place] with admissions at [facility] that insurance Josem Kaufmann is still pending.   ? ?16:45-  CSW notified that insurance Josem Kaufmann has been received and the facility can accept the patient tomorrow.  Attending notified.  ? ?Expected Discharge Plan: [place] ?Barriers to Discharge: Ship broker, SNF Pending bed offer ? ? ?Patient Goals and CMS Choice ?  ?CMS Medicare.gov Compare Post Acute Care list provided to:: Patient Represenative (must comment) ?Choice offered to / list presented to : Spouse ? ?Expected Discharge Plan and Services ?Expected Discharge Plan: [place] ?  ?  ?  ?Living arrangements for the past 2 months: Apartment ?                ?  ?  ?  ?  ?  ?  ?  ?  ?  ?  ? ?Prior Living Arrangements/Services ?Living arrangements for the past 2 months: Apartment ?Lives with:: Spouse ?Patient language and need for interpreter reviewed:: Yes ?       ?  ?Care giver support system in place?: Yes (comment) ?  ?Criminal Activity/Legal Involvement Pertinent to Current Situation/Hospitalization: No - Comment as needed ? ?Activities of Daily Living ?Home Assistive Devices/Equipment: None ?ADL Screening (condition at time of admission) ?Patient's cognitive ability adequate to safely complete daily activities?: No ?Is the patient deaf or have difficulty hearing?: No ?Does the patient have difficulty seeing, even when wearing glasses/contacts?: No ?Does the patient have difficulty concentrating, remembering, or making decisions?: Yes ?Patient able to express need for assistance with ADLs?: Yes ?Does the patient have difficulty dressing or bathing?: Yes ?Independently performs  ADLs?: No ?Communication: Independent ?Dressing (OT): Needs assistance ?Is this a change from baseline?: Pre-admission baseline ?Feeding: Needs assistance ?Is this a change from baseline?: Pre-admission baseline ?Toileting: Needs assistance ?Does the patient have difficulty walking or climbing stairs?: Yes ?Weakness of Legs: Both ?Weakness of Arms/Hands: Both ? ?Permission Sought/Granted ?  ?Permission granted to share information with : Yes, Verbal Permission Granted ?   ? Permission granted to share info w AGENCY: SNF per spouse ?   ?   ? ?Emotional Assessment ?  ?  ?  ?Orientation: : Oriented to Self ?Alcohol / Substance Use: Not Applicable ?Psych Involvement: No (comment) ? ?Admission diagnosis:  HTN (hypertension) [I10] ?Hypertensive emergency [I16.1] ?Acute renal failure superimposed on stage 5 chronic kidney disease, not on chronic dialysis, unspecified acute renal failure type ([place]) [N17.9, N18.5] ?Patient Active Problem List  ? Diagnosis Date Noted  ? Acute renal failure superimposed on stage 5 chronic kidney disease, not on chronic dialysis ([place])   ? Protein-calorie malnutrition, severe 12/18/2021  ? Acute kidney insufficiency 12/18/2021  ? HTN (hypertension) 12/16/2021  ? Unstable angina pectoris ([place]) 07/14/2020  ? Mild protein-calorie malnutrition ([place]) 07/14/2020  ? Mild major depression ([place]) 01/10/2020  ? CKD (chronic kidney disease) stage 3, GFR 30-59 ml/min (HCC) 04/05/2019  ? Metabolic acidosis, normal anion gap (NAG) 07/27/2018  ? Dementia without behavioral disturbance ([place]) 07/27/2018  ? Glaucoma 07/27/2018  ? Secondary hyperparathyroidism ([place]) 11/26/2016  ? Elevated prostate specific antigen (PSA) 09/08/2015  ? Bone  pain 06/13/2015  ? Spinal stenosis 06/13/2015  ? OP (osteoporosis) 03/20/2015  ? Allergic rhinitis 03/20/2015  ? Lumbar canal stenosis 03/20/2015  ? Vitamin D deficiency 03/20/2015  ? Restless legs syndrome 03/20/2015  ? Drug-induced gynecomastia 03/20/2015  ? Diaphragmatic hernia  03/20/2015  ? Acquired cyst of kidney 03/20/2015  ? Left ventricular hypertrophy by electrocardiogram 03/20/2015  ? Leg pain 03/20/2015  ? History of pneumonia 03/20/2015  ? Non-rheumatic tricuspid valve insufficiency 03/20/2015  ? Dysmetabolic syndrome 70/35/0093  ? Pulmonary hypertension ([place]) 10/31/2014  ? Benign essential HTN 07/29/2014  ? Dyslipidemia 07/29/2014  ? PVD (peripheral vascular disease) ([place])   ? Anemia of chronic disease   ? IBS (irritable bowel syndrome)   ? Bradycardia   ? ED (erectile dysfunction) of organic origin 06/29/2012  ? Genuine stress incontinence, male 06/29/2012  ? Malignant neoplasm of prostate ([place]) 06/29/2012  ? ?PCP:  Steele Sizer, MD ?Pharmacy:   ?[facility] [place] ?[place] ?[place] [address] ?Phone: [phone] Fax: [phone] ? ?[facility] - [place], [place] - [address] ?[address] ?[place] [address] ?Phone: [phone] Fax: [phone] ? ? ? ? ?Social Determinants of Health (SDOH) Interventions ?  ? ?Readmission Risk Interventions ?   ? View : No data to display.  ?  ?  ?  ? ? ? ?

## 2021-12-24 NOTE — Progress Notes (Signed)
Family Medicine Teaching Service ?Daily Progress Note ?Intern Pager: [phone] ? ?Patient name: Ewel Lona. Medical record number: [id number] ?Date of birth: 05/18/1932 Age: 88 y.o. Gender: male ? ?Primary Care Provider: Steele Sizer, MD ?Consultants: Nephrology, palliative care ?Code Status: DNR ? ?Pt Overview and Major Events to Date:  ?4/9- admitted ? ?Assessment and Plan: ?Ervie P Orien Mayhall. is a 88 y.o. male presenting with uncontrolled HTN, previous fall, unstable ambulation, new AKI. PMHx is significant for HTN, CKD, dementia, glaucoma, dyslipidemia, h/o prostate cancer, osteoporosis, protein calorie malnutrition, ACD, pulmonary HTN.  ? ?Syncope  Orthostasis ?Patient had an event last night when he appeared to syncopize and fell into bed.  Patient's vitals were stable at the time.  ECG was obtained without new ischemic changes.  Suspect that this was all secondary to orthostasis as patient had positive orthostatic vital signs earlier in the admission.  Patient had an echo with LVEF 55-60%, mild concentric LVH, grade 1 diastolic dysfunction.  Also had a CT head earlier in the admission which did not show any acute intracranial pathology. ?-Will repeat orthostatics ?-Offer patient water every 4 hours ?-Assist with meals ? ?AKI on CKD IV  Hyperkalemia ?BMP this am with K 6.0 also with a bump in Cr from 5.91>6.63. UOP 442m overnight. Suspect this is secondary to poor fluid intake, which is likely exacerbating orthostatic symptoms as discussed above.  ?- Lokelma 10g x2, recheck BMP at 1600 ?- Offer fluids as above ?- Continue I/O and daily weights ?- Avoid nephrotoxic agents ?- May need to re-engage nephrology ? ?Pancytopenia ?Hgb 8.0>7.2. Plt 100>61. Iron studies one month ago with ferritin 128, TIBC 223, %Sat 34. Had a peripheral smear that was unremarkable.  ?- Will transfuse 1u PRBC, postransfusion CBC ? ?Alzheimer's disease  Protein Calorie Malnutrition ?Working with PT/OT awaiting SNF ?-  Continue PT/OT ? ?HTN ?BP 153/67 ?- Continue Coreg 3.125 BID ?- Continue home amiodarone and hydralazine ? ?Pericardial effusion  Pulmonary HTN ?- Will need outpt follow-up  ? ?Constipation ?No BM since the 11th.  ?- MiraLAX BID ?- Senna ? ?FEN/GI: Renal, no fluid restriction ?PPx: Heparin SQ ?Dispo:SNF tomorrow. Barriers include transfusion, determining home lokelma needs.  ? ?Subjective:  ?Mr. SWeinbergdenies any complaints today. He does not recall the syncopal event last night. Feels generally well. Has not had a bowel movement since the 11th. ? ?Objective: ?Temp:  [97.7 ?F (36.5 ?C)-98.9 ?F (37.2 ?C)] 98.5 ?F (36.9 ?C) (04/17 0453) ?Pulse Rate:  [57-70] 62 (04/17 0453) ?Resp:  [15-22] 18 (04/17 0453) ?BP: (107-182)/(48-91) 153/67 (04/17 0453) ?SpO2:  [97 %-100 %] 99 % (04/17 0453) ?Physical Exam: ?General: Elderly male, NAD ?Cardiovascular: RRR, 2/6 systolic murmur ?Respiratory: Normal work of breathing on room air ?Abdomen: Without abdominal distention or tenderness ?Extremities: 1+ pedal edema bilaterally ? ?Laboratory: ?Recent Labs  ?Lab 12/20/21 ?0010904/16/23 ?0323504/17/23 ?0504  ?WBC 3.0* 5.1 5.7  ?HGB 8.9* 8.0* 7.2*  ?HCT 26.9* 23.6* 21.5*  ?PLT 101* 100* 61*  ? ?Recent Labs  ?Lab 12/23/21 ?0455 12/23/21 ?1012 12/24/21 ?05732 ?NA 135 135 136  ?K 5.8* 5.4* 6.0*  ?CL 105 106 106  ?CO2 20* 21* 21*  ?BUN 92* 93* 99*  ?CREATININE 5.99* 5.91* 6.63*  ?CALCIUM 9.6 9.9 10.0  ?GLUCOSE 92 134* 104*  ? ? ? ?Imaging/Diagnostic Tests: ?No new imaging, tests ? ? ?SEppie Gibson MD ?12/24/2021, 8:10 AM ?PGY-1, [facility] ?[facility] pager: [phone] text pages welcome ? ?

## 2021-12-24 NOTE — Progress Notes (Signed)
Pt's PIV RFA infiltrated at the end of blood transfusion.  RFA edematous.  Dr. Hughes Better made aware.  RFA elevated on pillows x 2, ice pack applied.  Pt verbalized good sensation to right hand, warm to touch.  Pt said site with some burning sensation but denies pain.  Will monitor. ?

## 2021-12-24 NOTE — Progress Notes (Signed)
[facility] Hospital Liaison note:  This patient is currently enrolled in [facility] outpatient-based Palliative Care. Will continue to follow for disposition.  Please call with any outpatient palliative questions or concerns.  Thank you, Dee Curry, LPN [facility] Hospital Liaison [phone] 

## 2021-12-24 NOTE — Progress Notes (Signed)
Physical Therapy Treatment ?Patient Details ?Name: Patrick [name]. ?MRN: [id number] ?DOB: 07-24-1932 ?Today's Date: 12/24/2021 ? ? ?History of Present Illness 88 y.o. male presenting with uncontrolled HTN, previous fall, unstable ambulation. PMHx is significant for HTN, CKD, dementia, glaucoma, dyslipidemia, h/o prostate cancer, osteoporosis, protein calorie malnutrition, ACD. ? ?  ?PT Comments  ? ? Continuing work on functional mobility and activity tolerance;  Session focused on taking a closer look at orthostatic vitals in light of syncopal episode overnight:  ? ? ? 12/24/21 0830  ?Orthostatic Lying   ?BP- Lying 144/68 ?(MAP 89)  ?Pulse- Lying 61  ?Orthostatic Sitting  ?BP- Sitting 125/57 ?(MAP 79)  ?Pulse- Sitting 62  ?Orthostatic Standing at 0 minutes  ?BP- Standing at 0 minutes 117/66 ?(MAP 79)  ?Pulse- Standing at 0 minutes 64  ?Orthostatic Standing at 3 minutes  ?BP- Standing at 3 minutes 113/50 ?(MAP 64)  ?Pulse- Standing at 3 minutes 65  ? ?Then, back in bed: ? ? ? 12/24/21 0835  ?Orthostatic Lying   ?BP- Lying 149/56 ?(MAP 85)  ?Pulse- Lying 58 ?(HOB elevated)  ? ?Demonstrated a 33 mmHg drop in SBP from supine to standing 3 minutes -- pt did not report nausea or dizziness, but did note that he tended to lean back and brace himself against the bed;  ? ?Worth considering TED hose, and see if they help with upright activity tolerance; Continue to recommend SNF for rehab to maximize independence and safety with mobility and ADLs   ?Recommendations for follow up therapy are one component of a multi-disciplinary discharge planning process, led by the attending physician.  Recommendations may be updated based on patient status, additional functional criteria and insurance authorization. ? ?Follow Up Recommendations ? Skilled nursing-short term rehab (<3 hours/day) ?  ?  ?Assistance Recommended at Discharge Frequent or constant Supervision/Assistance  ?Patient can return home with the following A little help  with walking and/or transfers;Help with stairs or ramp for entrance;Assist for transportation ?  ?Equipment Recommendations ? Rollator (4 wheels)  ?  ?Recommendations for Other Services   ? ? ?  ?Precautions / Restrictions Precautions ?Precautions: Fall ?Precaution Comments: SBP drop from supine to standing  ?  ? ?Mobility ? Bed Mobility ?Overal bed mobility: Needs Assistance ?Bed Mobility: Supine to Sit, Sit to Supine ?  ?  ?Supine to sit: Min assist ?Sit to supine: Supervision ?  ?General bed mobility comments: Min handheld assist to pull to sit; Supervision to get back in bed, cues to straighten out ?  ? ?Transfers ?Overall transfer level: Needs assistance ?Equipment used: Rolling walker (2 wheels) ?Transfers: Sit to/from Stand, Bed to chair/wheelchair/BSC ?Sit to Stand: Min guard ?  ?Step pivot transfers: Min assist ?  ?  ?  ?General transfer comment: increased time required to come up to standing from low bed surface. Min guard to steady; Tending to brace backs of LEs against bed, and had persistent posterior lean; Min assist to move RW and steady; posterior lean, bracing backs of LEs against the bed ?  ? ?Ambulation/Gait ?  ?  ?  ?  ?  ?  ?  ?General Gait Details: deferred -- due to SBP drop from supine to standing, and heavy posterior lean ? ? ?Stairs ?  ?  ?  ?  ?  ? ? ?Wheelchair Mobility ?  ? ?Modified Rankin (Stroke Patients Only) ?  ? ? ?  ?Balance   ?  ?Sitting balance-Leahy Scale: Fair ?  ?  ?  ?  Standing balance-Leahy Scale: Poor ?  ?  ?  ?  ?  ?  ?  ?  ?  ?  ?  ?  ?  ? ?  ?Cognition Arousal/Alertness: Awake/alert ?Behavior During Therapy: Flat affect ?Overall Cognitive Status: No family/caregiver present to determine baseline cognitive functioning ?  ?  ?  ?  ?  ?  ?  ?  ?  ?  ?  ?  ?  ?  ?  ?  ?General Comments: Oriented to self, pleasant, follows commands; Noting he will repeat "Free at last, thank God Almighty, I'm free at last" from Dr. Gwenlyn Perking Jr's speech ?  ?  ? ?  ?Exercises   ? ?  ?General  Comments General comments (skin integrity, edema, etc.): See vitals flow sheet.  ?  ?  ? ?Pertinent Vitals/Pain Pain Assessment ?Pain Assessment: No/denies pain  ? ? ?Home Living   ?  ?  ?  ?  ?  ?  ?  ?  ?  ?   ?  ?Prior Function    ?  ?  ?   ? ?PT Goals (current goals can now be found in the care plan section) Acute Rehab PT Goals ?Patient Stated Goal: Reports he wants to relax ?PT Goal Formulation: With patient ?Time For Goal Achievement: 12/31/21 ?Potential to Achieve Goals: Good ?Progress towards PT goals: Not progressing toward goals - comment (heavy Posterior lean, and SBP drop in standing) ? ?  ?Frequency ? ? ? Min 3X/week ? ? ? ?  ?PT Plan Current plan remains appropriate  ? ? ?Co-evaluation   ?  ?  ?  ?  ? ?  ?AM-PAC PT "6 Clicks" Mobility   ?Outcome Measure ? Help needed turning from your back to your side while in a flat bed without using bedrails?: None ?Help needed moving from lying on your back to sitting on the side of a flat bed without using bedrails?: A Little ?Help needed moving to and from a bed to a chair (including a wheelchair)?: A Little ?Help needed standing up from a chair using your arms (e.g., wheelchair or bedside chair)?: A Little ?Help needed to walk in hospital room?: A Lot ?Help needed climbing 3-5 steps with a railing? : Total ?6 Click Score: 16 ? ?  ?End of Session   ?Activity Tolerance: Patient tolerated treatment well;Other (comment) (Though noted orthostatic BP drop from supine to standing) ?Patient left: in bed;with call bell/phone within reach;with bed alarm set (bed in semi-chair position) ?Nurse Communication: Mobility status ?PT Visit Diagnosis: Other abnormalities of gait and mobility (R26.89);Muscle weakness (generalized) (M62.81);History of falling (Z91.81) ?  ? ? ?Time: 6578-4696 ?PT Time Calculation (min) (ACUTE ONLY): 27 min ? ?Charges:  $Therapeutic Activity: 23-37 mins          ?          ? ?Roney Marion, PT  ?Acute Rehabilitation Services ?Office  [phone] ? ? ? ?Colletta [place] ?12/24/2021, 9:37 AM ? ?

## 2021-12-24 NOTE — Progress Notes (Signed)
Assisted patient back to bed from chair while he was sitting at the side of the bed pt. verbalized he is nauseous, passed out for few seconds and awaken, alert. Put on [place] on 3 liter, VS taken, stable. Notified resident and made them aware. While in the room Resident Zigmund Daniel came to the room to assessed pt. Will await orders.  ?

## 2021-12-25 ENCOUNTER — Inpatient Hospital Stay (HOSPITAL_COMMUNITY): Payer: Medicare HMO

## 2021-12-25 DIAGNOSIS — E43 Unspecified severe protein-calorie malnutrition: Secondary | ICD-10-CM | POA: Diagnosis not present

## 2021-12-25 DIAGNOSIS — R627 Adult failure to thrive: Secondary | ICD-10-CM

## 2021-12-25 DIAGNOSIS — L8990 Pressure ulcer of unspecified site, unspecified stage: Secondary | ICD-10-CM | POA: Insufficient documentation

## 2021-12-25 DIAGNOSIS — N179 Acute kidney failure, unspecified: Secondary | ICD-10-CM | POA: Diagnosis not present

## 2021-12-25 DIAGNOSIS — I10 Essential (primary) hypertension: Secondary | ICD-10-CM | POA: Diagnosis not present

## 2021-12-25 DIAGNOSIS — D649 Anemia, unspecified: Secondary | ICD-10-CM

## 2021-12-25 DIAGNOSIS — I272 Pulmonary hypertension, unspecified: Secondary | ICD-10-CM

## 2021-12-25 DIAGNOSIS — F0390 Unspecified dementia without behavioral disturbance: Secondary | ICD-10-CM | POA: Diagnosis not present

## 2021-12-25 LAB — BASIC METABOLIC PANEL
Anion gap: 9 (ref 5–15)
BUN: 115 mg/dL — ABNORMAL HIGH (ref 8–23)
CO2: 22 mmol/L (ref 22–32)
Calcium: 9.1 mg/dL (ref 8.9–10.3)
Chloride: 105 mmol/L (ref 98–111)
Creatinine, Ser: 6.98 mg/dL — ABNORMAL HIGH (ref 0.61–1.24)
GFR, Estimated: 7 mL/min — ABNORMAL LOW (ref >60)
Glucose, Bld: 101 mg/dL — ABNORMAL HIGH (ref 70–99)
Potassium: 5.1 mmol/L (ref 3.5–5.1)
Sodium: 136 mmol/L (ref 135–145)

## 2021-12-25 LAB — CBC
HCT: 19.5 % — ABNORMAL LOW (ref 39.0–52.0)
Hemoglobin: 6.5 g/dL — CL (ref 13.0–17.0)
MCH: 30.8 pg (ref 26.0–34.0)
MCHC: 33.3 g/dL (ref 30.0–36.0)
MCV: 92.4 fL (ref 80.0–100.0)
Platelets: 95 10*3/uL — ABNORMAL LOW (ref 150–400)
RBC: 2.11 MIL/uL — ABNORMAL LOW (ref 4.22–5.81)
RDW: 14.9 % (ref 11.5–15.5)
WBC: 5 10*3/uL (ref 4.0–10.5)
nRBC: 0 % (ref 0.0–0.2)

## 2021-12-25 LAB — OCCULT BLOOD X 1 CARD TO LAB, STOOL: Fecal Occult Bld: NEGATIVE

## 2021-12-25 LAB — LACTATE DEHYDROGENASE: LDH: 131 U/L (ref 98–192)

## 2021-12-25 LAB — BILIRUBIN, FRACTIONATED(TOT/DIR/INDIR)
Bilirubin, Direct: <0.1 mg/dL (ref 0.0–0.2)
Total Bilirubin: 0.6 mg/dL (ref 0.3–1.2)

## 2021-12-25 LAB — PREPARE RBC (CROSSMATCH)

## 2021-12-25 LAB — SAVE SMEAR(SSMR), FOR PROVIDER SLIDE REVIEW

## 2021-12-25 MED ORDER — HALOPERIDOL 0.5 MG PO TABS
0.5000 mg | ORAL_TABLET | ORAL | Status: DC | PRN
  Filled 2021-12-25: qty 1

## 2021-12-25 MED ORDER — PANTOPRAZOLE SODIUM 40 MG IV SOLR
40.0000 mg | Freq: Two times a day (BID) | INTRAVENOUS | Status: DC
  Administered 2021-12-25 (×2): 40 mg via INTRAVENOUS
  Filled 2021-12-25 (×2): qty 10

## 2021-12-25 MED ORDER — RENA-VITE PO TABS: 1.0000 | ORAL_TABLET | Freq: Every day | ORAL | Status: DC

## 2021-12-25 MED ORDER — HALOPERIDOL LACTATE 5 MG/ML IJ SOLN: 0.5000 mg | INTRAMUSCULAR | Status: DC | PRN

## 2021-12-25 MED ORDER — FENTANYL CITRATE PF 50 MCG/ML IJ SOSY: 25.0000 ug | PREFILLED_SYRINGE | INTRAMUSCULAR | Status: DC | PRN

## 2021-12-25 MED ORDER — BISACODYL 10 MG RE SUPP
10.0000 mg | Freq: Once | RECTAL | Status: AC
  Administered 2021-12-25: 10 mg via RECTAL
  Filled 2021-12-25: qty 1

## 2021-12-25 MED ORDER — LORAZEPAM 2 MG/ML PO CONC
1.0000 mg | ORAL | Status: DC | PRN
  Administered 2021-12-31: 1 mg via SUBLINGUAL
  Filled 2021-12-25: qty 1

## 2021-12-25 MED ORDER — NEPRO/CARBSTEADY PO LIQD
237.0000 mL | Freq: Two times a day (BID) | ORAL | Status: DC
Start: 2021-12-25 — End: 2021-12-25

## 2021-12-25 MED ORDER — BIOTENE DRY MOUTH MT LIQD: 15.0000 mL | OROMUCOSAL | Status: DC | PRN

## 2021-12-25 MED ORDER — ONDANSETRON 4 MG PO TBDP: 4.0000 mg | ORAL_TABLET | Freq: Four times a day (QID) | ORAL | Status: DC | PRN

## 2021-12-25 MED ORDER — SODIUM ZIRCONIUM CYCLOSILICATE 10 G PO PACK
10.0000 g | PACK | ORAL | Status: AC
  Administered 2021-12-25 (×2): 10 g via ORAL
  Filled 2021-12-25 (×2): qty 1

## 2021-12-25 MED ORDER — LORAZEPAM 2 MG/ML IJ SOLN: 1.0000 mg | INTRAMUSCULAR | Status: DC | PRN

## 2021-12-25 MED ORDER — SENNA 8.6 MG PO TABS
2.0000 | ORAL_TABLET | Freq: Two times a day (BID) | ORAL | Status: DC
  Administered 2021-12-25: 17.2 mg via ORAL
  Filled 2021-12-25 (×2): qty 2

## 2021-12-25 MED ORDER — ONDANSETRON HCL 4 MG/2ML IJ SOLN: 4.0000 mg | Freq: Four times a day (QID) | INTRAMUSCULAR | Status: DC | PRN

## 2021-12-25 MED ORDER — SODIUM CHLORIDE 0.9% IV SOLUTION: Freq: Once | INTRAVENOUS | Status: AC

## 2021-12-25 MED ORDER — HALOPERIDOL LACTATE 2 MG/ML PO CONC
0.5000 mg | ORAL | Status: DC | PRN
  Filled 2021-12-25: qty 0.3

## 2021-12-25 MED ORDER — GLYCOPYRROLATE 1 MG PO TABS
1.0000 mg | ORAL_TABLET | ORAL | Status: DC | PRN
  Filled 2021-12-25: qty 1

## 2021-12-25 MED ORDER — ACETAMINOPHEN 650 MG RE SUPP: 650.0000 mg | Freq: Four times a day (QID) | RECTAL | Status: DC | PRN

## 2021-12-25 MED ORDER — GLYCOPYRROLATE 0.2 MG/ML IJ SOLN: 0.2000 mg | INTRAMUSCULAR | Status: DC | PRN

## 2021-12-25 MED ORDER — LORAZEPAM 1 MG PO TABS: 1.0000 mg | ORAL_TABLET | ORAL | Status: DC | PRN

## 2021-12-25 MED ORDER — ACETAMINOPHEN 325 MG PO TABS
650.0000 mg | ORAL_TABLET | Freq: Four times a day (QID) | ORAL | Status: DC | PRN
Start: 2021-12-25 — End: 2022-01-10
  Administered 2022-01-09: 650 mg via ORAL
  Filled 2021-12-25: qty 2

## 2021-12-25 MED ORDER — POLYVINYL ALCOHOL 1.4 % OP SOLN
1.0000 [drp] | Freq: Four times a day (QID) | OPHTHALMIC | Status: DC | PRN
  Filled 2021-12-25: qty 15

## 2021-12-25 NOTE — Progress Notes (Signed)
FPTS Interim Progress Note ? ?Spoke to wife with updates regarding Mr. Beitz's care. Given new, relatively sudden drops in hemoglobin, there is a concern for an acute bleed as well as worsening electrolyte derangements in setting of CKD-poor candidate for dialysis. Discussed in depth that we felt searching for a cause of the bleed would be more invasive and would not help in prolonging Mr. Lebron's life. Also discussed that he is a very poor candidate for HD and that HD would also not significant prolong life. Wife expressed that she still would like "everything we can do to help him" and she would like "[place] to find the source of the bleed." Topic of goals of care was approached, and we discussed the difference of prolonging patient's life versus improving quality of life. Hospice is not something wife would want at this time. She would still like to continue with placement to SNF and would otherwise like full care and treatment as able. I have informed wife that sudden changes in his status could occur with his gradually worsening state and that I would call her with any new changes.  ? ?Patient's prognosis has worsened since admission, and this was relayed to Mrs. Duanne Limerick. It may be beneficial to have palliative back on board if Mr. Diehl continues to decline rapidly for continued discussions of goals of care.  ? ?For now, holding heparin VTE ppx, Protonix added, transfusing 2nd u prbcs, awaiting post transfusion H/H. She will be in the hospital 4/18 today for a procedure and would like to see her husband during that time. May be beneficial to speak to her face to face regarding his care and provide updates if able.  ? ?Erskine Emery, MD ?12/25/2021, 1:38 AM ?PGY-1, [place] [facility] ?Service pager [phone] ? ?

## 2021-12-25 NOTE — Progress Notes (Signed)
FPTS Brief Note ?Reviewed patient's vitals, recent notes.  ?Vitals:  ? 12/25/21 0915 12/25/21 1221  ?BP: (!) 146/51 (!) 156/60  ?Pulse: (!) 57 (!) 58  ?Resp: 18 19  ?Temp: 97.6 ?F (36.4 ?C) 97.7 ?F (36.5 ?C)  ?SpO2: 99% 100%  ?Comfort care to [facility] hopefully.  ?At this time, no change in plan from day progress note.  ?Erskine Emery, MD ?Page [phone] with questions about this patient.  ? ? ?

## 2021-12-25 NOTE — Progress Notes (Incomplete)
Called MD on call regarding H&H post blood transfusion ?

## 2021-12-25 NOTE — Progress Notes (Signed)
Occupational Therapy Treatment ?Patient Details ?Name: Patrick [name]. ?MRN: [id number] ?DOB: 11-18-1931 ?Today's Date: 12/25/2021 ? ? ?History of present illness 88 y.o. male presenting with uncontrolled HTN, previous fall, unstable ambulation. PMHx is significant for HTN, CKD, dementia, glaucoma, dyslipidemia, h/o prostate cancer, osteoporosis, protein calorie malnutrition, ACD. ?  ?OT comments ? Patient seen for limited activity due to low hemoglobin and treatment ended due to transport arriving to take patient to CT. Patient was min assist to get to EOB and was able to tolerate 10 minutes of static standing to address standing goals for grooming. Patient performed transfer from EOB to 3n1 with RW and min guard. Patient required assistance with BLE to get back to supine. Acute OT to continue to follow.   ? ?Recommendations for follow up therapy are one component of a multi-disciplinary discharge planning process, led by the attending physician.  Recommendations may be updated based on patient status, additional functional criteria and insurance authorization. ?   ?Follow Up Recommendations ? Skilled nursing-short term rehab (<3 hours/day)  ?  ?Assistance Recommended at Discharge Frequent or constant Supervision/Assistance  ?Patient can return home with the following ? A lot of help with walking and/or transfers;A lot of help with bathing/dressing/bathroom;Assistance with feeding;Assistance with cooking/housework;Direct supervision/assist for medications management;Assist for transportation;Direct supervision/assist for financial management;Help with stairs or ramp for entrance ?  ?Equipment Recommendations ? Wheelchair (measurements OT)  ?  ?Recommendations for Other Services   ? ?  ?Precautions / Restrictions Precautions ?Precautions: Fall ?Precaution Comments: SBP drop from supine to standing ?Restrictions ?Weight Bearing Restrictions: No  ? ? ?  ? ?Mobility Bed Mobility ?Overal bed mobility: Needs  Assistance ?Bed Mobility: Supine to Sit, Sit to Supine ?  ?  ?Supine to sit: Min assist ?Sit to supine: Min assist ?  ?General bed mobility comments: assistance to raise trunk to get to EOB and assistance to get LE onto bed to return to supine ?  ? ?Transfers ?Overall transfer level: Needs assistance ?Equipment used: Rolling walker (2 wheels) ?Transfers: Sit to/from Stand, Bed to chair/wheelchair/BSC ?Sit to Stand: Min guard ?  ?  ?Step pivot transfers: Min guard ?  ?  ?General transfer comment: verbal cues for safety and min guard for safety to transfer from EOB to 3n1 ?  ?  ?Balance Overall balance assessment: Needs assistance, History of Falls ?Sitting-balance support: No upper extremity supported ?Sitting balance-Leahy Scale: Fair ?  ?  ?Standing balance support: During functional activity, Bilateral upper extremity supported ?Standing balance-Leahy Scale: Poor ?Standing balance comment: reliant on UE support and was able to tolerate 10 minutes of static standing ?  ?  ?  ?  ?  ?  ?  ?  ?  ?  ?  ?   ? ?ADL either performed or assessed with clinical judgement  ? ?ADL Overall ADL's : Needs assistance/impaired ?  ?  ?  ?  ?  ?  ?  ?  ?  ?  ?  ?  ?Toilet Transfer: Min guard;BSC/3in1;Rolling walker (2 wheels) ?Toilet Transfer Details (indicate cue type and reason): transfer training to 3n1 ?  ?  ?  ?  ?  ?General ADL Comments: limited activity due to low hemoglogen ?  ? ?Extremity/Trunk Assessment   ?  ?  ?  ?  ?  ? ?Vision   ?  ?  ?Perception   ?  ?Praxis   ?  ? ?Cognition Arousal/Alertness: Awake/alert ?Behavior During Therapy: Flat affect ?Overall Cognitive Status:  No family/caregiver present to determine baseline cognitive functioning ?  ?  ?  ?  ?  ?  ?  ?  ?  ?  ?  ?  ?  ?  ?  ?  ?General Comments: followed directions with increased time ?  ?  ?   ?Exercises   ? ?  ?Shoulder Instructions   ? ? ?  ?General Comments    ? ? ?Pertinent Vitals/ Pain       Pain Assessment ?Pain Assessment: No/denies pain ? ?Home  Living   ?  ?  ?  ?  ?  ?  ?  ?  ?  ?  ?  ?  ?  ?  ?  ?  ?  ?  ? ?  ?Prior Functioning/Environment    ?  ?  ?  ?   ? ?Frequency ? Min 2X/week  ? ? ? ? ?  ?Progress Toward Goals ? ?OT Goals(current goals can now be found in the care plan section) ? Progress towards OT goals: Progressing toward goals ? ?Acute Rehab OT Goals ?OT Goal Formulation: Patient unable to participate in goal setting ?Time For Goal Achievement: 12/31/21 ?Potential to Achieve Goals: Fair ?ADL Goals ?Pt Will Transfer to Toilet: with supervision;ambulating ?Pt Will Perform Toileting - Clothing Manipulation and hygiene: with supervision ?Additional ADL Goal #1: Complete grooming adn ADL task standing at sink x 10 min with S  ?Plan Discharge plan remains appropriate   ? ?Co-evaluation ? ? ?   ?  ?  ?  ?  ? ?  ?AM-PAC OT "6 Clicks" Daily Activity     ?Outcome Measure ? ? Help from another person eating meals?: A Little ?Help from another person taking care of personal grooming?: A Little ?Help from another person toileting, which includes using toliet, bedpan, or urinal?: A Lot ?Help from another person bathing (including washing, rinsing, drying)?: A Lot ?Help from another person to put on and taking off regular upper body clothing?: A Little ?Help from another person to put on and taking off regular lower body clothing?: A Lot ?6 Click Score: 15 ? ?  ?End of Session Equipment Utilized During Treatment: Gait belt;Rolling walker (2 wheels) ? ?OT Visit Diagnosis: Unsteadiness on feet (R26.81);Other abnormalities of gait and mobility (R26.89);Muscle weakness (generalized) (M62.81);History of falling (Z91.81);Other symptoms and signs involving cognitive function ?  ?Activity Tolerance Patient tolerated treatment well ?  ?Patient Left in bed;with call bell/phone within reach;Other (comment) (transport in room) ?  ?Nurse Communication Mobility status ?  ? ?   ? ?Time: 0454-0981 ?OT Time Calculation (min): 16 min ? ?Charges: OT General Charges ?$OT Visit:  1 Visit ?OT Treatments ?$Therapeutic Activity: 8-22 mins ? ?Lodema Hong, OTA ?Acute Rehabilitation Services  ?Pager [phone] ?Office [phone] ? ? ?[place] ?12/25/2021, 2:51 PM ?

## 2021-12-25 NOTE — Progress Notes (Signed)
Consulted with [facility] (Amy Esterwood) regarding significant drop in hemoglobin not improving with blood transfusions and no obvious bleeding source at this time. Physical exam is unremarkable. Family requesting full care continuation. There will be a need to evaluate for retroperitoneal bleeding, which would ideally be a CT abd/pel with contrast, but due to patient's renal failure, we will order WITHOUT contrast.  ? ? ?Lizette Pazos, DO  ?

## 2021-12-25 NOTE — Progress Notes (Addendum)
Family Medicine Teaching Service ?Daily Progress Note ?Intern Pager: [phone] ? ?Patient name: Patrick [name]. Medical record number: [id number] ?Date of birth: [birth year] Age: 88 y.o. Gender: male ? ?Primary Care Provider: Steele Sizer, MD ?Consultants: Nephrology, Palliative ?Code Status: DNR ? ?Pt Overview and Major Events to Date:  ?4/9- admitted ? ?Assessment and Plan: ?Patrick [name]. is a 88 y.o. male presenting with uncontrolled HTN, previous fall, unstable ambulation, new AKI. PMHx is significant for HTN, CKD, dementia, glaucoma, dyslipidemia, h/o prostate cancer, osteoporosis, protein calorie malnutrition, ACD, pulmonary HTN.  ? ?Acute on Chronic Anemia  Thrombocytopenia ?Goals of care ?Patient received 1u PRBC but post-transfusion Hgb remained 6.0. Patient received a second unit PRBC overnight. Post-transfusion Hgb 6.5. Patient also with new murmur starting yesterday, suspect 2/2 anemia. LDH 131, haptoglobin and smear pending for hemolysis workup. Unclear if he is having acute blood loss vs bone marrow failure. Patient has not had a BM in several days and has no abdominal pain/tenderness, so low suspicion for GI source. Patient likely would be a poor candidate for aggressive intervention such as colonoscopy/endoscopy but will consult GI to weigh in as well.  ?- F/u hemolytic workup ?- Consult to GI, they recommend STAT CT AP w/o contrast to evaluate for retroperitoneal bleed ?- IV Protonix  '40mg'$  BID  ?- Will re-engage Palliative care given acute change in status ? ?AKI on CKD IV  Hyperkalemia  Not a dialysis candidate ?K ON 6.2 despite receiving Lokelma x2 during the day yesterday. No EKG changes. 2 more doses of Lokelma given overnight with K 5.1 this morning. Cr continues to worsen. I am concerned that this represents progressively failing kidneys in a patient for whom dialysis would not be significantly life-prolonging.  ?- Will continue two doses of Lokelma daily ?- Re-engage palliative  care as above ? ?Orthostasis  HTN ?Orthostatics positive yesterday. Discussed with nursing the importance of offering fluids q4h and assisting pt with meals. Suspect his anemia is also contributing to his symptoms. Lying BP this am 120s/50s. Suspect we would be better off guiding therapy based on his standing BP as opposed to his lying BP. ?- Offer fluids q4h ?- Assist with meals ?- Stopped hydralazine yesterday, will hold home amlodipine and Coreg today and monitor BP/HR ? ?Alzheimer's Disease  Protein Calorie Malnutrition ?- PT/OT ?- Assist with meals ? ?Pericardial effusion  Pulmonary HTN ?No CP/SOB. ?- Outpt follow-up ? ?Constipation ?Still no BM since the 11th ?- MiraLAX BID ?- Increase Senna to 2 tabs BID ?- Dulcolax suppository ? ?FEN/GI: Regular diet ?PPx: Holding for concern for bleed ?Dispo:SNF tomorrow. Barriers include receiving blood today.  ? ?Subjective:  ?Mr. Dahmen denies any acute complaints this morning.  He says that he feels "fine" and he has been able to eat.  He denies any abdominal pain and does not recall when his last bowel movement was. ? ?Objective: ?Temp:  [97.5 ?F (36.4 ?C)-98.6 ?F (37 ?C)] 98 ?F (36.7 ?C) (04/18 0867) ?Pulse Rate:  [54-65] 56 (04/18 0514) ?Resp:  [16-20] 18 (04/18 0514) ?BP: (120-165)/(45-63) 127/56 (04/18 0514) ?SpO2:  [95 %-99 %] 99 % (04/18 0514) ?Physical Exam: ?General: Sleeping on arrival, awakens easily to voice ?Cardiovascular: RRR, 2/6 systolic murmur ?Respiratory: Normal WOB on RA ?Abdomen: Abdomen is soft and non-tender, no mass, no flank pain ?Extremities: 1+ pedal edema bilaterally ? ?Laboratory: ?Recent Labs  ?Lab 12/23/21 ?6195 12/24/21 ?0504 12/24/21 ?2023 12/24/21 ?2108  ?WBC 5.1 5.7 5.3  --   ?HGB 8.0*  7.2* 6.0* 6.3*  ?HCT 23.6* 21.5* 17.9* 19.1*  ?PLT 100* 61* 100*  --   ? ?Recent Labs  ?Lab 12/23/21 ?1012 12/24/21 ?3343 12/24/21 ?2023  ?NA 135 136 135  ?K 5.4* 6.0* 6.2*  ?CL 106 106 103  ?CO2 21* 21* 20*  ?BUN 93* 99* 108*  ?CREATININE 5.91*  6.63* 7.03*  ?CALCIUM 9.9 10.0 9.5  ?GLUCOSE 134* 104* 122*  ? ? ?Imaging/Diagnostic Tests: ?No new imaging, tests ? ?Eppie Gibson, MD ?12/25/2021, 6:06 AM ?PGY-1, [facility] ?[facility] Intern pager: [phone], text pages welcome ? ? ? ?

## 2021-12-25 NOTE — Progress Notes (Signed)
?                                                                                                                                                     ?                                                   ?Palliative Medicine Progress Note  ? ?Patient Name: Patrick [name].       Date: 12/25/2021 ?DOB: [birth year]  Age: 88 y.o. MRN#: [id number] ?Attending Physician: Lenoria Chime, MD ?Primary Care Physician: Steele Sizer, MD ?Admit Date: 12/16/2021 ? ?Reason for Consultation/Follow-up: Goals of care ? ?HPI/Patient Profile: ?88 y.o. male  with past medical history of dementia, CKD stage IV, protein calorie malnutrition, history of prostate cancer, osteoporosis, hypertension, and hyperlipidemia who presented to the emergency department on 12/16/2021 with fall at home and unstable ambulation.  Found to have creatinine of 6.45.  Blood pressure in the ED 180-210/77-100.  He was admitted to [facility] with acute on chronic kidney failure and uncontrolled hypertension. ?  ? ?Subjective: ?Chart reviewed. Creatinine is 6.98 today (it peaked at 7.03 yesterday). Hemoglobin dropped to 6.0 yesterday without obvious signs of bleeding. GI consulted.  ? ?13:00 - I assessed patient at bedside. He is resting comfortable but  awakens easily to voice. I note his breakfast and lunch trays both remain in room, completely untouched.  ? ?15:00 - I returned to bedside to meet with patient's wife Margreta Journey. We discussed his current medical condition and hospital course in detail. Discussed his worsening kidney failure, emphasizing that he is not a good candidate for dialysis due in the setting of weakness and overall failure to thrive. Discussed his poor nutritional status and continued minimal PO intake. Discussed his significant anemia, emphasizing that he would not be a good candidate for invasive diagnostics and procedures. I expressed my concern that Mr Casanova is approaching end of life, and that continued efforts to prolong his life would  not likely change his overall prognosis.  ? ?The difference between full scope medical intervention and comfort care was considered. We reviewed the concept of a comfort path, which involves de-escalating and stopping full scope medical interventions, allowing a natural course to occur. Discussed that the goal is comfort and dignity rather than cure/prolonging life. Encouraged Christina to consider at what point they would want to stop full scope medical interventions, keeping in mind the concept of quality of life. Discussed that Mr. Rao would likely be eligible for residential hospice.  ? ?After further consideration, Margreta Journey decides that she wants to transition to comfort care at this time. Emotional support provided. Discussed comfort care provided in the hospital--keeping  him clean and dry, no labs, no artificial hydration or feeding, no antibiotics, minimizing of medications, comfort feeds, and medication for pain and dyspnea.  ? ? ?Objective: ? ?Physical Exam ?Vitals reviewed.  ?Constitutional:   ?   General: He is not in acute distress. ?   Appearance: He is ill-appearing.  ?Pulmonary:  ?   Effort: Pulmonary effort is normal.  ?Neurological:  ?   Mental Status: He is lethargic.  ?   Motor: Weakness present.  ?Psychiatric:     ?   Cognition and Memory: Cognition is impaired. Memory is impaired.  ?         ? ?Vital Signs: BP (!) 156/60   Pulse (!) 58   Temp 97.7 ?F (36.5 ?C) (Axillary)   Resp 19   Ht '6\' 2"'$  (1.88 m)   Wt 70.4 kg   SpO2 100%   BMI 19.93 kg/m?  ?SpO2: SpO2: 100 % ?O2 Device: O2 Device: Room Air ? ? ?LBM: Last BM Date :  (UTA) ? ?   ?Palliative Assessment/Data: PPS 20% ? ? ? ? ?Palliative Medicine Assessment & Plan  ? ?Assessment: ?Principal Problem: ?  HTN (hypertension) ?Active Problems: ?  Pulmonary hypertension ([place]) ?  Dementia without behavioral disturbance ([place]) ?  Protein-calorie malnutrition, severe ?  Acute kidney insufficiency ?  Acute renal failure superimposed on stage 5  chronic kidney disease, not on chronic dialysis ([place]) ?  Chronic kidney disease ?  ? ?Recommendations/Plan: ?Full comfort measures initiated ?DNR/DNI as previously documented ?Referral to [facility] consult placed ?Added orders for symptom management at EOL  ?Discontinued orders that were not focused on comfort ?PMT will continue to follow  ? ?Symptom Management:  ?Fentanyl prn for pain or dyspnea ?Lorazepam (ATIVAN) prn for anxiety ?Haloperidol (HALDOL) prn for agitation  ?Glycopyrrolate (ROBINUL) for excessive secretions ?Ondansetron (ZOFRAN) prn for nausea ?Polyvinyl alcohol (LIQUIFILM TEARS) prn for dry eyes ?Antiseptic oral rinse (BIOTENE) prn for dry mouth ? ?Prognosis: ? < 2 weeks ? ? ?Care plan was discussed with IMTS ? ?Thank you for allowing the Palliative Medicine Team to assist in the care of this patient. ? ? ?MDM - [facility] ? ? ?Lavena Bullion, NP ? ? ?Please contact Palliative Medicine Team phone at [phone] for questions and concerns.  ?For individual providers, please see [facility]. ? ? ? ? ? ?

## 2021-12-25 NOTE — Progress Notes (Signed)
Nutrition Follow-up ? ?DOCUMENTATION CODES:  ?Severe malnutrition in context of chronic illness ? ?INTERVENTION:  ?-d/c Ensure Enlive ?-Nepro Shake po BID, each supplement provides 425 kcal and 19 grams protein ?-renavite daily  ? ?NUTRITION DIAGNOSIS:  ?Severe Malnutrition related to chronic illness (dementia) as evidenced by severe fat depletion, severe muscle depletion, percent weight loss. -- ongoing ? ?GOAL:  ?Patient will meet greater than or equal to 90% of their needs -- progressing  ? ?MONITOR:  ?PO intake, Supplement acceptance, Labs, Weight trends ? ?REASON FOR ASSESSMENT:  ?Malnutrition Screening Tool ?  ? ?ASSESSMENT:  ?Pt with PMH significant for HTN, CKD IV, dementia, and malnutrition admitted with FTT and AKI. ? ?Pt with new, relatively sudden drops in hgb despite blood transfusions. Per MD, there is concern for an acute bleed as well as worsening electrolyte derangements in the setting of CKD -- pt is a poor candidate for dialysis. MD discussed with pt's wife that pt is a poor HD candidate and that searching for a cause of the bleed would be more invasive and not help in prolonging pt's life. Despite discussions regarding poor prognosis, pt's wife desires for "everything to be done" and is requesting that medical team find the source of possible bleed. Also desiring continued SNF placement. GI consulted, plan for CT w/o contrast.  ? ?Pt reports feeling "fine" and denies any abdominal pain at this time. Pt also denies any issues with eating at this time. Pt is unsure of last BM. MD aware that pt has not had one in several days (1 week). ? ?PO Intake: 0-50% (27.5% avg meal intake) ? ?Pt with orders for Ensure Enlive TID and, per RN, is typically doing well with supplementation. Will transition to Nepro given hyperkalemia.  ? ?UOP: 638m x24 hours ?Emesis: 1550m+ 1x unmeasured occurrence x24 hours ?I/O: +[id number] admit ? ?Admit weight: 67.6 kg ?Current weight: 70.4 kg  ? ?Pt continues to have  non-pitting edema to BLE per RN assessment. ? ?Medications: ? bisacodyl  10 mg Rectal Once  ? pantoprazole (PROTONIX) IV  40 mg Intravenous Q12H  ? polyethylene glycol  17 g Oral BID  ? senna  2 tablet Oral BID  ? sodium zirconium cyclosilicate  10 g Oral Q4HA3FLabs: ?Recent Labs  ?Lab 12/24/21 ?0635732/17/23 ?2023 12/25/21 ?0542  ?NA 136 135 136  ?K 6.0* 6.2* 5.1  ?CL 106 103 105  ?CO2 21* 20* 22  ?BUN 99* 108* 115*  ?CREATININE 6.63* 7.03* 6.98*  ?CALCIUM 10.0 9.5 9.1  ?GLUCOSE 104* 122* 101*  ?Hgb 6.5 ? ?Diet Order:   ?Diet Order   ? ?       ?  Diet renal with fluid restriction Room service appropriate? Yes; Fluid consistency: Thin  Diet effective now       ?  ? ?  ?  ? ?  ? ?EDUCATION NEEDS:  ?Not appropriate for education at this time ? ?Skin:  Skin Assessment: Reviewed RN Assessment ? ?Last BM:  4/11 ? ?Height:  ?Ht Readings from Last 1 Encounters:  ?12/22/21 '6\' 2"'$  (1.88 m)  ? ?Weight:  ?Wt Readings from Last 5 Encounters:  ?12/22/21 70.4 kg  ?09/24/21 73.9 kg  ?08/24/21 75.9 kg  ?06/12/21 76.7 kg  ?06/08/21 75.7 kg  ? ?BMI:  Body mass index is 19.93 kg/m?. ? ?Estimated Nutritional Needs:  ?Kcal:  2000-2200 ?Protein:  100-110 grams ?Fluid:  2L ? ? ?AmaTheone StanleyMS, RD, LDN (she/her/hers) ?RD pager number and weekend/on-call  pager number located in [place]. ?

## 2021-12-25 NOTE — Consult Note (Signed)
? ? ?Consultation ? ?Referring Provider:  Family med service/ Pray MD ?Primary Care Physician:  Steele Sizer, MD ?Primary Gastroenterologist:  none ? ?Reason for Consultation:   progressive anemia ? ?HPI: Edem Tiegs. is a 88 y.o. male who we are asked to see regarding drop in hemoglobin. ?Hemoglobin on 12/16/2021 -10.1> 8.0  4/16>7.2 yesterday-transfuse  1 unit> 6.5 today/platelets 95- receiving blood again today. ? ?He had a syncopal episode/orthostatic event yesterday morning. ?Echo with EF 55 to 60%, CT earlier in admission negative ? ?Patient initially admitted for a fall at home on 12/16/2021 this is in the setting of advanced dementia, chronic kidney disease stage IV, prostate cancer, hypertension, and peripheral vascular disease. ? ?He  has not had any prior GI evaluation in our system, or care everywhere. ? ? ?Patient has had pancytopenia since admission ?Iron studies done last month negative for iron deficiency-ferritin 128 ?Peripheral smear unremarkable ?B12 and folate within normal limits ?No Hemoccults have been done ? ?He has not been anticoagulated ? ?Patient is not a good historian but able to converse, he has no GI complaints, specifically denies any abdominal pain or discomfort, no dysphagia or odynophagia, no nausea or vomiting.  Apparently has not had a bowel movement in at least 2 to 3 days. ?Been eating without difficulty. ? ?Labs from 11/13/2020-WBC 3.4/hemoglobin 9.7/hematocrit 28.8/platelets 156 ? ?Patient was to be transferred to nursing home today, discharge held due to drop in hemoglobin. ? ?Past Medical History:  ?Diagnosis Date  ? Anemia   ? Arthritis   ? Bradycardia   ? CKD (chronic kidney disease), stage IV ([place])   ? GERD (gastroesophageal reflux disease)   ? High cholesterol   ? Hypertension   ? IBS (irritable bowel syndrome)   ? Pneumonia   ? Prostate cancer ([place])   ? Pulmonary HTN ([place]) 11/05/2015  ? 12mHg on echo 2017  ? PVD (peripheral vascular disease) ([place]   ? Syncope  07/2018  ? ? ?Past Surgical History:  ?Procedure Laterality Date  ? BASCILIC VEIN TRANSPOSITION Left 09/05/2016  ? Procedure: FIRST STAGE BASILIC VEIN TRANSPOSITION left arm;  Surgeon: VSerafina Mitchell MD;  [facility]  Service: Vascular;  Laterality: Left;  ? BASCILIC VEIN TRANSPOSITION Left 11/14/2016  ? Procedure: BASCILIC VEIN TRANSPOSITION-LEFT ARM 2ND STAGE;  Surgeon: VSerafina Mitchell MD;  [facility];  Service: Vascular;  Laterality: Left;  ? COLONOSCOPY    ? EYE SURGERY Left 09/11/2017  ? PROSTATE SURGERY    ? PROSTATECTOMY    ? ? ?Prior to Admission medications   ?Medication Sig Start Date End Date Taking? Authorizing Provider  ?amLODipine (NORVASC) 10 MG tablet Take 1 tablet (10 mg total) by mouth daily. 06/12/21  Yes SSteele Sizer MD  ?aspirin 81 MG tablet Take 81 mg by mouth daily.  10/05/09  Yes [provider]  ?hydrALAZINE (APRESOLINE) 25 MG tablet TAKE ONE TABLET BY MOUTH THREE TIMES DAILY ?Patient taking differently: Take 25 mg by mouth 3 (three) times daily. 10/04/21  Yes Turner, TEber Hong MD  ?vitamin C (ASCORBIC ACID) 500 MG tablet Take 500 mg by mouth daily.   Yes [provider]  ?Vitamin E 400 UNITS CHEW Chew 400 Units by mouth daily.    Yes [provider]  ?atorvastatin (LIPITOR) 40 MG tablet TAKE ONE TABLET BY MOUTH EVERY EVENING 12/24/21   SSteele Sizer MD  ? ? ?Current Facility-Administered Medications  ?Medication Dose Route Frequency Provider Last Rate Last Admin  ?  acetaminophen (TYLENOL) tablet 650 mg  650 mg Oral Q6H PRN France Ravens, MD   650 mg at 12/22/21 1839  ? atorvastatin (LIPITOR) tablet 40 mg  40 mg Oral QPM Erskine Emery, MD   40 mg at 12/24/21 1749  ? bisacodyl (DULCOLAX) suppository 10 mg  10 mg Rectal Once Eppie Gibson, MD      ? feeding supplement (ENSURE ENLIVE / ENSURE PLUS) liquid 237 mL  237 mL Oral TID BM Martyn Malay, MD   237 mL at 12/24/21 2136  ? pantoprazole (PROTONIX) injection 40 mg  40 mg Intravenous Q12H Lenoria Chime, MD   40 mg at 12/25/21 0901  ? polyethylene glycol (MIRALAX / GLYCOLAX) packet 17 g  17 g Oral BID Eppie Gibson, MD   17 g at 12/25/21 0902  ? senna (SENOKOT) tablet 17.2 mg  2 tablet Oral BID Eppie Gibson, MD   17.2 mg at 12/25/21 0902  ? sodium zirconium cyclosilicate (LOKELMA) packet 10 g  10 g Oral Q4H Eppie Gibson, MD   10 g at 12/25/21 0902  ? ? ?Allergies as of 12/16/2021 - Review Complete 12/16/2021  ?Allergen Reaction Noted  ? Ace inhibitors Cough 03/20/2015  ? Aricept [donepezil] Nausea And Vomiting 06/12/2021  ? ? ?Family History  ?Problem Relation Age of Onset  ? CVA Mother   ? Hypertension Mother   ? Dementia Mother   ? Heart attack Father   ? Heart disease Father   ?     before age 11  ? Hypertension Father   ? Hepatitis C Sister   ? Dementia Sister   ? ? ?Social History  ? ?Socioeconomic History  ? Marital status: Married  ?  Spouse name: Altha Harm  ? Number of children: 1  ? Years of education: Not on file  ? Highest education level: 6th grade  ?Occupational History  ? Occupation: Retired   ?Tobacco Use  ? Smoking status: Former  ?  Packs/day: 0.30  ?  Years: 20.00  ?  Pack years: 6.00  ?  Types: Cigarettes  ?  Quit date: 08/26/1978  ?  Years since quitting: 43.3  ? Smokeless tobacco: Never  ? Tobacco comments:  ?  smoking cessation materials not required  ?Vaping Use  ? Vaping Use: Never used  ?Substance and Sexual Activity  ? Alcohol use: No  ?  Alcohol/week: 0.0 standard drinks  ? Drug use: No  ? Sexual activity: Not Currently  ?Other Topics Concern  ? Not on file  ?Social History Narrative  ? Lives w/ wife  ? Caffeine use: none  ? Right handed   ? He retired 07/2018 because of medical problems  ? ?Social Determinants of Health  ? ?Financial Resource Strain: Low Risk   ? Difficulty of Paying Living Expenses: Not hard at all  ?Food Insecurity: No Food Insecurity  ? Worried About Charity fundraiser in the Last Year: Never true  ? Ran Out of Food in the Last Year: Never true   ?Transportation Needs: No Transportation Needs  ? Lack of Transportation (Medical): No  ? Lack of Transportation (Non-Medical): No  ?Physical Activity: Inactive  ? Days of Exercise per Week: 0 days  ? Minutes of Exercise per Session: 0 min  ?Stress: No Stress Concern Present  ? Feeling of Stress : Not at all  ?Social Connections: Moderately Integrated  ? Frequency of Communication with Friends and Family: More than three times a week  ?  Frequency of Social Gatherings with Friends and Family: Never  ? Attends Religious Services: More than 4 times per year  ? Active Member of Clubs or Organizations: No  ? Attends Archivist Meetings: Never  ? Marital Status: Married  ?Intimate Partner Violence: Not At Risk  ? Fear of Current or Ex-Partner: No  ? Emotionally Abused: No  ? Physically Abused: No  ? Sexually Abused: No  ? ? ?Review of Systems:pt unable to provide ?. ? ?Physical Exam: ?Vital signs in last 24 hours: ?Temp:  [97.5 ?F (36.4 ?C)-98.6 ?F (37 ?C)] 97.6 ?F (36.4 ?C) (04/18 0915) ?Pulse Rate:  [54-65] 57 (04/18 0915) ?Resp:  [16-20] 18 (04/18 0915) ?BP: (120-165)/(45-63) 146/51 (04/18 0915) ?SpO2:  [95 %-99 %] 99 % (04/18 0915) ?Last BM Date :  (UTA) ?General:   Alert,  Well-developed, elderly African-American male pleasant and cooperative in NAD ?Head:  Normocephalic and atraumatic. ?Eyes:  Sclera clear, no icterus.   Conjunctiva pale ?Ears:  Normal auditory acuity. ?Nose:  No deformity, discharge,  or lesions. ?Mouth:  No deformity or lesions.   ?Neck:  Supple; no masses or thyromegaly. ?Lungs:  Clear throughout to auscultation.   No wheezes, crackles, or rhonchi. ? Heart:  Regular rate and rhythm; no murmurs, clicks, rubs,  or gallops. ?Abdomen:  Soft,nontender, BS active,nonpalp mass or hsm.   ?Rectal: Not done ?Msk:  Symmetrical without gross deformities. [name] Kitchen ?Pulses:  Normal pulses noted. ?Extremities:  Without clubbing or edema. ?Neurologic:  Alert and  oriented x2  grossly normal  neurologically. ?Skin:  Intact without significant lesions or rashes.[name] Kitchen ?Psych:  Alert and cooperative. Normal mood and affect. ? ?Intake/Output from previous day: ?04/17 0701 - 04/18 0700 ?In: 1810.2 [P.O.:1185; Blood:625.2

## 2021-12-26 DIAGNOSIS — D649 Anemia, unspecified: Secondary | ICD-10-CM | POA: Diagnosis not present

## 2021-12-26 DIAGNOSIS — F0390 Unspecified dementia without behavioral disturbance: Secondary | ICD-10-CM | POA: Diagnosis not present

## 2021-12-26 DIAGNOSIS — R58 Hemorrhage, not elsewhere classified: Secondary | ICD-10-CM | POA: Diagnosis not present

## 2021-12-26 DIAGNOSIS — N179 Acute kidney failure, unspecified: Secondary | ICD-10-CM | POA: Diagnosis not present

## 2021-12-26 DIAGNOSIS — I10 Essential (primary) hypertension: Secondary | ICD-10-CM | POA: Diagnosis not present

## 2021-12-26 LAB — BPAM RBC
Blood Product Expiration Date: 202305172359
Blood Product Expiration Date: 202305192359
Blood Product Expiration Date: 202305222359
ISSUE DATE / TIME: 202304171510
ISSUE DATE / TIME: 202304172259
ISSUE DATE / TIME: 202304180846
Unit Type and Rh: 1700
Unit Type and Rh: 1700
Unit Type and Rh: 1700

## 2021-12-26 LAB — TYPE AND SCREEN
ABO/RH(D): B NEG
Antibody Screen: NEGATIVE
Unit division: 0
Unit division: 0
Unit division: 0
Weak D: POSITIVE

## 2021-12-26 LAB — HAPTOGLOBIN: Haptoglobin: 109 mg/dL (ref 38–329)

## 2021-12-26 NOTE — Progress Notes (Signed)
Family Medicine Teaching Service ?Daily Progress Note ?Intern Pager: [phone] ? ?Patient name: Patrick [name]. Medical record number: [id number] ?Date of birth: 1932-01-29 Age: 88 y.o. Gender: male ? ?Primary Care Provider: Steele Sizer, MD ?Consultants: Nephrology, Palliative ?Code Status: DNR ? ?Pt Overview and Major Events to Date:  ?4/9- admitted ?4/18- made comfort care ? ?Assessment and Plan: ?Patrick P Harwood Nall. is a 88 y.o. male presenting with uncontrolled HTN, previous fall, unstable ambulation, new AKI. PMHx is significant for HTN, CKD, dementia, glaucoma, dyslipidemia, h/o prostate cancer, osteoporosis, protein calorie malnutrition, ACD, pulmonary HTN. Patient is now full comfort care. ? ?Comfort Care ?Patient found to have recurrent hyperkalemia and anemia not responsive to transfusions with bilateral retroperitoneal hematomas on abdominal imaging. He is now full comfort care and family is hopeful for discharge to [facility] if able. He is comfortable and alert this morning.  Did not require any as needed comfort meds overnight. ?-Palliative following ?-Hopeful for discharge to [facility] today versus tomorrow ?-Comfort care orders in place: Haldol, Ativan, Robinul, fentanyl, Zofran ?-POAL ? ?FEN/GI: Regular diet ?PPx: None., comfort ?Dispo: [facility]   today vs tomorrow . Barriers include placement.  ? ?Subjective:  ?[name] reports feeling well this morning.  He states "nothing new".  When asked if he feels after breakfast he responds "oh yeah!" ? ?Objective: ?Temp:  [97.7 ?F (36.5 ?C)-98 ?F (36.7 ?C)] 97.7 ?F (36.5 ?C) (04/19 0830) ?Pulse Rate:  [58-67] 67 (04/19 0830) ?Resp:  [16-19] 16 (04/19 0830) ?BP: (135-156)/(60-68) 135/68 (04/19 0830) ?SpO2:  [98 %-100 %] 98 % (04/19 0830) ?Physical Exam: ?General: Frail and elderly, smiling and NAD ?Cardiovascular: Regular rate and rhythm, 2/6 systolic murmur ?Respiratory: Normal work of breathing on room air ?Abdomen: Soft and nontender,  without flank pain ?Extremities: Bilateral pedal edema ? ?Laboratory: ?No new labs ? ? ?Imaging/Diagnostic Tests: ?No new imaging, tests ? ?Eppie Gibson, MD ?12/26/2021, 9:17 AM ?PGY-1, [place] ?[place] Intern pager: [phone], text pages welcome ? ?

## 2021-12-26 NOTE — Progress Notes (Signed)
Patient [name].      DOB: Nov 08, 1931      VPX:[id number] ? ? ? ?  ?Palliative Medicine Team ? ? ? ?Subjective: Bedside symptom check completed. No family or visitors at this time. ? ? ?Physical exam: Patient sitting up in bed, alert and pleasantly conversational at time of visit. Breathing even and non-labored, no excessive secretions noted. Patient without physical or non-verbal signs of pain or discomfort at this time.  ? ? ?Assessment and plan: Patient without any concerns or needs at this time. Denies pain or discomfort. Bedside RN, Patrick [name] without any needs. Will continue to follow for any changes or advances.  ? ? ?Thank you for allowing the Palliative Medicine Team to assist in the care of this patient. ?  ?  ?Patrick Leavell, MSN, RN ?Palliative Medicine Team ?Team Phone: [phone]  ?This phone is monitored 7a-7p, please reach out to attending physician outside of these hours for urgent needs.   ?

## 2021-12-26 NOTE — Progress Notes (Addendum)
?                                                                                                                                                     ?                                                   ?Palliative Medicine Progress Note  ? ?Patient Name: Patrick [name].       Date: 12/26/2021 ?DOB: 08/10/1932  Age: 88 y.o. MRN#: [id number] ?Attending Physician: Kinnie Feil, MD ?Primary Care Physician: Steele Sizer, MD ?Admit Date: 12/16/2021 ? ?Reason for Consultation/Follow-up: Symptom management, disposition ? ?HPI/Patient Profile: ?88 y.o. male  with past medical history of dementia, CKD stage IV, protein calorie malnutrition, history of prostate cancer, osteoporosis, hypertension, and hyperlipidemia who presented to the emergency department on 12/16/2021 with fall at home and unstable ambulation.  Found to have creatinine of 6.45.  Blood pressure in the ED 180-210/77-100.  He was admitted to [facility] with acute on chronic kidney failure and uncontrolled hypertension. ?  ? ?Subjective: ?I assessed patient at bedside.  He is resting comfortably.  He awakens easily to voice.  He has no acute complaints.  Note that his lunch tray remains in the room - it appears to have only a few bites taken. ? ?I spoke with his wife Altha Harm by phone.  Reviewed natural trajectory at end-of-life.  Emotional support provided.  Discussed that Mr. Greenhaw is being evaluated for [facility] with eligibility pending at this time.  She has no additional questions or concerns. ? ? ?Objective: ? ?Physical Exam ?Vitals reviewed.  ?Constitutional:   ?   General: He is not in acute distress. ?   Appearance: He is ill-appearing.  ?Pulmonary:  ?   Effort: Pulmonary effort is normal.  ?Neurological:  ?   Mental Status: He is lethargic.  ?   Motor: Weakness present.  ?   Comments: Oriented to self  ?Psychiatric:     ?   Cognition and Memory: Cognition is impaired. Memory is impaired.  ?         ? ?Vital Signs: BP (!) 123/48   Pulse 63    Temp 97.8 ?F (36.6 ?C) (Oral)   Resp 15   Ht '6\' 2"'$  (1.88 m)   Wt 70.4 kg   SpO2 99%   BMI 19.93 kg/m?  ?SpO2: SpO2: 99 % ?O2 Device: O2 Device: Room Air ? ? ?LBM: Last BM Date : 12/25/21 ? ?   ?Palliative Assessment/Data: PPS 20% ? ? ? ? ?Palliative Medicine Assessment & Plan  ? ?Assessment: ?Principal Problem: ?  HTN (hypertension) ?Active Problems: ?  Pulmonary hypertension (  [place]) ?  Dementia without behavioral disturbance ([place]) ?  Protein-calorie malnutrition, severe ?  Acute kidney insufficiency ?  Acute renal failure superimposed on stage 5 chronic kidney disease, not on chronic dialysis ([place]) ?  Chronic kidney disease ?  Pressure injury of skin ?  ? ?Recommendations/Plan: ?Continue full comfort measures ?DNR/DNI as previously documented ?[facility] ?PRN medications are available for symptom management at EOL ?PMT will continue to follow ? ?Symptom Management:  ?Fentanyl prn for pain or dyspnea ?Lorazepam (ATIVAN) prn for anxiety ?Haloperidol (HALDOL) prn for agitation  ?Glycopyrrolate (ROBINUL) for excessive secretions ?Ondansetron (ZOFRAN) prn for nausea ?Polyvinyl alcohol (LIQUIFILM TEARS) prn for dry eyes ?Antiseptic oral rinse (BIOTENE) prn for dry mouth ? ? ?Prognosis: ? < 2 weeks ? ? ?Thank you for allowing the Palliative Medicine Team to assist in the care of this patient. ? ? ?MDM - moderate ? ? ?Lavena Bullion, NP ? ? ?Please contact Palliative Medicine Team phone at [phone] for questions and concerns.  ?For individual providers, please see AMION. ? ? ? ? ? ?

## 2021-12-26 NOTE — TOC Progression Note (Signed)
Transition of Care (TOC) - Initial/Assessment Note  ? ? ?Patient Details  ?Name: Patrick [name]. ?MRN: [id number] ?Date of Birth: 1931/10/01 ? ?Transition of Care (TOC) CM/SW Contact:    ?Paulene Floor Massa Pe, LCSWA ?Phone Number: ?12/26/2021, 12:26 PM ? ?Clinical Narrative:                 ?CSW contacted [employer] and requested that they review the patient for inpatient hospice.  ? ?Expected Discharge Plan: [place] ?Barriers to Discharge: Ship broker, SNF Pending bed offer ? ? ?Patient Goals and CMS Choice ?  ?CMS Medicare.gov Compare Post Acute Care list provided to:: Patient Represenative (must comment) ?Choice offered to / list presented to : Spouse ? ?Expected Discharge Plan and Services ?Expected Discharge Plan: [place] ?  ?  ?  ?Living arrangements for the past 2 months: Apartment ?                ?  ?  ?  ?  ?  ?  ?  ?  ?  ?  ? ?Prior Living Arrangements/Services ?Living arrangements for the past 2 months: Apartment ?Lives with:: Spouse ?Patient language and need for interpreter reviewed:: Yes ?       ?  ?Care giver support system in place?: Yes (comment) ?  ?Criminal Activity/Legal Involvement Pertinent to Current Situation/Hospitalization: No - Comment as needed ? ?Activities of Daily Living ?Home Assistive Devices/Equipment: None ?ADL Screening (condition at time of admission) ?Patient's cognitive ability adequate to safely complete daily activities?: No ?Is the patient deaf or have difficulty hearing?: No ?Does the patient have difficulty seeing, even when wearing glasses/contacts?: No ?Does the patient have difficulty concentrating, remembering, or making decisions?: Yes ?Patient able to express need for assistance with ADLs?: Yes ?Does the patient have difficulty dressing or bathing?: Yes ?Independently performs ADLs?: No ?Communication: Independent ?Dressing (OT): Needs assistance ?Is this a change from baseline?: Pre-admission baseline ?Feeding: Needs  assistance ?Is this a change from baseline?: Pre-admission baseline ?Toileting: Needs assistance ?Does the patient have difficulty walking or climbing stairs?: Yes ?Weakness of Legs: Both ?Weakness of Arms/Hands: Both ? ?Permission Sought/Granted ?  ?Permission granted to share information with : Yes, Verbal Permission Granted ?   ? Permission granted to share info w AGENCY: SNF per spouse ?   ?   ? ?Emotional Assessment ?  ?  ?  ?Orientation: : Oriented to Self ?Alcohol / Substance Use: Not Applicable ?Psych Involvement: No (comment) ? ?Admission diagnosis:  HTN (hypertension) [I10] ?Hypertensive emergency [I16.1] ?Acute renal failure superimposed on stage 5 chronic kidney disease, not on chronic dialysis, unspecified acute renal failure type ([place]) [N17.9, N18.5] ?Chronic kidney disease [N18.9] ?Patient Active Problem List  ? Diagnosis Date Noted  ? Pressure injury of skin 12/25/2021  ? Chronic kidney disease 12/24/2021  ? Acute renal failure superimposed on stage 5 chronic kidney disease, not on chronic dialysis ([place])   ? Protein-calorie malnutrition, severe 12/18/2021  ? Acute kidney insufficiency 12/18/2021  ? HTN (hypertension) 12/16/2021  ? Unstable angina pectoris ([place]) 07/14/2020  ? Mild protein-calorie malnutrition ([place]) 07/14/2020  ? Mild major depression ([place]) 01/10/2020  ? CKD (chronic kidney disease) stage 3, GFR 30-59 ml/min (HCC) 04/05/2019  ? Metabolic acidosis, normal anion gap (NAG) 07/27/2018  ? Dementia without behavioral disturbance ([place]) 07/27/2018  ? Glaucoma 07/27/2018  ? Secondary hyperparathyroidism ([place]) 11/26/2016  ? Elevated prostate specific antigen (PSA) 09/08/2015  ? Bone pain 06/13/2015  ? Spinal stenosis 06/13/2015  ?  OP (osteoporosis) 03/20/2015  ? Allergic rhinitis 03/20/2015  ? Lumbar canal stenosis 03/20/2015  ? Vitamin D deficiency 03/20/2015  ? Restless legs syndrome 03/20/2015  ? Drug-induced gynecomastia 03/20/2015  ? Diaphragmatic hernia 03/20/2015  ? Acquired cyst of  kidney 03/20/2015  ? Left ventricular hypertrophy by electrocardiogram 03/20/2015  ? Leg pain 03/20/2015  ? History of pneumonia 03/20/2015  ? Non-rheumatic tricuspid valve insufficiency 03/20/2015  ? Dysmetabolic syndrome 04/54/0981  ? Pulmonary hypertension ([place]) 10/31/2014  ? Benign essential HTN 07/29/2014  ? Dyslipidemia 07/29/2014  ? PVD (peripheral vascular disease) ([place])   ? Anemia of chronic disease   ? IBS (irritable bowel syndrome)   ? Bradycardia   ? ED (erectile dysfunction) of organic origin 06/29/2012  ? Genuine stress incontinence, male 06/29/2012  ? Malignant neoplasm of prostate ([place]) 06/29/2012  ? ?PCP:  Steele Sizer, MD ?Pharmacy:   ?[facility] [place] ?[place] ?[place] [address] ?Phone: [phone] Fax: [phone] ? ?[facility] - [place], [place] - [address] ?[address] ?[place] [address] ?Phone: [phone] Fax: [phone] ? ? ? ? ?Social Determinants of Health (SDOH) Interventions ?  ? ?Readmission Risk Interventions ?   ? View : No data to display.  ?  ?  ?  ? ? ? ?

## 2021-12-26 NOTE — Progress Notes (Signed)
FPTS Brief Note ?Reviewed patient's vitals, recent notes.  ?Vitals:  ? 12/26/21 0830 12/26/21 1605  ?BP: 135/68 (!) 123/48  ?Pulse: 67 63  ?Resp: 16 15  ?Temp: 97.7 ?F (36.5 ?C) 97.8 ?F (36.6 ?C)  ?SpO2: 98% 99%  ? ?At this time, no change in plan from day progress note.  ?Erskine Emery, MD ?Page [phone] with questions about this patient.  ? ? ?

## 2021-12-26 NOTE — Progress Notes (Signed)
[facility]) Hospital Liaison Note  Referral received for patient/family interest in [facility]. Chart under review by [facility] physician.   Hospice eligibility pending.   Please call with any questions or concerns. Thank you   Shanita Wicker, LCSW [facility] Hospital Liaison  [phone] 

## 2021-12-27 ENCOUNTER — Other Ambulatory Visit: Payer: Self-pay | Admitting: Hospice

## 2021-12-27 DIAGNOSIS — D649 Anemia, unspecified: Secondary | ICD-10-CM | POA: Diagnosis not present

## 2021-12-27 DIAGNOSIS — F0390 Unspecified dementia without behavioral disturbance: Secondary | ICD-10-CM | POA: Diagnosis not present

## 2021-12-27 DIAGNOSIS — I10 Essential (primary) hypertension: Secondary | ICD-10-CM | POA: Diagnosis not present

## 2021-12-27 NOTE — Progress Notes (Signed)
[employer] [facility]) Hospital Liaison Note ? ?Unfortunately, this patient is not eligible for [facility].  ? ?Please call with any questions or concerns. Thank you ? ?Roselee Nova, LCSW ?[facility] Liaison ?[phone] ?

## 2021-12-27 NOTE — Progress Notes (Addendum)
Patient Patrick [name].      DOB: 1932/08/15      [id number] ? ? ? ?  ?Palliative Medicine Team ? ? ? ?Subjective: Bedside symptom check completed. No family or visitors present. ? ? ?Physical exam: Patient sitting up in bed with lunch tray uneaten in front of him. Breathing even and non-labored, no excessive secretions noted. Patient without physical or non-verbal signs of pain or discomfort at this time. Patient able to move himself around in the bed, watching tv comfortably. ? ? ?Assessment and plan: Patient without any concerns or pain this morning. He remains stable for transfer, waiting to hear from [facility] regarding residential status. Bedside RN Allyson without any needs or concerns. Will continue to follow for any changes or advances.  ? ? ?Thank you for allowing the Palliative Medicine Team to assist in the care of this patient. ?  ?  ?Damian Leavell, MSN, RN ?Palliative Medicine Team ?Team Phone: [phone]  ?This phone is monitored 7a-7p, please reach out to attending physician outside of these hours for urgent needs.   ?

## 2021-12-27 NOTE — Care Management Important Message (Signed)
Important Message ? ?Patient Details  ?Name: Patrick [name]. ?MRN: [id number] ?Date of Birth: 11-08-1931 ? ? ?Medicare Important Message Given:  Yes ? ? ? ? ?Patrick [name] ?12/27/2021, 2:01 PM ?

## 2021-12-27 NOTE — Plan of Care (Signed)
  Problem: Nutrition: Goal: Adequate nutrition will be maintained Outcome: Progressing   Problem: Coping: Goal: Level of anxiety will decrease Outcome: Progressing   

## 2021-12-27 NOTE — Progress Notes (Signed)
Family Medicine Teaching Service ?Daily Progress Note ?Intern Pager: [phone] ? ?Patient name: Patrick [name] [name]. Medical record number: [id number] ?Date of birth: 08-12-1932 Age: 88 y.o. Gender: male ? ?Primary Care Provider: Steele Sizer, MD ?Consultants: Nephro, Palliative ?Code Status: DNR ? ?Pt Overview and Major Events to Date:  ?4/9- admitted ?4/18- made comfort care ? ?Assessment and Plan: ?Patrick [name] P Patrick [name] [name]. is a 88 y.o. male presenting with uncontrolled HTN, previous fall, unstable ambulation, new AKI.  Subsequently found to have spontaneous bilateral retroperitoneal bleeds. PMHx is significant for HTN, CKD, dementia, glaucoma, dyslipidemia, h/o prostate cancer, osteoporosis, protein calorie malnutrition, ACD, pulmonary HTN. Patient is now full comfort care and stable for transfer to  [facility] when a bed comes available. ?  ?Comfort Care ?Patient is quite comfortable this morning. No complaints. Did not require any PRN comfort meds overnight. Anticipate discharge to [facility], hopefully today.  ?- Palliative following ?- Comfort care orders in place: Haldol, Ativan, Robinul, Fentanyl, Zofran ?- POAL ? ? ?FEN/GI: Regular ?PPx: None- comfort ?Dispo: [facility]  today. Barriers include placement.  ? ?Subjective:  ?Patrick [name] [name] states that he is "very comfortable" this morning. No complaints at this time.  ? ?Objective: ?Temp:  [97.8 ?F (36.6 ?C)-98.2 ?F (36.8 ?C)] 98.2 ?F (36.8 ?C) (04/20 0428) ?Pulse Rate:  [55-63] 55 (04/20 0428) ?Resp:  [15-18] 18 (04/20 0428) ?BP: (123-156)/(48-68) 156/68 (04/20 0428) ?SpO2:  [98 %-99 %] 98 % (04/20 0428) ?Physical Exam: ?General: Frail, in good spirits, NAD ?HENT: Marked conjunctival pallor ?Respiratory: Normal WOB on RA ?Abdomen: Non-tender, no flank tenderness ? ?Laboratory: ?No new labs ? ?Imaging/Diagnostic Tests: ?No new imaging, tests ? ?Eppie Gibson, MD ?12/27/2021, 8:32 AM ?PGY-1, [place] [facility] ?[place] Intern pager: [phone], text pages  welcome ? ?

## 2021-12-27 NOTE — Progress Notes (Signed)
FPTS Brief Note ?Reviewed patient's vitals, recent notes.  ?Vitals:  ? 12/26/21 1605 12/27/21 0428  ?BP: (!) 123/48 (!) 156/68  ?Pulse: 63 (!) 55  ?Resp: 15 18  ?Temp: 97.8 ?F (36.6 ?C) 98.2 ?F (36.8 ?C)  ?SpO2: 99% 98%  ? ?At this time, no change in plan from day progress note.  ?Erskine Emery, MD ?Page [phone] with questions about this patient.  ? ? ?

## 2021-12-28 DIAGNOSIS — R58 Hemorrhage, not elsewhere classified: Secondary | ICD-10-CM | POA: Diagnosis not present

## 2021-12-28 DIAGNOSIS — N179 Acute kidney failure, unspecified: Secondary | ICD-10-CM | POA: Diagnosis not present

## 2021-12-28 DIAGNOSIS — Z789 Other specified health status: Secondary | ICD-10-CM

## 2021-12-28 DIAGNOSIS — F0390 Unspecified dementia without behavioral disturbance: Secondary | ICD-10-CM | POA: Diagnosis not present

## 2021-12-28 DIAGNOSIS — D649 Anemia, unspecified: Secondary | ICD-10-CM | POA: Diagnosis not present

## 2021-12-28 DIAGNOSIS — I10 Essential (primary) hypertension: Secondary | ICD-10-CM | POA: Diagnosis not present

## 2021-12-28 LAB — CBC
HCT: 24.7 % — ABNORMAL LOW (ref 39.0–52.0)
Hemoglobin: 8.4 g/dL — ABNORMAL LOW (ref 13.0–17.0)
MCH: 31 pg (ref 26.0–34.0)
MCHC: 34 g/dL (ref 30.0–36.0)
MCV: 91.1 fL (ref 80.0–100.0)
Platelets: 132 10*3/uL — ABNORMAL LOW (ref 150–400)
RBC: 2.71 MIL/uL — ABNORMAL LOW (ref 4.22–5.81)
RDW: 14.6 % (ref 11.5–15.5)
WBC: 5.3 10*3/uL (ref 4.0–10.5)
nRBC: 0 % (ref 0.0–0.2)

## 2021-12-28 NOTE — [place] FL2 (Signed)
?[place] MEDICAID FL2 LEVEL OF CARE SCREENING TOOL  ?  ? ?IDENTIFICATION  ?Patient Name: ?Patrick [name]. Birthdate: November 17, 1931 Sex: male Admission Date (Current Location): ?12/16/2021  ?[place] and [place] Number: ? [place] ?  Facility and Address:  ?[facility] [place] [address] [place], [place] [address] ?     Provider Number: ?[id number]  ?Attending Physician Name and Address:  ?Kinnie Feil, MD ? Relative Name and Phone Number:  ?Akiva Brassfield, [phone] ?   ?Current Level of Care: ?Hospital Recommended Level of Care: ?[place] Prior Approval Number: ?  ? ?Date Approved/Denied: ?  PASRR Number: ?[id number] A ? ?Discharge Plan: ?SNF ?  ? ?Current Diagnoses: ?Patient Active Problem List  ? Diagnosis Date Noted  ? Pressure injury of skin 12/25/2021  ? Chronic kidney disease 12/24/2021  ? Acute renal failure superimposed on stage 5 chronic kidney disease, not on chronic dialysis ([place])   ? Protein-calorie malnutrition, severe 12/18/2021  ? Acute kidney insufficiency 12/18/2021  ? HTN (hypertension) 12/16/2021  ? Unstable angina pectoris ([place]) 07/14/2020  ? Mild protein-calorie malnutrition ([place]) 07/14/2020  ? Mild major depression ([place]) 01/10/2020  ? CKD (chronic kidney disease) stage 3, GFR 30-59 ml/min (HCC) 04/05/2019  ? Metabolic acidosis, normal anion gap (NAG) 07/27/2018  ? Dementia without behavioral disturbance ([place]) 07/27/2018  ? Glaucoma 07/27/2018  ? Secondary hyperparathyroidism ([place]) 11/26/2016  ? Elevated prostate specific antigen (PSA) 09/08/2015  ? Bone pain 06/13/2015  ? Spinal stenosis 06/13/2015  ? OP (osteoporosis) 03/20/2015  ? Allergic rhinitis 03/20/2015  ? Lumbar canal stenosis 03/20/2015  ? Vitamin D deficiency 03/20/2015  ? Restless legs syndrome 03/20/2015  ? Drug-induced gynecomastia 03/20/2015  ? Diaphragmatic hernia 03/20/2015  ? Acquired cyst of kidney 03/20/2015  ? Left ventricular hypertrophy by electrocardiogram 03/20/2015  ?  Leg pain 03/20/2015  ? History of pneumonia 03/20/2015  ? Non-rheumatic tricuspid valve insufficiency 03/20/2015  ? Dysmetabolic syndrome 38/06/1750  ? Pulmonary hypertension ([place]) 10/31/2014  ? Benign essential HTN 07/29/2014  ? Dyslipidemia 07/29/2014  ? PVD (peripheral vascular disease) ([place])   ? Acute anemia   ? IBS (irritable bowel syndrome)   ? Bradycardia   ? ED (erectile dysfunction) of organic origin 06/29/2012  ? Genuine stress incontinence, male 06/29/2012  ? Malignant neoplasm of prostate ([place]) 06/29/2012  ? ? ?Orientation RESPIRATION BLADDER Height & Weight   ?  ?Self, Place ? Normal Incontinent, External catheter Weight: 155 lb 3.3 oz (70.4 kg) ?Height:  '6\' 2"'$  (188 cm)  ?BEHAVIORAL SYMPTOMS/MOOD NEUROLOGICAL BOWEL NUTRITION STATUS  ?    Incontinent Diet (See DC summary)  ?AMBULATORY STATUS COMMUNICATION OF NEEDS Skin   ?Supervision Verbally Normal ?  ?  ?  ?    ?     ?     ? ? ?Personal Care Assistance Level of Assistance  ?Bathing, Feeding, Dressing Bathing Assistance: Limited assistance ?Feeding assistance: Independent ?Dressing Assistance: Limited assistance ?   ? ?Functional Limitations Info  ?Sight, Hearing, Speech Sight Info: Adequate ?Hearing Info: Adequate ?Speech Info: Adequate  ? ? ?SPECIAL CARE FACTORS FREQUENCY  ?PT (By licensed PT), OT (By licensed OT)   ?  ?PT Frequency: none/ comfort ?OT Frequency: none/ comfort ?  ?  ?  ?   ? ? ?Contractures Contractures Info: Not present  ? ? ?Additional Factors Info  ?Code Status, Allergies Code Status Info: DNR ?Allergies Info: Ace Inhibitors, Aricept ?  ?  ?  ?   ? ?Current Medications (12/28/2021):  This is the current hospital active medication list ?Current Facility-Administered Medications  ?Medication Dose Route Frequency Provider Last Rate Last Admin  ? acetaminophen (TYLENOL) tablet 650 mg  650 mg Oral Q6H PRN Lavena Bullion, NP      ? Or  ? acetaminophen (TYLENOL) suppository 650 mg  650 mg Rectal Q6H PRN Lavena Bullion, NP      ?  antiseptic oral rinse (BIOTENE) solution 15 mL  15 mL Topical PRN Elie Confer B, NP      ? fentaNYL (SUBLIMAZE) injection 25-50 mcg  25-50 mcg Intravenous Q2H PRN Lavena Bullion, NP      ? glycopyrrolate (ROBINUL) tablet 1 mg  1 mg Oral Q4H PRN Lavena Bullion, NP      ? Or  ? glycopyrrolate (ROBINUL) injection 0.2 mg  0.2 mg Subcutaneous Q4H PRN Lavena Bullion, NP      ? Or  ? glycopyrrolate (ROBINUL) injection 0.2 mg  0.2 mg Intravenous Q4H PRN Elie Confer B, NP      ? haloperidol (HALDOL) tablet 0.5 mg  0.5 mg Oral Q4H PRN Lavena Bullion, NP      ? Or  ? haloperidol (HALDOL) 2 MG/ML solution 0.5 mg  0.5 mg Sublingual Q4H PRN Lavena Bullion, NP      ? Or  ? haloperidol lactate (HALDOL) injection 0.5 mg  0.5 mg Intravenous Q4H PRN Lavena Bullion, NP      ? LORazepam (ATIVAN) tablet 1 mg  1 mg Oral Q4H PRN Lavena Bullion, NP      ? Or  ? LORazepam (ATIVAN) 2 MG/ML concentrated solution 1 mg  1 mg Sublingual Q4H PRN Lavena Bullion, NP      ? Or  ? LORazepam (ATIVAN) injection 1 mg  1 mg Intravenous Q4H PRN Elie Confer B, NP      ? ondansetron (ZOFRAN-ODT) disintegrating tablet 4 mg  4 mg Oral Q6H PRN Lavena Bullion, NP      ? Or  ? ondansetron (ZOFRAN) injection 4 mg  4 mg Intravenous Q6H PRN Lavena Bullion, NP      ? polyvinyl alcohol (LIQUIFILM TEARS) 1.4 % ophthalmic solution 1 drop  1 drop Both Eyes QID PRN Lavena Bullion, NP      ? ? ? ?Discharge Medications: ?Please see discharge summary for a list of discharge medications. ? ?Relevant Imaging Results: ? ?Relevant Lab Results: ? ? ?Additional Information ?SS# [id number] ? ?Paulene Floor Bernadene Garside, LCSWA ? ? ? ? ?

## 2021-12-28 NOTE — Progress Notes (Signed)
Nutrition Brief Note ° °Chart reviewed. °Pt now transitioning to comfort care.  °No further nutrition interventions planned at this time.  °Please re-consult as needed.  ° ° °Zahid Carneiro A., MS, RD, LDN (she/her/hers) °RD pager number and weekend/on-call pager number located in Amion. ° ° °

## 2021-12-28 NOTE — Progress Notes (Signed)
Family Medicine Teaching Service ?Daily Progress Note ?Intern Pager: [phone] ? ?Patient name: Patrick [name]. Medical record number: [id number] ?Date of birth: 07/30/1932 Age: 88 y.o. Gender: male ? ?Primary Care Provider: Steele Sizer, MD ?Consultants: Nephro (s/o), palliative ?Code Status: DNR ? ?Pt Overview and Major Events to Date:  ?4/9- admitted ?4/18- made comfort care ?4/20- patient deemed not eligible for [facility]  ? ?Assessment and Plan: ?Patrick [name]. is a 89 y.o. male presenting with uncontrolled HTN, previous fall, unstable ambulation, new AKI.  Subsequently found to have spontaneous bilateral retroperitoneal bleeds. PMHx is significant for HTN, CKD, dementia, glaucoma, dyslipidemia, h/o prostate cancer, osteoporosis, protein calorie malnutrition, ACD, pulmonary HTN. Patient is now full comfort care and awaiting hospice placement.  ? ?Comfort Care ?Patient remains comfortable and in good spirits. Laughing this morning. Lost IV ON, did not require any PRN meds. Was deemed not a candidate for [facility]. Suspect the next best option for him would be SNF with hospice. ?- TOC following for placement ?- Palliative following ?- Comfort care: Haldol, Atival, Robinul, Fentanyl, Zofran. Can re-place IV or prescribe alternative formulations as needed ? ?FEN/GI: Regular ?PPx: None- comfort ?Dispo: SNF with hospice   pending bed offer .  ? ?Subjective:  ?Patrick [name] is in good spirits this morning.  He has no complaints.  Denies any pain.  Is interested in having breakfast. Responds "Oh yeah" when asked if he hungry.  ? ?Objective: ?  ?Physical Exam: ?General: Frail, in good spirits, NAD ?HEENT: Marked conjunctival pallor ?Respiratory: Normal WOB on RA ?Abdomen: Non-tender, non-distended ? ?Laboratory: ? ?No new labs ? ?Imaging/Diagnostic Tests: ?No new imaging ? ?Eppie Gibson, MD ?12/28/2021, 8:52 AM ?PGY-1, [place] [facility] ?[place] Intern pager: [phone], text pages welcome ? ?

## 2021-12-28 NOTE — TOC Progression Note (Addendum)
Transition of Care (TOC) - Initial/Assessment Note  ? ? ?Patient Details  ?Name: Patrick [name]. ?MRN: [id number] ?Date of Birth: 1931-10-28 ? ?Transition of Care (TOC) CM/SW Contact:    ?Paulene Floor Stephany Poorman, LCSWA ?Phone Number: ?12/28/2021, 10:33 AM ? ?Clinical Narrative:                 ?CSW updated FL2 to show that the patient will need SNF with hospice and is awaiting bed offers.  ? ?CSW also contacted financial counseling to inquire about their ability to assist with screening the patient for Medicaid as CSW was informed that the patient would need Medicaid or pay out of pocket for hospice at a SNF.   ? ?Expected Discharge Plan: [place] ?Barriers to Discharge: Ship broker, SNF Pending bed offer ? ? ?Patient Goals and CMS Choice ?  ?CMS Medicare.gov Compare Post Acute Care list provided to:: Patient Represenative (must comment) ?Choice offered to / list presented to : Spouse ? ?Expected Discharge Plan and Services ?Expected Discharge Plan: [place] ?  ?  ?  ?Living arrangements for the past 2 months: Apartment ?                ?  ?  ?  ?  ?  ?  ?  ?  ?  ?  ? ?Prior Living Arrangements/Services ?Living arrangements for the past 2 months: Apartment ?Lives with:: Spouse ?Patient language and need for interpreter reviewed:: Yes ?       ?  ?Care giver support system in place?: Yes (comment) ?  ?Criminal Activity/Legal Involvement Pertinent to Current Situation/Hospitalization: No - Comment as needed ? ?Activities of Daily Living ?Home Assistive Devices/Equipment: None ?ADL Screening (condition at time of admission) ?Patient's cognitive ability adequate to safely complete daily activities?: No ?Is the patient deaf or have difficulty hearing?: No ?Does the patient have difficulty seeing, even when wearing glasses/contacts?: No ?Does the patient have difficulty concentrating, remembering, or making decisions?: Yes ?Patient able to express need for assistance with ADLs?:  Yes ?Does the patient have difficulty dressing or bathing?: Yes ?Independently performs ADLs?: No ?Communication: Independent ?Dressing (OT): Needs assistance ?Is this a change from baseline?: Pre-admission baseline ?Feeding: Needs assistance ?Is this a change from baseline?: Pre-admission baseline ?Toileting: Needs assistance ?Does the patient have difficulty walking or climbing stairs?: Yes ?Weakness of Legs: Both ?Weakness of Arms/Hands: Both ? ?Permission Sought/Granted ?  ?Permission granted to share information with : Yes, Verbal Permission Granted ?   ? Permission granted to share info w AGENCY: SNF per spouse ?   ?   ? ?Emotional Assessment ?  ?  ?  ?Orientation: : Oriented to Self ?Alcohol / Substance Use: Not Applicable ?Psych Involvement: No (comment) ? ?Admission diagnosis:  HTN (hypertension) [I10] ?Hypertensive emergency [I16.1] ?Acute renal failure superimposed on stage 5 chronic kidney disease, not on chronic dialysis, unspecified acute renal failure type ([place]) [N17.9, N18.5] ?Chronic kidney disease [N18.9] ?Patient Active Problem List  ? Diagnosis Date Noted  ? Pressure injury of skin 12/25/2021  ? Chronic kidney disease 12/24/2021  ? Acute renal failure superimposed on stage 5 chronic kidney disease, not on chronic dialysis ([place])   ? Protein-calorie malnutrition, severe 12/18/2021  ? Acute kidney insufficiency 12/18/2021  ? HTN (hypertension) 12/16/2021  ? Unstable angina pectoris ([place]) 07/14/2020  ? Mild protein-calorie malnutrition ([place]) 07/14/2020  ? Mild major depression ([place]) 01/10/2020  ? CKD (chronic kidney disease) stage 3, GFR 30-59 ml/min (HCC) 04/05/2019  ?  Metabolic acidosis, normal anion gap (NAG) 07/27/2018  ? Dementia without behavioral disturbance ([place]) 07/27/2018  ? Glaucoma 07/27/2018  ? Secondary hyperparathyroidism ([place]) 11/26/2016  ? Elevated prostate specific antigen (PSA) 09/08/2015  ? Bone pain 06/13/2015  ? Spinal stenosis 06/13/2015  ? OP (osteoporosis) 03/20/2015  ?  Allergic rhinitis 03/20/2015  ? Lumbar canal stenosis 03/20/2015  ? Vitamin D deficiency 03/20/2015  ? Restless legs syndrome 03/20/2015  ? Drug-induced gynecomastia 03/20/2015  ? Diaphragmatic hernia 03/20/2015  ? Acquired cyst of kidney 03/20/2015  ? Left ventricular hypertrophy by electrocardiogram 03/20/2015  ? Leg pain 03/20/2015  ? History of pneumonia 03/20/2015  ? Non-rheumatic tricuspid valve insufficiency 03/20/2015  ? Dysmetabolic syndrome 33/00/7622  ? Pulmonary hypertension ([place]) 10/31/2014  ? Benign essential HTN 07/29/2014  ? Dyslipidemia 07/29/2014  ? PVD (peripheral vascular disease) ([place])   ? Acute anemia   ? IBS (irritable bowel syndrome)   ? Bradycardia   ? ED (erectile dysfunction) of organic origin 06/29/2012  ? Genuine stress incontinence, male 06/29/2012  ? Malignant neoplasm of prostate ([place]) 06/29/2012  ? ?PCP:  Steele Sizer, MD ?Pharmacy:   ?[facility] [place] [address] ?[place] ?[place] [address] ?Phone: [phone] Fax: [phone] ? ?[facility] - [place], [place] - [address] ?[address] ?[place] [address] ?Phone: [phone] Fax: [phone] ? ? ? ? ?Social Determinants of Health (SDOH) Interventions ?  ? ?Readmission Risk Interventions ?   ? View : No data to display.  ?  ?  ?  ? ? ? ?

## 2021-12-28 NOTE — Progress Notes (Signed)
?                                                                                                                                                     ?                                                   ?Daily Progress Note  ? ?Patient Name: Patrick [name].       Date: 12/28/2021 ?DOB: [birth year]  Age: 88 y.o. MRN#: [id number] ?Attending Physician: Kinnie Feil, MD ?Primary Care Physician: Steele Sizer, MD ?Admit Date: 12/16/2021 ? ?Reason for Consultation/Follow-up: Non pain symptom management, Pain control, Psychosocial/spiritual support, and Terminal Care ? ?Subjective: ?Chart review performed. Received report from primary RN - no acute concerns. ? ?Went to visit patient at bedside - no family/visitors present. Patient was lying in bed awake, oriented x1 to self only, able to participate in simple conversation. He is not able to make complex medical decisions. No signs or non-verbal gestures of pain or discomfort noted. No respiratory distress, increased work of breathing, or secretions noted. He denies pain - states he "feels ok." His breakfast tray is in front of him - he ate about 50%. ? ?Called and spoke with patient's wife/Patrick [name] - emotional support provided. Therapeutic listening provided as she tells me about her upcoming back surgery this coming Monday 4/24. Updated on my assessment on patient as outlined above. Informed that patient was not accepted at [facility]. We reviewed other options to include referral to another residential hospice organization vs LTC with hospice vs home with hospice. Due to her back surgery, she would not be able to provide patient the care he needs at home. She also will not be able to drive and will be relying on others to visit the patient - another hospice facility would not be an option as she wouldn't be able to visit (states [facility] is 6 minutes from her house). Discussed insurance in context of LTC placement to the best of my ability - patient does not  have insurance that would cover  a LTC stay and family is unable to pay OOP. Unfortunately, there are not many other discharge options. Informed her I would discuss with TOC and they would be working with her on safe discharge plan. Reiterated that for now, patient would remain in house under comfort care. She is appreciative of the information and is open to other options if TOC is able to assist. ? ?All questions and concerns addressed. Encouraged to call with questions and/or concerns. PMT card provided. ? ?Noted patient's last creatinine was taken 12/25/21 with upward trend. Discussed case with [facility] hosp[facility] liaison/Shanita and  inquired what creatinine threshold would be for [facility] eligibility. Shanita spoke with hospice MD who informed her they are basing eligibility off urine output, not creatinine - she verified he is home hospice eligible, but not residential. I reviewed his I/O's with her - he had 600 documented for the last 24 hours. I then asked at what point would his output make him eligible and was told if his I/O's decrease and he became anuric. Prognostication based on urine output, as well as other factors, were reviewed. If urine output decreased to around 100-264m/24hours would likely meet eligibility requirements (also pending intake). ? ?Discussed case with TOC, attending team, and primary RN. [facility]ed other [facility] to include, but not limited to, anemia. Plan is to recheck lab work to get a better understanding of where he is in the big picture. Will make recommendations from there. ? ?Length of Stay: 4 ? ?Current Medications: ?Scheduled Meds:  ? ? ?Continuous Infusions: ? ? ?PRN Meds: ?acetaminophen **OR** acetaminophen, antiseptic oral rinse, fentaNYL (SUBLIMAZE) injection, glycopyrrolate **OR** glycopyrrolate **OR** glycopyrrolate, haloperidol **OR** haloperidol **OR** haloperidol lactate, LORazepam **OR** LORazepam **OR** LORazepam,  ondansetron **OR** ondansetron (ZOFRAN) IV, polyvinyl alcohol ? ?Physical Exam ?Vitals and nursing note reviewed.  ?Constitutional:   ?   General: He is not in acute distress. ?Pulmonary:  ?   Effort: No respiratory distress.  ?Skin: ?   General: Skin is warm and dry.  ?Neurological:  ?   Mental Status: He is alert. He is disoriented and confused.  ?   Motor: Weakness present.  ?Psychiatric:     ?   Attention and Perception: Attention normal.     ?   Behavior: Behavior is cooperative.     ?   Cognition and Memory: Cognition is impaired. Memory is impaired.  ?         ? ?Vital Signs: BP (!) 156/68 (BP Location: Right Arm)   Pulse (!) 55   Temp 98.2 ?F (36.8 ?C) (Oral)   Resp 18   Ht '6\' 2"'$  (1.88 m)   Wt 70.4 kg   SpO2 98%   BMI 19.93 kg/m?  ?SpO2: SpO2: 98 % ?O2 Device: O2 Device: Room Air ?O2 Flow Rate: O2 Flow Rate (L/min): 3 L/min ? ?Intake/output summary:  ?Intake/Output Summary (Last 24 hours) at 12/28/2021 0957 ?Last data filed at 12/28/2021 0200 ?Gross per 24 hour  ?Intake 654 ml  ?Output 600 ml  ?Net 54 ml  ? ?LBM: Last BM Date : 12/27/21 ?Baseline Weight: Weight: 67.6 kg ?Most recent weight: Weight: 70.4 kg ? ?     ?Palliative Assessment/Data: PPS 30% ? ? ? ? ? ?Patient Active Problem List  ? Diagnosis Date Noted  ? Pressure injury of skin 12/25/2021  ? Chronic kidney disease 12/24/2021  ? Acute renal failure superimposed on stage 5 chronic kidney disease, not on chronic dialysis ([place]   ? Protein-calorie malnutrition, severe 12/18/2021  ? Acute kidney insufficiency 12/18/2021  ? HTN (hypertension) 12/16/2021  ? Unstable angina pectoris ([place] 07/14/2020  ? Mild protein-calorie malnutrition ([place] 07/14/2020  ? Mild major depression ([place] 01/10/2020  ? CKD (chronic kidney disease) stage 3, GFR 30-59 ml/min (HCC) 04/05/2019  ? Metabolic acidosis, normal anion gap (NAG) 07/27/2018  ? Dementia without behavioral disturbance ([place] 07/27/2018  ? Glaucoma 07/27/2018  ? Secondary hyperparathyroidism ([place]  11/26/2016  ? Elevated prostate specific antigen (PSA) 09/08/2015  ? Bone pain 06/13/2015  ? Spinal stenosis 06/13/2015  ?  OP (osteoporosis) 03/20/2015  ? Allergic rhinitis 03/20/2015  ? Lumbar canal stenosis 03/20/2015  ? Vitamin D deficiency 03/20/2015  ? Restless legs syndrome 03/20/2015  ? Drug-induced gynecomastia 03/20/2015  ? Diaphragmatic hernia 03/20/2015  ? Acquired cyst of kidney 03/20/2015  ? Left ventricular hypertrophy by electrocardiogram 03/20/2015  ? Leg pain 03/20/2015  ? History of pneumonia 03/20/2015  ? Non-rheumatic tricuspid valve insufficiency 03/20/2015  ? Dysmetabolic syndrome 67/59/1638  ? Pulmonary hypertension ([place]) 10/31/2014  ? Benign essential HTN 07/29/2014  ? Dyslipidemia 07/29/2014  ? PVD (peripheral vascular disease) ([place])   ? Acute anemia   ? IBS (irritable bowel syndrome)   ? Bradycardia   ? ED (erectile dysfunction) of organic origin 06/29/2012  ? Genuine stress incontinence, male 06/29/2012  ? Malignant neoplasm of prostate ([place]) 06/29/2012  ? ? ?Palliative Care Assessment & Plan  ? ?Patient Profile: ?88 y.o. male  with past medical history of dementia, CKD stage IV, protein calorie malnutrition, history of prostate cancer, osteoporosis, hypertension, and hyperlipidemia who presented to the emergency department on 12/16/2021 with fall at home and unstable ambulation.  Found to have creatinine of 6.45.  Blood pressure in the ED 180-210/77-100.  He was admitted to [facility] with acute on chronic kidney failure and uncontrolled hypertension. ? ?Assessment: ?Principal Problem: ?  HTN (hypertension) ?Active Problems: ?  Acute anemia ?  Pulmonary hypertension ([place]) ?  Dementia without behavioral disturbance ([place]) ?  Protein-calorie malnutrition, severe ?  Acute kidney insufficiency ?  Acute renal failure superimposed on stage 5 chronic kidney disease, not on chronic dialysis ([place]) ?  Chronic kidney disease ?  Pressure injury of skin ?  Terminal care ? ?Recommendations/Plan: ?Continue full  comfort measures ?Continue DNR/DNI as previously documented - durable DNR form completed and placed in shadow chart. Copy was made and will be scanned into Vynca/ACP tab ?Patient was not approved for [facility] ?Rev

## 2021-12-29 DIAGNOSIS — I10 Essential (primary) hypertension: Secondary | ICD-10-CM | POA: Diagnosis not present

## 2021-12-29 DIAGNOSIS — F0390 Unspecified dementia without behavioral disturbance: Secondary | ICD-10-CM | POA: Diagnosis not present

## 2021-12-29 DIAGNOSIS — D649 Anemia, unspecified: Secondary | ICD-10-CM | POA: Diagnosis not present

## 2021-12-29 NOTE — Progress Notes (Signed)
?   12/29/21 0036  ?Vitals  ?Temp 98.3 ?F (36.8 ?C)  ?Temp Source Oral  ?BP (!) 181/65  ?MAP (mmHg) 99  ?BP Location Right Arm  ?BP Method Automatic  ?Patient Position (if appropriate) Lying  ?Pulse Rate (!) 59  ?Pulse Rate Source Monitor  ?Resp 17  ?Oxygen Therapy  ?SpO2 98 %  ? ? ?

## 2021-12-29 NOTE — Progress Notes (Signed)
?   12/29/21 0024  ?What Happened  ?Was fall witnessed? No  ?Was patient injured? No ?(small abrasion to right knee, pt denies pain and states he is fine)  ?Patient found on floor  ?Found by Staff-comment ?([place] NT)  ?Stated prior activity other (comment) ?(bed-comfort care)  ?Follow Up  ?MD notified Nancy Fetter MD FMTS  ?Time MD notified 66  ?Family notified Yes - comment ?(Wife- Eldred Sooy)  ?Time family notified 0010  ?Additional tests No ?(pt comfort care)  ?Simple treatment Other (comment) ?(None needed)  ?Progress note created (see row info) Yes  ?Adult Fall Risk Assessment  ?Risk Factor Category (scoring not indicated) High fall risk per protocol (document High fall risk);Fall has occurred during this admission (document High fall risk)  ?Patient Fall Risk Level High fall risk  ?Adult Fall Risk Interventions  ?Required Bundle Interventions *See Row Information* High fall risk - low, moderate, and high requirements implemented  ?Additional Interventions Use of appropriate toileting equipment (bedpan, BSC, etc.)  ?Screening for Fall Injury Risk (To be completed on HIGH fall risk patients) - Assessing Need for Floor Mats  ?Risk For Fall Injury- Criteria for Floor Mats 85 years or older;Confusion/dementia (+NuDESC, CIWA, TBI, etc.)  ?Will Implement Floor Mats Yes  ?Pain Assessment  ?Pain Scale 0-10  ?Pain Score 0  ?PCA/Epidural/Spinal Assessment  ?Respiratory Pattern Regular;Unlabored  ?Neurological  ?Neuro (WDL) X  ?Level of Consciousness Alert  ?Orientation Level Oriented to person;Oriented to place  ?Cognition Follows commands;Poor attention/concentration;Poor judgement;Poor safety awareness;Memory impairment  ?Speech Clear  ?R Pupil Size (mm) 3  ?R Pupil Shape Round  ?R Pupil Reaction Brisk  ?L Pupil Size (mm) 3  ?L Pupil Shape Round  ?L Pupil Reaction Brisk  ?Neuro Symptoms Forgetful  ?Musculoskeletal  ?Musculoskeletal (WDL) X  ?Assistive Device Front wheel walker  ?Generalized Weakness Yes  ?Weight  Bearing Restrictions No  ?Integumentary  ?Integumentary (WDL) X  ?Skin Color Appropriate for ethnicity  ?Skin Condition Dry  ?Skin Integrity Ecchymosis;Abrasion  ?Abrasion Location Knee  ?Abrasion Location Orientation Right  ?Ecchymosis Location Arm;Hip ?(already there before fall)  ?Ecchymosis Location Orientation Right  ?Skin Turgor Non-tenting  ? ? ?

## 2021-12-29 NOTE — Progress Notes (Signed)
Family Medicine Teaching Service ?Daily Progress Note ?Intern Pager: [phone] ? ?Patient name: Patrick [name]. Medical record number: [id number] ?Date of birth: 07/03/1932 Age: 88 y.o. Gender: male ? ?Primary Care Provider: Steele Sizer, MD ?Consultants: Nephrology, palliative care ?  Code Status: DNR ? ?Pt Overview and Major Events to Date:  ?4/9- admitted ?4/18- made comfort care ?4/20- patient deemed not eligible for [facility]  ? ?Assessment and Plan: ?Patrick [name]. is a 88 y.o. male who presented with frequent falls found to have AKI on CKD IV, course complicated by retroperitoneal bleed, now made full comfort care. PMHx significant for: HTN, CKD 4, dementia, glaucoma, HLD, prostate cancer, osteoporosis, protein calorie malnutrition, pulmonary HTN.  Patient is medically stable for placement. ? ?Comfort care ?Unfortunately did not qualify for inpatient hospice.  Now pursuing SNF with hospice which I believe would be the next best option for him. No prns used. ?- TOC following for placement ?- Palliative following ?- Comfort care: Haldol, Atival, Robinul, Fentanyl, Zofran. Can re-place IV or prescribe alternative formulations as needed ? ?FEN/GI: Regular ?PPx: None ? ?Disposition: SNF with hospice possibly when bed available ? ?Subjective:  ?Last night, patient apparently fell onto his right knee from the bed.  He has a superficial abrasion to the area.  No other injuries.  He had denied pain at the time. ? ?This morning patient is sleeping comfortably. ? ?Objective: ?Temp:  [98.3 ?F (36.8 ?C)] 98.3 ?F (36.8 ?C) (04/22 0036) ?Pulse Rate:  [59] 59 (04/22 0036) ?Resp:  [17] 17 (04/22 0036) ?BP: (181)/(65) 181/65 (04/22 0036) ?SpO2:  [98 %] 98 % (04/22 0036) ?Physical Exam: ?General: asleep, NAD ?Cardiovascular: bradycardic ?Respiratory: CTAB, no respiratory distress ?Abdomen: soft, non-tender ?Extremities: WWP ? ?Laboratory: ?Recent Labs  ?Lab 12/24/21 ?2023 12/24/21 ?2108 12/25/21 ?4166  12/28/21 ?1453  ?WBC 5.3  --  5.0 5.3  ?HGB 6.0* 6.3* 6.5* 8.4*  ?HCT 17.9* 19.1* 19.5* 24.7*  ?PLT 100*  --  95* 132*  ? ?Recent Labs  ?Lab 12/24/21 ?0630 12/24/21 ?2023 12/25/21 ?0542  ?NA 136 135 136  ?K 6.0* 6.2* 5.1  ?CL 106 103 105  ?CO2 21* 20* 22  ?BUN 99* 108* 115*  ?CREATININE 6.63* 7.03* 6.98*  ?CALCIUM 10.0 9.5 9.1  ?BILITOT  --   --  0.6  ?GLUCOSE 104* 122* 101*  ? ? ? ? ?Imaging/Diagnostic Tests: ?No results found.  ? ?Zola Button, MD ?12/29/2021, 6:02 AM ?PGY-2, [place] ?[place] Intern pager: [phone], text pages welcome  ?

## 2021-12-29 NOTE — Progress Notes (Signed)
FPTS Brief Note ?Reviewed patient's vitals, recent notes.  ?Vitals:  ? 12/29/21 0903 12/29/21 1618  ?BP: 139/60 (!) 183/70  ?Pulse: (!) 55 (!) 58  ?Resp: 15 18  ?Temp: 98.1 ?F (36.7 ?C) 98.1 ?F (36.7 ?C)  ?SpO2: 98% 99%  ? ?Patient with no complaints.  At this time, no change in plan from day progress note.  ? ? ?[name], DO ?Page [phone] with questions about this patient.  ? ? ? ?

## 2021-12-29 NOTE — Progress Notes (Signed)
FPTS Brief Note ?Reviewed patient's vitals, recent notes.  ?Vitals:  ? 12/26/21 1605 12/27/21 0428  ?BP: (!) 123/48 (!) 156/68  ?Pulse: 63 (!) 55  ?Resp: 15 18  ?Temp: 97.8 ?F (36.6 ?C) 98.2 ?F (36.8 ?C)  ?SpO2: 99% 98%  ?Fell on right knee, denies pain, did not hit head  ?No new imaging as patient is hospice care  ?Wife updated by nursing  ?At this time, no change in plan from day progress note.  ?Erskine Emery, MD ?Page [phone] with questions about this patient.  ? ? ?

## 2021-12-29 NOTE — Progress Notes (Signed)
Family Medicine Teaching Service ?Daily Progress Note ?Intern Pager: [phone] ? ?Patient name: Patrick [name]. Medical record number: [id number] ?Date of birth: January 02, 1932 Age: 88 y.o. Gender: male ? ?Primary Care Provider: Steele Sizer, MD ?Consultants: Nephrology, palliative care ?Code Status: DNR ? ?Pt Overview and Major Events to Date:  ?4/9-admitted ?4/18-made comfort care ?4/20-patient deemed not eligible for [facility] ? ?Assessment and Plan: ?[name]. is a 88 y.o. male who presented with frequent falls found to have AKI on CKD IV, course complicated by retroperitoneal bleed, now made full comfort care. PMHx significant for: HTN, CKD 4, dementia, glaucoma, HLD, prostate cancer, osteoporosis, protein calorie malnutrition, pulmonary HTN.  Patient is medically stable for placement. ? ?Comfort care ?Patient did not qualify for inpatient hospice at [facility].  Currently pursuing SNF with hospice. ?-TOC follow-up for placement ?-Palliative following, appreciate their assistance ?-Continue comfort care, comfort care medications ? ?FEN/GI: Regular ?PPx: None ?Dispo: SNF with hospice, barriers include awaiting placement ? ?Subjective:  ?Patient with no complaints this morning.  Resting comfortably in bed.  Very pleasant on exam. ? ?Objective: ?Temp:  [98.1 ?F (36.7 ?C)-98.3 ?F (36.8 ?C)] 98.1 ?F (36.7 ?C) (04/22 1618) ?Pulse Rate:  [55-59] 58 (04/22 1618) ?Resp:  [15-18] 18 (04/22 1618) ?BP: (139-183)/(60-70) 183/70 (04/22 1618) ?SpO2:  [98 %-99 %] 99 % (04/22 1618) ?Physical Exam: ?General: Elderly male lying in bed in no distress ?Cardiovascular: RRR ?Respiratory: CTA ?Abdomen: No abdominal pain to palpation ? ?Laboratory: ?Recent Labs  ?Lab 12/24/21 ?2023 12/24/21 ?2108 12/25/21 ?8889 12/28/21 ?1453  ?WBC 5.3  --  5.0 5.3  ?HGB 6.0* 6.3* 6.5* 8.4*  ?HCT 17.9* 19.1* 19.5* 24.7*  ?PLT 100*  --  95* 132*  ? ?Recent Labs  ?Lab 12/24/21 ?1694 12/24/21 ?2023 12/25/21 ?0542  ?NA 136 135 136  ?K  6.0* 6.2* 5.1  ?CL 106 103 105  ?CO2 21* 20* 22  ?BUN 99* 108* 115*  ?CREATININE 6.63* 7.03* 6.98*  ?CALCIUM 10.0 9.5 9.1  ?BILITOT  --   --  0.6  ?GLUCOSE 104* 122* 101*  ? ? ?[name], DO ?12/29/2021, 11:52 PM ?PGY-3, [place] ?[place] Intern pager: [phone], text pages welcome  ?

## 2021-12-29 NOTE — Progress Notes (Signed)
Manufacturing engineer [facility]) Hospital Liaison Note ? ?ACC continues to follow for discharge disposition. Please call with any questions or concerns. Thank you ? ? ?Roselee Nova, LCSW ?[place] [facility] Liaison ?[phone] ?

## 2021-12-30 DIAGNOSIS — R58 Hemorrhage, not elsewhere classified: Secondary | ICD-10-CM | POA: Diagnosis not present

## 2021-12-30 DIAGNOSIS — I10 Essential (primary) hypertension: Secondary | ICD-10-CM | POA: Diagnosis not present

## 2021-12-30 NOTE — Progress Notes (Signed)
Pt was just trying to get OOB d/t having a BM. Pt also took male purewick off and squeezed all of the urine out of it into the lid to his tray and sat it by the bedside. This RN gave pt a bath and changed all linen. Pt stated he felt better and was going to go back to sleep. RN will continue to monitor pt.  ? ?Foster Simpson Jeanpierre Thebeau ? ?

## 2021-12-31 DIAGNOSIS — I10 Essential (primary) hypertension: Secondary | ICD-10-CM | POA: Diagnosis not present

## 2021-12-31 DIAGNOSIS — N179 Acute kidney failure, unspecified: Secondary | ICD-10-CM | POA: Diagnosis not present

## 2021-12-31 DIAGNOSIS — F0390 Unspecified dementia without behavioral disturbance: Secondary | ICD-10-CM | POA: Diagnosis not present

## 2021-12-31 DIAGNOSIS — Z515 Encounter for palliative care: Secondary | ICD-10-CM | POA: Diagnosis not present

## 2021-12-31 NOTE — Progress Notes (Signed)
FPTS Brief Note ?Reviewed patient's vitals, recent notes.  ?Vitals:  ? 12/30/21 0900 12/30/21 2109  ?BP: (!) 175/71 (!) 193/75  ?Pulse: 60 (!) 58  ?Resp: 18 18  ?Temp: 98 ?F (36.7 ?C) 98.9 ?F (37.2 ?C)  ?SpO2: 97% 97%  ? ?At this time, no change in plan from day progress note. Awaiting SNF. Full comfort care at this time. ? ?France Ravens, MD ?Page [phone] with questions about this patient.  ? ?

## 2021-12-31 NOTE — TOC Progression Note (Signed)
Transition of Care (TOC) - Initial/Assessment Note  ? ? ?Patient Details  ?Name: Patrick [name]. ?MRN: [id number] ?Date of Birth: 1932-05-02 ? ?Transition of Care (TOC) CM/SW Contact:    ?Paulene Floor Adaja Wander, LCSWA ?Phone Number: ?12/31/2021, 1:13 PM ? ?Clinical Narrative:                 ?TOC following patient for planning needs medically stable.  No facility has accepted the patient for SNF with hospice.  TOC leadership notified.   ? ?Lind Covert, MSW, LCSWA  ? ?Expected Discharge Plan: [address] ?Barriers to Discharge: Ship broker, SNF Pending bed offer ? ? ?Patient Goals and CMS Choice ?  ?CMS Medicare.gov Compare Post Acute Care list provided to:: Patient Represenative (must comment) ?Choice offered to / list presented to : Spouse ? ?Expected Discharge Plan and Services ?Expected Discharge Plan: [place] ?  ?  ?  ?Living arrangements for the past 2 months: Apartment ?                ?  ?  ?  ?  ?  ?  ?  ?  ?  ?  ? ?Prior Living Arrangements/Services ?Living arrangements for the past 2 months: Apartment ?Lives with:: Spouse ?Patient language and need for interpreter reviewed:: Yes ?       ?  ?Care giver support system in place?: Yes (comment) ?  ?Criminal Activity/Legal Involvement Pertinent to Current Situation/Hospitalization: No - Comment as needed ? ?Activities of Daily Living ?Home Assistive Devices/Equipment: None ?ADL Screening (condition at time of admission) ?Patient's cognitive ability adequate to safely complete daily activities?: No ?Is the patient deaf or have difficulty hearing?: No ?Does the patient have difficulty seeing, even when wearing glasses/contacts?: No ?Does the patient have difficulty concentrating, remembering, or making decisions?: Yes ?Patient able to express need for assistance with ADLs?: Yes ?Does the patient have difficulty dressing or bathing?: Yes ?Independently performs ADLs?: No ?Communication: Independent ?Dressing (OT): Needs  assistance ?Is this a change from baseline?: Pre-admission baseline ?Feeding: Needs assistance ?Is this a change from baseline?: Pre-admission baseline ?Toileting: Needs assistance ?Does the patient have difficulty walking or climbing stairs?: Yes ?Weakness of Legs: Both ?Weakness of Arms/Hands: Both ? ?Permission Sought/Granted ?  ?Permission granted to share information with : Yes, Verbal Permission Granted ?   ? Permission granted to share info w AGENCY: SNF per spouse ?   ?   ? ?Emotional Assessment ?  ?  ?  ?Orientation: : Oriented to Self ?Alcohol / Substance Use: Not Applicable ?Psych Involvement: No (comment) ? ?Admission diagnosis:  HTN (hypertension) [I10] ?Hypertensive emergency [I16.1] ?Acute renal failure superimposed on stage 5 chronic kidney disease, not on chronic dialysis, unspecified acute renal failure type ([place]) [N17.9, N18.5] ?Chronic kidney disease [N18.9] ?Patient Active Problem List  ? Diagnosis Date Noted  ? Retroperitoneal bleed   ? Pressure injury of skin 12/25/2021  ? Chronic kidney disease 12/24/2021  ? Acute renal failure superimposed on stage 5 chronic kidney disease, not on chronic dialysis ([place])   ? Protein-calorie malnutrition, severe 12/18/2021  ? Acute kidney insufficiency 12/18/2021  ? HTN (hypertension) 12/16/2021  ? Unstable angina pectoris ([place]) 07/14/2020  ? Mild protein-calorie malnutrition ([place]) 07/14/2020  ? Mild major depression ([place]) 01/10/2020  ? CKD (chronic kidney disease) stage 3, GFR 30-59 ml/min (HCC) 04/05/2019  ? Metabolic acidosis, normal anion gap (NAG) 07/27/2018  ? Dementia without behavioral disturbance ([place]) 07/27/2018  ? Glaucoma 07/27/2018  ? Secondary hyperparathyroidism (  [place]) 11/26/2016  ? Elevated prostate specific antigen (PSA) 09/08/2015  ? Bone pain 06/13/2015  ? Spinal stenosis 06/13/2015  ? OP (osteoporosis) 03/20/2015  ? Allergic rhinitis 03/20/2015  ? Lumbar canal stenosis 03/20/2015  ? Vitamin D deficiency 03/20/2015  ? Restless legs syndrome  03/20/2015  ? Drug-induced gynecomastia 03/20/2015  ? Diaphragmatic hernia 03/20/2015  ? Acquired cyst of kidney 03/20/2015  ? Left ventricular hypertrophy by electrocardiogram 03/20/2015  ? Leg pain 03/20/2015  ? History of pneumonia 03/20/2015  ? Non-rheumatic tricuspid valve insufficiency 03/20/2015  ? Dysmetabolic syndrome 94/58/5929  ? Pulmonary hypertension ([place]) 10/31/2014  ? Benign essential HTN 07/29/2014  ? Dyslipidemia 07/29/2014  ? PVD (peripheral vascular disease) ([place])   ? Acute anemia   ? IBS (irritable bowel syndrome)   ? Bradycardia   ? ED (erectile dysfunction) of organic origin 06/29/2012  ? Genuine stress incontinence, male 06/29/2012  ? Malignant neoplasm of prostate ([place]) 06/29/2012  ? ?PCP:  Steele Sizer, MD ?Pharmacy:   ?[facility] [place] [address] [place] ?[place] ?[place] [address] ?Phone: [phone] Fax: [phone] ? ?[facility] - [place], [place] - [address] ?[address] ?[place] [address] ?Phone: [phone] Fax: [phone] ? ? ? ? ?Social Determinants of Health (SDOH) Interventions ?  ? ?Readmission Risk Interventions ?   ? View : No data to display.  ?  ?  ?  ? ? ? ?

## 2021-12-31 NOTE — Consult Note (Signed)
? ?  [facility] CM Inpatient Consult ? ? ?12/31/2021 ? ?[name]. ?12-30-1931 ?[id number] ? ?[place] [facility] [[facility]O] Patient: Patrick [name] ? ?Primary Care Provider:  Steele Sizer, MD, [facility], is an embedded provider with a Chronic Care Management team and program, and is listed for the transition of care follow up and appointments. ? ?Patient was screened for length of stay and for  Embedded practice service needs for chronic care management and found that the patient is currently in comfort care measure and being recommended for a hospice SNF as reviewed in the Palliative consult and inpatient [facility] team progress notes. ? ?Plan: Currently, patient on comfort measures, follow disposition. ? ?Please contact for further questions, ? ?Natividad Brood, RN BSN CCM ?[place] [facility] Liaison ? [phone] business mobile phone ?Toll free office [phone]  ?Fax number: [phone] ?[email or web address] ? ? ? ?

## 2021-12-31 NOTE — Progress Notes (Signed)
Manufacturing engineer [facility]) Hospital Liaison Note ?  ?ACC continues to follow for discharge disposition.  ? ?Please call with any questions or concerns.  ? ?Thank you ?  ?Gar Ponto, RN ?[facility] Liaison ?[phone] ?

## 2021-12-31 NOTE — Progress Notes (Signed)
Family Medicine Teaching Service ?Daily Progress Note ?Intern Pager: [phone] ? ?Patient name: Patrick [name]. Medical record number: [id number] ?Date of birth: [birth year] Age: 88 y.o. Gender: male ? ?Primary Care Provider: Steele Sizer, MD ?Consultants: Nephro, palliative ?Code Status: DNR ? ?Pt Overview and Major Events to Date:  ?4/9-admitted ?4/18-made comfort care ?4/20-patient deemed not eligible for [facility] ? ?Assessment and Plan: ?Patrick [name]. is a 88 y.o. male who presented with frequent falls found to have AKI on CKD IV, course complicated by retroperitoneal bleed, now made full comfort care. PMHx significant for: HTN, CKD 4, dementia, glaucoma, HLD, prostate cancer, osteoporosis, protein calorie malnutrition, pulmonary HTN.  Patient is medically stable for placement. ? ?Comfort Care ?Not a [employer] candidate. Wife cannot care for at home. Will need SNF with hospice. TOC assisting. Has not needed any comfort meds. ?- TOC following, appreciate their hard work ?- Palliative following, appreciate their assistance ?- Continue full comfort measures: Haldol, ativan, robinul, fentanyl, zofran ? ?FEN/GI: Regular ?PPx: None-comfort ?Dispo: SNF with hospice   Barriers include placement.  ? ?Subjective:  ?Patrick [name] remains in good spirits.  Breakfast during our interview.  Has no complaints. ? ?Objective: ?Temp:  [98 ?F (36.7 ?C)-98.9 ?F (37.2 ?C)] 98.9 ?F (37.2 ?C) (04/23 2109) ?Pulse Rate:  [58-60] 58 (04/23 2109) ?Resp:  [18] 18 (04/23 2109) ?BP: (175-193)/(71-75) 193/75 (04/23 2109) ?SpO2:  [97 %] 97 % (04/23 2109) ?Physical Exam: ?General: Frail but in good spirits, NAD ?Cardiovascular: RRR, no murmur, improved from previous ?Respiratory: Normal WOB on RA ?Abdomen: Non-tender, non-distended ?Extremities: Pedal edema ? ?Laboratory: ?No new labs ? ?Imaging/Diagnostic Tests: ?No new imaging ? ?Patrick Gibson, MD ?12/31/2021, 8:57 AM ?PGY-1, [facility] ?[place] Intern pager:  [phone], text pages welcome ? ?

## 2021-12-31 NOTE — Progress Notes (Addendum)
FPTS Brief Note ?Reviewed patient's vitals, recent notes.  ?Vitals:  ? 12/31/21 1002 12/31/21 2304  ?BP: (!) 201/68 (!) 211/84  ?Pulse: (!) 53 71  ?Resp: 15 18  ?Temp: 97.6 ?F (36.4 ?C) 98 ?F (36.7 ?C)  ?SpO2: 99% 100%  ? ?At this time, no change in plan from day progress note.  ?Comfort care. Awaiting SNF  ? ? ?France Ravens, MD ?Page [phone] with questions about this patient.  ? ?

## 2021-12-31 NOTE — Progress Notes (Signed)
Patient Patrick [name].      DOB: Jun 16, 1932      XBJ:[id number] ? ? ? ?  ?Palliative Medicine Team ? ? ? ?Subjective: Bedside symptom check completed. No family or visitors present at time of visit.  ? ? ?Physical exam: Patient resting in bed with eyes closed at time of visit. Breathing even and non-labored, no excessive secretions noted. Patient without physical or non-verbal signs of pain or discomfort at this time. This RN did not attempt to wake the patient, to ensure comfort is preserved. ? ? ?Assessment and plan: Bedside RN without any needs or concerns this morning. Patient appears very comfortable, PRN medications available should they be needed. Will continue to follow for any changes or advances.  ? ? ?Thank you for allowing the Palliative Medicine Team to assist in the care of this patient. ?  ?  ?Damian Leavell, MSN, RN ?Palliative Medicine Team ?Team Phone: [phone]  ?This phone is monitored 7a-7p, please reach out to attending physician outside of these hours for urgent needs.   ?

## 2022-01-01 NOTE — Progress Notes (Signed)
Family Medicine Teaching Service ?Daily Progress Note ?Intern Pager: [phone] ? ?Patient name: Patrick [name]. Medical record number: [id number] ?Date of birth: 08-Aug-1932 Age: 88 y.o. Gender: male ? ?Primary Care Provider: Steele Sizer, MD ?Consultants: Nephro, palliative ?Code Status: DNR ? ?Pt Overview and Major Events to Date:  ?4/9-admitted ?4/18-made comfort care ?4/20-patient deemed not eligible for [facility] ? ?Assessment and Plan: ?[name]. is a 88 y.o. male who presented with frequent falls found to have AKI on CKD IV, course complicated by retroperitoneal bleed, now made full comfort care. PMHx significant for: HTN, CKD 4, dementia, glaucoma, HLD, prostate cancer, osteoporosis, protein calorie malnutrition, pulmonary HTN.  Patient is medically stable for placement. ? ?Comfort Care ?Not eligible for [facility]. Wife recently had back surgery and cannot care for him at home. Awaiting SNF placement with hospice. Got sublingual ativan x1 last night. ?- TOC following for placement, appreciate their assistance ?- Palliative following for symptom control ?- Continue comfort measures: Haldol, ativan, robinul, fentanyl, Zofran ? ?FEN/GI: Regular ?PPx: none, comfort ?Dispo:SNF with hospice pending placement ? ?Subjective:  ?Mr. Duchemin was sleeping upon arrival, awakens to voice. Does seem a bit weaker today compared to my previous exams. ? ?Objective: ?Temp:  [97.3 ?F (36.3 ?C)-98 ?F (36.7 ?C)] 97.3 ?F (36.3 ?C) (04/25 3220) ?Pulse Rate:  [53-71] 53 (04/25 0824) ?Resp:  [12-18] 12 (04/25 0824) ?BP: (176-211)/(68-84) 176/73 (04/25 2542) ?SpO2:  [98 %-100 %] 98 % (04/25 0824) ?Physical Exam: ?General: Sleeping on arrival, awakens to voice, pleasant but weak ?Cardiovascular: RRR, 2/6 systolic murmur ?Respiratory: Normal WOB on RA, lungs clear ?Abdomen: Soft and non-tender, non-distended ?Extremities: Without edema or deformity ? ?Laboratory: ?No new labs.  ? ?Imaging/Diagnostic Tests: ?No new  imaging. ? ?Eppie Gibson, MD ?01/01/2022, 9:05 AM ?PGY-1, [place] ?[place] Intern pager: [phone], text pages welcome ? ?

## 2022-01-01 NOTE — Progress Notes (Signed)
FPTS Brief Note ?Reviewed patient's vitals, recent notes.  ?Vitals:  ? 01/01/22 0824 01/01/22 1630  ?BP: (!) 176/73 (!) 219/185  ?Pulse: (!) 53 (!) 59  ?Resp: 12 16  ?Temp: (!) 97.3 ?F (36.3 ?C)   ?SpO2: 98% 99%  ? ?At this time, no change in plan from day progress note.  ?France Ravens, MD ?Page [phone] with questions about this patient.  ? ?

## 2022-01-02 ENCOUNTER — Encounter (HOSPITAL_COMMUNITY): Payer: Self-pay | Admitting: Family Medicine

## 2022-01-02 DIAGNOSIS — E43 Unspecified severe protein-calorie malnutrition: Secondary | ICD-10-CM | POA: Diagnosis not present

## 2022-01-02 DIAGNOSIS — Z789 Other specified health status: Secondary | ICD-10-CM

## 2022-01-02 DIAGNOSIS — I161 Hypertensive emergency: Secondary | ICD-10-CM | POA: Diagnosis not present

## 2022-01-02 DIAGNOSIS — Z515 Encounter for palliative care: Secondary | ICD-10-CM | POA: Diagnosis not present

## 2022-01-02 DIAGNOSIS — F0390 Unspecified dementia without behavioral disturbance: Secondary | ICD-10-CM | POA: Diagnosis not present

## 2022-01-02 DIAGNOSIS — N179 Acute kidney failure, unspecified: Secondary | ICD-10-CM | POA: Diagnosis not present

## 2022-01-02 NOTE — Progress Notes (Signed)
Family Medicine Teaching Service ?Daily Progress Note ?Intern Pager: [phone] ? ?Patient name: Patrick [name]. Medical record number: [id number] ?Date of birth: September 07, 1932 Age: 88 y.o. Gender: male ? ?Primary Care Provider: Steele Sizer, MD ?Consultants: Nephro, Palliative ?Code Status: DNR ? ?Pt Overview and Major Events to Date:  ?4/9-admitted ?4/18-made comfort care ?4/20-patient deemed not eligible for [facility] ? ?Assessment and Plan: ?Patrick P Ayaz Sondgeroth. is a 88 y.o. male who presented with frequent falls found to have AKI on CKD IV, course complicated by retroperitoneal bleed, now made full comfort care. PMHx significant for: HTN, CKD 4, dementia, glaucoma, HLD, prostate cancer, osteoporosis, protein calorie malnutrition, pulmonary HTN.  Patient is medically stable for placement. ? ?Comfort Care ?Not eligible for [facility]. Continues to await placement. Remains comfortable. No need for comfort meds last night. ?- TOC assisting with placement ?- Palliative following for symptom management ?- Comfort measures: Haldol, ativan, robinul, fentanyl, Zofran ? ?FEN/GI: Regular ?PPx: None, comfort ?Dispo:SNF  with hospice pending placement .  ? ?Subjective:  ?Patient sitting up eating breakfast, no complaints this morning.  ? ?Objective: ?Pulse Rate:  [59] 59 (04/25 1630) ?Resp:  [16] 16 (04/25 1630) ?BP: (219)/(185) 219/185 (04/25 1630) ?SpO2:  [99 %] 99 % (04/25 1630) ?Physical Exam: ?General: Sitting up eating breakfast, NAD ?Cardiovascular: Regular rate and rhythm, 2/6 systolic murmur ?Respiratory: Normal work of breathing on room air ?Abdomen: Nontender nondistended ?Extremities: Bilateral pedal edema, stable ? ?Laboratory: ?No new labs ?Imaging/Diagnostic Tests: ?No new imaging, tests ? ?Eppie Gibson, MD ?01/02/2022, 9:12 AM ?PGY-1, [place] [facility] ?[place] Intern pager: [phone], text pages welcome ? ?

## 2022-01-02 NOTE — TOC Progression Note (Signed)
Transition of Care (TOC) - Initial/Assessment Note  ? ? ?Patient Details  ?Name: Patrick [name]. ?MRN: [id number] ?Date of Birth: 04-28-1932 ? ?Transition of Care (TOC) CM/SW Contact:    ?Paulene Floor Shyla Gayheart, LCSWA ?Phone Number: ?01/02/2022, 11:53 AM ? ?Clinical Narrative:                 ?CSW contacted the patient's wife.  The spouse had back surgery on Monday 4/24 and was just released from the hospital yesterday.  The spouse is staying with her sister as the spouse cannot do anything for herself at this time.  CSW spoke with the patient's spouse about d/c planning.  The spouse is not agreeable to CSW sending the patient's referral to a facility in [facility] or [place].  The wife is open to facilities in [place], [place], or [place] only.  CSW informed the spouse of barriers to placement, patient not having Medicaid, and again explained that there will be an out of pocket expense for services in a LTC facility.  The spouse reported that she does not have the money to pay out of pocket and will apply for Medicaid next week.   ? ?CSW contacted financial counseling to follow up on CSW"s previous inquiry about their ability to assist with the Medicaid process and is awaiting a response.   ? ?Expected Discharge Plan: [place] ?Barriers to Discharge: Ship broker, SNF Pending bed offer ? ? ?Patient Goals and CMS Choice ?  ?CMS Medicare.gov Compare Post Acute Care list provided to:: Patient Represenative (must comment) ?Choice offered to / list presented to : Spouse ? ?Expected Discharge Plan and Services ?Expected Discharge Plan: [place] ?  ?  ?  ?Living arrangements for the past 2 months: Apartment ?                ?  ?  ?  ?  ?  ?  ?  ?  ?  ?  ? ?Prior Living Arrangements/Services ?Living arrangements for the past 2 months: Apartment ?Lives with:: Spouse ?Patient language and need for interpreter reviewed:: Yes ?       ?  ?Care giver support system in  place?: Yes (comment) ?  ?Criminal Activity/Legal Involvement Pertinent to Current Situation/Hospitalization: No - Comment as needed ? ?Activities of Daily Living ?Home Assistive Devices/Equipment: None ?ADL Screening (condition at time of admission) ?Patient's cognitive ability adequate to safely complete daily activities?: No ?Is the patient deaf or have difficulty hearing?: No ?Does the patient have difficulty seeing, even when wearing glasses/contacts?: No ?Does the patient have difficulty concentrating, remembering, or making decisions?: Yes ?Patient able to express need for assistance with ADLs?: Yes ?Does the patient have difficulty dressing or bathing?: Yes ?Independently performs ADLs?: No ?Communication: Independent ?Dressing (OT): Needs assistance ?Is this a change from baseline?: Pre-admission baseline ?Feeding: Needs assistance ?Is this a change from baseline?: Pre-admission baseline ?Toileting: Needs assistance ?Does the patient have difficulty walking or climbing stairs?: Yes ?Weakness of Legs: Both ?Weakness of Arms/Hands: Both ? ?Permission Sought/Granted ?  ?Permission granted to share information with : Yes, Verbal Permission Granted ?   ? Permission granted to share info w AGENCY: SNF per spouse ?   ?   ? ?Emotional Assessment ?  ?  ?  ?Orientation: : Oriented to Self ?Alcohol / Substance Use: Not Applicable ?Psych Involvement: No (comment) ? ?Admission diagnosis:  HTN (hypertension) [I10] ?Hypertensive emergency [I16.1] ?Acute renal failure superimposed on stage 5 chronic kidney  disease, not on chronic dialysis, unspecified acute renal failure type ([place]) [N17.9, N18.5] ?Chronic kidney disease [N18.9] ?Patient Active Problem List  ? Diagnosis Date Noted  ? End of life care   ? Retroperitoneal bleed   ? Pressure injury of skin 12/25/2021  ? Chronic kidney disease 12/24/2021  ? Acute renal failure superimposed on stage 5 chronic kidney disease, not on chronic dialysis ([place])   ? Protein-calorie  malnutrition, severe 12/18/2021  ? Acute kidney insufficiency 12/18/2021  ? HTN (hypertension) 12/16/2021  ? Unstable angina pectoris ([place]) 07/14/2020  ? Mild protein-calorie malnutrition ([place]) 07/14/2020  ? Mild major depression ([place]) 01/10/2020  ? CKD (chronic kidney disease) stage 3, GFR 30-59 ml/min (HCC) 04/05/2019  ? Metabolic acidosis, normal anion gap (NAG) 07/27/2018  ? Dementia without behavioral disturbance ([place]) 07/27/2018  ? Glaucoma 07/27/2018  ? Secondary hyperparathyroidism ([place]) 11/26/2016  ? Elevated prostate specific antigen (PSA) 09/08/2015  ? Bone pain 06/13/2015  ? Spinal stenosis 06/13/2015  ? OP (osteoporosis) 03/20/2015  ? Allergic rhinitis 03/20/2015  ? Lumbar canal stenosis 03/20/2015  ? Vitamin D deficiency 03/20/2015  ? Restless legs syndrome 03/20/2015  ? Drug-induced gynecomastia 03/20/2015  ? Diaphragmatic hernia 03/20/2015  ? Acquired cyst of kidney 03/20/2015  ? Left ventricular hypertrophy by electrocardiogram 03/20/2015  ? Leg pain 03/20/2015  ? History of pneumonia 03/20/2015  ? Non-rheumatic tricuspid valve insufficiency 03/20/2015  ? Dysmetabolic syndrome 31/49/7026  ? Pulmonary hypertension ([place]) 10/31/2014  ? Benign essential HTN 07/29/2014  ? Dyslipidemia 07/29/2014  ? PVD (peripheral vascular disease) ([place])   ? Acute anemia   ? IBS (irritable bowel syndrome)   ? Bradycardia   ? ED (erectile dysfunction) of organic origin 06/29/2012  ? Genuine stress incontinence, male 06/29/2012  ? Malignant neoplasm of prostate ([place]) 06/29/2012  ? ?PCP:  Steele Sizer, MD ?Pharmacy:   ?[facility] [place] [address] [place] ?[place] ?[place] [address] ?Phone: [phone] Fax: [phone] ? ?[facility] - [place], [place] - [address] ?[address] ?[place] [address] ?Phone: [phone] Fax: [phone] ? ? ? ? ?Social Determinants of Health (SDOH) Interventions ?   ? ?Readmission Risk Interventions ?   ? View : No data to display.  ?  ?  ?  ? ? ? ?

## 2022-01-02 NOTE — Progress Notes (Signed)
FPTS Brief Note ?Reviewed patient's vitals, recent notes.  ?Vitals:  ? 01/02/22 0927 01/02/22 1626  ?BP: (!) 184/66 (!) 193/71  ?Pulse: (!) 57 61  ?Resp: 16 14  ?Temp: (!) 97.4 ?F (36.3 ?C) 97.6 ?F (36.4 ?C)  ?SpO2: 100% 100%  ? ?At this time, no change in plan from day progress note.  ?France Ravens, MD ?Page [phone] with questions about this patient.  ? ? ?

## 2022-01-02 NOTE — Progress Notes (Signed)
?                                                                                                                                                     ?                                                   ?Daily Progress Note  ? ?Patient Name: Patrick [name].       Date: 01/02/2022 ?DOB: 1932/06/01  Age: 88 y.o. MRN#: [id number] ?Attending Physician: McDiarmid, Blane Ohara, MD ?Primary Care Physician: Steele Sizer, MD ?Admit Date: 12/16/2021 ? ?Reason for Consultation/Follow-up: Non pain symptom management, Pain control, Psychosocial/spiritual support, and Terminal Care ? ?Subjective: ?Chart review performed.  Received report from primary RN -no acute concerns.  RN reports patient is confused and has good appetite. ? ?Went to visit patient at bedside -no family/visitors present.  Patient was lying in bed asleep -he does wake to voice/gentle touch.  He remains alert, oriented to self only, and unable to to make complex medical decisions.  No signs or non-verbal gestures of pain or discomfort noted. No respiratory distress, increased work of breathing, or secretions noted.  Patient denies pain or shortness of breath -he does tell me that he is thirsty and cup of ice water was provided that he drank without issue. ? ?Patient still with enough urine output that would not qualify him for [facility].  Appreciate TOC assistance with disposition. ? ?Length of Stay: 9 ? ?Current Medications: ?Scheduled Meds:  ? ? ?Continuous Infusions: ? ? ?PRN Meds: ?acetaminophen **OR** acetaminophen, antiseptic oral rinse, fentaNYL (SUBLIMAZE) injection, glycopyrrolate **OR** glycopyrrolate **OR** glycopyrrolate, haloperidol **OR** haloperidol **OR** haloperidol lactate, LORazepam **OR** LORazepam **OR** LORazepam, ondansetron **OR** ondansetron (ZOFRAN) IV, polyvinyl alcohol ? ?Physical Exam ?Vitals and nursing note reviewed.  ?Constitutional:   ?   General: He is not in acute distress. ?Pulmonary:  ?   Effort: No respiratory distress.   ?Skin: ?   General: Skin is warm and dry.  ?Neurological:  ?   Mental Status: He is alert. He is disoriented and confused.  ?   Motor: Weakness present.  ?Psychiatric:     ?   Attention and Perception: Attention normal.     ?   Behavior: Behavior is cooperative.     ?   Cognition and Memory: Cognition is impaired. Memory is impaired.  ?         ? ?Vital Signs: BP (!) 184/66   Pulse (!) 57   Temp (!) 97.4 ?F (36.3 ?C) (Oral)   Resp 16   Ht '6\' 2"'$  (1.88 m)   Wt 70.4 kg   SpO2 100%  BMI 19.93 kg/m?  ?SpO2: SpO2: 100 % ?O2 Device: O2 Device: Room Air ?O2 Flow Rate: O2 Flow Rate (L/min): 3 L/min ? ?Intake/output summary:  ?Intake/Output Summary (Last 24 hours) at 01/02/2022 1033 ?Last data filed at 01/02/2022 0600 ?Gross per 24 hour  ?Intake 240 ml  ?Output 900 ml  ?Net -660 ml  ? ?LBM: Last BM Date : 12/31/21 ?Baseline Weight: Weight: 67.6 kg ?Most recent weight: Weight: 70.4 kg ? ?     ?Palliative Assessment/Data: PPS 30% ? ? ? ? ? ?Patient Active Problem List  ? Diagnosis Date Noted  ? End of life care   ? Retroperitoneal bleed   ? Pressure injury of skin 12/25/2021  ? Chronic kidney disease 12/24/2021  ? Acute renal failure superimposed on stage 5 chronic kidney disease, not on chronic dialysis ([place])   ? Protein-calorie malnutrition, severe 12/18/2021  ? Acute kidney insufficiency 12/18/2021  ? HTN (hypertension) 12/16/2021  ? Unstable angina pectoris ([place]) 07/14/2020  ? Mild protein-calorie malnutrition ([place]) 07/14/2020  ? Mild major depression ([place]) 01/10/2020  ? CKD (chronic kidney disease) stage 3, GFR 30-59 ml/min (HCC) 04/05/2019  ? Metabolic acidosis, normal anion gap (NAG) 07/27/2018  ? Dementia without behavioral disturbance ([place]) 07/27/2018  ? Glaucoma 07/27/2018  ? Secondary hyperparathyroidism ([place]) 11/26/2016  ? Elevated prostate specific antigen (PSA) 09/08/2015  ? Bone pain 06/13/2015  ? Spinal stenosis 06/13/2015  ? OP (osteoporosis) 03/20/2015  ? Allergic rhinitis 03/20/2015  ? Lumbar canal  stenosis 03/20/2015  ? Vitamin D deficiency 03/20/2015  ? Restless legs syndrome 03/20/2015  ? Drug-induced gynecomastia 03/20/2015  ? Diaphragmatic hernia 03/20/2015  ? Acquired cyst of kidney 03/20/2015  ? Left ventricular hypertrophy by electrocardiogram 03/20/2015  ? Leg pain 03/20/2015  ? History of pneumonia 03/20/2015  ? Non-rheumatic tricuspid valve insufficiency 03/20/2015  ? Dysmetabolic syndrome 69/62/9528  ? Pulmonary hypertension ([place]) 10/31/2014  ? Benign essential HTN 07/29/2014  ? Dyslipidemia 07/29/2014  ? PVD (peripheral vascular disease) ([place])   ? Acute anemia   ? IBS (irritable bowel syndrome)   ? Bradycardia   ? ED (erectile dysfunction) of organic origin 06/29/2012  ? Genuine stress incontinence, male 06/29/2012  ? Malignant neoplasm of prostate ([place]) 06/29/2012  ? ? ?Palliative Care Assessment & Plan  ? ?Patient Profile: ?88 y.o. male  with past medical history of dementia, CKD stage IV, protein calorie malnutrition, history of prostate cancer, osteoporosis, hypertension, and hyperlipidemia who presented to the emergency department on 12/16/2021 with fall at home and unstable ambulation.  Found to have creatinine of 6.45.  Blood pressure in the ED 180-210/77-100.  He was admitted to [facility] with acute on chronic kidney failure and uncontrolled hypertension. ? ?Assessment: ?Principal Problem: ?  End of life care ?Active Problems: ?  Acute anemia ?  Pulmonary hypertension ([place]) ?  Dementia without behavioral disturbance ([place]) ?  HTN (hypertension) ?  Protein-calorie malnutrition, severe ?  Acute kidney insufficiency ?  Acute renal failure superimposed on stage 5 chronic kidney disease, not on chronic dialysis ([place]) ?  Chronic kidney disease ?  Pressure injury of skin ?  Retroperitoneal bleed ? Terminal care ? ?Recommendations/Plan: ?Continue full comfort measures ?Continue DNR/DNI as previously documented ?Appreciate TOC's assistance with disposition. Patient's oral intake and urine output remained  enough where he is still not [employer] eligible ?Only other option, though not ideal, may be discharging patient to rehab having outpatient palliative care to follow with goal of discharging from rehab home with hospice after wife's recovery,  versus residential hospice in the event of decline without rehospitalization ?Continue current comfort focused medication regimen, patient appears comfortable - no changes today ?PMT will continue to follow peripherally. If there are any imminent needs please call the service directly ? ?Symptom Management ?Fentanyl PRN pain/dyspnea ?Tylenol PRN pain/fever ?Biotin PRN dry mouth ?Robinul PRN secretions ?Haldol PRN agitation/delirium ?Ativan PRN anxiety/seizure/sleep/distress ?Zofran PRN nausea/vomiting ?Liquifilm Tears PRN dry eye ? ? ?Goals of Care and Additional Recommendations: ?Limitations on Scope of Treatment: Full Comfort Care ? ?Code Status: ? ?  ?Code Status Orders  ?(From admission, onward)  ?  ? ? ?  ? ?  Start     Ordered  ? 12/25/21 1527  Do not attempt resuscitation (DNR)  Continuous       ?Question Answer Comment  ?In the event of cardiac or respiratory ARREST Do not call a ?code blue?   ?In the event of cardiac or respiratory ARREST Do not perform Intubation, CPR, defibrillation or ACLS   ?In the event of cardiac or respiratory ARREST Use medication by any route, position, wound care, and other measures to relive pain and suffering. May use oxygen, suction and manual treatment of airway obstruction as needed for comfort.   ?  ? 12/25/21 1528  ? ?  ?  ? ?  ? ?Code Status History   ? ? Date Active Date Inactive Code Status Order ID Comments User Context  ? 12/16/2021 1742 12/25/2021 1528 DNR [id number]  Erskine Emery, MD ED  ? 07/27/2018 0514 07/30/2018 1530 Full Code [id number]  Etta Quill, DO ED  ? ?  ? ? ?Prognosis: ? Poor in the setting of advanced age, dementia, severe anemia, end stage CKD not a dialysis candidate, protein calorie malnutrition, and  multiple comorbidities ? ?Discharge Planning: ?To Be Determined ? ?Care plan was discussed with primary RN ? ?Thank you for allowing the Palliative Medicine Team to assist in the care of this patient. ? ?Lin Landsman,

## 2022-01-03 DIAGNOSIS — N179 Acute kidney failure, unspecified: Secondary | ICD-10-CM | POA: Diagnosis not present

## 2022-01-03 DIAGNOSIS — R58 Hemorrhage, not elsewhere classified: Secondary | ICD-10-CM | POA: Diagnosis not present

## 2022-01-03 DIAGNOSIS — E43 Unspecified severe protein-calorie malnutrition: Secondary | ICD-10-CM | POA: Diagnosis not present

## 2022-01-03 DIAGNOSIS — F0390 Unspecified dementia without behavioral disturbance: Secondary | ICD-10-CM | POA: Diagnosis not present

## 2022-01-03 NOTE — TOC Progression Note (Addendum)
Transition of Care (TOC) - Initial/Assessment Note  ? ? ?Patient Details  ?Name: Patrick [name]. ?MRN: [id number] ?Date of Birth: July 14, 1932 ? ?Transition of Care (TOC) CM/SW Contact:    ?Paulene Floor Demetruis Depaul, LCSWA ?Phone Number: ?01/03/2022, 8:13 AM ? ?Clinical Narrative:                 ?CSW notified by financial counseling that the patient can be screened for Medicaid and the patient's wife will be contacted to complete screening.   ? ?CSW contacted [facility] and [facility] and was informed that the agencies provide in home hospice care not residential.   ? ?CSW contacted [facility] leadership to inquire about the possibility of a Letter of Guarantee (LOG) since that the patient can potentially be approved for Medicaid.  CSW sent request. ? ?CSW spoke with [facility] and [facility].  Neither facility can accept  LOG's.  CSW contacted Shirlee Limerick with [facility] and was encouraged to fax the LOG request to the facilities [facility], [facility].    ? ?Pending: Medicaid and SNF bed ? ? ?Expected Discharge Plan: [place] ?Barriers to Discharge: Ship broker, SNF Pending bed offer ? ? ?Patient Goals and CMS Choice ?  ?CMS Medicare.gov Compare Post Acute Care list provided to:: Patient Represenative (must comment) ?Choice offered to / list presented to : Spouse ? ?Expected Discharge Plan and Services ?Expected Discharge Plan: [place] ?  ?  ?  ?Living arrangements for the past 2 months: Apartment ?                ?  ?  ?  ?  ?  ?  ?  ?  ?  ?  ? ?Prior Living Arrangements/Services ?Living arrangements for the past 2 months: Apartment ?Lives with:: Spouse ?Patient language and need for interpreter reviewed:: Yes ?       ?  ?Care giver support system in place?: Yes (comment) ?  ?Criminal Activity/Legal Involvement Pertinent to Current Situation/Hospitalization: No - Comment as needed ? ?Activities of Daily Living ?Home Assistive Devices/Equipment: None ?ADL Screening (condition at time of  admission) ?Patient's cognitive ability adequate to safely complete daily activities?: No ?Is the patient deaf or have difficulty hearing?: No ?Does the patient have difficulty seeing, even when wearing glasses/contacts?: No ?Does the patient have difficulty concentrating, remembering, or making decisions?: Yes ?Patient able to express need for assistance with ADLs?: Yes ?Does the patient have difficulty dressing or bathing?: Yes ?Independently performs ADLs?: No ?Communication: Independent ?Dressing (OT): Needs assistance ?Is this a change from baseline?: Pre-admission baseline ?Feeding: Needs assistance ?Is this a change from baseline?: Pre-admission baseline ?Toileting: Needs assistance ?Does the patient have difficulty walking or climbing stairs?: Yes ?Weakness of Legs: Both ?Weakness of Arms/Hands: Both ? ?Permission Sought/Granted ?  ?Permission granted to share information with : Yes, Verbal Permission Granted ?   ? Permission granted to share info w AGENCY: SNF per spouse ?   ?   ? ?Emotional Assessment ?  ?  ?  ?Orientation: : Oriented to Self ?Alcohol / Substance Use: Not Applicable ?Psych Involvement: No (comment) ? ?Admission diagnosis:  HTN (hypertension) [I10] ?Hypertensive emergency [I16.1] ?Acute renal failure superimposed on stage 5 chronic kidney disease, not on chronic dialysis, unspecified acute renal failure type ([place]) [N17.9, N18.5] ?Chronic kidney disease [N18.9] ?Patient Active Problem List  ? Diagnosis Date Noted  ? End of life care   ? Retroperitoneal bleed   ? Pressure injury of skin 12/25/2021  ?  Chronic kidney disease 12/24/2021  ? Acute renal failure superimposed on stage 5 chronic kidney disease, not on chronic dialysis ([place])   ? Protein-calorie malnutrition, severe 12/18/2021  ? Acute kidney insufficiency 12/18/2021  ? HTN (hypertension) 12/16/2021  ? Unstable angina pectoris ([place]) 07/14/2020  ? Mild protein-calorie malnutrition ([place]) 07/14/2020  ? Mild major depression ([place])  01/10/2020  ? CKD (chronic kidney disease) stage 3, GFR 30-59 ml/min (HCC) 04/05/2019  ? Metabolic acidosis, normal anion gap (NAG) 07/27/2018  ? Dementia without behavioral disturbance ([place]) 07/27/2018  ? Glaucoma 07/27/2018  ? Secondary hyperparathyroidism ([place]) 11/26/2016  ? Elevated prostate specific antigen (PSA) 09/08/2015  ? Bone pain 06/13/2015  ? Spinal stenosis 06/13/2015  ? OP (osteoporosis) 03/20/2015  ? Allergic rhinitis 03/20/2015  ? Lumbar canal stenosis 03/20/2015  ? Vitamin D deficiency 03/20/2015  ? Restless legs syndrome 03/20/2015  ? Drug-induced gynecomastia 03/20/2015  ? Diaphragmatic hernia 03/20/2015  ? Acquired cyst of kidney 03/20/2015  ? Left ventricular hypertrophy by electrocardiogram 03/20/2015  ? Leg pain 03/20/2015  ? History of pneumonia 03/20/2015  ? Non-rheumatic tricuspid valve insufficiency 03/20/2015  ? Dysmetabolic syndrome 68/07/5725  ? Pulmonary hypertension ([place]) 10/31/2014  ? Benign essential HTN 07/29/2014  ? Dyslipidemia 07/29/2014  ? PVD (peripheral vascular disease) ([place])   ? Acute anemia   ? IBS (irritable bowel syndrome)   ? Bradycardia   ? ED (erectile dysfunction) of organic origin 06/29/2012  ? Genuine stress incontinence, male 06/29/2012  ? Malignant neoplasm of prostate ([place]) 06/29/2012  ? ?PCP:  Steele Sizer, MD ?Pharmacy:   ?[facility] [place] [address] [place] ?[place] ?[place] [address] ?Phone: [phone] Fax: [phone] ? ?[facility] - [place], [place] - [address] ?[address] ?[place] [address] ?Phone: [phone] Fax: [phone] ? ? ? ? ?Social Determinants of Health (SDOH) Interventions ?  ? ?Readmission Risk Interventions ?   ? View : No data to display.  ?  ?  ?  ? ? ? ?

## 2022-01-03 NOTE — Progress Notes (Signed)
Family Medicine Teaching Service ?Daily Progress Note ?Intern Pager: [phone] ? ?Patient name: Patrick [name]. Medical record number: [id number] ?Date of birth: 1932/06/15 Age: 88 y.o. Gender: male ? ?Primary Care Provider: Steele Sizer, MD ?Consultants: Nephro, Palliative ?Code Status: DNR ? ?Pt Overview and Major Events to Date:  ?4/9- admitted ?4/18- made comfort care ?4/20- deemed note eligible for [facility] ? ?Assessment and Plan: ?Patrick [name]. is a 88 y.o. male who presented with frequent falls found to have AKI on CKD IV, course complicated by retroperitoneal bleed, now made full comfort care. PMHx significant for: HTN, CKD 4, dementia, glaucoma, HLD, prostate cancer, osteoporosis, protein calorie malnutrition, pulmonary HTN.  Patient is medically stable for placement. ? ?Comfort Care ?Remains with UOP that would disqualify him for [facility] Wife with recent back surgery, unable to care for at home. Difficult dispo due to inability to pay out of pocket for LTC and no acute rehab needs. Wife is applying for Medicaid. No PRNs overnight ?- TOC assisting with placement ?- Palliative following for symptom control ?- Comfort measures: haldol, ativan, robinul, fentanyl, Zofran ? ? ?FEN/GI: Regular ?PPx: None, comfort ?Dispo:SNF  with hospice . Barriers include placement.  ? ?Subjective:  ?Patient sleeping on arrival, awakens to voice, pleasant and with no complaints. ? ?Objective: ?Temp:  [97.4 ?F (36.3 ?C)-97.6 ?F (36.4 ?C)] 97.6 ?F (36.4 ?C) (04/26 1626) ?Pulse Rate:  [57-61] 61 (04/26 1626) ?Resp:  [14-16] 14 (04/26 1626) ?BP: (184-193)/(66-71) 193/71 (04/26 1626) ?SpO2:  [100 %] 100 % (04/26 1626) ?Physical Exam: ?General: Sleeping on arrival, awakens to voice, pleasantly demented ?Cardiovascular: Regular rate and rhythm, 2/6 systolic murmur ?Respiratory: Normal work of breathing on room air ?Abdomen: Nontender, nondistended ?Extremities: Stable bilateral pedal edema ? ?Laboratory: ?No new  labs ? ?Imaging/Diagnostic Tests: ?No new imaging, tests ? ?Patrick Gibson, MD ?01/03/2022, 7:56 AM ?PGY-1, [place] [facility] ?[place] Intern pager: [phone], text pages welcome ? ?

## 2022-01-04 DIAGNOSIS — R58 Hemorrhage, not elsewhere classified: Secondary | ICD-10-CM | POA: Diagnosis not present

## 2022-01-04 DIAGNOSIS — N179 Acute kidney failure, unspecified: Secondary | ICD-10-CM | POA: Diagnosis not present

## 2022-01-04 DIAGNOSIS — F0390 Unspecified dementia without behavioral disturbance: Secondary | ICD-10-CM | POA: Diagnosis not present

## 2022-01-04 DIAGNOSIS — E43 Unspecified severe protein-calorie malnutrition: Secondary | ICD-10-CM | POA: Diagnosis not present

## 2022-01-04 NOTE — Progress Notes (Signed)
Family Medicine Teaching Service ?Daily Progress Note ?Intern Pager: [phone] ? ?Patient name: Patrick [name]. Medical record number: [id number] ?Date of birth: 1932/03/04 Age: 88 y.o. Gender: male ? ?Primary Care Provider: Steele Sizer, MD ?Consultants: Nephro (s/o), Palliative ?Code Status: DNR ? ?Pt Overview and Major Events to Date:  ?4/9- admitted ?4/18- made comfort care ?4/20- deemed note eligible for [facility] ? ?Assessment and Plan: ?Patrick [name]. is a 88 y.o. male who presented with frequent falls found to have AKI on CKD IV, course complicated by retroperitoneal bleed, now made full comfort care. PMHx significant for: HTN, CKD 4, dementia, glaucoma, HLD, prostate cancer, osteoporosis, protein calorie malnutrition, pulmonary HTN.  Patient is medically stable for placement. ? ?Comfort Care ?Awaiting suitable dispo plan. Stable and comfortable. More tired this morning, no PRNs overnight.  ?- TOC assisting with placement, [facility] able to offer LOG to facilities as wife applies for Medicaid ?- Palliative following for symptom management ?- Comfort meds: haldol, ativan, robinul, fentanyl, Zofran ? ?FEN/GI: Regular ?PPx: None, comfort ?Dispo: Pending suitable dispo plan   ? ?Subjective:  ?Patrick [name] was awake this morning, somewhat weaker compared to previous.  Denies any complaints.  ? ?Objective: ?  ?Physical Exam: ?General: Frail and elderly, comfortable ?Cardiovascular: RRR ?Respiratory: Normal WOB on RA ?Abdomen: Non-tender, non-distended ?Extremities: Stable BL pedal edema ? ?Laboratory: ?No new labs ? ?Imaging/Diagnostic Tests: ?No new imaging, tests ? ?Eppie Gibson, MD ?01/04/2022, 9:18 AM ?PGY-1, [place] ?[place] Intern pager: [phone], text pages welcome ? ?

## 2022-01-04 NOTE — Progress Notes (Signed)
Manufacturing engineer [facility]) Hospital Liaison Note ?  ?[facility] continues to follow for discharge disposition.  ?  ?Please call with any questions or concerns.  ?  ?Thank you ? ?Roselee Nova, LCSW ?[place] Hospital Liaison  ?[phone] ?  ?

## 2022-01-04 NOTE — Progress Notes (Signed)
FPTS Brief Note ?Reviewed patient's vitals, recent notes.  ?Vitals:  ? 01/02/22 1626 01/03/22 0839  ?BP: (!) 193/71 (!) 149/65  ?Pulse: 61 (!) 58  ?Resp: 14 16  ?Temp: 97.6 ?F (36.4 ?C) 97.7 ?F (36.5 ?C)  ?SpO2: 100% 99%  ? ?At this time, no change in plan from day progress note.  ?Zola Button, MD ?Page [phone] with questions about this patient.  ?  ?

## 2022-01-05 DIAGNOSIS — Z515 Encounter for palliative care: Secondary | ICD-10-CM | POA: Diagnosis not present

## 2022-01-05 DIAGNOSIS — R58 Hemorrhage, not elsewhere classified: Secondary | ICD-10-CM | POA: Diagnosis not present

## 2022-01-05 DIAGNOSIS — F0390 Unspecified dementia without behavioral disturbance: Secondary | ICD-10-CM | POA: Diagnosis not present

## 2022-01-05 DIAGNOSIS — N179 Acute kidney failure, unspecified: Secondary | ICD-10-CM | POA: Diagnosis not present

## 2022-01-05 NOTE — Progress Notes (Signed)
Family Medicine Teaching Service ?Daily Progress Note ?Intern Pager: [phone] ? ?Patient name: Patrick [name]. Medical record number: [id number] ?Date of birth: [birth year] Age: 88 y.o. Gender: male ? ?Primary Care Provider: Steele Sizer, MD ?Consultants: nephrology, palliative care ?  Code Status: DNR ? ?Pt Overview and Major Events to Date:  ?4/9-admitted ?4/18-made comfort care ?4/20-deemed not eligible for inpatient hospice ? ?Assessment and Plan: ?Patrick [name]. is a 88 y.o. male who presents with frequent falls found to have AKI on CKD IV, course complicated by retroperitoneal bleed, now made full comfort care. PMHx significant for: HTN, CKD 4, dementia, glaucoma, HLD, prostate cancer, osteoporosis, protein calorie malnutrition, pulmonary HTN.  Patient is medically stable for placement. ? ?Comfort care ?- TOC assisting with placement ?- palliative care following ?- prn comfort meds per palliative ? ?FEN/GI: Regular ?PPx: None ? ?Disposition: Stable for discharge, dispo is pending ? ?Subjective:  ?No overnight events. ? ?Objective: ?Temp:  [97.8 ?F (36.6 ?C)] 97.8 ?F (36.6 ?C) (04/28 1205) ?Pulse Rate:  [49] 49 (04/28 1205) ?Resp:  [16] 16 (04/28 1205) ?BP: (214)/(73) 214/73 (04/28 1205) ?SpO2:  [99 %] 99 % (04/28 1205) ?Physical Exam: ?General: Sleeping but arouses easily, NAD ?Cardiovascular: RRR, 2/6 systolic murmur ?Respiratory: CTAB, breathing comfortably on room air ?Abdomen: soft, non-tender, +BS, +left abdominal bruit ?Extremities: WWP ? ?Laboratory: ?No results for input(s): WBC, HGB, HCT, PLT in the last 168 hours. ?No results for input(s): NA, K, CL, CO2, BUN, CREATININE, CALCIUM, PROT, BILITOT, ALKPHOS, ALT, AST, GLUCOSE in the last 168 hours. ? ?Invalid input(s): LABALBU ? ? ? ?Imaging/Diagnostic Tests: ?No recent imaging ? ?Zola Button, MD ?01/05/2022, 6:49 AM ?PGY-2, [place] ?[place] Intern pager: [phone], text pages welcome  ?

## 2022-01-06 DIAGNOSIS — F0390 Unspecified dementia without behavioral disturbance: Secondary | ICD-10-CM | POA: Diagnosis not present

## 2022-01-06 DIAGNOSIS — N179 Acute kidney failure, unspecified: Secondary | ICD-10-CM | POA: Diagnosis not present

## 2022-01-06 DIAGNOSIS — Z515 Encounter for palliative care: Secondary | ICD-10-CM | POA: Diagnosis not present

## 2022-01-06 DIAGNOSIS — E43 Unspecified severe protein-calorie malnutrition: Secondary | ICD-10-CM | POA: Diagnosis not present

## 2022-01-06 DIAGNOSIS — R58 Hemorrhage, not elsewhere classified: Secondary | ICD-10-CM | POA: Diagnosis not present

## 2022-01-06 DIAGNOSIS — I272 Pulmonary hypertension, unspecified: Secondary | ICD-10-CM | POA: Diagnosis not present

## 2022-01-06 NOTE — Progress Notes (Signed)
?                                                                                                                                                     ?                                                   ?Palliative Medicine Progress Note  ? ?Patient Name: Patrick [name].       Date: 01/06/2022 ?DOB: 27-Mar-1932  Age: 88 y.o. MRN#: [id number] ?Attending Physician: Zenia Resides, MD ?Primary Care Physician: Steele Sizer, MD ?Admit Date: 12/16/2021 ? ?Reason for Consultation/Follow-up: Symptom management, end-of-life care, disposition ? ?HPI/Patient Profile: ?88 y.o. male  with past medical history of dementia, CKD stage IV, protein calorie malnutrition, history of prostate cancer, osteoporosis, hypertension, and hyperlipidemia who presented to the emergency department on 12/16/2021 with fall at home and unstable ambulation.  Found to have creatinine of 6.45.  Blood pressure in the ED 180-210/77-100.  He was admitted to [place] with acute on chronic kidney failure and uncontrolled hypertension. ? ? ?Subjective: ?Patient is resting comfortably in bed.  I did not attempt to awaken him.  Note that lunch tray remains in the room with approximately 40% consumed.  His dinner tray remains in front of him untouched.  Overall his oral intake remains not decreased enough to be eligible for [facility]. ? ?I spoke with his wife Patrick [name] by phone. She is at home recovering from neck surgery 4/24. She tells me she has not regained use of her arms, and has been told it will take time.  ?Reviewed that Patrick [name] continues to receive comfort-focused care. Reviewed that he was not accepted at [facility] due to his oral intake had improved, but that they were still following him in case this changed. I shared with Patrick [name] that even though Patrick [name] was eating more, this was likely temporary and likely does not change his overall illness trajectory. Discussed that Medicaid approval is pending. Discussed that Patrick [name] can have the  addition of hospice support at the facility when placement is found. Reviewed that hospice would be extra care for patient, communication with family, and assistance with symptom management and care when he further declines.  ?Patrick [name] expresses appreciation for the phone call and update.  ? ? ?Objective: ? ?Physical Exam ?Vitals reviewed.  ?Constitutional:   ?   General: He is sleeping. He is not in acute distress. ?   Comments: Chronically ill-appearing ?Frail, elderly  ?Pulmonary:  ?   Effort: Pulmonary effort is normal.  ?         ? ?Vital Signs: BP (!) 198/75   Pulse (!) 56  Temp 98 ?F (36.7 ?C)   Resp 18   Ht '6\' 2"'$  (1.88 m)   Wt 70.4 kg   SpO2 99%   BMI 19.93 kg/m?  ?SpO2: SpO2: 99 % ?O2 Device: O2 Device: Room Air ? ? ?   ?Palliative Assessment/Data: PPS 20-30% ? ? ? ? ?Palliative Medicine Assessment & Plan  ? ?Assessment: ?Principal Problem: ?  Retroperitoneal bleed ?Active Problems: ?  Acute anemia ?  Pulmonary hypertension ([place]) ?  Dementia without behavioral disturbance ([place]) ?  HTN (hypertension) ?  Protein-calorie malnutrition, severe ?  Acute kidney insufficiency ?  Acute renal failure superimposed on stage 5 chronic kidney disease, not on chronic dialysis ([place]) ?  Chronic kidney disease ?  Pressure injury of skin ?  End of life care ?  ? ?Recommendations/Plan: ?Continue comfort measures ?DNR/DNI as previously documented ?Patient continues not to meet criteria for residential hospice facility ?If/when long-term placement is found, patient would benefit from the addition of hospice support at the facility ? ? ?Prognosis: ? < 3 months ? ?Discharge Planning: ?To Be Determined ? ? ? ?Thank you for allowing the Palliative Medicine Team to assist in the care of this patient. ? ? ?MDM - moderate ? ? ?Lavena Bullion, NP ? ? ?Please contact Palliative Medicine Team phone at [phone] for questions and concerns.  ?For individual providers, please see AMION. ? ? ? ? ? ?

## 2022-01-06 NOTE — Progress Notes (Signed)
FPTS Brief Note ?Reviewed patient's vitals, recent notes.  ?Vitals:  ? 01/05/22 0947 01/06/22 0357  ?BP: (!) 192/67 (!) 198/75  ?Pulse: (!) 51 (!) 56  ?Resp: 14 18  ?Temp: 98 ?F (36.7 ?C) 98 ?F (36.7 ?C)  ?SpO2: 99% 99%  ? ?At this time, no change in plan from day progress note.  ?Zola Button, MD ?Page [phone] with questions about this patient.  ?  ?

## 2022-01-06 NOTE — Progress Notes (Signed)
Family Medicine Teaching Service ?Daily Progress Note ?Intern Pager: [phone] ? ?Patient name: Patrick [name]. Medical record number: [id number] ?Date of birth: [birth year] Age: 88 y.o. Gender: male ? ?Primary Care Provider: Steele Sizer, MD ?Consultants: nephrology, palliative care ?Code Status: DNR ? ?Pt Overview and Major Events to Date:  ?4/9-admitted ?4/18-made comfort care ?4/20-deemed not eligible for inpatient hospice ? ?Assessment and Plan: ?Patrick [name]. is a 88 y.o. male who presents with frequent falls found to have AKI on CKD IV, course complicated by retroperitoneal bleed, now made full comfort care. PMHx significant for: HTN, CKD 4, dementia, glaucoma, HLD, prostate cancer, osteoporosis, protein calorie malnutrition, pulmonary HTN. ? ?Comfort Care  ?Patient is medically stable for discharge when placement is available.  ?- palliative following, appreciate assistance  ?- TOC following, appreciate assistance  ?- Comfort medications: Haldol, ativan, Fentanyl, Zofran, Tylenol, Robinul, Liquifilm tears, Antiseptic Oral Rinse ? ? ?FEN/GI: Regular  ?PPx: None ? ?Disposition: discharge when SNF placement with hospice is available ? ?Subjective:  ?No significant overnight events. Pt remains comfortable.  ? ?Objective: ?Temp:  [98 ?F (36.7 ?C)] 98 ?F (36.7 ?C) (04/30 0357) ?Pulse Rate:  [51-56] 56 (04/30 0357) ?Resp:  [14-18] 18 (04/30 0357) ?BP: (192-198)/(67-75) 198/75 (04/30 0357) ?SpO2:  [99 %] 99 % (04/30 0357) ?Physical Exam: ?General: sleeping, resting comfortably in bed  ?Respiratory: equal chest rise and fall  ? ? ?Laboratory: ?No results for input(s): WBC, HGB, HCT, PLT in the last 168 hours. ?No results for input(s): NA, K, CL, CO2, BUN, CREATININE, CALCIUM, PROT, BILITOT, ALKPHOS, ALT, AST, GLUCOSE in the last 168 hours. ? ?Invalid input(s): LABALBU ? ? ? ?Imaging/Diagnostic Tests: ?No results found. ? ? ?Patrick Hensen, DO ?01/06/2022, 6:39 AM ?PGY-3, [place]  ?[place]  Intern pager: [phone], text pages welcome ? ?

## 2022-01-07 DIAGNOSIS — N179 Acute kidney failure, unspecified: Secondary | ICD-10-CM | POA: Diagnosis not present

## 2022-01-07 DIAGNOSIS — E43 Unspecified severe protein-calorie malnutrition: Secondary | ICD-10-CM | POA: Diagnosis not present

## 2022-01-07 DIAGNOSIS — F0390 Unspecified dementia without behavioral disturbance: Secondary | ICD-10-CM | POA: Diagnosis not present

## 2022-01-07 DIAGNOSIS — R58 Hemorrhage, not elsewhere classified: Secondary | ICD-10-CM | POA: Diagnosis not present

## 2022-01-07 NOTE — TOC Progression Note (Signed)
Transition of Care (TOC) - Initial/Assessment Note  ? ? ?Patient Details  ?Name: Patrick [name]. ?MRN: [id number] ?Date of Birth: [birth year] ? ?Transition of Care (TOC) CM/SW Contact:    ?Paulene Floor Nyja [place]brook, LCSWA ?Phone Number: ?01/07/2022, 2:20 PM ? ?Clinical Narrative:                 ?CSW informed that the Medicaid process can begin when the patient's wife signs the paperwork.   ? ?Expected Discharge Plan: [place] ?Barriers to Discharge: Ship broker, SNF Pending bed offer ? ? ?Patient Goals and CMS Choice ?  ?CMS Medicare.gov Compare Post Acute Care list provided to:: Patient Represenative (must comment) ?Choice offered to / list presented to : Spouse ? ?Expected Discharge Plan and Services ?Expected Discharge Plan: [place] ?  ?  ?  ?Living arrangements for the past 2 months: Apartment ?                ?  ?  ?  ?  ?  ?  ?  ?  ?  ?  ? ?Prior Living Arrangements/Services ?Living arrangements for the past 2 months: Apartment ?Lives with:: Spouse ?Patient language and need for interpreter reviewed:: Yes ?       ?  ?Care giver support system in place?: Yes (comment) ?  ?Criminal Activity/Legal Involvement Pertinent to Current Situation/Hospitalization: No - Comment as needed ? ?Activities of Daily Living ?Home Assistive Devices/Equipment: None ?ADL Screening (condition at time of admission) ?Patient's cognitive ability adequate to safely complete daily activities?: No ?Is the patient deaf or have difficulty hearing?: No ?Does the patient have difficulty seeing, even when wearing glasses/contacts?: No ?Does the patient have difficulty concentrating, remembering, or making decisions?: Yes ?Patient able to express need for assistance with ADLs?: Yes ?Does the patient have difficulty dressing or bathing?: Yes ?Independently performs ADLs?: No ?Communication: Independent ?Dressing (OT): Needs assistance ?Is this a change from baseline?: Pre-admission baseline ?Feeding: Needs  assistance ?Is this a change from baseline?: Pre-admission baseline ?Toileting: Needs assistance ?Does the patient have difficulty walking or climbing stairs?: Yes ?Weakness of Legs: Both ?Weakness of Arms/Hands: Both ? ?Permission Sought/Granted ?  ?Permission granted to share information with : Yes, Verbal Permission Granted ?   ? Permission granted to share info w AGENCY: SNF per spouse ?   ?   ? ?Emotional Assessment ?  ?  ?  ?Orientation: : Oriented to Self ?Alcohol / Substance Use: Not Applicable ?Psych Involvement: No (comment) ? ?Admission diagnosis:  HTN (hypertension) [I10] ?Hypertensive emergency [I16.1] ?Acute renal failure superimposed on stage 5 chronic kidney disease, not on chronic dialysis, unspecified acute renal failure type ([place]) [N17.9, N18.5] ?Chronic kidney disease [N18.9] ?Patient Active Problem List  ? Diagnosis Date Noted  ? End of life care   ? Retroperitoneal bleed   ? Pressure injury of skin 12/25/2021  ? Chronic kidney disease 12/24/2021  ? Acute renal failure superimposed on stage 5 chronic kidney disease, not on chronic dialysis ([place])   ? Protein-calorie malnutrition, severe 12/18/2021  ? Acute kidney insufficiency 12/18/2021  ? HTN (hypertension) 12/16/2021  ? Unstable angina pectoris ([place]) 07/14/2020  ? Mild protein-calorie malnutrition ([place]) 07/14/2020  ? Mild major depression ([place]) 01/10/2020  ? CKD (chronic kidney disease) stage 3, GFR 30-59 ml/min (HCC) 04/05/2019  ? Metabolic acidosis, normal anion gap (NAG) 07/27/2018  ? Dementia without behavioral disturbance ([place]) 07/27/2018  ? Glaucoma 07/27/2018  ? Secondary hyperparathyroidism ([place]) 11/26/2016  ? Elevated prostate specific  antigen (PSA) 09/08/2015  ? Bone pain 06/13/2015  ? Spinal stenosis 06/13/2015  ? OP (osteoporosis) 03/20/2015  ? Allergic rhinitis 03/20/2015  ? Lumbar canal stenosis 03/20/2015  ? Vitamin D deficiency 03/20/2015  ? Restless legs syndrome 03/20/2015  ? Drug-induced gynecomastia 03/20/2015  ?  Diaphragmatic hernia 03/20/2015  ? Acquired cyst of kidney 03/20/2015  ? Left ventricular hypertrophy by electrocardiogram 03/20/2015  ? Leg pain 03/20/2015  ? History of pneumonia 03/20/2015  ? Non-rheumatic tricuspid valve insufficiency 03/20/2015  ? Dysmetabolic syndrome 29/47/6546  ? Pulmonary hypertension ([place]) 10/31/2014  ? Benign essential HTN 07/29/2014  ? Dyslipidemia 07/29/2014  ? PVD (peripheral vascular disease) ([place])   ? Acute anemia   ? IBS (irritable bowel syndrome)   ? Bradycardia   ? ED (erectile dysfunction) of organic origin 06/29/2012  ? Genuine stress incontinence, male 06/29/2012  ? Malignant neoplasm of prostate ([place]) 06/29/2012  ? ?PCP:  Steele Sizer, MD ?Pharmacy:   ?[facility] [place] [address] [place] ?[place] ?[place] [address] ?Phone: [phone] Fax: [phone] ? ?[facility] - [place], [place] - [address] ?[address] ?[place] [address] ?Phone: [phone] Fax: [phone] ? ? ? ? ?Social Determinants of Health (SDOH) Interventions ?  ? ?Readmission Risk Interventions ?   ? View : No data to display.  ?  ?  ?  ? ? ? ?

## 2022-01-07 NOTE — Progress Notes (Signed)
Family Medicine Teaching Service ?Daily Progress Note ?Intern Pager: [phone] ? ?Patient name: Patrick [name]. Medical record number: [id number] ?Date of birth: Aug 09, 1932 Age: 88 y.o. Gender: male ? ?Primary Care Provider: Steele Sizer, MD ?Consultants: Nephrology, Palliative care ?Code Status: DNR ? ?Pt Overview and Major Events to Date:  ?4/09- Admitted ?4/18- Made comfort care ?4/20- Not eligible for inpatient hospice ? ?Assessment and Plan: ?Patrick [name]. is a 88 y.o. male who presents with frequent falls found to have AKI on CKD IV, course complicated by retroperitoneal bleed, now made full comfort care. PMHx significant for: HTN, CKD 4, dementia, glaucoma, HLD, prostate cancer, osteoporosis, protein calorie malnutrition, pulmonary HTN. ? ?Comfort care ?Medically stable for discharge.  ?- Palliative folllowing, appreciate care ?- TOC following, appreciate care ?- Comfort medications: Patrick [name], Patrick [name], Patrick [name], Patrick [name], Patrick [name], Patrick [name], Patrick [name] ? ?FEN/GI: Regular ?PPx: None ?Dispo: Discharge when bed available at hospice facility ? ?Subjective:  ?Patrick [name] was eating his breakfast this morning when I saw him. He denied any pain anywhere and said he feels "A-okay." ? ?Objective: ?Temp:  [97.8 ?F (36.6 ?C)] 97.8 ?F (36.6 ?C) (05/01 0914) ?Pulse Rate:  [59] 59 (05/01 0914) ?Resp:  [16] 16 (05/01 0914) ?BP: (210)/(87) 210/87 (05/01 0914) ?SpO2:  [99 %] 99 % (05/01 0914) ?Physical Exam: ?General: Sitting upright in bed eating breakfast ?Cardiovascular: RRR, no murmurs ?Respiratory: CTAB, on room air with no increased work of breathing ?Abdomen: Soft, NTND, BS x4 ?Extremities: Warm, well-perfused ? ?Laboratory: ?No results for input(s): WBC, HGB, HCT, PLT in the last 168 hours. ?No results for input(s): NA, K, CL, CO2, BUN, CREATININE, CALCIUM, PROT, BILITOT, ALKPHOS, ALT, AST, GLUCOSE in the last 168 hours. ? ?Invalid input(s): LABALBU ? ? ? ?Imaging/Diagnostic Tests: ?No results  found. ? ? ?Patrick Brill, DO ?01/07/2022, 9:41 AM ?PGY-1, [place] ?[place] Intern pager: [phone], text pages welcome  ?

## 2022-01-08 DIAGNOSIS — R58 Hemorrhage, not elsewhere classified: Secondary | ICD-10-CM | POA: Diagnosis not present

## 2022-01-08 DIAGNOSIS — F0390 Unspecified dementia without behavioral disturbance: Secondary | ICD-10-CM | POA: Diagnosis not present

## 2022-01-08 DIAGNOSIS — N179 Acute kidney failure, unspecified: Secondary | ICD-10-CM | POA: Diagnosis not present

## 2022-01-08 DIAGNOSIS — Z515 Encounter for palliative care: Secondary | ICD-10-CM | POA: Diagnosis not present

## 2022-01-08 NOTE — Progress Notes (Signed)
CSW spoke with [place], LCSW of [facility] who states she visited the patient and was told he is still eating and having urine output which makes him ineligible for inpatient hospice at this time. ? ?Madilyn Fireman, MSW, LCSW ?Transitions of Care  Clinical Social Worker II ?[phone] ? ?

## 2022-01-08 NOTE — Progress Notes (Signed)
Manufacturing engineer [facility]) Hospital Liaison Note ? ?[place] liaison reevaluated patient for [facility] per [facility] request.  ? ?Visited with patient at bedside. Information sent to [facility] physician. Unfortunately, patient is not [facility] appropriate at this time.  ? ?Please call with any questions or concerns. Thank you ? ?Roselee Nova, LCSW ?[place] [facility] Liaison ?[phone] ?

## 2022-01-08 NOTE — Progress Notes (Signed)
Family Medicine Teaching Service ?Daily Progress Note ?Intern Pager: [phone] ? ?Patient name: Patrick [name]. Medical record number: [id number] ?Date of birth: 1931-10-10 Age: 88 y.o. Gender: male ? ?Primary Care Provider: Steele Sizer, MD ?Consultants: Nephrology, Palliative care ?Code Status: DNR ? ?Pt Overview and Major Events to Date:  ?4/9: Admitted ?3/18: Comfort care ?4/20: Not eligible for inpatient hospice ? ?Assessment and Plan: ?Patrick [name]. is a 88 y.o. male who presents with frequent falls found to have AKI on CKD IV, course complicated by retroperitoneal bleed, now made full comfort care. PMHx significant for: HTN, CKD 4, dementia, glaucoma, HLD, prostate cancer, osteoporosis, protein calorie malnutrition, pulmonary HTN. ? ?Comfort care ?Medically stable for discharge. Comfortable and without complaints this morning. ?- Palliative following, appreciate care ?- TOC following, appreciate care ?- Comfort medications: Haldol, Ativan, Zofran, Tylenol, Robinul, Liquiform tears, Antiseptic oral rinse ? ?FEN/GI: Regular ?PPx: None ?Dispo:Awaiting medicaid application process for placement at hospice facility ? ?Subjective:  ?Feeling well. No complaints. Comfortable and eating breakfast. ? ?Objective: ?Temp:  [97.5 ?F (36.4 ?C)-97.8 ?F (36.6 ?C)] 97.5 ?F (36.4 ?C) (05/02 0750) ?Pulse Rate:  [59-63] 63 (05/02 0750) ?Resp:  [14-16] 14 (05/02 0750) ?BP: (195-210)/(85-87) 195/85 (05/02 0750) ?SpO2:  [98 %-99 %] 98 % (05/02 0750) ?Physical Exam: ?General: Sitting upright in bed eating breakfast ?Cardiovascular: RRR, no murmurs ?Respiratory: CTAB on room air with no increased work of breathing ?Abdomen: Soft, NTND, BS x4 ?Extremities: Warm, well-perfused ? ?Laboratory: ?No results for input(s): WBC, HGB, HCT, PLT in the last 168 hours. ?No results for input(s): NA, K, CL, CO2, BUN, CREATININE, CALCIUM, PROT, BILITOT, ALKPHOS, ALT, AST, GLUCOSE in the last 168 hours. ? ?Invalid input(s):  LABALBU ? ? ?Imaging/Diagnostic Tests: ?No results found. ? ? ?Orvis Brill, DO ?01/08/2022, 8:52 AM ?PGY-1, [place] ?[place] Intern pager: [phone], text pages welcome  ?

## 2022-01-08 NOTE — Progress Notes (Signed)
FPTS Brief Note ?Reviewed patient's vitals, recent notes.  ?Vitals:  ? 01/06/22 0357 01/07/22 0914  ?BP: (!) 198/75 (!) 210/87  ?Pulse: (!) 56 (!) 59  ?Resp: 18 16  ?Temp: 98 ?F (36.7 ?C) 97.8 ?F (36.6 ?C)  ?SpO2: 99% 99%  ? ?At this time, no change in plan from day progress note.  ?Holley Bouche, MD ?Page [phone] with questions about this patient.  ? ? ?

## 2022-01-09 DIAGNOSIS — R58 Hemorrhage, not elsewhere classified: Secondary | ICD-10-CM | POA: Diagnosis not present

## 2022-01-09 DIAGNOSIS — Z515 Encounter for palliative care: Secondary | ICD-10-CM | POA: Diagnosis not present

## 2022-01-09 DIAGNOSIS — F0390 Unspecified dementia without behavioral disturbance: Secondary | ICD-10-CM | POA: Diagnosis not present

## 2022-01-09 DIAGNOSIS — N179 Acute kidney failure, unspecified: Secondary | ICD-10-CM | POA: Diagnosis not present

## 2022-01-09 LAB — BASIC METABOLIC PANEL
Anion gap: 6 (ref 5–15)
BUN: 106 mg/dL — ABNORMAL HIGH (ref 8–23)
CO2: 19 mmol/L — ABNORMAL LOW (ref 22–32)
Calcium: 10.1 mg/dL (ref 8.9–10.3)
Chloride: 120 mmol/L — ABNORMAL HIGH (ref 98–111)
Creatinine, Ser: 5.66 mg/dL — ABNORMAL HIGH (ref 0.61–1.24)
GFR, Estimated: 9 mL/min — ABNORMAL LOW (ref >60)
Glucose, Bld: 97 mg/dL (ref 70–99)
Potassium: 5.7 mmol/L — ABNORMAL HIGH (ref 3.5–5.1)
Sodium: 145 mmol/L (ref 135–145)

## 2022-01-09 NOTE — Progress Notes (Signed)
FPTS Brief Progress Note ? ?S: sleeping soundly ? ? ?O: ?BP (!) 216/93 (BP Location: Left Arm)   Pulse 66   Temp 98.4 ?F (36.9 ?C) (Oral)   Resp 18   Ht '6\' 2"'$  (1.88 m)   Wt 70.4 kg   SpO2 100%   BMI 19.93 kg/m?   ? ? ?A/P: ?- Orders reviewed. Labs for AM not ordered, which was adjusted as needed.  ?- Please see daily progress note for plan. Patient awaiting disposition. ? ?Gladys Damme, MD ?01/09/2022, 1:16 AM ?PGY-3, [facility] Night Resident  ?Please page [phone] with questions.  ? ? ?

## 2022-01-09 NOTE — Progress Notes (Addendum)
Family Medicine Teaching Service ?Daily Progress Note ?Intern Pager: [phone] ? ?Patient name: Patrick [name]. Medical record number: [id number] ?Date of birth: 06-May-1932 Age: 88 y.o. Gender: male ? ?Primary Care Provider: Steele Sizer, MD ?Consultants: Nephrology, Palliative Care ?Code Status: DNR ? ?Pt Overview and Major Events to Date:  ?4/9: Admitted ?3/18: Comfort care ? ?Assessment and Plan: ?Patrick [name]. is a 88 y.o. male who presents with frequent falls found to have AKI on CKD IV, course complicated by retroperitoneal bleed, now made full comfort care. PMHx significant for: HTN, CKD 4, dementia, glaucoma, HLD, prostate cancer, osteoporosis, protein calorie malnutrition, pulmonary HTN. ?  ?Comfort care ?Medically stable for discharge to hospice facility. His mentation is worsened this morning- only oriented to self. Usually eats breakfast, but did not this morning. Denies pain or complaints. ?- Palliative following, appreciate care ?- TOC following, appreciate care ?- Comfort medications: Haldol, Ativan, Zofran, Tylenol, Robinul, Liquiform tears, Antiseptic oral rinse ? ?FEN/GI: Regular ?PPx: None ?Dispo:Awaiting placement at hospice facility pending availability ? ?Subjective:  ?Pleasant, but more disoriented this morning. Only oriented to self. Says he is at a "church" when given options between hospital, church and office building. Can only follow some commands with hand grip but not raising legs. Repeatedly says "yes, yes, yes." ? ?Objective: ?Temp:  [97.6 ?F (36.4 ?C)-98.4 ?F (36.9 ?C)] 97.6 ?F (36.4 ?C) (05/03 0913) ?Pulse Rate:  [66-75] 75 (05/03 0913) ?Resp:  [16-19] 19 (05/03 0913) ?BP: (216-226)/(86-120) 226/120 (05/03 0913) ?SpO2:  [100 %] 100 % (05/03 0913) ?Physical Exam: ?General: Laying in bed, pleasant, comfortable ?Cardiovascular: RRR, no murmurs ?Respiratory: CTAB, on room air with no increased work of breathing ?Abdomen: Soft, NTND, BSx4 ?Extremities: Warm,  well-perfused ?Neuro: Alert to self. Cannot form full sentences. Can only follow some commands with prompting. ? ?Laboratory: ?No results for input(s): WBC, HGB, HCT, PLT in the last 168 hours. ?No results for input(s): NA, K, CL, CO2, BUN, CREATININE, CALCIUM, PROT, BILITOT, ALKPHOS, ALT, AST, GLUCOSE in the last 168 hours. ? ?Invalid input(s): LABALBU ? ?Imaging/Diagnostic Tests: ?No results found. ? ? ?Patrick Brill, DO ?01/09/2022, 9:48 AM ?PGY-1, [place] ?[place] Intern pager: [phone], text pages welcome ? ?

## 2022-01-09 NOTE — Progress Notes (Addendum)
Manufacturing engineer [facility]) Hospital Liaison Note ? ?[place] liaison reevaluated patient for [facility] per MD request.  ?  ?Visited with patient at bedside. Information sent to [facility] physician. ? ?Hospice eligibility confirmed.  ? ?Unfortunately, [facility] is unable to offer a room today. Hospital Liaison will follow up tomorrow or sooner if a room becomes available ?  ?Please call with any questions or concerns. Thank you ?  ?Roselee Nova, LCSW ?[facility] Liaison ?[phone] ?

## 2022-01-09 NOTE — TOC Progression Note (Addendum)
Transition of Care (TOC) - Progression Note  ? ? ?Patient Details  ?Name: Tay Whitwell. ?MRN: [id number] ?Date of Birth: 17-Nov-1931 ? ?Transition of Care (TOC) CM/SW Contact  ?Curlene Labrum, RN ?Phone Number: ?01/09/2022, 1:30 PM ? ?Clinical Narrative:    ?CM met with the patient at the bedside and patient was awake but disoriented to person, place, time a situation.  I spoke with Lonn Georgia, RN and she states that the patient's po intake is decreasing over the last 1-2 days.  He continues to have urine output noted through the external catheter that is dark yellow.   ? ?I spoke with Madilyn Fireman, MSW and Roselee Nova, MSW with [facility] was updated and continues to follow the patient for possible admission once patient meets qualifications for Inpatient admission.  The patient is unable to return home at this time - the patient was living with his elderly wife, Hades Mathew, that is recovering outside of the home and is physically unable to care for the patient in the home. ? ?[facility] continues to follow the patient for Inpatient admission to [facility] - unable to qualify for admission and transfer at this time. ? ? ?Expected Discharge Plan: [place] ?Barriers to Discharge: Ship broker, SNF Pending bed offer ? ?Expected Discharge Plan and Services ?Expected Discharge Plan: [place] ?  ?  ?  ?Living arrangements for the past 2 months: Apartment ?                ?  ?  ?  ?  ?  ?  ?  ?  ?  ?  ? ? ?Social Determinants of Health (SDOH) Interventions ?  ? ?Readmission Risk Interventions ?   ? View : No data to display.  ?  ?  ?  ? ? ?

## 2022-01-10 DIAGNOSIS — R58 Hemorrhage, not elsewhere classified: Secondary | ICD-10-CM | POA: Diagnosis not present

## 2022-01-10 DIAGNOSIS — Z515 Encounter for palliative care: Secondary | ICD-10-CM | POA: Diagnosis not present

## 2022-01-10 DIAGNOSIS — N179 Acute kidney failure, unspecified: Secondary | ICD-10-CM | POA: Diagnosis not present

## 2022-01-10 DIAGNOSIS — F0390 Unspecified dementia without behavioral disturbance: Secondary | ICD-10-CM | POA: Diagnosis not present

## 2022-01-10 MED ORDER — GLYCOPYRROLATE 1 MG PO TABS: 1.0000 mg | ORAL_TABLET | ORAL | 0 refills | Status: AC | PRN

## 2022-01-10 MED ORDER — LORAZEPAM 2 MG/ML PO CONC: 1.0000 mg | ORAL | 0 refills | Status: AC | PRN

## 2022-01-10 MED ORDER — ONDANSETRON 4 MG PO TBDP: 4.0000 mg | ORAL_TABLET | Freq: Four times a day (QID) | ORAL | 0 refills | Status: AC | PRN

## 2022-01-10 MED ORDER — HALOPERIDOL LACTATE 2 MG/ML PO CONC: 0.5000 mg | ORAL | 0 refills | Status: AC | PRN

## 2022-01-10 MED ORDER — LORAZEPAM 1 MG PO TABS
1.0000 mg | ORAL_TABLET | ORAL | 0 refills | Status: AC | PRN
Start: 2022-01-10 — End: ?

## 2022-01-10 MED ORDER — HALOPERIDOL LACTATE 5 MG/ML IJ SOLN
0.5000 mg | INTRAMUSCULAR | 0 refills | Status: AC | PRN
Start: 2022-01-10 — End: ?

## 2022-01-10 MED ORDER — FENTANYL CITRATE PF 50 MCG/ML IJ SOSY: 25.0000 ug | PREFILLED_SYRINGE | INTRAMUSCULAR | 0 refills | Status: AC | PRN

## 2022-01-10 MED ORDER — HALOPERIDOL 0.5 MG PO TABS: 0.5000 mg | ORAL_TABLET | ORAL | 0 refills | Status: AC | PRN

## 2022-01-10 MED ORDER — BIOTENE DRY MOUTH MT LIQD: 15.0000 mL | OROMUCOSAL | 0 refills | Status: AC | PRN

## 2022-01-10 MED ORDER — GLYCOPYRROLATE 0.2 MG/ML IJ SOLN: 0.2000 mg | INTRAMUSCULAR | 0 refills | Status: AC | PRN

## 2022-01-10 MED ORDER — GLYCOPYRROLATE 0.2 MG/ML IJ SOLN
0.2000 mg | INTRAMUSCULAR | 0 refills | Status: AC | PRN
Start: 2022-01-10 — End: ?

## 2022-01-10 MED ORDER — ONDANSETRON HCL 4 MG/2ML IJ SOLN: 4.0000 mg | Freq: Four times a day (QID) | INTRAMUSCULAR | 0 refills | Status: AC | PRN

## 2022-01-10 MED ORDER — POLYVINYL ALCOHOL 1.4 % OP SOLN: 1.0000 [drp] | Freq: Four times a day (QID) | OPHTHALMIC | 0 refills | Status: AC | PRN

## 2022-01-10 MED ORDER — LORAZEPAM 2 MG/ML IJ SOLN: 1.0000 mg | INTRAMUSCULAR | 0 refills | Status: AC | PRN

## 2022-01-10 NOTE — Discharge Summary (Addendum)
Family Medicine Teaching Service ?Hospital Discharge Summary ? ?Patient name: Patrick [name]. Medical record number: [id number] ?Date of birth: 01/09/1932 Age: 88 y.o. Gender: male ?Date of Admission: 12/16/2021  Date of Discharge: 01/10/2022 ?Admitting Physician: Lenoria Chime, MD ? ?Primary Care Provider: Steele Sizer, MD ?Consultants: Palliative Medicine, Nephrology ? ?Indication for Hospitalization: Falls ? ?Discharge Diagnoses/Problem List:  ?Principal Problem: ?  Retroperitoneal bleed ?Active Problems: ?  Acute anemia ?  Pulmonary hypertension ([place]) ?  Dementia without behavioral disturbance ([place]) ?  HTN (hypertension) ?  Protein-calorie malnutrition, severe ?  Acute kidney insufficiency ?  Acute renal failure superimposed on stage 5 chronic kidney disease, not on chronic dialysis ([place]) ?  Chronic kidney disease ?  Pressure injury of skin ?  End of life care ? ? ?Disposition: [place] ? ?Discharge Condition: Stable ? ?Discharge Exam:  ?Blood pressure (!) 196/80, pulse (!) 56, temperature 98.6 ?F (37 ?C), resp. rate 19, height '6\' 2"'$  (1.88 m), weight 70.4 kg, SpO2 99 %. ? ?General: Sleeping in bed, in no distress ?HEENT: Atraumatic, normocephalic. Tacky mucous membranes. Pupils PERRLA. ?CV: RRR ?Resp: Normal work of breathing on room air ?Abd: Soft, NTND ?Neuro: Sleeping. Only arousable to tactile stimulation ?Ext: Warm, well-perfused ? ?Brief Hospital Course:  ?Dandrae P Keilan Nichol. is a 88 y.o. male who was admitted to [place] at [facility] for difficulty with ambulation and falls at home.  Past medical history significant for hypertension, CKD, dementia, glaucoma, dyslipidemia, history of prostate cancer, osteoporosis, protein calorie malnutrition, anemia of chronic disease. Hospital course is outlined below (See H&P for more details).  ? ?Brief hospital course: ?Patient was initially admitted due to worsening instability with ambulation and recurrent falls. Acute work-up including head  imaging was negative and was felt to be multifactorial with dementia progression. An echocardiogram had also been obtained and did show pulmonary hypertension. His labs were significant for worsening renal function and was intermittently hyperkalemic requiring intermittent dosing of Lokelma. Nephrology evaluated the patient and patient was not a good candidate for HD. Wife was requesting full scope care at that time, palliative discussions were initiated with the persistent electrolyte issues present. On 4/17, patient's hemoglobin was down-trending and was not improving despite 3 Units of PRBC being transfused. GI was consulted with recommendations for abdominal imaging. CT abdomen/pelvis showed bilateral retroperitoneal hematomas involving the iliopsoas muscles, dilated pancreatic duct, and pulmonary nodules. Given the significant anemia and the retroperitoneal hematomas, the prognosis is poor. Palliative had further discussions with the family and the goal became comfort care.  ? ?Alzheimer's disease falls at home instability ?Wife presented with patient initially for complaint of instability with ambulation and falls at home that was worse than normal.  This is likely multifactorial in the setting of nutritional deficiency and Alzheimer's progression.  No evidence of focal neurologic deficits to indicate new onset stroke, CT head ruled out hemorrhage.  ? ?Goals of care protein calorie malnutrition ?Palliative consulted and there was a decision to pursue symptom management.  Wife was spoken to, and she understood that the patient has poor prognosis and ultimately decided on hospice care. ? ?Items for follow-up: ?Discharge to hospice ? ? ?Significant Procedures: None ? ?Significant Labs and Imaging:  ?No results for input(s): WBC, HGB, HCT, PLT in the last 168 hours. ?Recent Labs  ?Lab 01/09/22 ?1237  ?NA 145  ?K 5.7*  ?CL 120*  ?CO2 19*  ?GLUCOSE 97  ?BUN 106*  ?CREATININE 5.66*  ?CALCIUM 10.1   ? ? ?  Results/Tests Pending at Time of Discharge: None ? ?Discharge Medications:  ?Allergies as of 01/10/2022   ? ?   Reactions  ? Ace Inhibitors Cough  ? cough  ? Aricept [donepezil] Nausea And Vomiting  ? ?  ? ?  ?Medication List  ?  ? ?STOP taking these medications   ? ?amLODipine 10 MG tablet ?Commonly known as: NORVASC ?  ?aspirin 81 MG tablet ?  ?atorvastatin 40 MG tablet ?Commonly known as: LIPITOR ?  ?hydrALAZINE 25 MG tablet ?Commonly known as: APRESOLINE ?  ?vitamin C 500 MG tablet ?Commonly known as: ASCORBIC ACID ?  ?Vitamin E 400 units Chew ?  ? ?  ? ?TAKE these medications   ? ?antiseptic oral rinse Liqd ?Apply 15 mLs topically as needed for dry mouth. ?  ?fentaNYL 50 MCG/ML injection ?Commonly known as: SUBLIMAZE ?Inject 0.5-1 mLs (25-50 mcg total) into the vein every 2 (two) hours as needed for severe pain or moderate pain (or dyspnea). ?  ?glycopyrrolate 1 MG tablet ?Commonly known as: ROBINUL ?Take 1 tablet (1 mg total) by mouth every 4 (four) hours as needed (excessive secretions). ?  ?glycopyrrolate 0.2 MG/ML injection ?Commonly known as: ROBINUL ?Inject 1 mL (0.2 mg total) into the skin every 4 (four) hours as needed (excessive secretions). ?  ?glycopyrrolate 0.2 MG/ML injection ?Commonly known as: ROBINUL ?Inject 1 mL (0.2 mg total) into the vein every 4 (four) hours as needed (excessive secretions). ?  ?haloperidol 0.5 MG tablet ?Commonly known as: HALDOL ?Take 1 tablet (0.5 mg total) by mouth every 4 (four) hours as needed for agitation (or delirium). ?  ?haloperidol 2 MG/ML solution ?Commonly known as: HALDOL ?Place 0.3 mLs (0.6 mg total) under the tongue every 4 (four) hours as needed for agitation (or delirium). ?  ?haloperidol lactate 5 MG/ML injection ?Commonly known as: HALDOL ?Inject 0.1 mLs (0.5 mg total) into the vein every 4 (four) hours as needed (or delirium). ?  ?LORazepam 1 MG tablet ?Commonly known as: ATIVAN ?Take 1 tablet (1 mg total) by mouth every 4 (four) hours as needed for  anxiety. ?  ?LORazepam 2 MG/ML concentrated solution ?Commonly known as: ATIVAN ?Place 0.5 mLs (1 mg total) under the tongue every 4 (four) hours as needed for anxiety. ?  ?LORazepam 2 MG/ML injection ?Commonly known as: ATIVAN ?Inject 0.5 mLs (1 mg total) into the vein every 4 (four) hours as needed for anxiety. ?  ?ondansetron 4 MG disintegrating tablet ?Commonly known as: ZOFRAN-ODT ?Take 1 tablet (4 mg total) by mouth every 6 (six) hours as needed for nausea. ?  ?ondansetron 4 MG/2ML Soln injection ?Commonly known as: ZOFRAN ?Inject 2 mLs (4 mg total) into the vein every 6 (six) hours as needed for nausea. ?  ?polyvinyl alcohol 1.4 % ophthalmic solution ?Commonly known as: LIQUIFILM TEARS ?Place 1 drop into both eyes 4 (four) times daily as needed for dry eyes. ?  ? ?  ? ? ?Discharge Instructions: Please refer to Patient Instructions section of EMR for full details.  Patient was counseled important signs and symptoms that should prompt return to medical care, changes in medications, dietary instructions, activity restrictions, and follow up appointments.  ? ?Follow-Up Appointments: ? Follow-up Information   ? ? Steele Sizer, MD. Schedule an appointment as soon as possible for a visit in 1 week(s).   ?Specialty: Family Medicine ?Contact information: ?[address] ?[place] [address] ?[phone] ? ? ?  ?  ? ?  ?  ? ?  ? ? ?  Orvis Brill, DO ?01/10/2022, 11:32 AM ?PGY-1, [place] ? ?

## 2022-01-10 NOTE — Progress Notes (Signed)
Manufacturing engineer [facility]) Hospital Liaison Note ? ?Bed offered and accepted at [facility] for transfer today. Unit RN please call report to [phone] prior to patient leaving the unit. Please send signed DNR and paperwork with patient.  ? ?Please call with any questions or concerns. Thank you ? ?Roselee Nova, LCSW ?[facility] Liaison ?[phone] ?

## 2022-01-10 NOTE — Progress Notes (Addendum)
Family Medicine Teaching Service ?Daily Progress Note ?Intern Pager: [phone] ? ?Patient name: Patrick [name]. Medical record number: [id number] ?Date of birth: [birth year] Age: 88 y.o. Gender: male ? ?Primary Care Provider: Steele Sizer, MD ?Consultants: Nephrology, Palliative Care ?Code Status: DNR ? ?Pt Overview and Major Events to Date:  ?4/9: Admitted ?4/18: Comfort care ? ?Assessment and Plan: ?Patrick [name]. is a 88 y.o. male who presents with frequent falls found to have AKI on CKD IV, course complicated by retroperitoneal bleed, now made full comfort care. PMHx significant for: HTN, CKD 4, dementia, glaucoma, HLD, prostate cancer, osteoporosis, protein calorie malnutrition, pulmonary HTN. ? ?Comfort Care, Awaiting hospice placement ?Less alert today. Not eating or drinking much at all. Medically stable for discharge to hospice facility. Arousable to tactile stimulation, but not voice. Does not appear to be in discomfort or pain. ?- Palliative following, appreciate care ?- TOC following, appreciate care ?- Comfort medications: Haldol, Ativan, Zofran, Tylenol, Fentanyl, Robinul, Liquiform tears, Antisepctic oral rinse ? ?FEN/GI: Regular ?PPx: None ?Dispo:Awaiting placement at hospice facility pending bed availability ? ?Subjective:  ?Patient sleeping when I entered room. Does not wake to voice, but arousable with tactile stimulation but does not fully wake up to participate in speaking with me/examination. ? ?Objective: ?Temp:  [97.6 ?F (36.4 ?C)-98 ?F (36.7 ?C)] 98 ?F (36.7 ?C) (05/03 2043) ?Pulse Rate:  [75-78] 78 (05/03 2043) ?Resp:  [19-20] 20 (05/03 2043) ?BP: (226-231)/(120-163) 231/163 (05/03 2043) ?SpO2:  [100 %] 100 % (05/03 2043) ?Physical Exam: ?General: Sleeping in bed, comfortable ?Cardiovascular: RRR, no murmurs ?Respiratory: Clear in all fields, no increased work of breathing on room air ?Abdomen: Soft, NTND, BS x4 ?Extremities: Warm, well-perfused ?Neuro: Sleeping.   ? ?Laboratory: ?No results for input(s): WBC, HGB, HCT, PLT in the last 168 hours. ?Recent Labs  ?Lab 01/09/22 ?1237  ?NA 145  ?K 5.7*  ?CL 120*  ?CO2 19*  ?BUN 106*  ?CREATININE 5.66*  ?CALCIUM 10.1  ?GLUCOSE 97  ? ? ? ?Imaging/Diagnostic Tests: ?No results found. ? ? ?Orvis Brill, DO ?01/10/2022, 8:58 AM ?PGY-1, [place] ?[place] Intern pager: [phone], text pages welcome ? ?

## 2022-01-10 NOTE — Progress Notes (Signed)
FPTS Brief Note ?Reviewed patient's vitals, recent notes.  ?Vitals:  ? 01/09/22 0913 01/09/22 2043  ?BP: (!) 226/120 (!) 231/163  ?Pulse: 75 78  ?Resp: 19 20  ?Temp: 97.6 ?F (36.4 ?C) 98 ?F (36.7 ?C)  ?SpO2: 100% 100%  ? ?At this time, no change in plan from day progress note.  ?Gladys Damme, MD ?Page [phone] with questions about this patient.  ? ? ?

## 2022-01-10 NOTE — TOC Transition Note (Addendum)
Transition of Care (TOC) - CM/SW Discharge Note ? ? ?Patient Details  ?Name: Patrick [name]. ?MRN: [id number] ?Date of Birth: 11-Feb-1932 ? ?Transition of Care (TOC) CM/SW Contact:  ?Curlene Labrum, RN ?Phone Number: ?01/10/2022, 11:50 AM ? ? ?Clinical Narrative:    ?CM spoke with Roselee Nova, CM with [facility] and [name] has bed availability for admission to the facility today.  I called and spoke with the attending physician and the discharge orders and summary will be completed.  Ambulance transportation through [facility] will be set up for transportation to the facility once discharge paperwork is completed by the MD and admission paperwork is completed by the family with [employer] [facility].  DNR is present in the chart and will be transported with the patient. ? ?The patient's wife is aware and has been contacted by the MSW, Roselee Nova with [facility]. ? ?Bedside nursing can call report to [facility] at  [address], [place], [place] - [place]. ? ?CM and MSW with DTP team will continue to follow the patient for discharge planning to [facility] today. ? ?01/10/2022 - [facility] called and transportation set up for transportation to [facility].  Bedside nursing was notified.  I called the patient's wife and she is aware and the patient is being transported to the facility.   ? ?Ambulance packet is at the bedside to include DNR, medical necessity, AVS and discharge summary. ? ? ?Final next level of care: [place] ?Barriers to Discharge: No Barriers Identified ? ? ?Patient Goals and CMS Choice ?Patient states their goals for this hospitalization and ongoing recovery are:: Patient's wife is agreeable to Inpatient Hospice placement ?CMS Medicare.gov Compare Post Acute Care list provided to:: Patient Represenative (must comment) (Patient's spouse) ?Choice offered to / list presented to : Spouse ? ?Discharge Placement ?  ?           ?  ?  ?  ?  ? ?Discharge Plan and  Services ?In-house Referral: Clinical Social Work, Development worker, community, Hospice / [facility] ?Discharge Planning Services: CM Consult ?Post Acute Care Choice: Residential Hospice Bed          ?  ?  ?  ?  ?  ?  ?  ?  ?  ?  ? ?Social Determinants of Health (SDOH) Interventions ?  ? ? ?Readmission Risk Interventions ?   ? View : No data to display.  ?  ?  ?  ? ? ? ? ? ?

## 2022-01-10 NOTE — Care Management Important Message (Signed)
Important Message ? ?Patient Details  ?Name: Patrick [name]. ?MRN: [id number] ?Date of Birth: [birth year] ? ? ?Medicare Important Message Given:  Yes ? ?Patient seem confused signed copy for the IM left at the patient bedside.   ? ? ?Sanjith Siwek ?01/10/2022, 2:30 PM ?

## 2022-01-21 ENCOUNTER — Telehealth: Payer: Self-pay | Admitting: Family Medicine

## 2022-01-21 NOTE — Telephone Encounter (Signed)
Regina calling from [employer] is calling to report that that she will send over the death certificate via [place]  ? ?[phone] ?

## 2022-01-23 NOTE — Telephone Encounter (Unsigned)
Copied from [place] [phone]. Topic: General - Other ?>> Jan 23, 2022  2:17 PM Bayard Beaver wrote: ?Reason for CMK:[id number] called in from [facility] about getting death certificate signed, that was sent thru [facility] system for cremation ?

## 2022-02-07 NOTE — Telephone Encounter (Unsigned)
Copied from [place] [phone]. Topic: General - Deceased Patient ?>> 01/20/22 10:27 AM Robina Ade, Helene Kelp D wrote: ?Reason for CRM: Patient wife called to let Dr. Ancil Boozer know that patient pass away this morning. ? ?Route to department's PEC Pool. ?

## 2022-02-07 DEATH — deceased
